# Patient Record
Sex: Female | Born: 1980 | Race: White | Hispanic: No | Marital: Married | Smoking: Current every day smoker
Health system: Southern US, Community
[De-identification: ages and names within clinical notes are randomized; demographics above are authoritative.]

## PROBLEM LIST (undated history)

## (undated) DIAGNOSIS — J45909 Unspecified asthma, uncomplicated: Secondary | ICD-10-CM

## (undated) DIAGNOSIS — Z720 Tobacco use: Secondary | ICD-10-CM

## (undated) DIAGNOSIS — Z9889 Other specified postprocedural states: Secondary | ICD-10-CM

## (undated) DIAGNOSIS — Q796 Ehlers-Danlos syndrome, unspecified: Secondary | ICD-10-CM

## (undated) DIAGNOSIS — M1990 Unspecified osteoarthritis, unspecified site: Secondary | ICD-10-CM

## (undated) DIAGNOSIS — T4145XA Adverse effect of unspecified anesthetic, initial encounter: Secondary | ICD-10-CM

## (undated) DIAGNOSIS — K219 Gastro-esophageal reflux disease without esophagitis: Secondary | ICD-10-CM

## (undated) DIAGNOSIS — T8859XA Other complications of anesthesia, initial encounter: Secondary | ICD-10-CM

## (undated) DIAGNOSIS — K8020 Calculus of gallbladder without cholecystitis without obstruction: Secondary | ICD-10-CM

## (undated) DIAGNOSIS — N12 Tubulo-interstitial nephritis, not specified as acute or chronic: Secondary | ICD-10-CM

## (undated) DIAGNOSIS — F419 Anxiety disorder, unspecified: Secondary | ICD-10-CM

## (undated) DIAGNOSIS — N2889 Other specified disorders of kidney and ureter: Secondary | ICD-10-CM

## (undated) DIAGNOSIS — N200 Calculus of kidney: Secondary | ICD-10-CM

## (undated) DIAGNOSIS — R102 Pelvic and perineal pain unspecified side: Secondary | ICD-10-CM

## (undated) DIAGNOSIS — Z87442 Personal history of urinary calculi: Secondary | ICD-10-CM

## (undated) DIAGNOSIS — N10 Acute tubulo-interstitial nephritis: Secondary | ICD-10-CM

## (undated) DIAGNOSIS — R112 Nausea with vomiting, unspecified: Secondary | ICD-10-CM

## (undated) HISTORY — DX: Unspecified asthma, uncomplicated: J45.909

## (undated) HISTORY — DX: Tobacco use: Z72.0

## (undated) HISTORY — PX: TONSILLECTOMY: SUR1361

## (undated) HISTORY — DX: Ehlers-Danlos syndrome, unspecified: Q79.60

## (undated) HISTORY — PX: CHOLECYSTECTOMY, LAPAROSCOPIC: SHX56

## (undated) HISTORY — DX: Pelvic and perineal pain unspecified side: R10.20

## (undated) HISTORY — PX: LITHOTRIPSY: SUR834

## (undated) HISTORY — DX: Anxiety disorder, unspecified: F41.9

## (undated) HISTORY — DX: Pelvic and perineal pain: R10.2

## (undated) HISTORY — PX: ANKLE ARTHROSCOPY: SUR85

## (undated) HISTORY — DX: Tubulo-interstitial nephritis, not specified as acute or chronic: N12

## (undated) HISTORY — DX: Other specified disorders of kidney and ureter: N28.89

---

## 2004-02-26 HISTORY — PX: ABDOMINAL HYSTERECTOMY: SHX81

## 2005-05-14 ENCOUNTER — Emergency Department: Payer: Self-pay | Admitting: Emergency Medicine

## 2005-07-28 ENCOUNTER — Emergency Department: Payer: Self-pay | Admitting: Emergency Medicine

## 2005-08-01 ENCOUNTER — Emergency Department: Payer: Self-pay | Admitting: General Practice

## 2005-08-28 ENCOUNTER — Emergency Department: Payer: Self-pay | Admitting: Emergency Medicine

## 2005-10-15 ENCOUNTER — Emergency Department: Payer: Self-pay | Admitting: Emergency Medicine

## 2005-10-23 ENCOUNTER — Emergency Department: Payer: Self-pay | Admitting: Emergency Medicine

## 2005-12-26 ENCOUNTER — Emergency Department: Payer: Self-pay | Admitting: Emergency Medicine

## 2006-03-11 ENCOUNTER — Emergency Department: Payer: Self-pay | Admitting: Internal Medicine

## 2006-06-18 ENCOUNTER — Emergency Department: Payer: Self-pay | Admitting: Internal Medicine

## 2006-08-15 ENCOUNTER — Emergency Department: Payer: Self-pay

## 2007-03-10 ENCOUNTER — Emergency Department: Payer: Self-pay | Admitting: Emergency Medicine

## 2007-10-02 ENCOUNTER — Emergency Department: Payer: Self-pay | Admitting: Emergency Medicine

## 2007-10-09 ENCOUNTER — Emergency Department: Payer: Self-pay | Admitting: Emergency Medicine

## 2007-11-09 ENCOUNTER — Emergency Department: Payer: Self-pay | Admitting: Emergency Medicine

## 2007-11-19 ENCOUNTER — Emergency Department (HOSPITAL_COMMUNITY): Admission: EM | Admit: 2007-11-19 | Discharge: 2007-11-19 | Payer: Self-pay | Admitting: Emergency Medicine

## 2008-01-12 ENCOUNTER — Emergency Department: Payer: Self-pay | Admitting: Emergency Medicine

## 2008-03-08 ENCOUNTER — Ambulatory Visit: Payer: Self-pay | Admitting: Urology

## 2008-03-14 ENCOUNTER — Ambulatory Visit: Payer: Self-pay | Admitting: Urology

## 2008-03-17 ENCOUNTER — Ambulatory Visit: Payer: Self-pay | Admitting: Urology

## 2008-05-11 ENCOUNTER — Emergency Department: Payer: Self-pay | Admitting: Emergency Medicine

## 2008-05-18 ENCOUNTER — Ambulatory Visit: Payer: Self-pay | Admitting: Internal Medicine

## 2008-07-16 ENCOUNTER — Ambulatory Visit: Payer: Self-pay | Admitting: Neurology

## 2009-06-20 ENCOUNTER — Ambulatory Visit: Payer: Self-pay | Admitting: Family Medicine

## 2009-09-06 ENCOUNTER — Emergency Department (HOSPITAL_COMMUNITY): Admission: EM | Admit: 2009-09-06 | Discharge: 2009-09-06 | Payer: Self-pay | Admitting: Emergency Medicine

## 2009-09-09 ENCOUNTER — Emergency Department (HOSPITAL_COMMUNITY): Admission: EM | Admit: 2009-09-09 | Discharge: 2009-09-09 | Payer: Self-pay | Admitting: Emergency Medicine

## 2009-09-14 ENCOUNTER — Emergency Department (HOSPITAL_COMMUNITY): Admission: EM | Admit: 2009-09-14 | Discharge: 2009-09-14 | Payer: Self-pay | Admitting: Emergency Medicine

## 2009-09-28 ENCOUNTER — Emergency Department: Payer: Self-pay | Admitting: Internal Medicine

## 2009-11-18 ENCOUNTER — Emergency Department (HOSPITAL_COMMUNITY): Admission: EM | Admit: 2009-11-18 | Discharge: 2009-11-18 | Payer: Self-pay | Admitting: Emergency Medicine

## 2009-11-24 ENCOUNTER — Emergency Department (HOSPITAL_COMMUNITY): Admission: EM | Admit: 2009-11-24 | Discharge: 2009-11-24 | Payer: Self-pay | Admitting: Emergency Medicine

## 2009-11-27 ENCOUNTER — Ambulatory Visit (HOSPITAL_COMMUNITY): Admission: RE | Admit: 2009-11-27 | Discharge: 2009-11-27 | Payer: Self-pay | Admitting: Gastroenterology

## 2009-11-28 ENCOUNTER — Emergency Department (HOSPITAL_COMMUNITY): Admission: EM | Admit: 2009-11-28 | Discharge: 2009-11-28 | Payer: Self-pay | Admitting: Emergency Medicine

## 2009-12-24 ENCOUNTER — Emergency Department (HOSPITAL_COMMUNITY): Admission: EM | Admit: 2009-12-24 | Discharge: 2009-12-25 | Payer: Self-pay | Admitting: Emergency Medicine

## 2010-01-09 ENCOUNTER — Emergency Department: Payer: Self-pay | Admitting: Emergency Medicine

## 2010-01-09 ENCOUNTER — Emergency Department (HOSPITAL_COMMUNITY): Admission: EM | Admit: 2010-01-09 | Discharge: 2010-01-09 | Payer: Self-pay | Admitting: Emergency Medicine

## 2010-01-11 ENCOUNTER — Emergency Department (HOSPITAL_COMMUNITY): Admission: EM | Admit: 2010-01-11 | Discharge: 2010-01-11 | Payer: Self-pay | Admitting: Emergency Medicine

## 2010-01-25 ENCOUNTER — Emergency Department (HOSPITAL_COMMUNITY)
Admission: EM | Admit: 2010-01-25 | Discharge: 2010-01-25 | Payer: Self-pay | Source: Home / Self Care | Admitting: Emergency Medicine

## 2010-02-08 ENCOUNTER — Emergency Department (HOSPITAL_COMMUNITY)
Admission: EM | Admit: 2010-02-08 | Discharge: 2010-02-08 | Payer: Self-pay | Source: Home / Self Care | Admitting: Emergency Medicine

## 2010-03-09 ENCOUNTER — Emergency Department (HOSPITAL_COMMUNITY)
Admission: EM | Admit: 2010-03-09 | Discharge: 2010-03-09 | Payer: Self-pay | Source: Home / Self Care | Admitting: Emergency Medicine

## 2010-05-07 LAB — BASIC METABOLIC PANEL
BUN: 13 mg/dL (ref 6–23)
CO2: 22 mEq/L (ref 19–32)
Calcium: 9.1 mg/dL (ref 8.4–10.5)
Chloride: 118 mEq/L — ABNORMAL HIGH (ref 96–112)
Creatinine, Ser: 0.74 mg/dL (ref 0.4–1.2)
GFR calc Af Amer: >60 mL/min (ref >60)
GFR calc non Af Amer: >60 mL/min (ref >60)
Glucose, Bld: 84 mg/dL (ref 70–99)
Potassium: 3.8 mEq/L (ref 3.5–5.1)
Sodium: 144 mEq/L (ref 135–145)

## 2010-05-08 LAB — POCT I-STAT, CHEM 8
BUN: 6 mg/dL (ref 6–23)
Calcium, Ion: 1.17 mmol/L (ref 1.12–1.32)
Chloride: 109 mEq/L (ref 96–112)
Creatinine, Ser: 0.7 mg/dL (ref 0.4–1.2)
Glucose, Bld: 89 mg/dL (ref 70–99)
HCT: 39 % (ref 36.0–46.0)
Hemoglobin: 13.3 g/dL (ref 12.0–15.0)
Potassium: 3.6 mEq/L (ref 3.5–5.1)
Sodium: 142 mEq/L (ref 135–145)
TCO2: 22 mmol/L (ref 0–100)

## 2010-05-08 LAB — URINALYSIS, ROUTINE W REFLEX MICROSCOPIC
Bilirubin Urine: NEGATIVE
Glucose, UA: NEGATIVE mg/dL
Hgb urine dipstick: NEGATIVE
Ketones, ur: NEGATIVE mg/dL
Nitrite: NEGATIVE
Protein, ur: NEGATIVE mg/dL
Specific Gravity, Urine: 1.005 (ref 1.005–1.030)
Urobilinogen, UA: 0.2 mg/dL (ref 0.0–1.0)
pH: 7 (ref 5.0–8.0)

## 2010-05-08 LAB — POCT PREGNANCY, URINE: Preg Test, Ur: NEGATIVE

## 2010-05-10 LAB — CBC
HCT: 38.8 % (ref 36.0–46.0)
HCT: 40.5 % (ref 36.0–46.0)
HCT: 40.9 % (ref 36.0–46.0)
Hemoglobin: 13.1 g/dL (ref 12.0–15.0)
Hemoglobin: 13.4 g/dL (ref 12.0–15.0)
Hemoglobin: 13.7 g/dL (ref 12.0–15.0)
MCH: 31.2 pg (ref 26.0–34.0)
MCH: 31.6 pg (ref 26.0–34.0)
MCH: 31.8 pg (ref 26.0–34.0)
MCHC: 33.1 g/dL (ref 30.0–36.0)
MCHC: 33.5 g/dL (ref 30.0–36.0)
MCHC: 33.8 g/dL (ref 30.0–36.0)
MCV: 93.7 fL (ref 78.0–100.0)
MCV: 94.2 fL (ref 78.0–100.0)
MCV: 94.9 fL (ref 78.0–100.0)
Platelets: 178 10*3/uL (ref 150–400)
Platelets: 185 10*3/uL (ref 150–400)
Platelets: 194 10*3/uL (ref 150–400)
RBC: 4.14 MIL/uL (ref 3.87–5.11)
RBC: 4.3 MIL/uL (ref 3.87–5.11)
RBC: 4.31 MIL/uL (ref 3.87–5.11)
RDW: 12.8 % (ref 11.5–15.5)
RDW: 12.8 % (ref 11.5–15.5)
RDW: 12.9 % (ref 11.5–15.5)
WBC: 6.1 10*3/uL (ref 4.0–10.5)
WBC: 6.5 10*3/uL (ref 4.0–10.5)
WBC: 7.1 10*3/uL (ref 4.0–10.5)

## 2010-05-10 LAB — COMPREHENSIVE METABOLIC PANEL
ALT: 13 U/L (ref 0–35)
ALT: 14 U/L (ref 0–35)
ALT: 15 U/L (ref 0–35)
AST: 17 U/L (ref 0–37)
AST: 21 U/L (ref 0–37)
AST: 24 U/L (ref 0–37)
Albumin: 3.9 g/dL (ref 3.5–5.2)
Albumin: 3.9 g/dL (ref 3.5–5.2)
Albumin: 4.1 g/dL (ref 3.5–5.2)
Alkaline Phosphatase: 55 U/L (ref 39–117)
Alkaline Phosphatase: 63 U/L (ref 39–117)
Alkaline Phosphatase: 65 U/L (ref 39–117)
BUN: 10 mg/dL (ref 6–23)
BUN: 11 mg/dL (ref 6–23)
BUN: 13 mg/dL (ref 6–23)
CO2: 20 mEq/L (ref 19–32)
CO2: 22 mEq/L (ref 19–32)
CO2: 23 mEq/L (ref 19–32)
Calcium: 8.9 mg/dL (ref 8.4–10.5)
Calcium: 9.4 mg/dL (ref 8.4–10.5)
Calcium: 9.6 mg/dL (ref 8.4–10.5)
Chloride: 113 mEq/L — ABNORMAL HIGH (ref 96–112)
Chloride: 115 mEq/L — ABNORMAL HIGH (ref 96–112)
Chloride: 116 mEq/L — ABNORMAL HIGH (ref 96–112)
Creatinine, Ser: 0.66 mg/dL (ref 0.4–1.2)
Creatinine, Ser: 0.7 mg/dL (ref 0.4–1.2)
Creatinine, Ser: 0.74 mg/dL (ref 0.4–1.2)
GFR calc Af Amer: >60 mL/min (ref >60)
GFR calc Af Amer: >60 mL/min (ref >60)
GFR calc Af Amer: >60 mL/min (ref >60)
GFR calc non Af Amer: >60 mL/min (ref >60)
GFR calc non Af Amer: >60 mL/min (ref >60)
GFR calc non Af Amer: >60 mL/min (ref >60)
Glucose, Bld: 72 mg/dL (ref 70–99)
Glucose, Bld: 75 mg/dL (ref 70–99)
Glucose, Bld: 84 mg/dL (ref 70–99)
Potassium: 3.5 mEq/L (ref 3.5–5.1)
Potassium: 3.8 mEq/L (ref 3.5–5.1)
Potassium: 3.9 mEq/L (ref 3.5–5.1)
Sodium: 139 mEq/L (ref 135–145)
Sodium: 140 mEq/L (ref 135–145)
Sodium: 142 mEq/L (ref 135–145)
Total Bilirubin: 0.2 mg/dL — ABNORMAL LOW (ref 0.3–1.2)
Total Bilirubin: 0.3 mg/dL (ref 0.3–1.2)
Total Bilirubin: 0.5 mg/dL (ref 0.3–1.2)
Total Protein: 6.9 g/dL (ref 6.0–8.3)
Total Protein: 7 g/dL (ref 6.0–8.3)
Total Protein: 7.2 g/dL (ref 6.0–8.3)

## 2010-05-10 LAB — DIFFERENTIAL
Basophils Absolute: 0.1 10*3/uL (ref 0.0–0.1)
Basophils Absolute: 0.1 10*3/uL (ref 0.0–0.1)
Basophils Absolute: 0.1 10*3/uL (ref 0.0–0.1)
Basophils Relative: 1 % (ref 0–1)
Basophils Relative: 1 % (ref 0–1)
Basophils Relative: 1 % (ref 0–1)
Eosinophils Absolute: 0.1 10*3/uL (ref 0.0–0.7)
Eosinophils Absolute: 0.1 10*3/uL (ref 0.0–0.7)
Eosinophils Absolute: 0.1 10*3/uL (ref 0.0–0.7)
Eosinophils Relative: 2 % (ref 0–5)
Eosinophils Relative: 2 % (ref 0–5)
Eosinophils Relative: 2 % (ref 0–5)
Lymphocytes Relative: 31 % (ref 12–46)
Lymphocytes Relative: 34 % (ref 12–46)
Lymphocytes Relative: 37 % (ref 12–46)
Lymphs Abs: 1.9 10*3/uL (ref 0.7–4.0)
Lymphs Abs: 2.4 10*3/uL (ref 0.7–4.0)
Lymphs Abs: 2.4 10*3/uL (ref 0.7–4.0)
Monocytes Absolute: 0.4 10*3/uL (ref 0.1–1.0)
Monocytes Absolute: 0.5 10*3/uL (ref 0.1–1.0)
Monocytes Absolute: 0.5 10*3/uL (ref 0.1–1.0)
Monocytes Relative: 6 % (ref 3–12)
Monocytes Relative: 7 % (ref 3–12)
Monocytes Relative: 7 % (ref 3–12)
Neutro Abs: 3.5 10*3/uL (ref 1.7–7.7)
Neutro Abs: 3.7 10*3/uL (ref 1.7–7.7)
Neutro Abs: 4 10*3/uL (ref 1.7–7.7)
Neutrophils Relative %: 53 % (ref 43–77)
Neutrophils Relative %: 57 % (ref 43–77)
Neutrophils Relative %: 61 % (ref 43–77)

## 2010-05-10 LAB — URINALYSIS, ROUTINE W REFLEX MICROSCOPIC
Bilirubin Urine: NEGATIVE
Bilirubin Urine: NEGATIVE
Glucose, UA: NEGATIVE mg/dL
Glucose, UA: NEGATIVE mg/dL
Hgb urine dipstick: NEGATIVE
Ketones, ur: NEGATIVE mg/dL
Ketones, ur: NEGATIVE mg/dL
Leukocytes, UA: NEGATIVE
Nitrite: NEGATIVE
Nitrite: NEGATIVE
Protein, ur: NEGATIVE mg/dL
Protein, ur: NEGATIVE mg/dL
Specific Gravity, Urine: 1.008 (ref 1.005–1.030)
Specific Gravity, Urine: 1.015 (ref 1.005–1.030)
Urobilinogen, UA: 0.2 mg/dL (ref 0.0–1.0)
Urobilinogen, UA: 1 mg/dL (ref 0.0–1.0)
pH: 7 (ref 5.0–8.0)
pH: 7.5 (ref 5.0–8.0)

## 2010-05-10 LAB — URINE MICROSCOPIC-ADD ON

## 2010-05-10 LAB — LIPASE, BLOOD
Lipase: 30 U/L (ref 11–59)
Lipase: 34 U/L (ref 11–59)

## 2010-05-10 LAB — POCT PREGNANCY, URINE: Preg Test, Ur: NEGATIVE

## 2010-06-10 ENCOUNTER — Emergency Department (HOSPITAL_COMMUNITY): Payer: BC Managed Care – HMO

## 2010-06-10 ENCOUNTER — Emergency Department (HOSPITAL_COMMUNITY)
Admission: EM | Admit: 2010-06-10 | Discharge: 2010-06-11 | Disposition: A | Discharge status: Home / Self Care | Payer: BC Managed Care – HMO | Attending: Emergency Medicine | Admitting: Emergency Medicine

## 2010-06-10 DIAGNOSIS — R110 Nausea: Secondary | ICD-10-CM | POA: Insufficient documentation

## 2010-06-10 DIAGNOSIS — J45909 Unspecified asthma, uncomplicated: Secondary | ICD-10-CM | POA: Insufficient documentation

## 2010-06-10 DIAGNOSIS — N201 Calculus of ureter: Secondary | ICD-10-CM | POA: Insufficient documentation

## 2010-06-10 DIAGNOSIS — R319 Hematuria, unspecified: Secondary | ICD-10-CM | POA: Insufficient documentation

## 2010-06-10 DIAGNOSIS — R109 Unspecified abdominal pain: Secondary | ICD-10-CM | POA: Insufficient documentation

## 2010-06-10 LAB — URINALYSIS, ROUTINE W REFLEX MICROSCOPIC
Glucose, UA: NEGATIVE mg/dL
Ketones, ur: 15 mg/dL — AB
Nitrite: NEGATIVE
Protein, ur: 100 mg/dL — AB
Specific Gravity, Urine: 1.024 (ref 1.005–1.030)
Urobilinogen, UA: 0.2 mg/dL (ref 0.0–1.0)
pH: 5 (ref 5.0–8.0)

## 2010-06-10 LAB — POCT I-STAT, CHEM 8
BUN: 11 mg/dL (ref 6–23)
Calcium, Ion: 1.26 mmol/L (ref 1.12–1.32)
Chloride: 109 mEq/L (ref 96–112)
Creatinine, Ser: 0.9 mg/dL (ref 0.4–1.2)
Glucose, Bld: 85 mg/dL (ref 70–99)
HCT: 41 % (ref 36.0–46.0)
Hemoglobin: 13.9 g/dL (ref 12.0–15.0)
Potassium: 3.5 mEq/L (ref 3.5–5.1)
Sodium: 141 mEq/L (ref 135–145)
TCO2: 22 mmol/L (ref 0–100)

## 2010-06-10 LAB — URINE MICROSCOPIC-ADD ON

## 2010-06-14 ENCOUNTER — Emergency Department (HOSPITAL_COMMUNITY)
Admission: EM | Admit: 2010-06-14 | Discharge: 2010-06-15 | Disposition: A | Discharge status: Other Acute Inpatient Hospital | Payer: BC Managed Care – HMO | Attending: Emergency Medicine | Admitting: Emergency Medicine

## 2010-06-14 DIAGNOSIS — N200 Calculus of kidney: Secondary | ICD-10-CM | POA: Insufficient documentation

## 2010-06-14 DIAGNOSIS — R110 Nausea: Secondary | ICD-10-CM | POA: Insufficient documentation

## 2010-06-14 DIAGNOSIS — Z79899 Other long term (current) drug therapy: Secondary | ICD-10-CM | POA: Insufficient documentation

## 2010-06-14 DIAGNOSIS — R109 Unspecified abdominal pain: Secondary | ICD-10-CM | POA: Insufficient documentation

## 2010-06-14 DIAGNOSIS — J45909 Unspecified asthma, uncomplicated: Secondary | ICD-10-CM | POA: Insufficient documentation

## 2010-06-14 LAB — URINALYSIS, ROUTINE W REFLEX MICROSCOPIC
Bilirubin Urine: NEGATIVE
Glucose, UA: NEGATIVE mg/dL
Ketones, ur: NEGATIVE mg/dL
Leukocytes, UA: NEGATIVE
Nitrite: NEGATIVE
Protein, ur: NEGATIVE mg/dL
Specific Gravity, Urine: 1.013 (ref 1.005–1.030)
Urobilinogen, UA: 1 mg/dL (ref 0.0–1.0)
pH: 8.5 — ABNORMAL HIGH (ref 5.0–8.0)

## 2010-06-14 LAB — POCT PREGNANCY, URINE: Preg Test, Ur: NEGATIVE

## 2010-06-14 LAB — URINE MICROSCOPIC-ADD ON

## 2010-06-15 ENCOUNTER — Inpatient Hospital Stay (HOSPITAL_COMMUNITY): Payer: BC Managed Care – HMO

## 2010-06-15 ENCOUNTER — Inpatient Hospital Stay (HOSPITAL_COMMUNITY)
Admission: AD | Admit: 2010-06-15 | Discharge: 2010-06-16 | DRG: 324 | Disposition: A | Discharge status: Home / Self Care | Payer: BC Managed Care – HMO | Source: Other Acute Inpatient Hospital | Attending: Urology | Admitting: Urology

## 2010-06-15 DIAGNOSIS — J45909 Unspecified asthma, uncomplicated: Secondary | ICD-10-CM | POA: Diagnosis present

## 2010-06-15 DIAGNOSIS — Z87442 Personal history of urinary calculi: Secondary | ICD-10-CM

## 2010-06-15 DIAGNOSIS — F411 Generalized anxiety disorder: Secondary | ICD-10-CM | POA: Diagnosis present

## 2010-06-15 DIAGNOSIS — N201 Calculus of ureter: Principal | ICD-10-CM | POA: Diagnosis present

## 2010-06-15 DIAGNOSIS — N200 Calculus of kidney: Secondary | ICD-10-CM | POA: Diagnosis present

## 2010-06-15 LAB — URINE MICROSCOPIC-ADD ON

## 2010-06-15 LAB — URINALYSIS, ROUTINE W REFLEX MICROSCOPIC
Bilirubin Urine: NEGATIVE
Glucose, UA: NEGATIVE mg/dL
Ketones, ur: NEGATIVE mg/dL
Leukocytes, UA: NEGATIVE
Nitrite: NEGATIVE
Protein, ur: NEGATIVE mg/dL
Specific Gravity, Urine: 1.006 (ref 1.005–1.030)
Urobilinogen, UA: 1 mg/dL (ref 0.0–1.0)
pH: 7 (ref 5.0–8.0)

## 2010-06-15 LAB — CBC
HCT: 37.3 % (ref 36.0–46.0)
Hemoglobin: 12.4 g/dL (ref 12.0–15.0)
MCH: 31.2 pg (ref 26.0–34.0)
MCHC: 33.2 g/dL (ref 30.0–36.0)
MCV: 93.7 fL (ref 78.0–100.0)
Platelets: 196 10*3/uL (ref 150–400)
RBC: 3.98 MIL/uL (ref 3.87–5.11)
RDW: 12.5 % (ref 11.5–15.5)
WBC: 7.7 10*3/uL (ref 4.0–10.5)

## 2010-06-15 LAB — BASIC METABOLIC PANEL
BUN: 6 mg/dL (ref 6–23)
CO2: 24 mEq/L (ref 19–32)
Calcium: 8.6 mg/dL (ref 8.4–10.5)
Chloride: 114 mEq/L — ABNORMAL HIGH (ref 96–112)
Creatinine, Ser: 0.71 mg/dL (ref 0.4–1.2)
GFR calc Af Amer: >60 mL/min (ref >60)
GFR calc non Af Amer: >60 mL/min (ref >60)
Glucose, Bld: 84 mg/dL (ref 70–99)
Potassium: 3.9 mEq/L (ref 3.5–5.1)
Sodium: 143 mEq/L (ref 135–145)

## 2010-06-15 LAB — SURGICAL PCR SCREEN
MRSA, PCR: NEGATIVE
Staphylococcus aureus: NEGATIVE

## 2010-06-21 ENCOUNTER — Emergency Department (HOSPITAL_COMMUNITY): Payer: BC Managed Care – PPO

## 2010-06-21 ENCOUNTER — Emergency Department (HOSPITAL_COMMUNITY)
Admission: EM | Admit: 2010-06-21 | Discharge: 2010-06-21 | Disposition: A | Discharge status: Home / Self Care | Payer: BC Managed Care – PPO | Attending: Emergency Medicine | Admitting: Emergency Medicine

## 2010-06-21 DIAGNOSIS — R109 Unspecified abdominal pain: Secondary | ICD-10-CM | POA: Insufficient documentation

## 2010-06-21 DIAGNOSIS — N390 Urinary tract infection, site not specified: Secondary | ICD-10-CM | POA: Insufficient documentation

## 2010-06-21 DIAGNOSIS — N200 Calculus of kidney: Secondary | ICD-10-CM | POA: Insufficient documentation

## 2010-06-21 DIAGNOSIS — Z9889 Other specified postprocedural states: Secondary | ICD-10-CM | POA: Insufficient documentation

## 2010-06-21 DIAGNOSIS — N201 Calculus of ureter: Secondary | ICD-10-CM | POA: Insufficient documentation

## 2010-06-21 LAB — URINALYSIS, ROUTINE W REFLEX MICROSCOPIC
Bilirubin Urine: NEGATIVE
Glucose, UA: NEGATIVE mg/dL
Nitrite: POSITIVE — AB
Protein, ur: 100 mg/dL — AB
Specific Gravity, Urine: 1.019 (ref 1.005–1.030)
Urobilinogen, UA: 1 mg/dL (ref 0.0–1.0)
pH: 8 (ref 5.0–8.0)

## 2010-06-21 LAB — CBC
HCT: 36.9 % (ref 36.0–46.0)
Hemoglobin: 11.9 g/dL — ABNORMAL LOW (ref 12.0–15.0)
MCH: 30.4 pg (ref 26.0–34.0)
MCHC: 32.2 g/dL (ref 30.0–36.0)
MCV: 94.4 fL (ref 78.0–100.0)
Platelets: 192 10*3/uL (ref 150–400)
RBC: 3.91 MIL/uL (ref 3.87–5.11)
RDW: 12.8 % (ref 11.5–15.5)
WBC: 8.1 10*3/uL (ref 4.0–10.5)

## 2010-06-21 LAB — DIFFERENTIAL
Basophils Absolute: 0.1 10*3/uL (ref 0.0–0.1)
Basophils Relative: 1 % (ref 0–1)
Eosinophils Absolute: 0.3 10*3/uL (ref 0.0–0.7)
Eosinophils Relative: 3 % (ref 0–5)
Lymphocytes Relative: 36 % (ref 12–46)
Lymphs Abs: 2.9 10*3/uL (ref 0.7–4.0)
Monocytes Absolute: 0.5 10*3/uL (ref 0.1–1.0)
Monocytes Relative: 6 % (ref 3–12)
Neutro Abs: 4.4 10*3/uL (ref 1.7–7.7)
Neutrophils Relative %: 54 % (ref 43–77)

## 2010-06-21 LAB — POCT I-STAT, CHEM 8
BUN: 11 mg/dL (ref 6–23)
Calcium, Ion: 1.28 mmol/L (ref 1.12–1.32)
Chloride: 110 mEq/L (ref 96–112)
Creatinine, Ser: 1 mg/dL (ref 0.4–1.2)
Glucose, Bld: 109 mg/dL — ABNORMAL HIGH (ref 70–99)
HCT: 46 % (ref 36.0–46.0)
Hemoglobin: 15.6 g/dL — ABNORMAL HIGH (ref 12.0–15.0)
Potassium: 3.8 mEq/L (ref 3.5–5.1)
Sodium: 142 mEq/L (ref 135–145)
TCO2: 21 mmol/L (ref 0–100)

## 2010-06-21 LAB — URINE MICROSCOPIC-ADD ON

## 2010-06-22 NOTE — Op Note (Signed)
  NAMEZARAYA, DELAUDER            ACCOUNT NO.:  [id number]  MEDICAL RECORD NO.:  [id number]           PATIENT TYPE:  I  LOCATION:  1538                         FACILITY:  [facility]  PHYSICIAN:  Rhiana Morash I. Patsi Sears, M.D.DATE OF BIRTH:  1980/05/06  DATE OF PROCEDURE: DATE OF DISCHARGE:  06/15/2010                              OPERATIVE REPORT   PREOPERATIVE DIAGNOSIS:  Left ureteropelvic junction stone.  POSTOPERATIVE DIAGNOSIS:  Left ureteropelvic junction stone.  OPERATIONS:  Left ureteroscopy, left double-J stent, in addition attempted right retrograde pyelogram.  PREPARATION:  After appropriate preanesthesia, the patient was brought to the operating room and placed on the operating table in the dorsal supine position where general LMA anesthesia was induced.  She was then replaced in dorsal lithotomy position where pubis was prepped with Betadine solution and draped in usual fashion.  The left arm was marked. Cystoscopy was accomplished and left retrograde pyelogram was accomplished.  We felt that a stone might be identified in the left lower ureter and, therefore, ureteroscopy was accomplished.  The entire left ureter was scoped but I could not find the stone.  Retrograde pyelogram was also performed, and I was unsure if there was a stone in the renal pelvis.  Indigo carmine was given and a double-J catheter was passed, 6-French x 24 cm.  Because I did not found stone on the left side and because the patient has a history of bilateral stones, I decided to do retrograde pyelogram on the right side.  It is noted that she felt that she passed the stone from the right side recently.  Retrograde pyelogram could not be performed on the right side because I could never find the right ureter.  The orifice was identified but I could never pass the guidewire into the left ureter.  After multiple attempts, I elected to stop.  The patient was awakened, given a B and O suppository, and  taken to the recovery room in good condition.     Jamyson Jirak I. Patsi Sears, M.D.     SIT/MEDQ  D:  06/15/2010  T:  06/15/2010  Job:  [id number]  Electronically Signed by Jethro Bolus M.D. on 06/22/2010 02:25:32 PM

## 2010-06-23 NOTE — Consult Note (Signed)
  [name], [name]            ACCOUNT NO.:  [id number]  MEDICAL RECORD NO.:  [id number]           PATIENT TYPE:  I  LOCATION:  1538                         FACILITY:  [facility]  PHYSICIAN:  Barnie Del, M.D.  DATE OF BIRTH:  01-01-1981  DATE OF CONSULTATION: DATE OF DISCHARGE:                                CONSULTATION   CHIEF COMPLAINT:  Left flank pain.  HISTORY OF PRESENT ILLNESS:  The patient is a 30 year old female who was seen in the emergency room on June 11, 2010, with right-sided abdominal pain.  A CT scan showed a 4-mm stone in the right distal ureter, a 4-mm stone in the mid pole of the left kidney, and a 4-mm stone at the left UPJ.  She was seen that day by Dr. Patsi Sears in the office and was treated with medical expulsive therapy with tamsulosin.  She passed the small right distal ureteral stone.  She returned to the emergency room last night with severe left flank pain associated with nausea and vomiting.  She was then given IV analgesics and she continued to have the same symptoms.  She was then admitted for pain control and double-J stent insertion or stone manipulation.  She has a past history of kidney stone and had lithotripsy on both sides in the past.  PAST MEDICAL HISTORY:  Positive for anxiety, asthma, kidney stone and esophageal reflux.  PAST SURGICAL HISTORY:  She had hysterectomy, tonsillectomy and ESL in the past.  SOCIAL HISTORY:  She is married, has 2 children, is a social drinker.  MEDICATIONS:  She is on Neurontin, Paxil, Topamax, phentermine, vitamin D.  ALLERGIES:  She is allergic to COMPAZINE and REGLAN.  FAMILY HISTORY:  Positive for diabetes, hypertension.  Her father has history of kidney stone and pulmonary fibrosis.  REVIEW OF SYSTEMS:  As noted in the HPI and everything else is negative.  PHYSICAL EXAMINATION:  GENERAL:  On physical examination, this is a well- developed 30 year old female who is still complaining of left  flank pain.  She is alert and oriented to time, place and person. VITAL SIGNS:  Blood pressure is 108/73, pulse 66, respirations 20, temperature 98.2. SKIN:  Warm and dry. HEENT:  Her head is normal.  She has pink conjunctivae.  Ears, nose and throat are within normal limits. NECK:  Supple.  She has no cervical adenopathy.  No thyromegaly. LUNGS:  She has no labored breathing. ABDOMEN:  Soft.  She has left CVA tenderness.  Kidneys are not palpable. Bladder is not distended.  She has no inguinal adenopathy. PELVIC/RECTAL:  Examinations are deferred.  IMPRESSION:  Left renal calculus, left ureteropelvic junction calculus, passed right ureteral stone.  PLAN:  Continue IV analgesics and insertion of double-J stent.  I will inform Dr. Patsi Sears of the patient's admission and he will follow her up.     Danae Chen, M.D.     MN/MEDQ  D:  06/15/2010  T:  06/15/2010  Job:  [id number]  Electronically Signed by Lindaann Slough M.D. on 06/23/2010 11:06:42 AM

## 2010-06-25 ENCOUNTER — Ambulatory Visit (HOSPITAL_COMMUNITY): Payer: BC Managed Care – PPO

## 2010-06-25 ENCOUNTER — Ambulatory Visit (HOSPITAL_COMMUNITY)
Admission: RE | Admit: 2010-06-25 | Discharge: 2010-06-25 | Disposition: A | Discharge status: Home / Self Care | Payer: BC Managed Care – PPO | Source: Ambulatory Visit | Attending: Urology | Admitting: Urology

## 2010-06-25 DIAGNOSIS — Z01812 Encounter for preprocedural laboratory examination: Secondary | ICD-10-CM | POA: Insufficient documentation

## 2010-06-25 DIAGNOSIS — N201 Calculus of ureter: Secondary | ICD-10-CM | POA: Insufficient documentation

## 2010-06-25 DIAGNOSIS — J45909 Unspecified asthma, uncomplicated: Secondary | ICD-10-CM | POA: Insufficient documentation

## 2010-06-25 DIAGNOSIS — N200 Calculus of kidney: Secondary | ICD-10-CM | POA: Insufficient documentation

## 2010-06-29 ENCOUNTER — Emergency Department (HOSPITAL_COMMUNITY)
Admission: EM | Admit: 2010-06-29 | Discharge: 2010-06-29 | Disposition: A | Discharge status: Home / Self Care | Payer: BC Managed Care – PPO | Attending: Emergency Medicine | Admitting: Emergency Medicine

## 2010-06-29 DIAGNOSIS — R300 Dysuria: Secondary | ICD-10-CM | POA: Insufficient documentation

## 2010-06-29 DIAGNOSIS — R112 Nausea with vomiting, unspecified: Secondary | ICD-10-CM | POA: Insufficient documentation

## 2010-06-29 DIAGNOSIS — Z87442 Personal history of urinary calculi: Secondary | ICD-10-CM | POA: Insufficient documentation

## 2010-06-29 DIAGNOSIS — R319 Hematuria, unspecified: Secondary | ICD-10-CM | POA: Insufficient documentation

## 2010-06-29 DIAGNOSIS — Z9889 Other specified postprocedural states: Secondary | ICD-10-CM | POA: Insufficient documentation

## 2010-06-29 LAB — POCT I-STAT, CHEM 8
BUN: 13 mg/dL (ref 6–23)
Calcium, Ion: 1.19 mmol/L (ref 1.12–1.32)
Chloride: 110 mEq/L (ref 96–112)
Creatinine, Ser: 0.9 mg/dL (ref 0.4–1.2)
Glucose, Bld: 84 mg/dL (ref 70–99)
HCT: 39 % (ref 36.0–46.0)
Hemoglobin: 13.3 g/dL (ref 12.0–15.0)
Potassium: 3.4 mEq/L — ABNORMAL LOW (ref 3.5–5.1)
Sodium: 143 mEq/L (ref 135–145)
TCO2: 23 mmol/L (ref 0–100)

## 2010-06-29 LAB — URINALYSIS, ROUTINE W REFLEX MICROSCOPIC
Bilirubin Urine: NEGATIVE
Glucose, UA: NEGATIVE mg/dL
Nitrite: NEGATIVE
Protein, ur: >300 mg/dL — AB
Specific Gravity, Urine: 1.018 (ref 1.005–1.030)
Urobilinogen, UA: 1 mg/dL (ref 0.0–1.0)
pH: 7.5 (ref 5.0–8.0)

## 2010-06-29 LAB — URINE MICROSCOPIC-ADD ON

## 2010-06-29 LAB — POCT PREGNANCY, URINE: Preg Test, Ur: NEGATIVE

## 2010-06-30 LAB — URINE CULTURE
Colony Count: 7000
Culture  Setup Time: 201205040901

## 2010-07-27 NOTE — Discharge Summary (Signed)
  NAMEGLORI, MACHNIK            ACCOUNT NO.:  [id number]  MEDICAL RECORD NO.:  [id number]           PATIENT TYPE:  I  LOCATION:  1538                         FACILITY:  [facility]  PHYSICIAN:  Jaylnn Ullery I. Patsi Sears, M.D.DATE OF BIRTH:  [birth year]  DATE OF ADMISSION:  06/15/2010 DATE OF DISCHARGE:  06/16/2010                              DISCHARGE SUMMARY   FINAL DIAGNOSIS:  Left ureteropelvic junction stone.  OPERATIONS:  Cystoscopy, ureteroscopy, left double-J stent, and attempted right retrograde pyelogram.  HISTORY:  Ms. Carneal is a 30 year old female, with left flank pain, nausea and vomiting of sudden onset.  She has a past history of kidney stones.  She is status post right distal ureteral stone excision.  CT now shows a left UPJ stone with mild hydronephrosis.  PAST HISTORY:  Significant for kidney stones.  FAMILY HISTORY:  Significant for diabetes, hypertension, and nephrolithiasis.  SOCIAL HISTORY:  Married, with 2 children.  ALLERGIES:  COMPAZINE, REGLAN.  MEDICATION:  Include Neurontin, Paxil, Tagamet, phentermine and vitamin D.  PHYSICAL EXAM:  Is as noted in the H and P of June 15, 2010.  On June 16, 2010, the patient underwent cystourethroscopy, left retrograde pyelogram, left double-J stent with attempt at ureteroscopy and recovery of the stone.  However, the stone was too high and was flushed in the kidney.  The patient was observed overnight, allowed to be discharged the following morning in excellent condition.     Iokepa Geffre I. Patsi Sears, M.D.     SIT/MEDQ  D:  07/10/2010  T:  07/10/2010  Job:  [id number]  Electronically Signed by Jethro Bolus M.D. on 07/27/2010 01:22:33 PM

## 2010-09-01 ENCOUNTER — Emergency Department (HOSPITAL_COMMUNITY)
Admission: EM | Admit: 2010-09-01 | Discharge: 2010-09-02 | Disposition: A | Discharge status: Home / Self Care | Payer: BC Managed Care – PPO | Attending: Emergency Medicine | Admitting: Emergency Medicine

## 2010-09-01 DIAGNOSIS — Z87442 Personal history of urinary calculi: Secondary | ICD-10-CM | POA: Insufficient documentation

## 2010-09-01 DIAGNOSIS — J45909 Unspecified asthma, uncomplicated: Secondary | ICD-10-CM | POA: Insufficient documentation

## 2010-09-01 DIAGNOSIS — G43909 Migraine, unspecified, not intractable, without status migrainosus: Secondary | ICD-10-CM | POA: Insufficient documentation

## 2010-09-01 DIAGNOSIS — Z888 Allergy status to other drugs, medicaments and biological substances status: Secondary | ICD-10-CM | POA: Insufficient documentation

## 2010-09-21 ENCOUNTER — Emergency Department (HOSPITAL_COMMUNITY)
Admission: EM | Admit: 2010-09-21 | Discharge: 2010-09-21 | Disposition: A | Discharge status: Home / Self Care | Payer: BC Managed Care – PPO | Attending: Emergency Medicine | Admitting: Emergency Medicine

## 2010-09-21 DIAGNOSIS — R110 Nausea: Secondary | ICD-10-CM | POA: Insufficient documentation

## 2010-09-21 DIAGNOSIS — Z79899 Other long term (current) drug therapy: Secondary | ICD-10-CM | POA: Insufficient documentation

## 2010-09-21 DIAGNOSIS — G43909 Migraine, unspecified, not intractable, without status migrainosus: Secondary | ICD-10-CM | POA: Insufficient documentation

## 2010-09-21 DIAGNOSIS — J45909 Unspecified asthma, uncomplicated: Secondary | ICD-10-CM | POA: Insufficient documentation

## 2010-09-21 DIAGNOSIS — H53149 Visual discomfort, unspecified: Secondary | ICD-10-CM | POA: Insufficient documentation

## 2010-11-14 ENCOUNTER — Emergency Department (HOSPITAL_COMMUNITY)
Admission: EM | Admit: 2010-11-14 | Discharge: 2010-11-14 | Disposition: A | Discharge status: Home / Self Care | Payer: BC Managed Care – PPO | Attending: Emergency Medicine | Admitting: Emergency Medicine

## 2010-11-14 DIAGNOSIS — G43909 Migraine, unspecified, not intractable, without status migrainosus: Secondary | ICD-10-CM | POA: Insufficient documentation

## 2010-11-22 ENCOUNTER — Emergency Department (HOSPITAL_COMMUNITY)
Admission: EM | Admit: 2010-11-22 | Discharge: 2010-11-22 | Disposition: A | Discharge status: Home / Self Care | Payer: BC Managed Care – PPO | Attending: Emergency Medicine | Admitting: Emergency Medicine

## 2010-11-22 DIAGNOSIS — Z79899 Other long term (current) drug therapy: Secondary | ICD-10-CM | POA: Insufficient documentation

## 2010-11-22 DIAGNOSIS — R197 Diarrhea, unspecified: Secondary | ICD-10-CM | POA: Insufficient documentation

## 2010-11-22 DIAGNOSIS — H53149 Visual discomfort, unspecified: Secondary | ICD-10-CM | POA: Insufficient documentation

## 2010-11-22 DIAGNOSIS — R51 Headache: Secondary | ICD-10-CM | POA: Insufficient documentation

## 2010-11-22 DIAGNOSIS — R112 Nausea with vomiting, unspecified: Secondary | ICD-10-CM | POA: Insufficient documentation

## 2010-11-23 ENCOUNTER — Emergency Department (HOSPITAL_COMMUNITY)
Admission: EM | Admit: 2010-11-23 | Discharge: 2010-11-23 | Disposition: A | Discharge status: Home / Self Care | Payer: BC Managed Care – PPO | Attending: Emergency Medicine | Admitting: Emergency Medicine

## 2010-11-23 DIAGNOSIS — J45909 Unspecified asthma, uncomplicated: Secondary | ICD-10-CM | POA: Insufficient documentation

## 2010-11-23 DIAGNOSIS — H53149 Visual discomfort, unspecified: Secondary | ICD-10-CM | POA: Insufficient documentation

## 2010-11-23 DIAGNOSIS — R110 Nausea: Secondary | ICD-10-CM | POA: Insufficient documentation

## 2010-11-23 DIAGNOSIS — G43909 Migraine, unspecified, not intractable, without status migrainosus: Secondary | ICD-10-CM | POA: Insufficient documentation

## 2010-11-26 LAB — URINALYSIS, ROUTINE W REFLEX MICROSCOPIC
Bilirubin Urine: NEGATIVE
Glucose, UA: NEGATIVE
Ketones, ur: 15 — AB
Nitrite: POSITIVE — AB
Protein, ur: NEGATIVE
Specific Gravity, Urine: 1.025
Urobilinogen, UA: 1
pH: 7.5

## 2010-11-26 LAB — URINE MICROSCOPIC-ADD ON

## 2010-11-26 LAB — URINE CULTURE: Colony Count: 25000

## 2011-01-19 ENCOUNTER — Encounter: Payer: Self-pay | Admitting: *Deleted

## 2011-01-19 ENCOUNTER — Emergency Department (HOSPITAL_COMMUNITY)
Admission: EM | Admit: 2011-01-19 | Discharge: 2011-01-19 | Disposition: A | Discharge status: Home / Self Care | Payer: BC Managed Care – PPO | Attending: Emergency Medicine | Admitting: Emergency Medicine

## 2011-01-19 DIAGNOSIS — R112 Nausea with vomiting, unspecified: Secondary | ICD-10-CM | POA: Insufficient documentation

## 2011-01-19 DIAGNOSIS — G43909 Migraine, unspecified, not intractable, without status migrainosus: Secondary | ICD-10-CM | POA: Insufficient documentation

## 2011-01-19 DIAGNOSIS — H53149 Visual discomfort, unspecified: Secondary | ICD-10-CM | POA: Insufficient documentation

## 2011-01-19 DIAGNOSIS — Z79899 Other long term (current) drug therapy: Secondary | ICD-10-CM | POA: Insufficient documentation

## 2011-01-19 DIAGNOSIS — H538 Other visual disturbances: Secondary | ICD-10-CM | POA: Insufficient documentation

## 2011-01-19 MED ORDER — DEXAMETHASONE SODIUM PHOSPHATE 10 MG/ML IJ SOLN
10.0000 mg | Freq: Once | INTRAMUSCULAR | Status: AC
  Administered 2011-01-19: 10 mg via INTRAVENOUS

## 2011-01-19 MED ORDER — HYDROMORPHONE HCL PF 1 MG/ML IJ SOLN
1.0000 mg | Freq: Once | INTRAMUSCULAR | Status: AC
  Administered 2011-01-19: 1 mg via INTRAVENOUS
  Filled 2011-01-19: qty 1

## 2011-01-19 MED ORDER — ONDANSETRON 4 MG PO TBDP
8.0000 mg | ORAL_TABLET | Freq: Once | ORAL | Status: AC
  Administered 2011-01-19: 8 mg via ORAL
  Filled 2011-01-19: qty 2

## 2011-01-19 MED ORDER — PROMETHAZINE HCL 25 MG/ML IJ SOLN
25.0000 mg | Freq: Once | INTRAMUSCULAR | Status: AC
  Administered 2011-01-19: 25 mg via INTRAVENOUS
  Filled 2011-01-19: qty 1

## 2011-01-19 MED ORDER — DIPHENHYDRAMINE HCL 50 MG/ML IJ SOLN
25.0000 mg | Freq: Once | INTRAMUSCULAR | Status: AC
  Administered 2011-01-19: 25 mg via INTRAVENOUS

## 2011-01-19 MED ORDER — METOCLOPRAMIDE HCL 5 MG/ML IJ SOLN: 10.0000 mg | Freq: Once | INTRAMUSCULAR | Status: DC

## 2011-01-19 MED ORDER — SODIUM CHLORIDE 0.9 % IV BOLUS (SEPSIS)
1000.0000 mL | Freq: Once | INTRAVENOUS | Status: AC
  Administered 2011-01-19: 1000 mL via INTRAVENOUS

## 2011-01-19 NOTE — ED Notes (Signed)
Pt c/o severe HA onset 21:30 on 11/23, pt sensitive to light, pt hx of migraines pt states, "This HA is not like my others. The others are gradual,  But this is sudden."

## 2011-01-19 NOTE — ED Notes (Signed)
Pt arrived to bed PTO8 from triage - states she has a terrible headache - onset at 0930 this pm.  Has history of migraines.  Was given zofran per protocol at triage.  No further vomiting at this time. Pt resting quietly on stretcher - awaitring MD

## 2011-01-19 NOTE — ED Provider Notes (Signed)
History     CSN: [id number] Arrival date & time: 01/19/2011  1:12 AM   First MD Initiated Contact with Patient 01/19/11 [phone]      Chief Complaint  Patient presents with  . Migraine    (Consider location/radiation/quality/duration/timing/severity/associated sxs/prior treatment) HPI This is a 30 year old white female with a history of migraines. She had the sudden onset of left-sided headache yesterday evening. The character of the headache, throbbing unilateral, is similar to previous migraines but the onset was faster than usual. The pain is severe and was not relieved by her home Relpax, possibly because of associated nausea and vomiting. The nausea and vomiting were severe earlier but have improved significantly after being given Zofran in triage. She is also having photophobia and some blurred vision. She denies focal numbness or weakness.  Past Medical History  Diagnosis Date  . Migraine   . Kidney stone     Past Surgical History  Procedure Date  . Abdominal hysterectomy   . Tonsillectomy   . Lithotripsy     Family History  Problem Relation Age of Onset  . Diabetes Mother   . Diabetes Father   . Heart failure Father   . Hypertension Father   . Pulmonary fibrosis Father     History  Substance Use Topics  . Smoking status: Never Smoker   . Smokeless tobacco: Not on file  . Alcohol Use: Yes     occaisional    OB History    Grav Para Term Preterm Abortions TAB SAB Ect Mult Living                  Review of Systems  All other systems reviewed and are negative.    Allergies  Compazine and Reglan  Home Medications   Current Outpatient Rx  Name Route Sig Dispense Refill  . ELETRIPTAN HYDROBROMIDE 40 MG PO TABS Oral One tablet by mouth as needed for migraine headache.  If the headache improves and then returns, dose may be repeated after 2 hours have elapsed since first dose (do not exceed 80 mg per day). may repeat in 2 hours if necessary     .  ESCITALOPRAM OXALATE 20 MG PO TABS Oral Take 20 mg by mouth daily.      Marland Kitchen GABAPENTIN 600 MG PO TABS Oral Take 600 mg by mouth 3 (three) times daily.      Marland Kitchen OMEPRAZOLE 20 MG PO CPDR Oral Take 20 mg by mouth daily.      Marland Kitchen VITAMIN D (CHOLECALCIFEROL) PO Oral Take 1 tablet by mouth daily.        BP 116/76  Pulse 101  Temp(Src) 97.8 F (36.6 C) (Oral)  Resp 18  SpO2 98%  Physical Exam General: Well-developed, well-nourished female in no acute distress; appearance consistent with age of record HENT: normocephalic, atraumatic Eyes: pupils equal round and reactive to light; extraocular muscles intact; photophobia Neck: supple Heart: regular rate and rhythm Lungs: clear to auscultation bilaterally Abdomen: soft; nondistended Extremities: No deformity; full range of motion Neurologic: Awake, alert; motor function intact in all extremities and symmetric; no facial droop Skin: Warm and dry Psychiatric: Normal mood and affect    ED Course  Procedures (including critical care time)     MDM   6:13 AM Patient feeling much better after IV fluids and IV medications. She states she is ready to go home.         Hanley Seamen, MD 01/19/11 [phone]

## 2011-01-19 NOTE — ED Notes (Signed)
C/o severe HA, different from usual migraine, also nv, was unable to keep down migraine meds, pinpoints pain to L side of head, denies other sx.

## 2011-02-04 ENCOUNTER — Encounter (HOSPITAL_COMMUNITY): Payer: Self-pay | Admitting: *Deleted

## 2011-02-04 ENCOUNTER — Emergency Department (HOSPITAL_COMMUNITY)
Admission: EM | Admit: 2011-02-04 | Discharge: 2011-02-04 | Disposition: A | Discharge status: Home / Self Care | Payer: BC Managed Care – PPO | Attending: Emergency Medicine | Admitting: Emergency Medicine

## 2011-02-04 DIAGNOSIS — G43909 Migraine, unspecified, not intractable, without status migrainosus: Secondary | ICD-10-CM | POA: Insufficient documentation

## 2011-02-04 DIAGNOSIS — H53149 Visual discomfort, unspecified: Secondary | ICD-10-CM | POA: Insufficient documentation

## 2011-02-04 DIAGNOSIS — R110 Nausea: Secondary | ICD-10-CM | POA: Insufficient documentation

## 2011-02-04 MED ORDER — DEXAMETHASONE SODIUM PHOSPHATE 10 MG/ML IJ SOLN
10.0000 mg | Freq: Once | INTRAMUSCULAR | Status: AC
  Administered 2011-02-04: 10 mg via INTRAVENOUS
  Filled 2011-02-04: qty 1

## 2011-02-04 MED ORDER — MAGNESIUM SULFATE IN D5W 10-5 MG/ML-% IV SOLN
1.0000 g | INTRAVENOUS | Status: AC
  Administered 2011-02-04: 1 g via INTRAVENOUS
  Filled 2011-02-04: qty 100

## 2011-02-04 MED ORDER — MAGNESIUM SULFATE 50 % IJ SOLN
1.0000 g | INTRAMUSCULAR | Status: DC
  Filled 2011-02-04: qty 2

## 2011-02-04 MED ORDER — KETOROLAC TROMETHAMINE 30 MG/ML IJ SOLN
30.0000 mg | Freq: Once | INTRAMUSCULAR | Status: AC
  Administered 2011-02-04: 30 mg via INTRAVENOUS
  Filled 2011-02-04: qty 1

## 2011-02-04 MED ORDER — HYDROMORPHONE HCL PF 1 MG/ML IJ SOLN
1.0000 mg | Freq: Once | INTRAMUSCULAR | Status: AC
  Administered 2011-02-04: 1 mg via INTRAVENOUS
  Filled 2011-02-04: qty 1

## 2011-02-04 MED ORDER — DIPHENHYDRAMINE HCL 50 MG/ML IJ SOLN
25.0000 mg | Freq: Once | INTRAMUSCULAR | Status: AC
  Administered 2011-02-04: 50 mg via INTRAVENOUS
  Filled 2011-02-04: qty 1

## 2011-02-04 MED ORDER — SODIUM CHLORIDE 0.9 % IV BOLUS (SEPSIS)
1000.0000 mL | Freq: Once | INTRAVENOUS | Status: AC
  Administered 2011-02-04: 1000 mL via INTRAVENOUS

## 2011-02-04 MED ORDER — PROMETHAZINE HCL 25 MG/ML IJ SOLN
25.0000 mg | INTRAMUSCULAR | Status: AC
  Administered 2011-02-04: 25 mg via INTRAVENOUS
  Filled 2011-02-04: qty 1

## 2011-02-04 MED ORDER — OXYCODONE-ACETAMINOPHEN 5-325 MG PO TABS
2.0000 | ORAL_TABLET | Freq: Once | ORAL | Status: AC
  Administered 2011-02-04: 2 via ORAL
  Filled 2011-02-04: qty 2

## 2011-02-04 NOTE — ED Notes (Signed)
Pt states she has suffered from migraines for quite some time - this episode x 4 days.

## 2011-02-04 NOTE — ED Notes (Signed)
Phenergan ordered from pharmacy - all other meds given per orders.

## 2011-02-04 NOTE — ED Notes (Signed)
Patient with history of four days of migraine headache.  Has been taking her medication without any relief

## 2011-02-04 NOTE — ED Provider Notes (Signed)
History     CSN: [id number] Arrival date & time: 02/04/2011  2:21 AM   First MD Initiated Contact with Patient 02/04/11 0249      Chief Complaint  Patient presents with  . Migraine    (Consider location/radiation/quality/duration/timing/severity/associated sxs/prior treatment) HPI Comments: Typical for prior migraines.  Gradual in onset and has no neuro sx  Patient is a 30 y.o. female presenting with migraine. The history is provided by the patient. No language interpreter was used.  Migraine This is a new problem. The current episode started more than 2 days ago (4 days ago). The problem occurs constantly. The problem has been gradually worsening. Associated symptoms include headaches. Pertinent negatives include no chest pain, no abdominal pain and no shortness of breath. Exacerbated by: light and sound. Relieved by: for a few minutes by sumatriptan. The treatment provided mild relief.    Past Medical History  Diagnosis Date  . Migraine   . Kidney stone     Past Surgical History  Procedure Date  . Abdominal hysterectomy   . Tonsillectomy   . Lithotripsy     Family History  Problem Relation Age of Onset  . Diabetes Mother   . Diabetes Father   . Heart failure Father   . Hypertension Father   . Pulmonary fibrosis Father     History  Substance Use Topics  . Smoking status: Former Games developer  . Smokeless tobacco: Not on file  . Alcohol Use: Yes     occaisional    OB History    Grav Para Term Preterm Abortions TAB SAB Ect Mult Living                  Review of Systems  Constitutional: Negative for fever, activity change, appetite change and fatigue.  HENT: Negative for congestion, sore throat, rhinorrhea, neck pain and neck stiffness.   Eyes: Positive for photophobia. Negative for pain and visual disturbance.  Respiratory: Negative for cough and shortness of breath.   Cardiovascular: Negative for chest pain and palpitations.  Gastrointestinal: Positive for  nausea. Negative for vomiting and abdominal pain.  Genitourinary: Negative for dysuria, urgency, frequency and flank pain.  Neurological: Positive for headaches. Negative for dizziness, weakness, light-headedness and numbness.  All other systems reviewed and are negative.    Allergies  Compazine and Reglan  Home Medications   Current Outpatient Rx  Name Route Sig Dispense Refill  . DIPHENHYDRAMINE HCL 25 MG PO TABS Oral Take 25 mg by mouth every 6 (six) hours as needed. For sleep     . ESCITALOPRAM OXALATE 20 MG PO TABS Oral Take 20 mg by mouth daily.      Marland Kitchen GABAPENTIN 600 MG PO TABS Oral Take 600 mg by mouth 3 (three) times daily.      Marland Kitchen OMEPRAZOLE 20 MG PO CPDR Oral Take 20 mg by mouth daily.      Marland Kitchen PROMETHAZINE HCL 25 MG PO TABS Oral Take 25 mg by mouth every 6 (six) hours as needed. For nausea     . SUMATRIPTAN SUCCINATE 6 MG/0.5ML  DEVI Subcutaneous Inject 1 Device into the skin 2 (two) times daily as needed. For migraines     . VITAMIN D (CHOLECALCIFEROL) PO Oral Take 1 tablet by mouth daily.        BP 113/83  Pulse 75  Temp(Src) 98.5 F (36.9 C) (Oral)  Resp 16  SpO2 98%  Physical Exam  Nursing note and vitals reviewed. Constitutional: She is oriented to person,  place, and time. She appears well-developed and well-nourished. No distress.  HENT:  Head: Normocephalic and atraumatic.  Mouth/Throat: Oropharynx is clear and moist.  Eyes: Conjunctivae and EOM are normal. Pupils are equal, round, and reactive to light.       Photophobia present  Neck: Normal range of motion. Neck supple.  Cardiovascular: Normal rate, regular rhythm, normal heart sounds and intact distal pulses.   Pulmonary/Chest: Effort normal and breath sounds normal. No respiratory distress.  Abdominal: Soft. Bowel sounds are normal. There is no tenderness.  Musculoskeletal: Normal range of motion. She exhibits no tenderness.  Neurological: She is alert and oriented to person, place, and time. No  cranial nerve deficit.  Skin: Skin is warm. No rash noted.    ED Course  Procedures (including critical care time)  Labs Reviewed - No data to display No results found.   1. Migraine headache    6:43 AM Patient reassessed after receiving pain medication. Headache is improved and resolved. At this time patient wishes to be discharged home. She is encouraged to use her sumatriptan    MDM  Consistent with a migraine headache. I'm not concerned about a malignant cause of headache such as subarachnoid hemorrhage or meningitis. This is consistent with her prior migraine headaches. Resolved after medications in the emergency department. Instructed to followup with her primary care physician.        Dayton Bailiff, MD 02/04/11 [phone]

## 2011-03-03 ENCOUNTER — Observation Stay (HOSPITAL_COMMUNITY)
Admission: EM | Admit: 2011-03-03 | Discharge: 2011-03-03 | Disposition: A | Discharge status: Home / Self Care | Payer: BC Managed Care – PPO | Source: Ambulatory Visit | Attending: Emergency Medicine | Admitting: Emergency Medicine

## 2011-03-03 ENCOUNTER — Encounter (HOSPITAL_COMMUNITY): Payer: Self-pay | Admitting: *Deleted

## 2011-03-03 ENCOUNTER — Emergency Department (HOSPITAL_COMMUNITY): Payer: BC Managed Care – PPO

## 2011-03-03 DIAGNOSIS — N201 Calculus of ureter: Secondary | ICD-10-CM

## 2011-03-03 DIAGNOSIS — R109 Unspecified abdominal pain: Principal | ICD-10-CM | POA: Insufficient documentation

## 2011-03-03 DIAGNOSIS — R110 Nausea: Secondary | ICD-10-CM | POA: Insufficient documentation

## 2011-03-03 LAB — URINALYSIS, ROUTINE W REFLEX MICROSCOPIC
Bilirubin Urine: NEGATIVE
Glucose, UA: NEGATIVE mg/dL
Ketones, ur: NEGATIVE mg/dL
Nitrite: NEGATIVE
Protein, ur: NEGATIVE mg/dL
Specific Gravity, Urine: 1.017 (ref 1.005–1.030)
Urobilinogen, UA: 0.2 mg/dL (ref 0.0–1.0)
pH: 7 (ref 5.0–8.0)

## 2011-03-03 LAB — POCT I-STAT, CHEM 8
BUN: 13 mg/dL (ref 6–23)
Calcium, Ion: 1.2 mmol/L (ref 1.12–1.32)
Chloride: 107 mEq/L (ref 96–112)
Creatinine, Ser: 0.7 mg/dL (ref 0.50–1.10)
Glucose, Bld: 82 mg/dL (ref 70–99)
HCT: 44 % (ref 36.0–46.0)
Hemoglobin: 15 g/dL (ref 12.0–15.0)
Potassium: 4 mEq/L (ref 3.5–5.1)
Sodium: 142 mEq/L (ref 135–145)
TCO2: 26 mmol/L (ref 0–100)

## 2011-03-03 LAB — DIFFERENTIAL
Basophils Absolute: 0.1 10*3/uL (ref 0.0–0.1)
Basophils Relative: 1 % (ref 0–1)
Eosinophils Absolute: 0.4 10*3/uL (ref 0.0–0.7)
Eosinophils Relative: 3 % (ref 0–5)
Lymphocytes Relative: 36 % (ref 12–46)
Lymphs Abs: 4.3 10*3/uL — ABNORMAL HIGH (ref 0.7–4.0)
Monocytes Absolute: 0.7 10*3/uL (ref 0.1–1.0)
Monocytes Relative: 6 % (ref 3–12)
Neutro Abs: 6.4 10*3/uL (ref 1.7–7.7)
Neutrophils Relative %: 54 % (ref 43–77)

## 2011-03-03 LAB — CBC
HCT: 43.5 % (ref 36.0–46.0)
Hemoglobin: 14.5 g/dL (ref 12.0–15.0)
MCH: 30.9 pg (ref 26.0–34.0)
MCHC: 33.3 g/dL (ref 30.0–36.0)
MCV: 92.8 fL (ref 78.0–100.0)
Platelets: 246 10*3/uL (ref 150–400)
RBC: 4.69 MIL/uL (ref 3.87–5.11)
RDW: 12.8 % (ref 11.5–15.5)
WBC: 11.8 10*3/uL — ABNORMAL HIGH (ref 4.0–10.5)

## 2011-03-03 LAB — URINE MICROSCOPIC-ADD ON

## 2011-03-03 LAB — POCT PREGNANCY, URINE: Preg Test, Ur: NEGATIVE

## 2011-03-03 MED ORDER — OXYCODONE-ACETAMINOPHEN 5-325 MG PO TABS: 1.0000 | ORAL_TABLET | Freq: Four times a day (QID) | ORAL | Status: DC | PRN

## 2011-03-03 MED ORDER — SODIUM CHLORIDE 0.9 % IV BOLUS (SEPSIS)
500.0000 mL | Freq: Once | INTRAVENOUS | Status: AC
  Administered 2011-03-03: 500 mL via INTRAVENOUS

## 2011-03-03 MED ORDER — KETOROLAC TROMETHAMINE 30 MG/ML IJ SOLN
30.0000 mg | Freq: Once | INTRAMUSCULAR | Status: AC
  Administered 2011-03-03: 30 mg via INTRAVENOUS
  Filled 2011-03-03: qty 1

## 2011-03-03 MED ORDER — HYDROMORPHONE HCL PF 1 MG/ML IJ SOLN
1.0000 mg | Freq: Once | INTRAMUSCULAR | Status: AC
  Administered 2011-03-03: 1 mg via INTRAVENOUS
  Filled 2011-03-03: qty 1

## 2011-03-03 MED ORDER — ONDANSETRON HCL 4 MG/2ML IJ SOLN
4.0000 mg | Freq: Once | INTRAMUSCULAR | Status: AC
  Administered 2011-03-03: 4 mg via INTRAVENOUS
  Filled 2011-03-03: qty 2

## 2011-03-03 MED ORDER — HYDROMORPHONE HCL PF 1 MG/ML IJ SOLN
1.0000 mg | Freq: Once | INTRAMUSCULAR | Status: AC
  Administered 2011-03-03: 1 mg via INTRAVENOUS

## 2011-03-03 MED ORDER — ONDANSETRON HCL 4 MG PO TABS: 4.0000 mg | ORAL_TABLET | Freq: Four times a day (QID) | ORAL | Status: DC | PRN

## 2011-03-03 MED ORDER — SODIUM CHLORIDE 0.9 % IV SOLN
INTRAVENOUS | Status: DC
  Administered 2011-03-03: 03:00:00 via INTRAVENOUS

## 2011-03-03 MED ORDER — HYDROMORPHONE HCL PF 1 MG/ML IJ SOLN
1.0000 mg | INTRAMUSCULAR | Status: DC | PRN
  Administered 2011-03-03 (×2): 1 mg via INTRAVENOUS
  Filled 2011-03-03 (×3): qty 1

## 2011-03-03 NOTE — ED Notes (Signed)
Pt reports (R) sided flank pain that started tonight.  States that she had some blood in urine last time she urinated.  Hx of kdiney stones.

## 2011-03-03 NOTE — ED Notes (Signed)
Lorenz Coaster, PA into see pt.

## 2011-03-03 NOTE — ED Notes (Signed)
Presents with c/o right flank pain with sudden onset early in the evening Patient states hx of kidney stone.  Also states seeing blood in urine.

## 2011-03-03 NOTE — ED Provider Notes (Signed)
7:47 AM 31 y/o F with hx kidney stones, moved to the CDU for pain management in the setting of flank pain similar to prior kidney stone pain assoc with hematuria. A&O x 3. Lungs CTAB. Heart RRR. Abd soft/NT/ND, +BS. MAEW. Speech normal. C/o pain 7/10 at time of assessment. Breakthrough pain medication x 1 has been ordered. Will continue to monitor.   9:29 AM Pt reports her pain has moved from the right flank to the right side of her abdomen and she feels the kidney stone is moving. She feels ready for discharge home and would like to continue with PO pain medication and outpt urology f/u. Encouraged to return (preferably to [facility]) for re-evaluation if symptoms worsen or unable to keep down PO meds. Pt voices understanding of plan.  Elwyn Reach [place], [place] 03/03/11 [phone]   Pt has Flomax and 800mg  Ibuprofen already at home and does not need an Rx for these.  [address] [place], [place] 03/03/11 [phone]

## 2011-03-03 NOTE — ED Provider Notes (Signed)
History     CSN: [id number]  Arrival date & time 03/03/11  0231   First MD Initiated Contact with Patient 03/03/11 0302      Chief Complaint  Patient presents with  . Flank Pain    (Consider location/radiation/quality/duration/timing/severity/associated sxs/prior treatment) Patient is a 31 y.o. female presenting with flank pain. The history is provided by the patient.  Flank Pain This is a recurrent problem. Pertinent negatives include no chest pain, no abdominal pain, no headaches and no shortness of breath.   right flank pain began the night. Feels like previous kidney stones. Nauseous without vomiting. No fevers. No diarrhea. No cough. No headache. Chills. Pain is constant. It is crampy.  Past Medical History  Diagnosis Date  . Migraine   . Kidney stone     Past Surgical History  Procedure Date  . Abdominal hysterectomy   . Tonsillectomy   . Lithotripsy     Family History  Problem Relation Age of Onset  . Diabetes Mother   . Diabetes Father   . Heart failure Father   . Hypertension Father   . Pulmonary fibrosis Father     History  Substance Use Topics  . Smoking status: Former Games developer  . Smokeless tobacco: Not on file  . Alcohol Use: Yes     occaisional    OB History    Grav Para Term Preterm Abortions TAB SAB Ect Mult Living                  Review of Systems  Constitutional: Negative for activity change and appetite change.  HENT: Negative for neck stiffness.   Eyes: Negative for pain.  Respiratory: Negative for chest tightness and shortness of breath.   Cardiovascular: Negative for chest pain and leg swelling.  Gastrointestinal: Positive for nausea. Negative for vomiting, abdominal pain and diarrhea.  Genitourinary: Positive for flank pain.  Musculoskeletal: Negative for back pain.  Skin: Negative for rash.  Neurological: Negative for weakness, numbness and headaches.  Psychiatric/Behavioral: Negative for behavioral problems.    Allergies    Compazine and Reglan  Home Medications   Current Outpatient Rx  Name Route Sig Dispense Refill  . ELETRIPTAN HYDROBROMIDE 40 MG PO TABS Oral One tablet by mouth as needed for migraine headache.  If the headache improves and then returns, dose may be repeated after 2 hours have elapsed since first dose (do not exceed 80 mg per day). may repeat in 2 hours if necessary-for pain     . ESCITALOPRAM OXALATE 20 MG PO TABS Oral Take 20 mg by mouth daily.      Marland Kitchen GABAPENTIN 600 MG PO TABS Oral Take 600 mg by mouth 3 (three) times daily.      Marland Kitchen OMEPRAZOLE 20 MG PO CPDR Oral Take 20 mg by mouth daily.      Marland Kitchen BOTOX IJ Injection Inject 50 Units as directed every 3 (three) months.      Marland Kitchen VITAMIN D (CHOLECALCIFEROL) PO Oral Take 1 tablet by mouth daily.        BP 108/73  Pulse 56  Temp(Src) 97.1 F (36.2 C) (Oral)  Resp 18  SpO2 99%  Physical Exam  Nursing note and vitals reviewed. Constitutional: She is oriented to person, place, and time. She appears well-developed and well-nourished.  HENT:  Head: Normocephalic and atraumatic.  Eyes: EOM are normal. Pupils are equal, round, and reactive to light.  Neck: Normal range of motion. Neck supple.  Cardiovascular: Normal rate, regular rhythm and  normal heart sounds.   No murmur heard. Pulmonary/Chest: Effort normal and breath sounds normal. No respiratory distress. She has no wheezes. She has no rales.  Abdominal: Soft. Bowel sounds are normal. She exhibits no distension. There is no tenderness. There is no rebound and no guarding.  Genitourinary:       CVA tenderness on the right.  Musculoskeletal: Normal range of motion.  Neurological: She is alert and oriented to person, place, and time. No cranial nerve deficit.  Skin: Skin is warm and dry.  Psychiatric: She has a normal mood and affect. Her speech is normal.    ED Course  Procedures (including critical care time)  Labs Reviewed  URINALYSIS, ROUTINE W REFLEX MICROSCOPIC - Abnormal;  Notable for the following:    APPearance CLOUDY (*)    Hgb urine dipstick LARGE (*)    Leukocytes, UA SMALL (*)    All other components within normal limits  CBC - Abnormal; Notable for the following:    WBC 11.8 (*)    All other components within normal limits  DIFFERENTIAL - Abnormal; Notable for the following:    Lymphs Abs 4.3 (*)    All other components within normal limits  URINE MICROSCOPIC-ADD ON - Abnormal; Notable for the following:    Squamous Epithelial / LPF FEW (*)    Bacteria, UA FEW (*)    All other components within normal limits  POCT PREGNANCY, URINE  POCT I-STAT, CHEM 8  POCT PREGNANCY, URINE  I-STAT, CHEM 8   Dg Abd 1 View  03/03/2011  *RADIOLOGY REPORT*  Clinical Data: Right flank pain.  ABDOMEN - 1 VIEW  Comparison: 06/29/2010 radiograph  Findings: Nonobstructive bowel gas pattern.  Organ outlines are normal where seen.  The hemidiaphragms are excluded from the image. No acute osseous abnormality. 5 mm calcific density projecting over the right upper quadrant is nonspecific.  IMPRESSION: Nonobstructive bowel gas pattern.  5 mm calcific density projecting over the right upper quadrant is nonspecific, potentially in the right renal collecting system.  Original Report Authenticated By: Waneta Martins, M.D.     No diagnosis found.    MDM  In a patient with right flank pain. History kidney stones. Patient has had recent CAT scans that showed stones. KUB was done without clear stone although there is a possible 5 mm right renal stone. Pain is continued initiated. She was put on a protocol.       Juliet Rude. Rubin Payor, MD 03/03/11 [phone]

## 2011-03-03 NOTE — ED Notes (Signed)
Report given to j green in cdu

## 2011-03-04 ENCOUNTER — Encounter (HOSPITAL_COMMUNITY): Payer: Self-pay | Admitting: *Deleted

## 2011-03-04 ENCOUNTER — Emergency Department (HOSPITAL_COMMUNITY)
Admission: EM | Admit: 2011-03-04 | Discharge: 2011-03-05 | Disposition: A | Discharge status: Home / Self Care | Payer: BC Managed Care – PPO | Attending: Emergency Medicine | Admitting: Emergency Medicine

## 2011-03-04 ENCOUNTER — Emergency Department (HOSPITAL_COMMUNITY)
Admission: EM | Admit: 2011-03-04 | Discharge: 2011-03-04 | Discharge status: Against Medical Advice | Payer: BC Managed Care – PPO

## 2011-03-04 ENCOUNTER — Emergency Department (HOSPITAL_COMMUNITY): Payer: BC Managed Care – PPO

## 2011-03-04 DIAGNOSIS — N200 Calculus of kidney: Secondary | ICD-10-CM

## 2011-03-04 DIAGNOSIS — R6883 Chills (without fever): Secondary | ICD-10-CM | POA: Insufficient documentation

## 2011-03-04 DIAGNOSIS — R109 Unspecified abdominal pain: Secondary | ICD-10-CM

## 2011-03-04 DIAGNOSIS — R110 Nausea: Secondary | ICD-10-CM | POA: Insufficient documentation

## 2011-03-04 LAB — URINALYSIS, ROUTINE W REFLEX MICROSCOPIC
Bilirubin Urine: NEGATIVE
Glucose, UA: NEGATIVE mg/dL
Ketones, ur: NEGATIVE mg/dL
Leukocytes, UA: NEGATIVE
Nitrite: NEGATIVE
Protein, ur: NEGATIVE mg/dL
Specific Gravity, Urine: 1.005 (ref 1.005–1.030)
Urobilinogen, UA: 0.2 mg/dL (ref 0.0–1.0)
pH: 7.5 (ref 5.0–8.0)

## 2011-03-04 LAB — CBC
HCT: 38.1 % (ref 36.0–46.0)
Hemoglobin: 12.5 g/dL (ref 12.0–15.0)
MCH: 30.7 pg (ref 26.0–34.0)
MCHC: 32.8 g/dL (ref 30.0–36.0)
MCV: 93.6 fL (ref 78.0–100.0)
Platelets: 210 10*3/uL (ref 150–400)
RBC: 4.07 MIL/uL (ref 3.87–5.11)
RDW: 12.5 % (ref 11.5–15.5)
WBC: 9.5 10*3/uL (ref 4.0–10.5)

## 2011-03-04 LAB — URINE CULTURE
Colony Count: >=100000
Culture  Setup Time: 201301072300

## 2011-03-04 LAB — POCT I-STAT, CHEM 8
BUN: 11 mg/dL (ref 6–23)
Calcium, Ion: 1.2 mmol/L (ref 1.12–1.32)
Chloride: 106 mEq/L (ref 96–112)
Creatinine, Ser: 0.8 mg/dL (ref 0.50–1.10)
Glucose, Bld: 83 mg/dL (ref 70–99)
HCT: 37 % (ref 36.0–46.0)
Hemoglobin: 12.6 g/dL (ref 12.0–15.0)
Potassium: 4.1 mEq/L (ref 3.5–5.1)
Sodium: 143 mEq/L (ref 135–145)
TCO2: 27 mmol/L (ref 0–100)

## 2011-03-04 LAB — URINE MICROSCOPIC-ADD ON

## 2011-03-04 MED ORDER — HYDROMORPHONE HCL PF 1 MG/ML IJ SOLN
1.0000 mg | Freq: Once | INTRAMUSCULAR | Status: AC
  Administered 2011-03-04: 1 mg via INTRAVENOUS

## 2011-03-04 MED ORDER — KETOROLAC TROMETHAMINE 30 MG/ML IJ SOLN: 30.0000 mg | Freq: Once | INTRAMUSCULAR | Status: AC

## 2011-03-04 MED ORDER — HYDROMORPHONE HCL PF 1 MG/ML IJ SOLN
1.0000 mg | Freq: Once | INTRAMUSCULAR | Status: AC
  Administered 2011-03-04: 1 mg via INTRAVENOUS
  Filled 2011-03-04: qty 1

## 2011-03-04 MED ORDER — HYDROMORPHONE HCL PF 2 MG/ML IJ SOLN
2.0000 mg | Freq: Once | INTRAMUSCULAR | Status: AC
  Administered 2011-03-04: 2 mg via INTRAMUSCULAR
  Filled 2011-03-04: qty 1

## 2011-03-04 MED ORDER — KETOROLAC TROMETHAMINE 30 MG/ML IJ SOLN
30.0000 mg | Freq: Once | INTRAMUSCULAR | Status: AC
  Administered 2011-03-04: 30 mg via INTRAMUSCULAR
  Filled 2011-03-04: qty 1

## 2011-03-04 MED ORDER — ONDANSETRON HCL 4 MG/2ML IJ SOLN
4.0000 mg | Freq: Once | INTRAMUSCULAR | Status: AC
  Administered 2011-03-04: 4 mg via INTRAVENOUS
  Filled 2011-03-04: qty 2

## 2011-03-04 NOTE — ED Provider Notes (Signed)
History     CSN: [id number]  Arrival date & time 03/04/11  2009   First MD Initiated Contact with Patient 03/04/11 2129      Chief Complaint  Patient presents with  . Flank Pain    (Consider location/radiation/quality/duration/timing/severity/associated sxs/prior treatment) Patient is a 31 y.o. female presenting with flank pain. The history is provided by the patient.  Flank Pain This is a recurrent problem. The current episode started in the past 7 days. The problem occurs constantly. The problem has been gradually worsening. Associated symptoms include chills and nausea. Pertinent negatives include no abdominal pain, chest pain, fever, headaches, rash or vomiting. The symptoms are aggravated by nothing. She has tried oral narcotics for the symptoms. The treatment provided mild relief.  Right flank pain.Pt seen in ED yesterday morning for same complaint. tx at that time with IV pain medication. She was d/c'd home when she felt ready to continue outpt treatment. Returns to the ED with pain 10/10 after percocet use.  Past Medical History  Diagnosis Date  . Migraine   . Kidney stone     Past Surgical History  Procedure Date  . Abdominal hysterectomy   . Tonsillectomy   . Lithotripsy     Family History  Problem Relation Age of Onset  . Diabetes Mother   . Diabetes Father   . Heart failure Father   . Hypertension Father   . Pulmonary fibrosis Father     History  Substance Use Topics  . Smoking status: Former Games developer  . Smokeless tobacco: Not on file  . Alcohol Use: Yes     occaisional     Review of Systems  Constitutional: Positive for chills. Negative for fever.  HENT: Negative.   Eyes: Negative for pain.  Respiratory: Negative.   Cardiovascular: Negative for chest pain and palpitations.  Gastrointestinal: Positive for nausea. Negative for vomiting and abdominal pain.  Genitourinary: Positive for flank pain.  Musculoskeletal: Negative.   Skin: Negative for rash.   Neurological: Negative for headaches.    Allergies  Compazine and Reglan  Home Medications   Current Outpatient Rx  Name Route Sig Dispense Refill  . ELETRIPTAN HYDROBROMIDE 40 MG PO TABS Oral One tablet by mouth as needed for migraine headache.  If the headache improves and then returns, dose may be repeated after 2 hours have elapsed since first dose (do not exceed 80 mg per day). may repeat in 2 hours if necessary-for pain     . ESCITALOPRAM OXALATE 20 MG PO TABS Oral Take 20 mg by mouth daily.      Marland Kitchen GABAPENTIN 600 MG PO TABS Oral Take 600 mg by mouth 3 (three) times daily.      Marland Kitchen OMEPRAZOLE 20 MG PO CPDR Oral Take 20 mg by mouth daily.      Marland Kitchen BOTOX IJ Injection Inject 50 Units as directed every 3 (three) months.      . OXYCODONE-ACETAMINOPHEN 5-325 MG PO TABS Oral Take 1-2 tablets by mouth every 6 (six) hours as needed for pain. 20 tablet 0  . PROMETHAZINE HCL 25 MG PO TABS Oral Take 25 mg by mouth as needed. For nausea and vomiting     . VITAMIN D (CHOLECALCIFEROL) PO Oral Take 1 tablet by mouth daily.        BP 128/88  Pulse 95  Temp(Src) 98.3 F (36.8 C) (Oral)  Resp 22  SpO2 98%  Physical Exam  Nursing note and vitals reviewed. Constitutional: She is oriented to person,  place, and time. She appears well-developed and well-nourished.       uncomfortable appearing  HENT:  Head: Normocephalic and atraumatic.  Right Ear: External ear normal.  Left Ear: External ear normal.  Eyes: Pupils are equal, round, and reactive to light.  Neck: Normal range of motion. Neck supple.  Cardiovascular: Normal rate, regular rhythm and normal heart sounds.   Pulmonary/Chest: Effort normal and breath sounds normal. No respiratory distress. She has no wheezes.  Abdominal: Soft. Bowel sounds are normal. She exhibits no distension. There is no tenderness. There is no rebound and no guarding.  Genitourinary:       Mild right CVAT  Musculoskeletal: She exhibits no edema and no tenderness.    Neurological: She is alert and oriented to person, place, and time. No cranial nerve deficit.  Skin: Skin is warm and dry.  Psychiatric: Her behavior is normal.    ED Course  Procedures (including critical care time)  Labs Reviewed  URINALYSIS, ROUTINE W REFLEX MICROSCOPIC - Abnormal; Notable for the following:    Hgb urine dipstick LARGE (*)    All other components within normal limits  CBC  POCT I-STAT, CHEM 8  URINE MICROSCOPIC-ADD ON  I-STAT, CHEM 8  URINE CULTURE   Ct Abdomen Pelvis Wo Contrast  03/04/2011  *RADIOLOGY REPORT*  Clinical Data: Stones.  Flank pain.  Right-sided lower abdominal pain.  Nausea and vomiting.  CT ABDOMEN AND PELVIS WITHOUT CONTRAST  Technique:  Multidetector CT imaging of the abdomen and pelvis was performed following the standard protocol without intravenous contrast.  Comparison: 06/10/2010.  Findings: The lung bases appear normal.  Noncontrast appearance of the liver and spleen normal aside from a tiny old granulomatous calcification in the liver.  Nonobstructing 4 mm left interpolar renal collecting system calculus.  The left ureter appears normal. Right ureter is also normal.  No right renal calculi. Gallbladder partially contracted.  No gallbladder inflammatory changes.  Normal appendix in the right lower quadrant.  Prominent stool burden is present.  Noncontrast appearance of the pancreas, stomach and duodenum is within normal limits.  Adrenal glands also appear normal.  There are no small bowel inflammatory changes.  No free fluid in the anatomic pelvis.  Urinary bladder appears normal.  Colon is within normal limits.  Hysterectomy.  IMPRESSION:  1.  No acute abnormality. 2.  4 mm nonobstructing left interpolar renal collecting system calculus.  No ureteral calculi. 3.  Hysterectomy.  Original Report Authenticated By: Andreas Newport, M.D.   Dg Abd 1 View  03/03/2011  *RADIOLOGY REPORT*  Clinical Data: Right flank pain.  ABDOMEN - 1 VIEW  Comparison:  06/29/2010 radiograph  Findings: Nonobstructive bowel gas pattern.  Organ outlines are normal where seen.  The hemidiaphragms are excluded from the image. No acute osseous abnormality. 5 mm calcific density projecting over the right upper quadrant is nonspecific.  IMPRESSION: Nonobstructive bowel gas pattern.  5 mm calcific density projecting over the right upper quadrant is nonspecific, potentially in the right renal collecting system.  Original Report Authenticated By: Waneta Martins, M.D.     Dx 1: right flank pain Dx 2: Left kidney stone   MDM  Hx kidney stone with likely stone on abd xray yesterday. Hx lithotripsy and stenting for prior stones. Pain uncontrolled with percocet at home. Urology paged.    10:44 PM Dr Margarita Grizzle requests a CT abd/pelvis without contrast to confirm stone prior to accepting pt.   12:07 AM Ct scan reviewed. Confirmed kidney stone on  contralateral side to pain. Discussed with Dr Margarita Grizzle. Pt will be discharged home to f/u with PCP and/or urology as needed.  [address] [place], [place] 03/05/11 [phone]

## 2011-03-04 NOTE — ED Notes (Signed)
Urine sample provide for lab if needed.

## 2011-03-04 NOTE — ED Notes (Signed)
Pt states wait is too long and will go to [facility]

## 2011-03-06 DIAGNOSIS — R1011 Right upper quadrant pain: Secondary | ICD-10-CM | POA: Insufficient documentation

## 2011-03-06 DIAGNOSIS — Z79899 Other long term (current) drug therapy: Secondary | ICD-10-CM | POA: Insufficient documentation

## 2011-03-06 DIAGNOSIS — R112 Nausea with vomiting, unspecified: Secondary | ICD-10-CM | POA: Insufficient documentation

## 2011-03-06 DIAGNOSIS — K8020 Calculus of gallbladder without cholecystitis without obstruction: Secondary | ICD-10-CM | POA: Insufficient documentation

## 2011-03-06 NOTE — ED Provider Notes (Signed)
Medical screening examination/treatment/procedure(s) were conducted as a shared visit with non-physician practitioner(s) and myself.  I personally evaluated the patient during the encounter  Pt c/o right flank pain. ?kidney stones. abd soft nt. Left sided renal stone on ct, no right sided hydronephrosis or stone.   Suzi Roots, MD 03/06/11 [phone]

## 2011-03-07 ENCOUNTER — Encounter (HOSPITAL_COMMUNITY): Payer: Self-pay | Admitting: Emergency Medicine

## 2011-03-07 ENCOUNTER — Emergency Department (HOSPITAL_COMMUNITY)
Admission: EM | Admit: 2011-03-07 | Discharge: 2011-03-07 | Disposition: A | Discharge status: Home / Self Care | Payer: BC Managed Care – PPO | Attending: Emergency Medicine | Admitting: Emergency Medicine

## 2011-03-07 ENCOUNTER — Emergency Department (HOSPITAL_COMMUNITY): Payer: BC Managed Care – PPO

## 2011-03-07 DIAGNOSIS — K8050 Calculus of bile duct without cholangitis or cholecystitis without obstruction: Secondary | ICD-10-CM

## 2011-03-07 HISTORY — DX: Calculus of kidney: N20.0

## 2011-03-07 LAB — POCT I-STAT, CHEM 8
BUN: 13 mg/dL (ref 6–23)
Calcium, Ion: 1.19 mmol/L (ref 1.12–1.32)
Chloride: 105 mEq/L (ref 96–112)
Creatinine, Ser: 0.8 mg/dL (ref 0.50–1.10)
Glucose, Bld: 85 mg/dL (ref 70–99)
HCT: 40 % (ref 36.0–46.0)
Hemoglobin: 13.6 g/dL (ref 12.0–15.0)
Potassium: 3.9 mEq/L (ref 3.5–5.1)
Sodium: 142 mEq/L (ref 135–145)
TCO2: 30 mmol/L (ref 0–100)

## 2011-03-07 LAB — URINALYSIS, ROUTINE W REFLEX MICROSCOPIC
Bilirubin Urine: NEGATIVE
Glucose, UA: NEGATIVE mg/dL
Ketones, ur: 15 mg/dL — AB
Leukocytes, UA: NEGATIVE
Nitrite: NEGATIVE
Protein, ur: NEGATIVE mg/dL
Specific Gravity, Urine: 1.025 (ref 1.005–1.030)
Urobilinogen, UA: 1 mg/dL (ref 0.0–1.0)
pH: 7 (ref 5.0–8.0)

## 2011-03-07 LAB — HEPATIC FUNCTION PANEL
ALT: 13 U/L (ref 0–35)
AST: 16 U/L (ref 0–37)
Albumin: 3.8 g/dL (ref 3.5–5.2)
Alkaline Phosphatase: 75 U/L (ref 39–117)
Bilirubin, Direct: <0.1 mg/dL (ref 0.0–0.3)
Total Bilirubin: 0.2 mg/dL — ABNORMAL LOW (ref 0.3–1.2)
Total Protein: 6.9 g/dL (ref 6.0–8.3)

## 2011-03-07 LAB — LIPASE, BLOOD: Lipase: 33 U/L (ref 11–59)

## 2011-03-07 LAB — URINE MICROSCOPIC-ADD ON

## 2011-03-07 MED ORDER — HYDROMORPHONE HCL PF 1 MG/ML IJ SOLN
1.0000 mg | INTRAMUSCULAR | Status: DC | PRN
  Administered 2011-03-07: 1 mg via INTRAVENOUS
  Filled 2011-03-07 (×2): qty 1

## 2011-03-07 MED ORDER — HYDROMORPHONE HCL PF 1 MG/ML IJ SOLN
1.0000 mg | Freq: Once | INTRAMUSCULAR | Status: AC
  Administered 2011-03-07: 1 mg via INTRAVENOUS

## 2011-03-07 MED ORDER — SODIUM CHLORIDE 0.9 % IV SOLN
INTRAVENOUS | Status: DC
  Administered 2011-03-07: 05:00:00 via INTRAVENOUS

## 2011-03-07 MED ORDER — ONDANSETRON HCL 4 MG/2ML IJ SOLN
4.0000 mg | Freq: Once | INTRAMUSCULAR | Status: AC
  Administered 2011-03-07: 4 mg via INTRAVENOUS
  Filled 2011-03-07: qty 2

## 2011-03-07 MED ORDER — OXYCODONE-ACETAMINOPHEN 5-325 MG PO TABS: 1.0000 | ORAL_TABLET | ORAL | Status: DC | PRN

## 2011-03-07 NOTE — ED Provider Notes (Signed)
Medical screening examination/treatment/procedure(s) were performed by non-physician practitioner and as supervising physician I was immediately available for consultation/collaboration.  Sami Roes R. Damire Remedios, MD 03/07/11 0654 

## 2011-03-07 NOTE — ED Notes (Signed)
Pt was recently seen in ED for same abd pain. Pt was noted to have a L sided kidney stone. Pt went to PCP and was told it could be her gallbladder. Pt went out for dinner which after started to have R sided abd pain. Pt has nausea and vomiting. Denies any diarrhea. Pt denies any trouble with urination. Denies any vaginal discharge or concerns. Pt pain is in upper abd and back.

## 2011-03-07 NOTE — ED Notes (Signed)
Patient transported to Ultrasound 

## 2011-03-07 NOTE — ED Notes (Signed)
PT ambulated with a steady gait; VSS; no signs of distress;A&Ox3; respirations even and unlabored; skin warm and dry; husband to drive her home.

## 2011-03-07 NOTE — ED Notes (Signed)
PT reports constant nausea and dry heaves; RUQ pain and feels like a "belt around upper abdomen" pain in upper back on right side as well. Pain in shoulder blade area.

## 2011-03-07 NOTE — ED Notes (Signed)
PT resting comfortably; given blanket and call bell.

## 2011-03-07 NOTE — ED Provider Notes (Signed)
History     CSN: [id number]  Arrival date & time 03/06/11  2358   First MD Initiated Contact with Patient 03/07/11 0435      Chief Complaint  Patient presents with  . Abdominal Pain    (Consider location/radiation/quality/duration/timing/severity/associated sxs/prior treatment) HPI This is a 31 year old white female with a three-day history of episodic right upper quadrant abdominal pain. She's been seen twice in the ED and by her primary care physician. In the ED it was thought she had a kidney stone as there was microscopic hematuria. A CT scan showed a nonobstructive left kidney stone. Despite treatment with Percocet and Phenergan she returns with a severe episode of right upper quadrant pain that began after eating yesterday evening. It has been accompanied by nausea and vomiting but no diarrhea. In retrospect she believes some of her previous pain episodes have been postprandial. She denies fever or chills. She denies urinary symptoms. She denies vaginal discharge or bleeding. She states the pain radiates around both to the left and to the right.  Past Medical History  Diagnosis Date  . Migraine   . Kidney stone   . Kidney stones     Past Surgical History  Procedure Date  . Abdominal hysterectomy   . Tonsillectomy   . Lithotripsy     Family History  Problem Relation Age of Onset  . Diabetes Mother   . Diabetes Father   . Heart failure Father   . Hypertension Father   . Pulmonary fibrosis Father     History  Substance Use Topics  . Smoking status: Former Games developer  . Smokeless tobacco: Not on file  . Alcohol Use: Yes     occaisional    OB History    Grav Para Term Preterm Abortions TAB SAB Ect Mult Living                  Review of Systems  All other systems reviewed and are negative.    Allergies  Compazine and Reglan  Home Medications   Current Outpatient Rx  Name Route Sig Dispense Refill  . ELETRIPTAN HYDROBROMIDE 40 MG PO TABS Oral One tablet by  mouth as needed for migraine headache.  If the headache improves and then returns, dose may be repeated after 2 hours have elapsed since first dose (do not exceed 80 mg per day). may repeat in 2 hours if necessary-for pain     . ESCITALOPRAM OXALATE 20 MG PO TABS Oral Take 20 mg by mouth daily.      Marland Kitchen GABAPENTIN 600 MG PO TABS Oral Take 600 mg by mouth 3 (three) times daily.      Marland Kitchen OMEPRAZOLE 20 MG PO CPDR Oral Take 20 mg by mouth daily.      . OXYCODONE-ACETAMINOPHEN 5-325 MG PO TABS Oral Take 1-2 tablets by mouth every 6 (six) hours as needed for pain. 20 tablet 0  . PROMETHAZINE HCL 25 MG PO TABS Oral Take 25 mg by mouth as needed. For nausea and vomiting     . VITAMIN D (CHOLECALCIFEROL) PO Oral Take 1 tablet by mouth daily.      Marland Kitchen BOTOX IJ Injection Inject 50 Units as directed every 3 (three) months.        BP 113/78  Pulse 71  Temp(Src) 98.1 F (36.7 C) (Oral)  Resp 18  SpO2 100%  Physical Exam General: Well-developed, well-nourished female in no acute distress; appearance consistent with age of record; uncomfortable per HENT: normocephalic, atraumatic Eyes:  pupils equal round and reactive to light; extraocular muscles intact Neck: supple Heart: regular rate and rhythm Lungs: clear to auscultation bilaterally Abdomen: soft; moderate to severe right upper quadrant tenderness; positive Murphy sign; bowel sounds present Extremities: No deformity; full range of motion Neurologic: Awake, alert and oriented; motor function intact in all extremities and symmetric; no facial droop Skin: Warm and dry Psychiatric: Mildly agitated due to pain    ED Course  Procedures (including critical care time)     MDM   Nursing notes and vitals signs, including pulse oximetry, reviewed.  Summary of this visit's results, reviewed by myself:  Labs:  Results for orders placed during the hospital encounter of 03/07/11  URINALYSIS, ROUTINE W REFLEX MICROSCOPIC      Component Value Range    Color, Urine YELLOW  YELLOW    APPearance CLEAR  CLEAR    Specific Gravity, Urine 1.025  1.005 - 1.030    pH 7.0  5.0 - 8.0    Glucose, UA NEGATIVE  NEGATIVE (mg/dL)   Hgb urine dipstick LARGE (*) NEGATIVE    Bilirubin Urine NEGATIVE  NEGATIVE    Ketones, ur 15 (*) NEGATIVE (mg/dL)   Protein, ur NEGATIVE  NEGATIVE (mg/dL)   Urobilinogen, UA 1.0  0.0 - 1.0 (mg/dL)   Nitrite NEGATIVE  NEGATIVE    Leukocytes, UA NEGATIVE  NEGATIVE   URINE MICROSCOPIC-ADD ON      Component Value Range   RBC / HPF 11-20  <3 (RBC/hpf)   Urine-Other MUCOUS PRESENT    HEPATIC FUNCTION PANEL      Component Value Range   Total Protein 6.9  6.0 - 8.3 (g/dL)   Albumin 3.8  3.5 - 5.2 (g/dL)   AST 16  0 - 37 (U/L)   ALT 13  0 - 35 (U/L)   Alkaline Phosphatase 75  39 - 117 (U/L)   Total Bilirubin 0.2 (*) 0.3 - 1.2 (mg/dL)   Bilirubin, Direct <4.0  0.0 - 0.3 (mg/dL)   Indirect Bilirubin NOT CALCULATED  0.3 - 0.9 (mg/dL)  LIPASE, BLOOD      Component Value Range   Lipase 33  11 - 59 (U/L)  POCT I-STAT, CHEM 8      Component Value Range   Sodium 142  135 - 145 (mEq/L)   Potassium 3.9  3.5 - 5.1 (mEq/L)   Chloride 105  96 - 112 (mEq/L)   BUN 13  6 - 23 (mg/dL)   Creatinine, Ser 9.81  0.50 - 1.10 (mg/dL)   Glucose, Bld 85  70 - 99 (mg/dL)   Calcium, Ion 1.91  4.78 - 1.32 (mmol/L)   TCO2 30  0 - 100 (mmol/L)   Hemoglobin 13.6  12.0 - 15.0 (g/dL)   HCT 29.5  62.1 - 30.8 (%)    Imaging Studies: US Abdomen Complete  03/07/2011  *RADIOLOGY REPORT*  Clinical Data:  Right upper quadrant pain.  Cholecystitis.  COMPLETE ABDOMINAL ULTRASOUND  Comparison:  CT 03/04/2011.  Findings:  Gallbladder:  Biliary sludge is present.  Phrygian cap configuration of the gallbladder.  No sonographic Murphy's sign. Gallbladder wall measures 2 mm, normal.  No pericholecystic fluid.  Common bile duct:  5 mm, normal for age.  Liver:  No focal lesion identified.  Within normal limits in parenchymal echogenicity.  IVC:  Appears normal.   Pancreas:  No focal abnormality seen.  Spleen:  78 mm with normal echotexture.  Right Kidney:  11 cm. Normal echotexture.  Normal central sinus echo  complex.  No calculi or hydronephrosis.  Left Kidney:  11.4 cm.  4 mm renal calculus in the interpolar collecting system.  No hydronephrosis.  Abdominal aorta:  No aneurysm identified.  IMPRESSION:  1.  Biliary sludge with a phrygian cap configuration of the gallbladder.  No findings of acute cholecystitis. 2.  4 mm left nonobstructing renal interpolar calculus.  Original Report Authenticated By: Andreas Newport, M.D.   6:56 AM Advise patient of findings. Symptoms suspicious for biliary colic. Lack of frank gallstones suggest a dysfunctional gallbladder. Patient will followup with her primary care physician and discuss the possibility of a HIDA scan with him.        Hanley Seamen, MD 03/07/11 [phone]

## 2011-03-08 ENCOUNTER — Emergency Department (HOSPITAL_COMMUNITY)
Admission: EM | Admit: 2011-03-08 | Discharge: 2011-03-09 | Disposition: A | Discharge status: Home / Self Care | Payer: BC Managed Care – PPO | Attending: Emergency Medicine | Admitting: Emergency Medicine

## 2011-03-08 ENCOUNTER — Encounter (HOSPITAL_COMMUNITY): Payer: Self-pay | Admitting: *Deleted

## 2011-03-08 DIAGNOSIS — R1011 Right upper quadrant pain: Secondary | ICD-10-CM | POA: Insufficient documentation

## 2011-03-08 DIAGNOSIS — R141 Gas pain: Secondary | ICD-10-CM | POA: Insufficient documentation

## 2011-03-08 DIAGNOSIS — K8020 Calculus of gallbladder without cholecystitis without obstruction: Secondary | ICD-10-CM | POA: Insufficient documentation

## 2011-03-08 DIAGNOSIS — R112 Nausea with vomiting, unspecified: Secondary | ICD-10-CM | POA: Insufficient documentation

## 2011-03-08 DIAGNOSIS — R142 Eructation: Secondary | ICD-10-CM | POA: Insufficient documentation

## 2011-03-08 DIAGNOSIS — K8050 Calculus of bile duct without cholangitis or cholecystitis without obstruction: Secondary | ICD-10-CM

## 2011-03-08 HISTORY — DX: Calculus of gallbladder without cholecystitis without obstruction: K80.20

## 2011-03-08 LAB — COMPREHENSIVE METABOLIC PANEL
ALT: 12 U/L (ref 0–35)
AST: 15 U/L (ref 0–37)
Albumin: 3.8 g/dL (ref 3.5–5.2)
Alkaline Phosphatase: 76 U/L (ref 39–117)
BUN: 10 mg/dL (ref 6–23)
CO2: 26 mEq/L (ref 19–32)
Calcium: 10 mg/dL (ref 8.4–10.5)
Chloride: 101 mEq/L (ref 96–112)
Creatinine, Ser: 0.6 mg/dL (ref 0.50–1.10)
GFR calc Af Amer: >90 mL/min (ref >90)
GFR calc non Af Amer: >90 mL/min (ref >90)
Glucose, Bld: 80 mg/dL (ref 70–99)
Potassium: 3.7 mEq/L (ref 3.5–5.1)
Sodium: 137 mEq/L (ref 135–145)
Total Bilirubin: 0.2 mg/dL — ABNORMAL LOW (ref 0.3–1.2)
Total Protein: 7 g/dL (ref 6.0–8.3)

## 2011-03-08 LAB — CBC
HCT: 39.3 % (ref 36.0–46.0)
Hemoglobin: 13.3 g/dL (ref 12.0–15.0)
MCH: 31.1 pg (ref 26.0–34.0)
MCHC: 33.8 g/dL (ref 30.0–36.0)
MCV: 91.8 fL (ref 78.0–100.0)
Platelets: 227 10*3/uL (ref 150–400)
RBC: 4.28 MIL/uL (ref 3.87–5.11)
RDW: 12.7 % (ref 11.5–15.5)
WBC: 12.5 10*3/uL — ABNORMAL HIGH (ref 4.0–10.5)

## 2011-03-08 LAB — DIFFERENTIAL
Basophils Absolute: 0.1 10*3/uL (ref 0.0–0.1)
Basophils Relative: 1 % (ref 0–1)
Eosinophils Absolute: 0.2 10*3/uL (ref 0.0–0.7)
Eosinophils Relative: 2 % (ref 0–5)
Lymphocytes Relative: 29 % (ref 12–46)
Lymphs Abs: 3.6 10*3/uL (ref 0.7–4.0)
Monocytes Absolute: 0.7 10*3/uL (ref 0.1–1.0)
Monocytes Relative: 5 % (ref 3–12)
Neutro Abs: 7.9 10*3/uL — ABNORMAL HIGH (ref 1.7–7.7)
Neutrophils Relative %: 64 % (ref 43–77)

## 2011-03-08 LAB — LIPASE, BLOOD: Lipase: 39 U/L (ref 11–59)

## 2011-03-08 MED ORDER — HYDROMORPHONE HCL PF 1 MG/ML IJ SOLN
1.0000 mg | Freq: Once | INTRAMUSCULAR | Status: AC
  Administered 2011-03-08: 1 mg via INTRAVENOUS
  Filled 2011-03-08: qty 1

## 2011-03-08 MED ORDER — ONDANSETRON HCL 4 MG/2ML IJ SOLN
4.0000 mg | Freq: Once | INTRAMUSCULAR | Status: AC
  Administered 2011-03-08: 4 mg via INTRAVENOUS
  Filled 2011-03-08: qty 2

## 2011-03-08 NOTE — ED Notes (Signed)
The pt has  Gallstones and she has been here 4 times this week for the same.  Today her pain med is not helping and she had a low grade temp earlier.  Nausea.  lmp hysterectomy

## 2011-03-08 NOTE — ED Notes (Signed)
MD at bedside. 

## 2011-03-08 NOTE — ED Provider Notes (Signed)
History     CSN: [id number]  Arrival date & time 03/08/11  7829   First MD Initiated Contact with Patient 03/08/11 2009      Chief Complaint  Patient presents with  . Abdominal Pain    (Consider location/radiation/quality/duration/timing/severity/associated sxs/prior treatment) HPI Comments: Patient reports recurrent RUQ abdominal pain.  Patient has been to the ED 4 times this week with this pain.  States the pain began 5 days ago.  She has RUQ pain with nausea that occurs 45 minutes after eating. Has also had increased belching and abdominal bloating.  This morning had temperature of 100.0 and vomited once.  States the pain is worse after meals but also worse at night.  Was found to have gallbladder sludge on US performed in ED 1/10, was given outpatient f/u with [facility].  Pt called and scheduled an appointment but is not able to be seen until later this month.  Pt is taking percocet at home after the pain begins but it is not helping. Has had loose stools. Denies urinary symptoms, abnormal vaginal d/c or bleeding.  Pt is s/p partial hysterectomy.    Patient is a 31 y.o. female presenting with abdominal pain. The history is provided by the patient and medical records.  Abdominal Pain The primary symptoms of the illness include abdominal pain, nausea and vomiting. The primary symptoms of the illness do not include dysuria, vaginal discharge or vaginal bleeding.  Symptoms associated with the illness do not include frequency.    Past Medical History  Diagnosis Date  . Migraine   . Kidney stone   . Kidney stones   . Gallstones     Past Surgical History  Procedure Date  . Abdominal hysterectomy   . Tonsillectomy   . Lithotripsy     Family History  Problem Relation Age of Onset  . Diabetes Mother   . Diabetes Father   . Heart failure Father   . Hypertension Father   . Pulmonary fibrosis Father     History  Substance Use Topics  . Smoking status: Former Games developer  . Smokeless  tobacco: Not on file  . Alcohol Use: Yes     occaisional    OB History    Grav Para Term Preterm Abortions TAB SAB Ect Mult Living                  Review of Systems  Gastrointestinal: Positive for nausea, vomiting and abdominal pain.  Genitourinary: Negative for dysuria, frequency, vaginal bleeding, vaginal discharge and vaginal pain.  All other systems reviewed and are negative.    Allergies  Compazine and Reglan  Home Medications   Current Outpatient Rx  Name Route Sig Dispense Refill  . ELETRIPTAN HYDROBROMIDE 40 MG PO TABS Oral One tablet by mouth as needed for migraine headache.  If the headache improves and then returns, dose may be repeated after 2 hours have elapsed since first dose (do not exceed 80 mg per day). may repeat in 2 hours if necessary-for pain    . ESCITALOPRAM OXALATE 20 MG PO TABS Oral Take 20 mg by mouth daily.      Marland Kitchen GABAPENTIN 600 MG PO TABS Oral Take 600 mg by mouth 3 (three) times daily.      Marland Kitchen OMEPRAZOLE 20 MG PO CPDR Oral Take 20 mg by mouth daily.      Marland Kitchen BOTOX IJ Injection Inject 50 Units as directed every 3 (three) months.      . OXYCODONE-ACETAMINOPHEN 5-325 MG  PO TABS Oral Take 1-2 tablets by mouth every 4 (four) hours as needed. For pain    . PROMETHAZINE HCL 25 MG PO TABS Oral Take 25 mg by mouth as needed. For nausea and vomiting     . VITAMIN D (CHOLECALCIFEROL) PO Oral Take 1 tablet by mouth daily.        BP 118/85  Pulse 86  Temp(Src) 97.9 F (36.6 C) (Oral)  Resp 18  SpO2 100%  Physical Exam  Nursing note and vitals reviewed. Constitutional: She is oriented to person, place, and time. She appears well-developed and well-nourished. No distress.  HENT:  Head: Normocephalic and atraumatic.  Neck: Neck supple.  Cardiovascular: Normal rate, regular rhythm and normal heart sounds.   Pulmonary/Chest: Breath sounds normal. No respiratory distress. She has no wheezes. She has no rales. She exhibits no tenderness.  Abdominal: Soft.  Bowel sounds are normal. She exhibits no distension and no mass. There is tenderness in the right upper quadrant. There is no rebound, no guarding and no CVA tenderness.  Musculoskeletal: Normal range of motion.  Neurological: She is alert and oriented to person, place, and time.  Skin: She is not diaphoretic.    ED Course  Procedures (including critical care time)  Labs Reviewed  CBC - Abnormal; Notable for the following:    WBC 12.5 (*)    All other components within normal limits  DIFFERENTIAL - Abnormal; Notable for the following:    Neutro Abs 7.9 (*)    All other components within normal limits  COMPREHENSIVE METABOLIC PANEL - Abnormal; Notable for the following:    Total Bilirubin 0.2 (*)    All other components within normal limits  LIPASE, BLOOD   LFTs normal.   US Abdomen Complete  03/07/2011  *RADIOLOGY REPORT*  Clinical Data:  Right upper quadrant pain.  Cholecystitis.  COMPLETE ABDOMINAL ULTRASOUND  Comparison:  CT 03/04/2011.  Findings:  Gallbladder:  Biliary sludge is present.  Phrygian cap configuration of the gallbladder.  No sonographic Murphy's sign. Gallbladder wall measures 2 mm, normal.  No pericholecystic fluid.  Common bile duct:  5 mm, normal for age.  Liver:  No focal lesion identified.  Within normal limits in parenchymal echogenicity.  IVC:  Appears normal.  Pancreas:  No focal abnormality seen.  Spleen:  78 mm with normal echotexture.  Right Kidney:  11 cm. Normal echotexture.  Normal central sinus echo complex.  No calculi or hydronephrosis.  Left Kidney:  11.4 cm.  4 mm renal calculus in the interpolar collecting system.  No hydronephrosis.  Abdominal aorta:  No aneurysm identified.  IMPRESSION:  1.  Biliary sludge with a phrygian cap configuration of the gallbladder.  No findings of acute cholecystitis. 2.  4 mm left nonobstructing renal interpolar calculus.  Original Report Authenticated By: Andreas Newport, M.D.    10:03 PM I spoke with Dr Janee Morn through  [place], [place] nurse.  Dr Janee Morn suggests that we try to control patient's pain in ED and d/c home if possible.  States she can be seen in the office on Monday.  If we are unable to control her pain, we should call them back.     12:29 AM Patient feeling much better after given 2mg  dilaudid at one time.  Discussed the way she is taking the medications, which is to wait until she cannot tolerate the pain anymore and then tries to take pain medication.    1. Biliary colic       MDM  Patient with multiple ER visits this week to investigate RUQ pain found on last visit to have biliary sludge.  This is thought to be the cause of her pain.  Pt now with worsening symptoms, but controlled in ED.  Discussed patient's presentation, lab, and ultrasound results with Dr Janee Morn through OR nurse Efraim Kaufmann who recommended pain control in ED and f/u in office on Monday.  Patient liked that plan and preferred this rather than admission.  Patient's pain was improved in ED and pain control at home was discussed with new plan to take medications prior to meal instead of waiting for pain to reach its height before starting treatment.  Pt d/c home with pain and nausea medication, f/u with [facility] in 2 days.          Dillard Cannon [place], [place] 03/09/11 [phone]

## 2011-03-09 MED ORDER — DIPHENHYDRAMINE HCL 50 MG/ML IJ SOLN
25.0000 mg | Freq: Once | INTRAMUSCULAR | Status: AC
  Administered 2011-03-09: 25 mg via INTRAVENOUS
  Filled 2011-03-09: qty 1

## 2011-03-09 MED ORDER — PERCOCET 5-325 MG PO TABS: 1.0000 | ORAL_TABLET | ORAL | Status: DC | PRN

## 2011-03-09 NOTE — ED Provider Notes (Signed)
Medical screening examination/treatment/procedure(s) were performed by non-physician practitioner and as supervising physician I was immediately available for consultation/collaboration.  Flint Melter, MD 03/09/11 1030

## 2011-03-10 ENCOUNTER — Inpatient Hospital Stay (HOSPITAL_COMMUNITY)
Admission: EM | Admit: 2011-03-10 | Discharge: 2011-03-12 | DRG: 494 | Disposition: A | Discharge status: Home / Self Care | Payer: BC Managed Care – PPO | Source: Ambulatory Visit | Attending: General Surgery | Admitting: General Surgery

## 2011-03-10 ENCOUNTER — Encounter (HOSPITAL_COMMUNITY): Payer: Self-pay | Admitting: Emergency Medicine

## 2011-03-10 DIAGNOSIS — K811 Chronic cholecystitis: Secondary | ICD-10-CM

## 2011-03-10 DIAGNOSIS — K8010 Calculus of gallbladder with chronic cholecystitis without obstruction: Principal | ICD-10-CM | POA: Diagnosis present

## 2011-03-10 DIAGNOSIS — K8050 Calculus of bile duct without cholangitis or cholecystitis without obstruction: Secondary | ICD-10-CM

## 2011-03-10 LAB — DIFFERENTIAL
Basophils Absolute: 0.1 10*3/uL (ref 0.0–0.1)
Basophils Relative: 1 % (ref 0–1)
Eosinophils Absolute: 0.1 10*3/uL (ref 0.0–0.7)
Eosinophils Relative: 1 % (ref 0–5)
Lymphocytes Relative: 27 % (ref 12–46)
Lymphs Abs: 2.7 10*3/uL (ref 0.7–4.0)
Monocytes Absolute: 0.6 10*3/uL (ref 0.1–1.0)
Monocytes Relative: 6 % (ref 3–12)
Neutro Abs: 6.3 10*3/uL (ref 1.7–7.7)
Neutrophils Relative %: 65 % (ref 43–77)

## 2011-03-10 LAB — COMPREHENSIVE METABOLIC PANEL
ALT: 13 U/L (ref 0–35)
AST: 18 U/L (ref 0–37)
Albumin: 3.9 g/dL (ref 3.5–5.2)
Alkaline Phosphatase: 80 U/L (ref 39–117)
BUN: 9 mg/dL (ref 6–23)
CO2: 25 mEq/L (ref 19–32)
Calcium: 10 mg/dL (ref 8.4–10.5)
Chloride: 104 mEq/L (ref 96–112)
Creatinine, Ser: 0.64 mg/dL (ref 0.50–1.10)
GFR calc Af Amer: >90 mL/min (ref >90)
GFR calc non Af Amer: >90 mL/min (ref >90)
Glucose, Bld: 84 mg/dL (ref 70–99)
Potassium: 4 mEq/L (ref 3.5–5.1)
Sodium: 139 mEq/L (ref 135–145)
Total Bilirubin: 0.3 mg/dL (ref 0.3–1.2)
Total Protein: 7.3 g/dL (ref 6.0–8.3)

## 2011-03-10 LAB — CBC
HCT: 42.2 % (ref 36.0–46.0)
Hemoglobin: 14.2 g/dL (ref 12.0–15.0)
MCH: 30.9 pg (ref 26.0–34.0)
MCHC: 33.6 g/dL (ref 30.0–36.0)
MCV: 91.9 fL (ref 78.0–100.0)
Platelets: 219 10*3/uL (ref 150–400)
RBC: 4.59 MIL/uL (ref 3.87–5.11)
RDW: 12.7 % (ref 11.5–15.5)
WBC: 9.7 10*3/uL (ref 4.0–10.5)

## 2011-03-10 LAB — LIPASE, BLOOD: Lipase: 28 U/L (ref 11–59)

## 2011-03-10 MED ORDER — CIPROFLOXACIN IN D5W 400 MG/200ML IV SOLN
400.0000 mg | INTRAVENOUS | Status: AC
  Administered 2011-03-10: 400 mg via INTRAVENOUS
  Filled 2011-03-10: qty 200

## 2011-03-10 MED ORDER — ONDANSETRON HCL 4 MG/2ML IJ SOLN
INTRAMUSCULAR | Status: AC
  Administered 2011-03-10: 4 mg via INTRAVENOUS
  Filled 2011-03-10: qty 2

## 2011-03-10 MED ORDER — SODIUM CHLORIDE 0.9 % IV BOLUS (SEPSIS)
1000.0000 mL | Freq: Once | INTRAVENOUS | Status: AC
  Administered 2011-03-10: 1000 mL via INTRAVENOUS

## 2011-03-10 MED ORDER — HYDROMORPHONE HCL PF 2 MG/ML IJ SOLN
2.0000 mg | Freq: Once | INTRAMUSCULAR | Status: AC
  Administered 2011-03-10: 2 mg via INTRAVENOUS
  Filled 2011-03-10: qty 1

## 2011-03-10 MED ORDER — PROMETHAZINE HCL 25 MG/ML IJ SOLN
25.0000 mg | Freq: Four times a day (QID) | INTRAMUSCULAR | Status: DC | PRN
  Administered 2011-03-10: 25 mg via INTRAVENOUS
  Filled 2011-03-10: qty 1

## 2011-03-10 MED ORDER — ONDANSETRON HCL 4 MG/2ML IJ SOLN
4.0000 mg | Freq: Once | INTRAMUSCULAR | Status: AC
  Administered 2011-03-10: 4 mg via INTRAVENOUS
  Filled 2011-03-10: qty 2

## 2011-03-10 MED ORDER — HYDROMORPHONE HCL PF 1 MG/ML IJ SOLN
1.0000 mg | Freq: Once | INTRAMUSCULAR | Status: AC
  Administered 2011-03-10: 1 mg via INTRAVENOUS
  Filled 2011-03-10: qty 1

## 2011-03-10 MED ORDER — SODIUM CHLORIDE 0.9 % IV SOLN
INTRAVENOUS | Status: DC
Start: 2011-03-10 — End: 2011-03-11
  Administered 2011-03-10 (×2): via INTRAVENOUS

## 2011-03-10 MED ORDER — ONDANSETRON HCL 4 MG/2ML IJ SOLN
4.0000 mg | Freq: Once | INTRAMUSCULAR | Status: AC
  Administered 2011-03-10: 4 mg via INTRAVENOUS

## 2011-03-10 MED ORDER — HYDROMORPHONE HCL PF 1 MG/ML IJ SOLN
1.0000 mg | INTRAMUSCULAR | Status: DC | PRN
  Administered 2011-03-10 – 2011-03-11 (×3): 1 mg via INTRAVENOUS
  Filled 2011-03-10 (×3): qty 1

## 2011-03-10 NOTE — ED Notes (Signed)
General Surgeon now at bedside.

## 2011-03-10 NOTE — ED Provider Notes (Signed)
History     CSN: [id number]  Arrival date & time 03/10/11  1550   First MD Initiated Contact with Patient 03/10/11 1618      Chief Complaint  Patient presents with  . Abdominal Pain    (Consider location/radiation/quality/duration/timing/severity/associated sxs/prior treatment) HPI.... epigastric and right upper quadrant pain over a couple hours. 5th ED visit this week for "gallbladder" problems.  Review of CT scan and ultrasound shows gallbladder sludge. Also complains of nausea and vomiting. Food makes pain worse. No radiation of pain. Pain is severe  Past Medical History  Diagnosis Date  . Migraine   . Kidney stone   . Kidney stones   . Gallstones     Past Surgical History  Procedure Date  . Abdominal hysterectomy   . Tonsillectomy   . Lithotripsy     Family History  Problem Relation Age of Onset  . Diabetes Mother   . Diabetes Father   . Heart failure Father   . Hypertension Father   . Pulmonary fibrosis Father     History  Substance Use Topics  . Smoking status: Former Games developer  . Smokeless tobacco: Not on file  . Alcohol Use: Yes     occaisional    OB History    Grav Para Term Preterm Abortions TAB SAB Ect Mult Living                  Review of Systems  All other systems reviewed and are negative.    Allergies  Compazine and Reglan  Home Medications   Current Outpatient Rx  Name Route Sig Dispense Refill  . ELETRIPTAN HYDROBROMIDE 40 MG PO TABS Oral One tablet by mouth as needed for migraine headache.  If the headache improves and then returns, dose may be repeated after 2 hours have elapsed since first dose (do not exceed 80 mg per day). may repeat in 2 hours if necessary-for pain    . ESCITALOPRAM OXALATE 20 MG PO TABS Oral Take 20 mg by mouth daily.      Marland Kitchen GABAPENTIN 600 MG PO TABS Oral Take 600 mg by mouth 3 (three) times daily.      Marland Kitchen OMEPRAZOLE 20 MG PO CPDR Oral Take 20 mg by mouth daily.      Marland Kitchen BOTOX IJ Injection Inject 50 Units as  directed every 3 (three) months.      . OXYCODONE-ACETAMINOPHEN 5-325 MG PO TABS Oral Take 1-2 tablets by mouth every 4 (four) hours as needed. For pain    . PROMETHAZINE HCL 25 MG PO TABS Oral Take 25 mg by mouth as needed. For nausea and vomiting     . VITAMIN D (CHOLECALCIFEROL) PO Oral Take 1 tablet by mouth daily.        BP 122/76  Pulse 95  Temp(Src) 100.4 F (38 C) (Oral)  Resp 20  SpO2 99%  Physical Exam  Nursing note and vitals reviewed. Constitutional: She is oriented to person, place, and time. She appears well-developed and well-nourished.  HENT:  Head: Normocephalic and atraumatic.  Eyes: Conjunctivae and EOM are normal. Pupils are equal, round, and reactive to light.  Neck: Normal range of motion. Neck supple.  Cardiovascular: Normal rate and regular rhythm.   Pulmonary/Chest: Effort normal and breath sounds normal.  Abdominal: Soft. Bowel sounds are normal.       Slight epigastric tenderness  Musculoskeletal: Normal range of motion.  Neurological: She is alert and oriented to person, place, and time.  Skin: Skin is  warm and dry.  Psychiatric: She has a normal mood and affect.    ED Course  Procedures (including critical care time) Results for orders placed during the hospital encounter of 03/10/11  CBC      Component Value Range   WBC 9.7  4.0 - 10.5 (K/uL)   RBC 4.59  3.87 - 5.11 (MIL/uL)   Hemoglobin 14.2  12.0 - 15.0 (g/dL)   HCT 30.8  65.7 - 84.6 (%)   MCV 91.9  78.0 - 100.0 (fL)   MCH 30.9  26.0 - 34.0 (pg)   MCHC 33.6  30.0 - 36.0 (g/dL)   RDW 96.2  95.2 - 84.1 (%)   Platelets 219  150 - 400 (K/uL)  DIFFERENTIAL      Component Value Range   Neutrophils Relative 65  43 - 77 (%)   Neutro Abs 6.3  1.7 - 7.7 (K/uL)   Lymphocytes Relative 27  12 - 46 (%)   Lymphs Abs 2.7  0.7 - 4.0 (K/uL)   Monocytes Relative 6  3 - 12 (%)   Monocytes Absolute 0.6  0.1 - 1.0 (K/uL)   Eosinophils Relative 1  0 - 5 (%)   Eosinophils Absolute 0.1  0.0 - 0.7 (K/uL)    Basophils Relative 1  0 - 1 (%)   Basophils Absolute 0.1  0.0 - 0.1 (K/uL)  COMPREHENSIVE METABOLIC PANEL      Component Value Range   Sodium 139  135 - 145 (mEq/L)   Potassium 4.0  3.5 - 5.1 (mEq/L)   Chloride 104  96 - 112 (mEq/L)   CO2 25  19 - 32 (mEq/L)   Glucose, Bld 84  70 - 99 (mg/dL)   BUN 9  6 - 23 (mg/dL)   Creatinine, Ser 3.24  0.50 - 1.10 (mg/dL)   Calcium 40.1  8.4 - 10.5 (mg/dL)   Total Protein 7.3  6.0 - 8.3 (g/dL)   Albumin 3.9  3.5 - 5.2 (g/dL)   AST 18  0 - 37 (U/L)   ALT 13  0 - 35 (U/L)   Alkaline Phosphatase 80  39 - 117 (U/L)   Total Bilirubin 0.3  0.3 - 1.2 (mg/dL)   GFR calc non Af Amer >90  >90 (mL/min)   GFR calc Af Amer >90  >90 (mL/min)  LIPASE, BLOOD      Component Value Range   Lipase 28  11 - 59 (U/L)    Labs Reviewed  CBC  DIFFERENTIAL  COMPREHENSIVE METABOLIC PANEL  LIPASE, BLOOD   No results found.   No diagnosis found.    MDM   Moves CDU for pain management. Discussed with Trixie Dredge PA.  If we cannot get pain under control, we'll consult general surgery       Donnetta Hutching, MD 03/10/11 [phone]

## 2011-03-10 NOTE — ED Provider Notes (Signed)
8:31 PM Patient is in CDU holding for pain relief and admission to hospital for biliary colic.  Patient has been seen in the ED 5 times in 8 days for the same symptoms.  Three days ago, I took care of this patient and contacted Dr Janee Morn, on call general surgeon, who recommended pain control and follow up on Monday in the office.  Patient's pain was controlled in ED and d/c home.  Pt has since been using percocet and phenergan without relief, continues to have have pain, N/V, and fever.  LFTs continue to be normal.  WBC count, previously elevated at 12.5, is now 9.7.  I have paged general surgery to discuss admission for this patient.   8:39 PM I spoke with Dr Janee Morn who will see and admit the patient.   Rise Patience, [place] 03/10/11 2222

## 2011-03-10 NOTE — ED Notes (Addendum)
C/o RUQ pain x 2 hours.  Pt states this is her 5th visit to ED this week for "gallbladder attack."  Scheduled to see surgeon tomorrow. Pt also reports nausea, vomiting, and diarrhea.  States she took Phenergan 2 hours ago that helped with nausea "a little."

## 2011-03-10 NOTE — H&P (Signed)
[name] is an 31 y.o. female.   Chief Complaint: Right upper quadrant abdominal pain HPI: Patient has a history of infrequent episodes of right upper quadrant abdominal pain. This goes back about 2 years. Back then she had a hiatus scan which she reports was normal. Approximately 9 days ago she developed an acute attack of right upper quadrant pain. She was seen in the ED and diagnosed with gallbladder sludge. She was sent home and referred to our office. Since that time she's had multiple attacks and has been back to the ER several times. Last time was 48 hours ago when she was sent home and referred for office. She returns tonight due to another attack of pain. Labs are unremarkable but the patient is unable to tolerate her symptoms at home.  Past Medical History  Diagnosis Date  . Migraine   . Kidney stone   . Kidney stones   . Gallstones     Past Surgical History  Procedure Date  . Abdominal hysterectomy   . Tonsillectomy   . Lithotripsy     Family History  Problem Relation Age of Onset  . Diabetes Mother   . Diabetes Father   . Heart failure Father   . Hypertension Father   . Pulmonary fibrosis Father    Social History:  reports that she has quit smoking. She does not have any smokeless tobacco history on file. She reports that she drinks alcohol. She reports that she does not use illicit drugs.  Allergies:  Allergies  Allergen Reactions  . Compazine Nausea And Vomiting  . Reglan Nausea And Vomiting    Medications Prior to Admission  Medication Dose Route Frequency Provider Last Rate Last Dose  . 0.9 %  sodium chloride infusion   Intravenous Continuous Donnetta Hutching, MD 125 mL/hr at 03/10/11 1938    . diphenhydrAMINE (BENADRYL) injection 25 mg  25 mg Intravenous Once Rise Patience, PA   25 mg at 03/09/11 0045  . HYDROmorphone (DILAUDID) injection 1 mg  1 mg Intravenous Once Donnetta Hutching, MD   1 mg at 03/10/11 1811  . HYDROmorphone (DILAUDID) injection 1 mg  1 mg  Intravenous Once Donnetta Hutching, MD   1 mg at 03/10/11 1929  . HYDROmorphone (DILAUDID) injection 2 mg  2 mg Intravenous Once Donnetta Hutching, MD   2 mg at 03/10/11 1638  . HYDROmorphone (DILAUDID) injection 2 mg  2 mg Intravenous Once Rise Patience, PA   2 mg at 03/10/11 2039  . ondansetron (ZOFRAN) injection 4 mg  4 mg Intravenous Once Donnetta Hutching, MD   4 mg at 03/10/11 1638  . ondansetron (ZOFRAN) injection 4 mg  4 mg Intravenous Once Donnetta Hutching, MD   4 mg at 03/10/11 1658  . ondansetron (ZOFRAN) injection 4 mg  4 mg Intravenous Once Donnetta Hutching, MD   4 mg at 03/10/11 1809  . promethazine (PHENERGAN) injection 25 mg  25 mg Intravenous Q6H PRN Rise Patience, PA   25 mg at 03/10/11 2040  . sodium chloride 0.9 % bolus 1,000 mL  1,000 mL Intravenous Once Donnetta Hutching, MD   1,000 mL at 03/10/11 1638  . sodium chloride 0.9 % bolus 1,000 mL  1,000 mL Intravenous Once Rise Patience, PA   1,000 mL at 03/10/11 2046   Medications Prior to Admission  Medication Sig Dispense Refill  . eletriptan (RELPAX) 40 MG tablet One tablet by mouth as needed for migraine headache.  If the headache improves and  then returns, dose may be repeated after 2 hours have elapsed since first dose (do not exceed 80 mg per day). may repeat in 2 hours if necessary-for pain      . escitalopram (LEXAPRO) 20 MG tablet Take 20 mg by mouth daily.        Marland Kitchen gabapentin (NEURONTIN) 600 MG tablet Take 600 mg by mouth 3 (three) times daily.        Marland Kitchen omeprazole (PRILOSEC) 20 MG capsule Take 20 mg by mouth daily.        . OnabotulinumtoxinA (BOTOX IJ) Inject 50 Units as directed every 3 (three) months.        Marland Kitchen oxyCODONE-acetaminophen (PERCOCET) 5-325 MG per tablet Take 1-2 tablets by mouth every 4 (four) hours as needed. For pain      . promethazine (PHENERGAN) 25 MG tablet Take 25 mg by mouth as needed. For nausea and vomiting       . VITAMIN D, CHOLECALCIFEROL, PO Take 1 tablet by mouth daily.          Results for orders placed during the hospital  encounter of 03/10/11 (from the past 48 hour(s))  CBC     Status: Normal   Collection Time   03/10/11  4:42 PM      Component Value Range Comment   WBC 9.7  4.0 - 10.5 (K/uL)    RBC 4.59  3.87 - 5.11 (MIL/uL)    Hemoglobin 14.2  12.0 - 15.0 (g/dL)    HCT 16.1  09.6 - 04.5 (%)    MCV 91.9  78.0 - 100.0 (fL)    MCH 30.9  26.0 - 34.0 (pg)    MCHC 33.6  30.0 - 36.0 (g/dL)    RDW 40.9  81.1 - 91.4 (%)    Platelets 219  150 - 400 (K/uL)   DIFFERENTIAL     Status: Normal   Collection Time   03/10/11  4:42 PM      Component Value Range Comment   Neutrophils Relative 65  43 - 77 (%)    Neutro Abs 6.3  1.7 - 7.7 (K/uL)    Lymphocytes Relative 27  12 - 46 (%)    Lymphs Abs 2.7  0.7 - 4.0 (K/uL)    Monocytes Relative 6  3 - 12 (%)    Monocytes Absolute 0.6  0.1 - 1.0 (K/uL)    Eosinophils Relative 1  0 - 5 (%)    Eosinophils Absolute 0.1  0.0 - 0.7 (K/uL)    Basophils Relative 1  0 - 1 (%)    Basophils Absolute 0.1  0.0 - 0.1 (K/uL)   COMPREHENSIVE METABOLIC PANEL     Status: Normal   Collection Time   03/10/11  4:42 PM      Component Value Range Comment   Sodium 139  135 - 145 (mEq/L)    Potassium 4.0  3.5 - 5.1 (mEq/L)    Chloride 104  96 - 112 (mEq/L)    CO2 25  19 - 32 (mEq/L)    Glucose, Bld 84  70 - 99 (mg/dL)    BUN 9  6 - 23 (mg/dL)    Creatinine, Ser 7.82  0.50 - 1.10 (mg/dL)    Calcium 95.6  8.4 - 10.5 (mg/dL)    Total Protein 7.3  6.0 - 8.3 (g/dL)    Albumin 3.9  3.5 - 5.2 (g/dL)    AST 18  0 - 37 (U/L)    ALT 13  0 -  35 (U/L)    Alkaline Phosphatase 80  39 - 117 (U/L)    Total Bilirubin 0.3  0.3 - 1.2 (mg/dL)    GFR calc non Af Amer >90  >90 (mL/min)    GFR calc Af Amer >90  >90 (mL/min)   LIPASE, BLOOD     Status: Normal   Collection Time   03/10/11  4:42 PM      Component Value Range Comment   Lipase 28  11 - 59 (U/L)    No results found.  Review of Systems  Constitutional: Negative.   HENT: Negative.   Respiratory: Negative.   Cardiovascular: Negative.     Gastrointestinal: Positive for nausea, vomiting and abdominal pain. Negative for heartburn, diarrhea, constipation and blood in stool.  Genitourinary:       History of multiple kidney stones  Musculoskeletal: Negative.   Skin: Negative.   Neurological: Negative.   Endo/Heme/Allergies: Negative.     Blood pressure 109/67, pulse 76, temperature 100.4 F (38 C), temperature source Oral, resp. rate 18, SpO2 97.00%. Physical Exam  Constitutional: She is oriented to person, place, and time. She appears well-developed and well-nourished. No distress.  HENT:  Head: Normocephalic and atraumatic.  Eyes: Conjunctivae and EOM are normal. Pupils are equal, round, and reactive to light.  Neck: Normal range of motion. Neck supple.  Cardiovascular: Normal rate, regular rhythm, normal heart sounds and intact distal pulses.   Respiratory: Effort normal and breath sounds normal. No respiratory distress. She has no wheezes. She has no rales.  GI: Soft. She exhibits no distension. There is tenderness. There is no rebound and no guarding.       Tenderness in right upper quadrant  Musculoskeletal: Normal range of motion.  Neurological: She is alert and oriented to person, place, and time.     Assessment/Plan Biliary colic with escalating symptoms. We'll plan to admit to the hospital, make n.p.o., place on antibiotics, and plan cholecystectomy this admission. Will likely be over the next 24-48 hours. Procedure of laparoscopic cholecystectomy was discussed in detail the patient and her husband. Questions were answered. She is agreeable.  Annalicia Renfrew E 03/10/2011, 9:39 PM

## 2011-03-11 ENCOUNTER — Encounter (HOSPITAL_COMMUNITY): Admission: EM | Disposition: A | Discharge status: Home / Self Care | Payer: Self-pay | Source: Ambulatory Visit

## 2011-03-11 ENCOUNTER — Inpatient Hospital Stay (HOSPITAL_COMMUNITY): Payer: BC Managed Care – PPO | Admitting: Certified Registered"

## 2011-03-11 ENCOUNTER — Other Ambulatory Visit (INDEPENDENT_AMBULATORY_CARE_PROVIDER_SITE_OTHER): Payer: Self-pay | Admitting: General Surgery

## 2011-03-11 ENCOUNTER — Encounter (HOSPITAL_COMMUNITY): Payer: Self-pay | Admitting: Certified Registered"

## 2011-03-11 ENCOUNTER — Inpatient Hospital Stay (HOSPITAL_COMMUNITY): Payer: BC Managed Care – PPO

## 2011-03-11 ENCOUNTER — Encounter (HOSPITAL_COMMUNITY): Payer: Self-pay | Admitting: *Deleted

## 2011-03-11 HISTORY — PX: CHOLECYSTECTOMY: SHX55

## 2011-03-11 LAB — COMPREHENSIVE METABOLIC PANEL
ALT: 12 U/L (ref 0–35)
AST: 16 U/L (ref 0–37)
Albumin: 3.3 g/dL — ABNORMAL LOW (ref 3.5–5.2)
Alkaline Phosphatase: 70 U/L (ref 39–117)
BUN: 6 mg/dL (ref 6–23)
CO2: 26 mEq/L (ref 19–32)
Calcium: 9.1 mg/dL (ref 8.4–10.5)
Chloride: 104 mEq/L (ref 96–112)
Creatinine, Ser: 0.69 mg/dL (ref 0.50–1.10)
GFR calc Af Amer: >90 mL/min (ref >90)
GFR calc non Af Amer: >90 mL/min (ref >90)
Glucose, Bld: 79 mg/dL (ref 70–99)
Potassium: 3.6 mEq/L (ref 3.5–5.1)
Sodium: 139 mEq/L (ref 135–145)
Total Bilirubin: 0.4 mg/dL (ref 0.3–1.2)
Total Protein: 6.2 g/dL (ref 6.0–8.3)

## 2011-03-11 LAB — CBC
HCT: 38.8 % (ref 36.0–46.0)
Hemoglobin: 12.5 g/dL (ref 12.0–15.0)
MCH: 30.3 pg (ref 26.0–34.0)
MCHC: 32.2 g/dL (ref 30.0–36.0)
MCV: 93.9 fL (ref 78.0–100.0)
Platelets: 210 10*3/uL (ref 150–400)
RBC: 4.13 MIL/uL (ref 3.87–5.11)
RDW: 12.5 % (ref 11.5–15.5)
WBC: 8.8 10*3/uL (ref 4.0–10.5)

## 2011-03-11 LAB — SURGICAL PCR SCREEN
MRSA, PCR: NEGATIVE
Staphylococcus aureus: NEGATIVE

## 2011-03-11 SURGERY — LAPAROSCOPIC CHOLECYSTECTOMY WITH INTRAOPERATIVE CHOLANGIOGRAM
Anesthesia: General | Site: Abdomen | Wound class: Clean Contaminated

## 2011-03-11 MED ORDER — MEPERIDINE HCL 25 MG/ML IJ SOLN: 6.2500 mg | INTRAMUSCULAR | Status: DC | PRN

## 2011-03-11 MED ORDER — NEOSTIGMINE METHYLSULFATE 1 MG/ML IJ SOLN
INTRAMUSCULAR | Status: DC | PRN
  Administered 2011-03-11: 4 mg via INTRAVENOUS

## 2011-03-11 MED ORDER — CIPROFLOXACIN IN D5W 400 MG/200ML IV SOLN
400.0000 mg | Freq: Two times a day (BID) | INTRAVENOUS | Status: DC
  Administered 2011-03-11: 400 mg via INTRAVENOUS
  Filled 2011-03-11 (×2): qty 200

## 2011-03-11 MED ORDER — LACTATED RINGERS IV SOLN
INTRAVENOUS | Status: DC | PRN
  Administered 2011-03-11 (×2): via INTRAVENOUS

## 2011-03-11 MED ORDER — ONDANSETRON HCL 4 MG/2ML IJ SOLN
INTRAMUSCULAR | Status: DC | PRN
  Administered 2011-03-11: 4 mg via INTRAVENOUS

## 2011-03-11 MED ORDER — ROCURONIUM BROMIDE 100 MG/10ML IV SOLN
INTRAVENOUS | Status: DC | PRN
  Administered 2011-03-11: 50 mg via INTRAVENOUS

## 2011-03-11 MED ORDER — PROPOFOL 10 MG/ML IV EMUL
INTRAVENOUS | Status: DC | PRN
  Administered 2011-03-11: 180 mg via INTRAVENOUS

## 2011-03-11 MED ORDER — GABAPENTIN 600 MG PO TABS
600.0000 mg | ORAL_TABLET | Freq: Three times a day (TID) | ORAL | Status: DC
  Administered 2011-03-11 (×2): 600 mg via ORAL
  Filled 2011-03-11 (×6): qty 1

## 2011-03-11 MED ORDER — DIPHENHYDRAMINE HCL 50 MG/ML IJ SOLN
25.0000 mg | Freq: Four times a day (QID) | INTRAMUSCULAR | Status: DC | PRN
  Administered 2011-03-11 (×2): 25 mg via INTRAVENOUS
  Filled 2011-03-11: qty 1

## 2011-03-11 MED ORDER — ENOXAPARIN SODIUM 40 MG/0.4ML ~~LOC~~ SOLN
40.0000 mg | SUBCUTANEOUS | Status: DC
  Administered 2011-03-12: 40 mg via SUBCUTANEOUS
  Filled 2011-03-11 (×2): qty 0.4

## 2011-03-11 MED ORDER — HYDROMORPHONE HCL PF 1 MG/ML IJ SOLN
1.0000 mg | INTRAMUSCULAR | Status: DC | PRN
  Administered 2011-03-11 – 2011-03-12 (×4): 1 mg via INTRAVENOUS
  Filled 2011-03-11 (×5): qty 1

## 2011-03-11 MED ORDER — 0.9 % SODIUM CHLORIDE (POUR BTL) OPTIME
TOPICAL | Status: DC | PRN
  Administered 2011-03-11: 1000 mL

## 2011-03-11 MED ORDER — ONDANSETRON HCL 4 MG/2ML IJ SOLN: 4.0000 mg | Freq: Once | INTRAMUSCULAR | Status: DC | PRN

## 2011-03-11 MED ORDER — ELETRIPTAN HYDROBROMIDE 40 MG PO TABS
40.0000 mg | ORAL_TABLET | Freq: Two times a day (BID) | ORAL | Status: DC | PRN
  Filled 2011-03-11: qty 1

## 2011-03-11 MED ORDER — FENTANYL CITRATE 0.05 MG/ML IJ SOLN
INTRAMUSCULAR | Status: DC | PRN
  Administered 2011-03-11: 50 ug via INTRAVENOUS
  Administered 2011-03-11 (×2): 100 ug via INTRAVENOUS

## 2011-03-11 MED ORDER — LACTATED RINGERS IV SOLN
INTRAVENOUS | Status: DC
  Administered 2011-03-11: 12:00:00 via INTRAVENOUS

## 2011-03-11 MED ORDER — DROPERIDOL 2.5 MG/ML IJ SOLN
INTRAMUSCULAR | Status: DC | PRN
  Administered 2011-03-11: 0.625 mg via INTRAVENOUS

## 2011-03-11 MED ORDER — OXYCODONE-ACETAMINOPHEN 5-325 MG PO TABS
1.0000 | ORAL_TABLET | ORAL | Status: DC | PRN
  Administered 2011-03-11 – 2011-03-12 (×3): 2 via ORAL
  Filled 2011-03-11 (×3): qty 2

## 2011-03-11 MED ORDER — IOHEXOL 300 MG/ML  SOLN
INTRAMUSCULAR | Status: DC | PRN
  Administered 2011-03-11: 20 mL

## 2011-03-11 MED ORDER — PANTOPRAZOLE SODIUM 40 MG IV SOLR
40.0000 mg | Freq: Every day | INTRAVENOUS | Status: DC
  Administered 2011-03-11: 40 mg via INTRAVENOUS
  Filled 2011-03-11 (×2): qty 40

## 2011-03-11 MED ORDER — KCL IN DEXTROSE-NACL 20-5-0.45 MEQ/L-%-% IV SOLN
INTRAVENOUS | Status: DC
  Administered 2011-03-11: 01:00:00 via INTRAVENOUS
  Administered 2011-03-11 (×2): 1000 mL via INTRAVENOUS
  Administered 2011-03-12: 01:00:00 via INTRAVENOUS
  Filled 2011-03-11 (×6): qty 1000

## 2011-03-11 MED ORDER — MIDAZOLAM HCL 5 MG/5ML IJ SOLN
INTRAMUSCULAR | Status: DC | PRN
  Administered 2011-03-11: 2 mg via INTRAVENOUS

## 2011-03-11 MED ORDER — PROMETHAZINE HCL 25 MG/ML IJ SOLN
12.5000 mg | Freq: Four times a day (QID) | INTRAMUSCULAR | Status: DC | PRN
  Administered 2011-03-11 – 2011-03-12 (×2): 12.5 mg via INTRAVENOUS
  Filled 2011-03-11 (×3): qty 1

## 2011-03-11 MED ORDER — DEXAMETHASONE SODIUM PHOSPHATE 4 MG/ML IJ SOLN
INTRAMUSCULAR | Status: DC | PRN
  Administered 2011-03-11: 8 mg via INTRAVENOUS

## 2011-03-11 MED ORDER — HYDROMORPHONE HCL PF 1 MG/ML IJ SOLN
0.2500 mg | INTRAMUSCULAR | Status: DC | PRN
  Administered 2011-03-11 (×2): 0.25 mg via INTRAVENOUS
  Administered 2011-03-11: 0.5 mg via INTRAVENOUS

## 2011-03-11 MED ORDER — DIPHENHYDRAMINE HCL 50 MG/ML IJ SOLN
INTRAMUSCULAR | Status: AC
  Filled 2011-03-11: qty 1

## 2011-03-11 MED ORDER — ESCITALOPRAM OXALATE 20 MG PO TABS
20.0000 mg | ORAL_TABLET | Freq: Every day | ORAL | Status: DC
  Administered 2011-03-11: 20 mg via ORAL
  Filled 2011-03-11 (×2): qty 1

## 2011-03-11 MED ORDER — GLYCOPYRROLATE 0.2 MG/ML IJ SOLN
INTRAMUSCULAR | Status: DC | PRN
  Administered 2011-03-11: .4 mg via INTRAVENOUS

## 2011-03-11 MED ORDER — BUPIVACAINE-EPINEPHRINE 0.25% -1:200000 IJ SOLN
INTRAMUSCULAR | Status: DC | PRN
  Administered 2011-03-11: 26 mL

## 2011-03-11 SURGICAL SUPPLY — 46 items
APPLIER CLIP 5 13 M/L LIGAMAX5 (MISCELLANEOUS) ×2
BANDAGE ADHESIVE 1X3 (GAUZE/BANDAGES/DRESSINGS) ×6 IMPLANT
BENZOIN TINCTURE PRP APPL 2/3 (GAUZE/BANDAGES/DRESSINGS) ×2 IMPLANT
BLADE SURG ROTATE 9660 (MISCELLANEOUS) IMPLANT
CANISTER SUCTION 2500CC (MISCELLANEOUS) ×2 IMPLANT
CHLORAPREP W/TINT 26ML (MISCELLANEOUS) ×2 IMPLANT
CLIP APPLIE 5 13 M/L LIGAMAX5 (MISCELLANEOUS) ×1 IMPLANT
CLOTH BEACON ORANGE TIMEOUT ST (SAFETY) ×2 IMPLANT
COVER MAYO STAND STRL (DRAPES) ×2 IMPLANT
COVER SURGICAL LIGHT HANDLE (MISCELLANEOUS) ×2 IMPLANT
DECANTER SPIKE VIAL GLASS SM (MISCELLANEOUS) ×2 IMPLANT
DERMABOND ADHESIVE PROPEN (GAUZE/BANDAGES/DRESSINGS) ×1
DERMABOND ADVANCED .7 DNX6 (GAUZE/BANDAGES/DRESSINGS) ×1 IMPLANT
DRAPE C-ARM 42X72 X-RAY (DRAPES) ×2 IMPLANT
DRAPE UTILITY 15X26 W/TAPE STR (DRAPE) ×4 IMPLANT
DRSG TEGADERM 4X4.75 (GAUZE/BANDAGES/DRESSINGS) ×2 IMPLANT
ELECT REM PT RETURN 9FT ADLT (ELECTROSURGICAL) ×2
ELECTRODE REM PT RTRN 9FT ADLT (ELECTROSURGICAL) ×1 IMPLANT
GAUZE SPONGE 2X2 8PLY STRL LF (GAUZE/BANDAGES/DRESSINGS) ×1 IMPLANT
GLOVE BIO SURGEON STRL SZ7 (GLOVE) ×2 IMPLANT
GLOVE BIO SURGEON STRL SZ7.5 (GLOVE) ×6 IMPLANT
GLOVE BIOGEL PI IND STRL 8 (GLOVE) ×1 IMPLANT
GLOVE BIOGEL PI INDICATOR 8 (GLOVE) ×1
GLOVE ECLIPSE 6.5 STRL STRAW (GLOVE) ×2 IMPLANT
GLOVE ECLIPSE 7.5 STRL STRAW (GLOVE) ×2 IMPLANT
GOWN STRL NON-REIN LRG LVL3 (GOWN DISPOSABLE) ×6 IMPLANT
GOWN STRL REIN XL XLG (GOWN DISPOSABLE) ×2 IMPLANT
KIT BASIN OR (CUSTOM PROCEDURE TRAY) ×2 IMPLANT
KIT ROOM TURNOVER OR (KITS) ×2 IMPLANT
NS IRRIG 1000ML POUR BTL (IV SOLUTION) ×2 IMPLANT
PAD ARMBOARD 7.5X6 YLW CONV (MISCELLANEOUS) ×4 IMPLANT
POUCH SPECIMEN RETRIEVAL 10MM (ENDOMECHANICALS) ×2 IMPLANT
SCISSORS LAP 5X35 DISP (ENDOMECHANICALS) IMPLANT
SET CHOLANGIOGRAPH 5 50 .035 (SET/KITS/TRAYS/PACK) ×2 IMPLANT
SET IRRIG TUBING LAPAROSCOPIC (IRRIGATION / IRRIGATOR) ×2 IMPLANT
SLEEVE ENDOPATH XCEL 5M (ENDOMECHANICALS) ×4 IMPLANT
SPECIMEN JAR SMALL (MISCELLANEOUS) ×2 IMPLANT
SPONGE GAUZE 2X2 STER 10/PKG (GAUZE/BANDAGES/DRESSINGS) ×1
SUT MNCRL AB 4-0 PS2 18 (SUTURE) ×2 IMPLANT
SUT VICRYL 0 UR6 27IN ABS (SUTURE) ×2 IMPLANT
TOWEL OR 17X24 6PK STRL BLUE (TOWEL DISPOSABLE) ×2 IMPLANT
TOWEL OR 17X26 10 PK STRL BLUE (TOWEL DISPOSABLE) ×2 IMPLANT
TRAY LAPAROSCOPIC (CUSTOM PROCEDURE TRAY) ×2 IMPLANT
TROCAR XCEL BLUNT TIP 100MML (ENDOMECHANICALS) ×2 IMPLANT
TROCAR XCEL NON-BLD 5MMX100MML (ENDOMECHANICALS) ×2 IMPLANT
WATER STERILE IRR 1000ML POUR (IV SOLUTION) IMPLANT

## 2011-03-11 NOTE — Preoperative (Signed)
Beta Blockers   Reason not to administer Beta Blockers:Not Applicable 

## 2011-03-11 NOTE — ED Provider Notes (Signed)
Medical screening examination/treatment/procedure(s) were conducted as a shared visit with non-physician practitioner(s) and myself.  I personally evaluated the patient during the encounter.   Seen by me. Multiple ED visits for biliary colic. Admit  Donnetta Hutching, MD 03/11/11 [phone]

## 2011-03-11 NOTE — Anesthesia Preprocedure Evaluation (Addendum)
Anesthesia Evaluation  Patient identified by MRN, date of birth, ID band Patient awake    Reviewed: Allergy & Precautions, H&P , NPO status , Patient's Chart, lab work & pertinent test results, reviewed documented beta blocker date and time   Airway Mallampati: I TM Distance: >3 FB Neck ROM: full    Dental   Pulmonary          Cardiovascular     Neuro/Psych    GI/Hepatic   Endo/Other    Renal/GU      Musculoskeletal   Abdominal   Peds  Hematology   Anesthesia Other Findings   Reproductive/Obstetrics                          Anesthesia Physical Anesthesia Plan  ASA: II  Anesthesia Plan: General ETT   Post-op Pain Management:    Induction:   Airway Management Planned:   Additional Equipment:   Intra-op Plan:   Post-operative Plan:   Informed Consent: I have reviewed the patients History and Physical, chart, labs and discussed the procedure including the risks, benefits and alternatives for the proposed anesthesia with the patient or authorized representative who has indicated his/her understanding and acceptance.     Plan Discussed with: CRNA and Surgeon  Anesthesia Plan Comments:         Anesthesia Quick Evaluation

## 2011-03-11 NOTE — Op Note (Signed)
Laparoscopic Cholecystectomy with IOC Procedure Note  Indications: This patient presents with symptomatic gallbladder disease and will undergo laparoscopic cholecystectomy.  Pre-operative Diagnosis: Biliary Colic; right upper quadrant pain  Post-operative Diagnosis: Same  Surgeon: Atilano Ina   Assistants: Will Marlyne Beards, PA-C  Anesthesia: General endotracheal anesthesia  ASA Class: 2  Procedure Details  The patient was seen again in the Holding Room. The risks, benefits, complications, treatment options, and expected outcomes were discussed with the patient. The possibilities of reaction to medication, pulmonary aspiration, perforation of viscus, bleeding, recurrent infection, finding a normal gallbladder, the need for additional procedures, failure to diagnose a condition, the possible need to convert to an open procedure, and creating a complication requiring transfusion or operation were discussed with the patient. The likelihood of improving the patient's symptoms with return to their baseline status is good.  The patient and/or family concurred with the proposed plan, giving informed consent. The site of surgery properly noted. The patient was taken to Operating Room, identified as [name] and the procedure verified as Laparoscopic Cholecystectomy with Intraoperative Cholangiogram. A Time Out was held and the above information confirmed.  Prior to the induction of general anesthesia, antibiotic prophylaxis was administered. General endotracheal anesthesia was then administered and tolerated well. After the induction, the abdomen was prepped with Chloraprep and draped in the sterile fashion. The patient was positioned in the supine position.  Local anesthetic agent was injected into the skin near the umbilicus and an incision made. We dissected down to the abdominal fascia with blunt dissection.  The fascia was incised vertically and we entered the peritoneal cavity bluntly.  A  pursestring suture of 0-Vicryl was placed around the fascial opening.  The Hasson cannula was inserted and secured with the stay suture.  Pneumoperitoneum was then created with CO2 and tolerated well without any adverse changes in the patient's vital signs. An 5-mm port was placed in the subxiphoid position.  Two 5-mm ports were placed in the right upper quadrant. All skin incisions were infiltrated with a local anesthetic agent before making the incision and placing the trocars.   We positioned the patient in reverse Trendelenburg, tilted slightly to the patient's left.  The gallbladder was identified, the fundus grasped and retracted cephalad. Adhesions were lysed bluntly and with the electrocautery where indicated, taking care not to injure any adjacent organs or viscus. The infundibulum was grasped and retracted laterally, exposing the peritoneum overlying the triangle of Calot. This was then divided and exposed in a blunt fashion. A critical view of the cystic duct and cystic artery was obtained.  The cystic duct was clearly identified and bluntly dissected circumferentially. The cystic duct was ligated with a clip distally.   An incision was made in the cystic duct and the [facility] cholangiogram catheter introduced. The catheter was secured using a clip. A cholangiogram was then obtained which showed good visualization of the distal and proximal biliary tree with no sign of filling defects or obstruction.  Contrast flowed easily into the duodenum. The catheter was then removed.   The cystic duct was then ligated with clips and divided. The cystic artery was identified, dissected free, ligated with clips and divided as well.   The gallbladder was dissected from the liver bed in retrograde fashion with the electrocautery. The gallbladder was removed and placed in an Endocatch sac. The liver bed was irrigated and inspected. Hemostasis was achieved with the electrocautery. Copious irrigation was utilized and was  repeatedly aspirated until clear.  The gallbladder  and Endocatch sac were then removed through the umbilical port site.  The pursestring suture was used to close the umbilical fascia.    We again inspected the right upper quadrant for hemostasis.  Pneumoperitoneum was released as we removed the trocars.  4-0 Monocryl was used to close the skin.   Benzoin, steri-strips, and clean dressings were applied. The patient was then extubated and brought to the recovery room in stable condition. Instrument, sponge, and needle counts were correct at closure and at the conclusion of the case.   Findings: A few omental adhesions. Normal appearing Gallbladder.   Estimated Blood Loss: Minimal         Drains: none         Specimens: Gallbladder           Complications: None; patient tolerated the procedure well.         Disposition: PACU - hemodynamically stable.         Condition: stable  [name]. Sophia Campanile, MD, FACS General, Bariatric, & Minimally Invasive Surgery [facility] Surgery, [place]

## 2011-03-11 NOTE — Transfer of Care (Signed)
Immediate Anesthesia Transfer of Care Note  Patient: [name]  Procedure(s) Performed:  LAPAROSCOPIC CHOLECYSTECTOMY WITH INTRAOPERATIVE CHOLANGIOGRAM  Patient Location: PACU  Anesthesia Type: General  Level of Consciousness: awake, alert , oriented and patient cooperative  Airway & Oxygen Therapy: Patient Spontanous Breathing and Patient connected to nasal cannula oxygen  Post-op Assessment: Report given to PACU RN, Post -op Vital signs reviewed and stable and Patient moving all extremities  Post vital signs: Reviewed and stable Filed Vitals:   03/11/11 1000  BP: 88/55  Pulse: 65  Temp: 36.8 C  Resp: 16    Complications: No apparent anesthesia complications

## 2011-03-11 NOTE — Plan of Care (Signed)
Problem: Diagnosis - Type of Surgery Goal: General Surgical Patient Education (See Patient Education module for education specifics) Outcome: Completed/Met Date Met:  03/11/11 Pre-op teaching

## 2011-03-11 NOTE — Anesthesia Postprocedure Evaluation (Signed)
  Anesthesia Post-op Note  Patient: [name]  Procedure(s) Performed:  LAPAROSCOPIC CHOLECYSTECTOMY WITH INTRAOPERATIVE CHOLANGIOGRAM  Patient Location: PACU  Anesthesia Type: General  Level of Consciousness: awake  Airway and Oxygen Therapy: Patient connected to nasal cannula oxygen  Post-op Pain: none  Post-op Assessment: Post-op Vital signs reviewed  Post-op Vital Signs: stable  Complications: No apparent anesthesia complications

## 2011-03-11 NOTE — Progress Notes (Signed)
  Subjective: Still c/o some pain on rt side radiating to back/shoulder. Some nausea. But overall pain is better.   Objective: Vital signs in last 24 hours: Temp:  [98.3 F (36.8 C)-100.4 F (38 C)] 98.3 F (36.8 C) (01/14 0605) Pulse Rate:  [60-95] 61  (01/14 0605) Resp:  [15-20] 18  (01/14 0605) BP: (96-122)/(58-82) 96/58 mmHg (01/14 0605) SpO2:  [97 %-99 %] 97 % (01/14 0605) Weight:  [210 lb (95.255 kg)] 210 lb (95.255 kg) (01/14 0034) Last BM Date: 03/10/11  Intake/Output from previous day: 01/13 0701 - 01/14 0700 In: 678 [I.V.:678] Out: 250 [Urine:250] Intake/Output this shift: Total I/O In: -  Out: 1000 [Urine:1000]  Alert, nad, reading a book cta Reg Obese, soft, mild RUQ ttp. No RT. No guarding. +bs. Well healed trocar incision No jaundice/scleral icterus  Lab Results:   [facility] 03/11/11 0638 03/10/11 1642  WBC 8.8 9.7  HGB 12.5 14.2  HCT 38.8 42.2  PLT 210 219   BMET  Basename 03/11/11 0638 03/10/11 1642  NA 139 139  K 3.6 4.0  CL 104 104  CO2 26 25  GLUCOSE 79 84  BUN 6 9  CREATININE 0.69 0.64  CALCIUM 9.1 10.0   PT/INR No results found for this basename: LABPROT:2,INR:2 in the last 72 hours ABG No results found for this basename: PHART:2,PCO2:2,PO2:2,HCO3:2 in the last 72 hours  Studies/Results: No results found.  Anti-infectives: Anti-infectives     Start     Dose/Rate Route Frequency Ordered Stop   03/11/11 1000   ciprofloxacin (CIPRO) IVPB 400 mg        400 mg 200 mL/hr over 60 Minutes Intravenous Every 12 hours 03/11/11 0029     03/10/11 2215   ciprofloxacin (CIPRO) IVPB 400 mg        400 mg 200 mL/hr over 60 Minutes Intravenous NOW 03/10/11 2211 03/10/11 2320          Assessment/Plan: RUQ pain likely biliary colic. I believe her symptoms are most consistent with gallbladder disease; however, we did discuss about a 5-10% chance of no improvement of her symptoms with cholecystectomy  We discussed gallbladder disease. The  patient was given Agricultural engineer. We discussed non-operative and operative management.   I discussed laparoscopic cholecystectomy with IOC in detail.   We discussed the risks and benefits of a laparoscopic cholecystectomy including, but not limited to bleeding, infection, injury to surrounding structures such as the intestine or liver, bile leak, retained gallstones, need to convert to an open procedure, prolonged diarrhea, blood clots such as  DVT, common bile duct injury, anesthesia risks, and possible need for additional procedures.  We discussed the typical post-operative recovery course. I explained that the likelihood of improvement of their symptoms is good.  The pt has elected to proceed with surgery.  Mary Sella. Andrey Campanile, MD, FACS General, Bariatric, & Minimally Invasive Surgery [facility], [place]    LOS: 1 day    Atilano Ina 03/11/2011

## 2011-03-12 ENCOUNTER — Encounter (HOSPITAL_COMMUNITY): Payer: Self-pay | Admitting: General Surgery

## 2011-03-12 MED ORDER — OXYCODONE-ACETAMINOPHEN 5-325 MG PO TABS: 1.0000 | ORAL_TABLET | ORAL | Status: AC | PRN

## 2011-03-12 NOTE — Progress Notes (Signed)
BP 102/58  Pulse 69  Temp(Src) 97.9 F (36.6 C) (Oral)  Resp 20  Ht 5\' 7"  (1.702 m)  Wt 95.255 kg (210 lb)  BMI 32.89 kg/m2  SpO2 96% Pt ok, no c/o, tol diet, walking, voiding, pain control good Ready for DC, see Summary.

## 2011-03-12 NOTE — Discharge Summary (Signed)
Physician Discharge Summary  Patient ID: [name] MRN: [id number] DOB/AGE: 33-Dec-1982 33 y.o.  Admit date: 03/10/2011 Discharge date: 03/12/2011  Admission Diagnoses: Cholecystitis Discharge Diagnoses:  Active Problems: Cholecystitis  Procedure(s): LAPAROSCOPIC CHOLECYSTECTOMY WITH INTRAOPERATIVE CHOLANGIOGRAM  Discharged Condition: good  Hospital Course:  HPI: Patient has a history of infrequent episodes of right upper quadrant abdominal pain. This goes back about 2 years. Back then she had a hiatus scan which she reports was normal. Approximately 9 days ago she developed an acute attack of right upper quadrant pain. She was seen in the ED and diagnosed with gallbladder sludge. She was sent home and referred to our office. Since that time she's had multiple attacks and has been back to the ER several times. Last time was 48 hours ago when she was sent home and referred for office. She returns tonight due to another attack of pain. Labs are unremarkable but the patient is unable to tolerate her symptoms at home. She was admitted by Dr. Janee Morn on 03/10/11 for the above. She was taken to the OR on 1/14 and underwent lap chole. No intra-op or post-op issues were encountered. She is stable for discharge.  Consults: none   Discharge Exam: Blood pressure 102/58, pulse 69, temperature 97.9 F (36.6 C), temperature source Oral, resp. rate 20, height 5\' 7"  (1.702 m), weight 95.255 kg (210 lb), SpO2 96.00%. Lungs: CTA without w/r/r Heart: Regular Abdomen: soft, ND, appropriately tender   Incisions all c/d/i without erythema or hematoma. Ext: No edema or tenderness   Disposition: Home or Self Care  Discharge Orders    Future Appointments: Provider: Department: Dept Phone: Center:   03/21/2011 10:20 AM Wilmon Arms. Tsuei, MD [facility] [phone] None     Future Orders Please Complete By Expires   Diet - low sodium heart healthy      Increase activity slowly      May walk up  steps      May shower / Bathe      No dressing needed      Call MD for:  redness, tenderness, or signs of infection (pain, swelling, redness, odor or green/yellow discharge around incision site)      Call MD for:  severe uncontrolled pain      Call MD for:  persistant nausea and vomiting      Call MD for:  temperature >100.4        Discharge Medication List as of 03/12/2011  9:01 AM    CONTINUE these medications which have CHANGED   Details  oxyCODONE-acetaminophen (PERCOCET) 5-325 MG per tablet Take 1-2 tablets by mouth every 4 (four) hours as needed. For pain, Starting 03/12/2011, Until Fri 03/22/11, Print      CONTINUE these medications which have NOT CHANGED   Details  eletriptan (RELPAX) 40 MG tablet One tablet by mouth as needed for migraine headache.  If the headache improves and then returns, dose may be repeated after 2 hours have elapsed since first dose (do not exceed 80 mg per day). may repeat in 2 hours if necessary-for pain, Until Discontinu ed, Historical Med    escitalopram (LEXAPRO) 20 MG tablet Take 20 mg by mouth daily.  , Until Discontinued, Historical Med    gabapentin (NEURONTIN) 600 MG tablet Take 600 mg by mouth 3 (three) times daily.  , Until Discontinued, Historical Med    omeprazole (PRILOSEC) 20 MG capsule Take 20 mg by mouth daily.  , Until Discontinued, Historical Med    OnabotulinumtoxinA (BOTOX  IJ) Inject 50 Units as directed every 3 (three) months. , Until Discontinued, Historical Med    promethazine (PHENERGAN) 25 MG tablet Take 25 mg by mouth as needed. For nausea and vomiting , Until Discontinued, Historical Med    VITAMIN D, CHOLECALCIFEROL, PO Take 1 tablet by mouth daily. , Until Discontinued, Historical Med       Follow-up Information    Follow up with [facility] on 03/26/2011. (DOW clinic 1:40 pm)    Contact information:   [facility] [address] [place] [address] [phone]           Signed: Marianna Fuss 03/12/2011, 9:40 AM

## 2011-03-14 NOTE — Progress Notes (Signed)
Sophia Dehart M. Massie Mees, MD, FACS General, Bariatric, & Minimally Invasive Surgery [facility] [place]  

## 2011-03-14 NOTE — Discharge Summary (Signed)
Yalda Herd M. Kellye Mizner, MD, FACS General, Bariatric, & Minimally Invasive Surgery [facility] [place]  

## 2011-03-21 ENCOUNTER — Ambulatory Visit (INDEPENDENT_AMBULATORY_CARE_PROVIDER_SITE_OTHER): Payer: BC Managed Care – PPO | Admitting: Surgery

## 2011-03-22 ENCOUNTER — Emergency Department (HOSPITAL_COMMUNITY)
Admission: EM | Admit: 2011-03-22 | Discharge: 2011-03-22 | Disposition: A | Discharge status: Home / Self Care | Payer: BC Managed Care – PPO | Attending: Emergency Medicine | Admitting: Emergency Medicine

## 2011-03-22 ENCOUNTER — Encounter (HOSPITAL_COMMUNITY): Payer: Self-pay | Admitting: Emergency Medicine

## 2011-03-22 DIAGNOSIS — S61209A Unspecified open wound of unspecified finger without damage to nail, initial encounter: Secondary | ICD-10-CM | POA: Insufficient documentation

## 2011-03-22 DIAGNOSIS — M79609 Pain in unspecified limb: Secondary | ICD-10-CM | POA: Insufficient documentation

## 2011-03-22 DIAGNOSIS — IMO0001 Reserved for inherently not codable concepts without codable children: Secondary | ICD-10-CM | POA: Insufficient documentation

## 2011-03-22 DIAGNOSIS — S61259A Open bite of unspecified finger without damage to nail, initial encounter: Secondary | ICD-10-CM

## 2011-03-22 MED ORDER — TETANUS-DIPHTH-ACELL PERTUSSIS 5-2.5-18.5 LF-MCG/0.5 IM SUSP
0.5000 mL | Freq: Once | INTRAMUSCULAR | Status: AC
  Administered 2011-03-22: 0.5 mL via INTRAMUSCULAR
  Filled 2011-03-22: qty 0.5

## 2011-03-22 MED ORDER — CEPHALEXIN 500 MG PO CAPS: 500.0000 mg | ORAL_CAPSULE | Freq: Four times a day (QID) | ORAL | Status: DC

## 2011-03-22 NOTE — ED Provider Notes (Signed)
History     CSN: [id number]  Arrival date & time 03/22/11  0139   First MD Initiated Contact with Patient 03/22/11 0151      Chief Complaint  Patient presents with  . Animal Bite     HPI  Hx provided by pt.  Pt is a 31 yo Female who presents with complaints of rabbit bite to rt little finger around midnight today.  Pt reports recently getting rabbit from animal shelter.  Pt states it is unknown if rabbit ever had any immunizations.  She did sign a paper stating that she excepts animal without having any medical tx from the shelter.  She states that the rabbit was kicking at its cage and making noise as if it was distressed.  She went to pick the rabbit up when she was bitten.  Pt complains of small bite to rt little finger with some bleeding.  She denies numbness or weakness in finger. Patient reports having some pain in the finger is worse with movements. Patient denies any other aggravating or alleviating factors. Patient does not recall last tetanus shot.    Past Medical History  Diagnosis Date  . Migraine   . Kidney stone   . Kidney stones   . Gallstones   . Kidney stone     Past Surgical History  Procedure Date  . Abdominal hysterectomy   . Tonsillectomy   . Lithotripsy   . Cholecystectomy 03/11/2011    Procedure: LAPAROSCOPIC CHOLECYSTECTOMY WITH INTRAOPERATIVE CHOLANGIOGRAM;  Surgeon: Atilano Ina, MD;  Location: [facility] OR;  Service: General;  Laterality: N/A;    Family History  Problem Relation Age of Onset  . Diabetes Mother   . Diabetes Father   . Heart failure Father   . Hypertension Father   . Pulmonary fibrosis Father     History  Substance Use Topics  . Smoking status: Former Smoker -- 0.5 packs/day    Types: Cigarettes  . Smokeless tobacco: Not on file  . Alcohol Use: Yes     occaisional    OB History    Grav Para Term Preterm Abortions TAB SAB Ect Mult Living                  Review of Systems  All other systems reviewed and are  negative.    Allergies  Compazine and Reglan  Home Medications   Current Outpatient Rx  Name Route Sig Dispense Refill  . ELETRIPTAN HYDROBROMIDE 40 MG PO TABS Oral One tablet by mouth as needed for migraine headache.  If the headache improves and then returns, dose may be repeated after 2 hours have elapsed since first dose (do not exceed 80 mg per day). may repeat in 2 hours if necessary-for pain    . ESCITALOPRAM OXALATE 20 MG PO TABS Oral Take 20 mg by mouth daily.      Marland Kitchen GABAPENTIN 600 MG PO TABS Oral Take 600 mg by mouth 3 (three) times daily.      Marland Kitchen OMEPRAZOLE 20 MG PO CPDR Oral Take 20 mg by mouth daily.      Marland Kitchen VITAMIN D (CHOLECALCIFEROL) PO Oral Take 1 tablet by mouth daily.     Marland Kitchen BOTOX IJ Injection Inject 50 Units as directed every 3 (three) months. For migraines    . OXYCODONE-ACETAMINOPHEN 5-325 MG PO TABS Oral Take 1-2 tablets by mouth every 4 (four) hours as needed. For pain 30 tablet 0  . PROMETHAZINE HCL 25 MG PO TABS Oral Take  25 mg by mouth as needed. For nausea and vomiting       BP 125/67  Pulse 97  Temp(Src) 98.3 F (36.8 C) (Oral)  Resp 20  SpO2 95%  Physical Exam  Nursing note and vitals reviewed. Constitutional: She is oriented to person, place, and time. She appears well-developed and well-nourished. No distress.  HENT:  Head: Normocephalic.  Cardiovascular: Normal rate and regular rhythm.   Pulmonary/Chest: Effort normal and breath sounds normal.  Musculoskeletal:       Small puncture wound to dorsal rt 5th finger over DIP joint. No active bleeding. Full range of motion. Normal distal sensations and cap refill.  Neurological: She is alert and oriented to person, place, and time.  Skin: Skin is warm and dry.  Psychiatric: She has a normal mood and affect. Her behavior is normal.    ED Course  Procedures    1. Animal bite of finger       MDM  2:00AM Pt seen and evaluated. Pt in no acute distress.       Angus Seller, [place] 03/22/11  [phone]  Medical screening examination/treatment/procedure(s) were performed by non-physician practitioner and as supervising physician I was immediately available for consultation/collaboration.  Patient is able to keep her pet for observation as indicated pending animals records. Animal control documentation initiated. Holding rabies vaccinations at this time. Keflex provided.    Sunnie Nielsen, MD 03/22/11 [phone]

## 2011-03-22 NOTE — ED Notes (Signed)
PT. REPORTS HER PET RABBIT BIT HER AT RIGHT 5TH FINGER THIS EVENING , PRESENTS WITH PUNCTURE WOUND -BLEEDING CONTROLLED.

## 2011-03-22 NOTE — ED Notes (Signed)
Patient bit by her own rabbit, bleeding controlled.  Bite mark at top knuckle of right pinky.

## 2011-03-24 ENCOUNTER — Emergency Department (HOSPITAL_COMMUNITY): Payer: BC Managed Care – PPO

## 2011-03-24 ENCOUNTER — Other Ambulatory Visit: Payer: Self-pay

## 2011-03-24 ENCOUNTER — Emergency Department (HOSPITAL_COMMUNITY)
Admission: EM | Admit: 2011-03-24 | Discharge: 2011-03-24 | Disposition: A | Discharge status: Home / Self Care | Payer: BC Managed Care – PPO | Attending: Emergency Medicine | Admitting: Emergency Medicine

## 2011-03-24 ENCOUNTER — Encounter (HOSPITAL_COMMUNITY): Payer: Self-pay | Admitting: *Deleted

## 2011-03-24 DIAGNOSIS — R0781 Pleurodynia: Secondary | ICD-10-CM

## 2011-03-24 DIAGNOSIS — R071 Chest pain on breathing: Secondary | ICD-10-CM | POA: Insufficient documentation

## 2011-03-24 DIAGNOSIS — R0602 Shortness of breath: Secondary | ICD-10-CM | POA: Insufficient documentation

## 2011-03-24 DIAGNOSIS — Z79899 Other long term (current) drug therapy: Secondary | ICD-10-CM | POA: Insufficient documentation

## 2011-03-24 LAB — CBC
HCT: 39.8 % (ref 36.0–46.0)
Hemoglobin: 13.6 g/dL (ref 12.0–15.0)
MCH: 31.4 pg (ref 26.0–34.0)
MCHC: 34.2 g/dL (ref 30.0–36.0)
MCV: 91.9 fL (ref 78.0–100.0)
Platelets: 230 10*3/uL (ref 150–400)
RBC: 4.33 MIL/uL (ref 3.87–5.11)
RDW: 12.9 % (ref 11.5–15.5)
WBC: 11.1 10*3/uL — ABNORMAL HIGH (ref 4.0–10.5)

## 2011-03-24 LAB — COMPREHENSIVE METABOLIC PANEL
ALT: 62 U/L — ABNORMAL HIGH (ref 0–35)
AST: 30 U/L (ref 0–37)
Albumin: 3.6 g/dL (ref 3.5–5.2)
Alkaline Phosphatase: 80 U/L (ref 39–117)
BUN: 10 mg/dL (ref 6–23)
CO2: 24 mEq/L (ref 19–32)
Calcium: 9.6 mg/dL (ref 8.4–10.5)
Chloride: 105 mEq/L (ref 96–112)
Creatinine, Ser: 0.54 mg/dL (ref 0.50–1.10)
GFR calc Af Amer: >90 mL/min (ref >90)
GFR calc non Af Amer: >90 mL/min (ref >90)
Glucose, Bld: 78 mg/dL (ref 70–99)
Potassium: 4.4 mEq/L (ref 3.5–5.1)
Sodium: 139 mEq/L (ref 135–145)
Total Bilirubin: 0.2 mg/dL — ABNORMAL LOW (ref 0.3–1.2)
Total Protein: 7.1 g/dL (ref 6.0–8.3)

## 2011-03-24 LAB — DIFFERENTIAL
Basophils Absolute: 0.1 10*3/uL (ref 0.0–0.1)
Basophils Relative: 1 % (ref 0–1)
Eosinophils Absolute: 0.6 10*3/uL (ref 0.0–0.7)
Eosinophils Relative: 5 % (ref 0–5)
Lymphocytes Relative: 35 % (ref 12–46)
Lymphs Abs: 3.9 10*3/uL (ref 0.7–4.0)
Monocytes Absolute: 0.7 10*3/uL (ref 0.1–1.0)
Monocytes Relative: 7 % (ref 3–12)
Neutro Abs: 5.8 10*3/uL (ref 1.7–7.7)
Neutrophils Relative %: 52 % (ref 43–77)

## 2011-03-24 LAB — LIPASE, BLOOD: Lipase: 31 U/L (ref 11–59)

## 2011-03-24 MED ORDER — NAPROXEN 500 MG PO TABS: 500.0000 mg | ORAL_TABLET | Freq: Two times a day (BID) | ORAL | Status: DC

## 2011-03-24 MED ORDER — OXYCODONE-ACETAMINOPHEN 5-325 MG PO TABS: 1.0000 | ORAL_TABLET | Freq: Four times a day (QID) | ORAL | Status: AC | PRN

## 2011-03-24 MED ORDER — IOHEXOL 350 MG/ML SOLN
100.0000 mL | Freq: Once | INTRAVENOUS | Status: AC | PRN
  Administered 2011-03-24: 100 mL via INTRAVENOUS

## 2011-03-24 MED ORDER — IOHEXOL 300 MG/ML  SOLN: 100.0000 mL | Freq: Once | INTRAMUSCULAR | Status: DC | PRN

## 2011-03-24 MED ORDER — KETOROLAC TROMETHAMINE 30 MG/ML IJ SOLN
30.0000 mg | Freq: Once | INTRAMUSCULAR | Status: AC
  Administered 2011-03-24: 30 mg via INTRAVENOUS
  Filled 2011-03-24: qty 1

## 2011-03-24 MED ORDER — SODIUM CHLORIDE 0.9 % IV BOLUS (SEPSIS)
1000.0000 mL | Freq: Once | INTRAVENOUS | Status: AC
  Administered 2011-03-24: 1000 mL via INTRAVENOUS

## 2011-03-24 MED ORDER — HYDROMORPHONE HCL PF 1 MG/ML IJ SOLN
1.0000 mg | Freq: Once | INTRAMUSCULAR | Status: AC
  Administered 2011-03-24: 1 mg via INTRAVENOUS
  Filled 2011-03-24: qty 1

## 2011-03-24 NOTE — ED Notes (Signed)
C/o rt sided cp radiating to back with assoc sob. Pt had recent gallbladder removal. States pain began around noon; pt consulted surgeon and was told to wait for an hour to see if pain subsided and if pain continues pt was to present to ED.

## 2011-03-24 NOTE — ED Provider Notes (Signed)
History     CSN: [id number]  Arrival date & time 03/24/11  1700   First MD Initiated Contact with Patient 03/24/11 1745      Chief Complaint  Patient presents with  . Chest Pain    rt sided  . Shortness of Breath    (Consider location/radiation/quality/duration/timing/severity/associated sxs/prior treatment) HPI Comments: Patient had a cholecystectomy performed on 03/11/11. Developed right-sided chest pain with associated shortness of breath. Spoke with the surgeon who instructed her to seek evaluation  Patient is a 31 y.o. female presenting with chest pain. The history is provided by the patient. No language interpreter was used.  Chest Pain The chest pain began 3 - 5 hours ago. Chest pain occurs constantly. The chest pain is worsening. The pain is associated with breathing. The severity of the pain is severe. The quality of the pain is described as sharp. The pain does not radiate. Chest pain is worsened by deep breathing. Primary symptoms include shortness of breath. Pertinent negatives for primary symptoms include no fever, no fatigue, no cough, no palpitations, no abdominal pain, no nausea, no vomiting and no dizziness.  Pertinent negatives for associated symptoms include no numbness and no weakness.     Past Medical History  Diagnosis Date  . Migraine   . Kidney stone   . Kidney stones   . Gallstones   . Kidney stone     Past Surgical History  Procedure Date  . Abdominal hysterectomy   . Tonsillectomy   . Lithotripsy   . Cholecystectomy 03/11/2011    Procedure: LAPAROSCOPIC CHOLECYSTECTOMY WITH INTRAOPERATIVE CHOLANGIOGRAM;  Surgeon: Atilano Ina, MD;  Location: [facility] OR;  Service: General;  Laterality: N/A;  . Cholecystectomy, laparoscopic     Family History  Problem Relation Age of Onset  . Diabetes Mother   . Diabetes Father   . Heart failure Father   . Hypertension Father   . Pulmonary fibrosis Father     History  Substance Use Topics  . Smoking status:  Former Smoker -- 0.5 packs/day    Types: Cigarettes  . Smokeless tobacco: Not on file  . Alcohol Use: Yes     occaisional    OB History    Grav Para Term Preterm Abortions TAB SAB Ect Mult Living                  Review of Systems  Constitutional: Negative for fever, activity change, appetite change and fatigue.  HENT: Negative for congestion, sore throat, rhinorrhea, neck pain and neck stiffness.   Respiratory: Positive for shortness of breath. Negative for cough.   Cardiovascular: Positive for chest pain. Negative for palpitations.  Gastrointestinal: Negative for nausea, vomiting and abdominal pain.  Genitourinary: Negative for dysuria, urgency, frequency and flank pain.  Neurological: Negative for dizziness, weakness, light-headedness, numbness and headaches.  All other systems reviewed and are negative.    Allergies  Compazine and Reglan  Home Medications   Current Outpatient Rx  Name Route Sig Dispense Refill  . CEPHALEXIN 500 MG PO CAPS Oral Take 500 mg by mouth 4 (four) times daily.    Marland Kitchen ELETRIPTAN HYDROBROMIDE 40 MG PO TABS Oral One tablet by mouth as needed for migraine headache.  If the headache improves and then returns, dose may be repeated after 2 hours have elapsed since first dose (do not exceed 80 mg per day). may repeat in 2 hours if necessary-for pain    . ESCITALOPRAM OXALATE 20 MG PO TABS Oral Take 20 mg  by mouth daily.      Marland Kitchen GABAPENTIN 600 MG PO TABS Oral Take 600 mg by mouth 3 (three) times daily.      Marland Kitchen OMEPRAZOLE 20 MG PO CPDR Oral Take 20 mg by mouth daily.      Marland Kitchen PROMETHAZINE HCL 25 MG PO TABS Oral Take 25 mg by mouth as needed. For nausea and vomiting     . VITAMIN D (CHOLECALCIFEROL) PO Oral Take 1 tablet by mouth daily.     Marland Kitchen NAPROXEN 500 MG PO TABS Oral Take 1 tablet (500 mg total) by mouth 2 (two) times daily. 30 tablet 0  . BOTOX IJ Injection Inject 50 Units as directed every 3 (three) months. For migraines    . OXYCODONE-ACETAMINOPHEN 5-325  MG PO TABS Oral Take 1-2 tablets by mouth every 6 (six) hours as needed for pain. 15 tablet 0    BP 106/69  Pulse 72  Temp(Src) 97.8 F (36.6 C) (Oral)  Resp 14  SpO2 100%  Physical Exam  Nursing note and vitals reviewed. Constitutional: She is oriented to person, place, and time. She appears well-developed and well-nourished. No distress.  HENT:  Head: Normocephalic and atraumatic.  Mouth/Throat: Oropharynx is clear and moist.  Eyes: Conjunctivae and EOM are normal. Pupils are equal, round, and reactive to light.  Neck: Normal range of motion. Neck supple.  Cardiovascular: Normal rate, regular rhythm, normal heart sounds and intact distal pulses.  Exam reveals no gallop and no friction rub.   No murmur heard. Pulmonary/Chest: Effort normal and breath sounds normal. No respiratory distress. She exhibits tenderness (right sided).  Abdominal: Soft. Bowel sounds are normal. There is no tenderness.  Musculoskeletal: Normal range of motion. She exhibits no tenderness.  Neurological: She is alert and oriented to person, place, and time. No cranial nerve deficit.  Skin: Skin is warm and dry. No rash noted.    ED Course  Procedures (including critical care time)   Date: 03/24/2011  Rate: 77  Rhythm: normal sinus rhythm  QRS Axis: normal  Intervals: normal  ST/T Wave abnormalities: normal  Conduction Disutrbances:none  Narrative Interpretation:   Old EKG Reviewed: none available  Labs Reviewed  CBC - Abnormal; Notable for the following:    WBC 11.1 (*)    All other components within normal limits  COMPREHENSIVE METABOLIC PANEL - Abnormal; Notable for the following:    ALT 62 (*)    Total Bilirubin 0.2 (*)    All other components within normal limits  DIFFERENTIAL  LIPASE, BLOOD   Ct Angio Chest W/cm &/or Wo Cm  03/24/2011  *RADIOLOGY REPORT*  Clinical Data: Right side chest pain, shortness of breath  CT ANGIOGRAPHY CHEST  Technique:  Multidetector CT imaging of the chest  using the standard protocol during bolus administration of intravenous contrast. Multiplanar reconstructed images including MIPs were obtained and reviewed to evaluate the vascular anatomy.  Contrast:  100 ml Omnipaque 350 IV.  Comparison: None  Findings: Aorta normal caliber. Gallbladder surgically absent. Visualized portions of upper abdomen otherwise unremarkable. No thoracic adenopathy. Pulmonary arteries patent. No evidence of pulmonary embolism. Lungs clear. No pleural effusion or pneumothorax. No acute osseous findings.  IMPRESSION: Normal exam.  Original Report Authenticated By: Lollie Marrow, M.D.     1. Pleuritic chest pain       MDM  Patient is moderate to high-risk for pulmonary embolus given her recent surgery. CT angioma the chest is negative for acute pulmonary embolus. Pain was controlled in the  emergency department with ketorolac and the allotted. She will be discharged home with a short prescription of Percocet and Naprosyn. Instructed to followup with her primary care physician. I have no concern about a cardiac etiology given the location and character of her pain.        Dayton Bailiff, MD 03/24/11 [phone]

## 2011-03-26 ENCOUNTER — Encounter (INDEPENDENT_AMBULATORY_CARE_PROVIDER_SITE_OTHER): Payer: BC Managed Care – PPO | Admitting: General Surgery

## 2011-04-17 ENCOUNTER — Emergency Department (HOSPITAL_COMMUNITY)
Admission: EM | Admit: 2011-04-17 | Discharge: 2011-04-18 | Disposition: A | Discharge status: Home / Self Care | Payer: BC Managed Care – PPO | Attending: Emergency Medicine | Admitting: Emergency Medicine

## 2011-04-17 ENCOUNTER — Encounter (HOSPITAL_COMMUNITY): Payer: Self-pay | Admitting: Emergency Medicine

## 2011-04-17 ENCOUNTER — Emergency Department (HOSPITAL_COMMUNITY): Payer: BC Managed Care – PPO

## 2011-04-17 DIAGNOSIS — N200 Calculus of kidney: Secondary | ICD-10-CM | POA: Insufficient documentation

## 2011-04-17 DIAGNOSIS — R209 Unspecified disturbances of skin sensation: Secondary | ICD-10-CM | POA: Insufficient documentation

## 2011-04-17 DIAGNOSIS — R231 Pallor: Secondary | ICD-10-CM | POA: Insufficient documentation

## 2011-04-17 DIAGNOSIS — R110 Nausea: Secondary | ICD-10-CM | POA: Insufficient documentation

## 2011-04-17 DIAGNOSIS — R109 Unspecified abdominal pain: Secondary | ICD-10-CM

## 2011-04-17 DIAGNOSIS — Z79899 Other long term (current) drug therapy: Secondary | ICD-10-CM | POA: Insufficient documentation

## 2011-04-17 LAB — POCT I-STAT, CHEM 8
BUN: 11 mg/dL (ref 6–23)
Calcium, Ion: 1.22 mmol/L (ref 1.12–1.32)
Chloride: 108 mEq/L (ref 96–112)
Creatinine, Ser: 0.7 mg/dL (ref 0.50–1.10)
Glucose, Bld: 81 mg/dL (ref 70–99)
HCT: 41 % (ref 36.0–46.0)
Hemoglobin: 13.9 g/dL (ref 12.0–15.0)
Potassium: 3.9 mEq/L (ref 3.5–5.1)
Sodium: 139 mEq/L (ref 135–145)
TCO2: 24 mmol/L (ref 0–100)

## 2011-04-17 LAB — CBC
HCT: 40.1 % (ref 36.0–46.0)
Hemoglobin: 13.8 g/dL (ref 12.0–15.0)
MCH: 31.4 pg (ref 26.0–34.0)
MCHC: 34.4 g/dL (ref 30.0–36.0)
MCV: 91.1 fL (ref 78.0–100.0)
Platelets: 233 10*3/uL (ref 150–400)
RBC: 4.4 MIL/uL (ref 3.87–5.11)
RDW: 13.1 % (ref 11.5–15.5)
WBC: 15 10*3/uL — ABNORMAL HIGH (ref 4.0–10.5)

## 2011-04-17 LAB — DIFFERENTIAL
Basophils Absolute: 0.1 10*3/uL (ref 0.0–0.1)
Basophils Relative: 1 % (ref 0–1)
Eosinophils Absolute: 0.3 10*3/uL (ref 0.0–0.7)
Eosinophils Relative: 2 % (ref 0–5)
Lymphocytes Relative: 26 % (ref 12–46)
Lymphs Abs: 3.9 10*3/uL (ref 0.7–4.0)
Monocytes Absolute: 1 10*3/uL (ref 0.1–1.0)
Monocytes Relative: 6 % (ref 3–12)
Neutro Abs: 9.8 10*3/uL — ABNORMAL HIGH (ref 1.7–7.7)
Neutrophils Relative %: 65 % (ref 43–77)

## 2011-04-17 LAB — URINALYSIS, ROUTINE W REFLEX MICROSCOPIC
Bilirubin Urine: NEGATIVE
Glucose, UA: NEGATIVE mg/dL
Ketones, ur: NEGATIVE mg/dL
Leukocytes, UA: NEGATIVE
Nitrite: NEGATIVE
Protein, ur: NEGATIVE mg/dL
Specific Gravity, Urine: 1.008 (ref 1.005–1.030)
Urobilinogen, UA: 0.2 mg/dL (ref 0.0–1.0)
pH: 7 (ref 5.0–8.0)

## 2011-04-17 LAB — URINE MICROSCOPIC-ADD ON

## 2011-04-17 LAB — POCT PREGNANCY, URINE: Preg Test, Ur: NEGATIVE

## 2011-04-17 MED ORDER — SODIUM CHLORIDE 0.9 % IV SOLN: Freq: Once | INTRAVENOUS | Status: DC

## 2011-04-17 MED ORDER — HYDROMORPHONE HCL PF 1 MG/ML IJ SOLN
0.5000 mg | Freq: Once | INTRAMUSCULAR | Status: AC
  Administered 2011-04-17: 0.5 mg via INTRAVENOUS
  Filled 2011-04-17: qty 1

## 2011-04-17 MED ORDER — OXYCODONE-ACETAMINOPHEN 5-325 MG PO TABS
2.0000 | ORAL_TABLET | Freq: Once | ORAL | Status: AC
  Administered 2011-04-17: 2 via ORAL
  Filled 2011-04-17: qty 2

## 2011-04-17 MED ORDER — KETOROLAC TROMETHAMINE 30 MG/ML IJ SOLN
30.0000 mg | Freq: Once | INTRAMUSCULAR | Status: AC
  Administered 2011-04-17: 30 mg via INTRAVENOUS
  Filled 2011-04-17: qty 1

## 2011-04-17 MED ORDER — ONDANSETRON HCL 4 MG/2ML IJ SOLN
4.0000 mg | Freq: Once | INTRAMUSCULAR | Status: AC
  Administered 2011-04-17: 4 mg via INTRAVENOUS
  Filled 2011-04-17: qty 2

## 2011-04-17 NOTE — ED Notes (Signed)
MD at bedside. 

## 2011-04-17 NOTE — ED Notes (Signed)
Pt here c/o left flank pain and hematuria starting today; pt sts hx of kidney stone

## 2011-04-17 NOTE — ED Notes (Signed)
Patient states that she would rather go home because we were unable to control her pain.  Patient has been up walking around the room.

## 2011-04-17 NOTE — ED Provider Notes (Signed)
History     CSN: [id number]  Arrival date & time 04/17/11  1806   First MD Initiated Contact with Patient 04/17/11 2114      Chief Complaint  Patient presents with  . Flank Pain    (Consider location/radiation/quality/duration/timing/severity/associated sxs/prior treatment) HPI Comments: Patient has a history of kidney stones has a known stone in the left kidney.  Today, he started having pain, hematuria, nausea, but no vomiting  Patient is a 31 y.o. female presenting with flank pain. The history is provided by the patient.  Flank Pain This is a recurrent problem. The current episode started today. Associated symptoms include nausea and numbness. Pertinent negatives include no fever or vomiting.    Past Medical History  Diagnosis Date  . Migraine   . Kidney stone   . Kidney stones   . Gallstones   . Kidney stone     Past Surgical History  Procedure Date  . Abdominal hysterectomy   . Tonsillectomy   . Lithotripsy   . Cholecystectomy 03/11/2011    Procedure: LAPAROSCOPIC CHOLECYSTECTOMY WITH INTRAOPERATIVE CHOLANGIOGRAM;  Surgeon: Atilano Ina, MD;  Location: [facility] OR;  Service: General;  Laterality: N/A;  . Cholecystectomy, laparoscopic     Family History  Problem Relation Age of Onset  . Diabetes Mother   . Diabetes Father   . Heart failure Father   . Hypertension Father   . Pulmonary fibrosis Father     History  Substance Use Topics  . Smoking status: Former Smoker -- 0.5 packs/day    Types: Cigarettes  . Smokeless tobacco: Not on file  . Alcohol Use: Yes     occaisional    OB History    Grav Para Term Preterm Abortions TAB SAB Ect Mult Living                  Review of Systems  Constitutional: Negative for fever.  Gastrointestinal: Positive for nausea. Negative for vomiting.  Genitourinary: Positive for hematuria and flank pain. Negative for dysuria.  Skin: Positive for pallor.  Neurological: Positive for numbness.    Allergies  Compazine and  Reglan  Home Medications   Current Outpatient Rx  Name Route Sig Dispense Refill  . ELETRIPTAN HYDROBROMIDE 40 MG PO TABS Oral One tablet by mouth as needed for migraine headache.  If the headache improves and then returns, dose may be repeated after 2 hours have elapsed since first dose (do not exceed 80 mg per day). may repeat in 2 hours if necessary-for pain    . ESCITALOPRAM OXALATE 20 MG PO TABS Oral Take 20 mg by mouth daily.      Marland Kitchen GABAPENTIN 600 MG PO TABS Oral Take 600 mg by mouth 3 (three) times daily.      Marland Kitchen OMEPRAZOLE 20 MG PO CPDR Oral Take 20 mg by mouth daily.      Marland Kitchen BOTOX IJ Injection Inject 50 Units as directed every 3 (three) months. For migraines    . PROMETHAZINE HCL 25 MG PO TABS Oral Take 25 mg by mouth as needed. For nausea and vomiting     . VITAMIN D (CHOLECALCIFEROL) PO Oral Take 1 tablet by mouth daily.       BP 106/67  Pulse 85  Temp(Src) 97.1 F (36.2 C) (Oral)  Resp 22  SpO2 96%  Physical Exam  Constitutional: She is oriented to person, place, and time. She appears well-developed and well-nourished.  HENT:  Head: Normocephalic.  Eyes: Pupils are equal, round, and  reactive to light.  Neck: Normal range of motion.  Cardiovascular: Normal rate.   Pulmonary/Chest: Effort normal.  Abdominal: She exhibits no distension. There is no tenderness.  Musculoskeletal: Normal range of motion.  Neurological: She is alert and oriented to person, place, and time.  Skin: Skin is warm and dry.  Psychiatric: She has a normal mood and affect.    ED Course  Procedures (including critical care time)  Labs Reviewed  URINALYSIS, ROUTINE W REFLEX MICROSCOPIC - Abnormal; Notable for the following:    Hgb urine dipstick LARGE (*)    All other components within normal limits  CBC - Abnormal; Notable for the following:    WBC 15.0 (*)    All other components within normal limits  DIFFERENTIAL - Abnormal; Notable for the following:    Neutro Abs 9.8 (*)    All other  components within normal limits  POCT PREGNANCY, URINE  URINE MICROSCOPIC-ADD ON  POCT I-STAT, CHEM 8   Dg Abd 1 View  04/17/2011  *RADIOLOGY REPORT*  Clinical Data: Left lower abdominal pain.  Nausea.  Hematuria. Gallbladder removal 1 month ago.  ABDOMEN - 1 VIEW  Comparison: Multiple exams, including 03/07/2011  Findings: A 3 mm calcification projects over the midpole of the left renal shadow midportion of the left renal shadow, similar in location to the prior exam.  Smaller punctate right mid kidney and left kidney lower pole calculi on the prior CT are not readily visible on today's conventional radiographs.  No definite calculus is visualized projecting over the expected course of the left ureter, although CT may more offer improve sensitivity.  Unremarkable bowel gas pattern.  IMPRESSION:  1.  3 mm left mid kidney calculus, similar in position to the prior exam.  The minuscule punctate left kidney lower pole and right mid kidney calculi from the prior CT scan are not readily visible.  Original Report Authenticated By: Dellia Cloud, M.D.     No diagnosis found.  Does affect the patient has had multiple CT scans recently.  He, does, we'll do a KUB to look for hydronephrosis and with his stamina.  His sternum is stable in the left kidney itself.  No hydronephrosis noted on the x-ray.  She does have hematuria on her urine without signs of infection.  She has had a hysterectomy so this is not vaginal bleeding.  Will refer patient back to urology for further evaluation  MDM  Kidney stone        Arman Filter, NP 04/18/11 0003

## 2011-04-18 MED ORDER — OXYCODONE-ACETAMINOPHEN 5-325 MG PO TABS: 1.0000 | ORAL_TABLET | Freq: Four times a day (QID) | ORAL | Status: AC | PRN

## 2011-04-18 NOTE — ED Provider Notes (Signed)
Medical screening examination/treatment/procedure(s) were performed by non-physician practitioner and as supervising physician I was immediately available for consultation/collaboration.  Margurette Brener L Devoiry Corriher, MD 04/18/11 0116 

## 2011-04-18 NOTE — Discharge Instructions (Signed)
As discussed.  It is important that you call Dr. Laverle Patter at [facility] urology for further evaluation

## 2011-04-21 ENCOUNTER — Emergency Department: Payer: Self-pay | Admitting: Emergency Medicine

## 2011-04-30 ENCOUNTER — Emergency Department: Payer: Self-pay | Admitting: *Deleted

## 2011-07-23 ENCOUNTER — Emergency Department: Payer: Self-pay | Admitting: Emergency Medicine

## 2011-08-01 ENCOUNTER — Ambulatory Visit: Payer: Self-pay | Admitting: Family Medicine

## 2011-08-04 ENCOUNTER — Emergency Department (HOSPITAL_COMMUNITY): Payer: BC Managed Care – PPO

## 2011-08-04 ENCOUNTER — Encounter (HOSPITAL_COMMUNITY): Payer: Self-pay | Admitting: *Deleted

## 2011-08-04 ENCOUNTER — Emergency Department (HOSPITAL_COMMUNITY)
Admission: EM | Admit: 2011-08-04 | Discharge: 2011-08-04 | Disposition: A | Discharge status: Home / Self Care | Payer: BC Managed Care – PPO | Attending: Emergency Medicine | Admitting: Emergency Medicine

## 2011-08-04 DIAGNOSIS — Z79899 Other long term (current) drug therapy: Secondary | ICD-10-CM | POA: Insufficient documentation

## 2011-08-04 DIAGNOSIS — R319 Hematuria, unspecified: Secondary | ICD-10-CM

## 2011-08-04 DIAGNOSIS — R1032 Left lower quadrant pain: Secondary | ICD-10-CM | POA: Insufficient documentation

## 2011-08-04 DIAGNOSIS — R109 Unspecified abdominal pain: Secondary | ICD-10-CM

## 2011-08-04 DIAGNOSIS — N200 Calculus of kidney: Secondary | ICD-10-CM | POA: Insufficient documentation

## 2011-08-04 LAB — URINALYSIS, ROUTINE W REFLEX MICROSCOPIC
Bilirubin Urine: NEGATIVE
Glucose, UA: NEGATIVE mg/dL
Ketones, ur: 15 mg/dL — AB
Nitrite: NEGATIVE
Protein, ur: NEGATIVE mg/dL
Specific Gravity, Urine: 1.017 (ref 1.005–1.030)
Urobilinogen, UA: 0.2 mg/dL (ref 0.0–1.0)
pH: 5.5 (ref 5.0–8.0)

## 2011-08-04 LAB — COMPREHENSIVE METABOLIC PANEL
ALT: 16 U/L (ref 0–35)
AST: 19 U/L (ref 0–37)
Albumin: 3.3 g/dL — ABNORMAL LOW (ref 3.5–5.2)
Alkaline Phosphatase: 76 U/L (ref 39–117)
BUN: 7 mg/dL (ref 6–23)
CO2: 24 mEq/L (ref 19–32)
Calcium: 8.9 mg/dL (ref 8.4–10.5)
Chloride: 106 mEq/L (ref 96–112)
Creatinine, Ser: 0.53 mg/dL (ref 0.50–1.10)
GFR calc Af Amer: >90 mL/min (ref >90)
GFR calc non Af Amer: >90 mL/min (ref >90)
Glucose, Bld: 89 mg/dL (ref 70–99)
Potassium: 3.9 mEq/L (ref 3.5–5.1)
Sodium: 139 mEq/L (ref 135–145)
Total Bilirubin: 0.2 mg/dL — ABNORMAL LOW (ref 0.3–1.2)
Total Protein: 6.3 g/dL (ref 6.0–8.3)

## 2011-08-04 LAB — DIFFERENTIAL
Basophils Absolute: 0.1 10*3/uL (ref 0.0–0.1)
Basophils Relative: 0 % (ref 0–1)
Eosinophils Absolute: 0.2 10*3/uL (ref 0.0–0.7)
Eosinophils Relative: 2 % (ref 0–5)
Lymphocytes Relative: 25 % (ref 12–46)
Lymphs Abs: 3.3 10*3/uL (ref 0.7–4.0)
Monocytes Absolute: 0.8 10*3/uL (ref 0.1–1.0)
Monocytes Relative: 6 % (ref 3–12)
Neutro Abs: 8.8 10*3/uL — ABNORMAL HIGH (ref 1.7–7.7)
Neutrophils Relative %: 67 % (ref 43–77)

## 2011-08-04 LAB — CBC
HCT: 38.4 % (ref 36.0–46.0)
Hemoglobin: 12.8 g/dL (ref 12.0–15.0)
MCH: 30.9 pg (ref 26.0–34.0)
MCHC: 33.3 g/dL (ref 30.0–36.0)
MCV: 92.8 fL (ref 78.0–100.0)
Platelets: 197 10*3/uL (ref 150–400)
RBC: 4.14 MIL/uL (ref 3.87–5.11)
RDW: 13.4 % (ref 11.5–15.5)
WBC: 13.1 10*3/uL — ABNORMAL HIGH (ref 4.0–10.5)

## 2011-08-04 LAB — URINE MICROSCOPIC-ADD ON

## 2011-08-04 LAB — POCT PREGNANCY, URINE: Preg Test, Ur: NEGATIVE

## 2011-08-04 MED ORDER — FENTANYL CITRATE 0.05 MG/ML IJ SOLN
50.0000 ug | Freq: Once | INTRAMUSCULAR | Status: AC
  Administered 2011-08-04: 50 ug via INTRAVENOUS
  Filled 2011-08-04: qty 2

## 2011-08-04 MED ORDER — HYDROMORPHONE HCL PF 1 MG/ML IJ SOLN
1.0000 mg | Freq: Once | INTRAMUSCULAR | Status: AC
  Administered 2011-08-04: 1 mg via INTRAVENOUS
  Filled 2011-08-04: qty 1

## 2011-08-04 MED ORDER — SODIUM CHLORIDE 0.9 % IV BOLUS (SEPSIS)
1000.0000 mL | Freq: Once | INTRAVENOUS | Status: AC
  Administered 2011-08-04: 1000 mL via INTRAVENOUS

## 2011-08-04 MED ORDER — KETOROLAC TROMETHAMINE 30 MG/ML IJ SOLN
30.0000 mg | Freq: Once | INTRAMUSCULAR | Status: AC
  Administered 2011-08-04: 30 mg via INTRAVENOUS
  Filled 2011-08-04: qty 1

## 2011-08-04 MED ORDER — OXYCODONE-ACETAMINOPHEN 5-325 MG PO TABS: 1.0000 | ORAL_TABLET | ORAL | Status: AC | PRN

## 2011-08-04 MED ORDER — ONDANSETRON HCL 4 MG/2ML IJ SOLN
4.0000 mg | Freq: Once | INTRAMUSCULAR | Status: AC
  Administered 2011-08-04: 4 mg via INTRAVENOUS
  Filled 2011-08-04: qty 2

## 2011-08-04 MED ORDER — IOHEXOL 300 MG/ML  SOLN
100.0000 mL | Freq: Once | INTRAMUSCULAR | Status: AC | PRN
  Administered 2011-08-04: 100 mL via INTRAVENOUS

## 2011-08-04 MED ORDER — IOHEXOL 300 MG/ML  SOLN
20.0000 mL | INTRAMUSCULAR | Status: AC
  Administered 2011-08-04: 20 mL via ORAL

## 2011-08-04 MED ORDER — SODIUM CHLORIDE 0.9 % IV SOLN: Freq: Once | INTRAVENOUS | Status: DC

## 2011-08-04 NOTE — Discharge Instructions (Signed)
UA CT scan and ultrasound both show that there is a stone in your left kidney, but it is not in a position that should be causing problems. No other stones have been identified. You may take Percocet as needed for pain. You need to followup with the urologist to evaluate why you're having such severe flank pain and why you're having blood in your urine.  Acetaminophen; Oxycodone tablets What is this medicine? ACETAMINOPHEN; OXYCODONE (a set a MEE noe fen; ox i KOE done) is a pain reliever. It is used to treat mild to moderate pain. This medicine may be used for other purposes; ask your health care provider or pharmacist if you have questions. What should I tell my health care provider before I take this medicine? They need to know if you have any of these conditions: -brain tumor -Crohn's disease, inflammatory bowel disease, or ulcerative colitis -drink more than 3 alcohol containing drinks per day -drug abuse or addiction -head injury -heart or circulation problems -kidney disease or problems going to the bathroom -liver disease -lung disease, asthma, or breathing problems -an unusual or allergic reaction to acetaminophen, oxycodone, other opioid analgesics, other medicines, foods, dyes, or preservatives -pregnant or trying to get pregnant -breast-feeding How should I use this medicine? Take this medicine by mouth with a full glass of water. Follow the directions on the prescription label. Take your medicine at regular intervals. Do not take your medicine more often than directed. Talk to your pediatrician regarding the use of this medicine in children. Special care may be needed. Patients over 23 years old may have a stronger reaction and need a smaller dose. Overdosage: If you think you have taken too much of this medicine contact a poison control center or emergency room at once. NOTE: This medicine is only for you. Do not share this medicine with others. What if I miss a dose? If you  miss a dose, take it as soon as you can. If it is almost time for your next dose, take only that dose. Do not take double or extra doses. What may interact with this medicine? -alcohol or medicines that contain alcohol -antihistamines -barbiturates like amobarbital, butalbital, butabarbital, methohexital, pentobarbital, phenobarbital, thiopental, and secobarbital -benztropine -drugs for bladder problems like solifenacin, trospium, oxybutynin, tolterodine, hyoscyamine, and methscopolamine -drugs for breathing problems like ipratropium and tiotropium -drugs for certain stomach or intestine problems like propantheline, homatropine methylbromide, glycopyrrolate, atropine, belladonna, and dicyclomine -general anesthetics like etomidate, ketamine, nitrous oxide, propofol, desflurane, enflurane, halothane, isoflurane, and sevoflurane -medicines for depression, anxiety, or psychotic disturbances -medicines for pain like codeine, morphine, pentazocine, buprenorphine, butorphanol, nalbuphine, tramadol, and propoxyphene -medicines for sleep -muscle relaxants -naltrexone -phenothiazines like perphenazine, thioridazine, chlorpromazine, mesoridazine, fluphenazine, prochlorperazine, promazine, and trifluoperazine -scopolamine -trihexyphenidyl This list may not describe all possible interactions. Give your health care provider a list of all the medicines, herbs, non-prescription drugs, or dietary supplements you use. Also tell them if you smoke, drink alcohol, or use illegal drugs. Some items may interact with your medicine. What should I watch for while using this medicine? Tell your doctor or health care professional if your pain does not go away, if it gets worse, or if you have new or a different type of pain. You may develop tolerance to the medicine. Tolerance means that you will need a higher dose of the medication for pain relief. Tolerance is normal and is expected if you take this medicine for a long  time. Do not suddenly stop taking your medicine  because you may develop a severe reaction. Your body becomes used to the medicine. This does NOT mean you are addicted. Addiction is a behavior related to getting and using a drug for a nonmedical reason. If you have pain, you have a medical reason to take pain medicine. Your doctor will tell you how much medicine to take. If your doctor wants you to stop the medicine, the dose will be slowly lowered over time to avoid any side effects. You may get drowsy or dizzy. Do not drive, use machinery, or do anything that needs mental alertness until you know how this medicine affects you. Do not stand or sit up quickly, especially if you are an older patient. This reduces the risk of dizzy or fainting spells. Alcohol may interfere with the effect of this medicine. Avoid alcoholic drinks. The medicine will cause constipation. Try to have a bowel movement at least every 2 to 3 days. If you do not have a bowel movement for 3 days, call your doctor or health care professional. Do not take Tylenol (acetaminophen) or medicines that have acetaminophen with this medicine. Too much acetaminophen can be very dangerous. Many nonprescription medicines contain acetaminophen. Always read the labels carefully to avoid taking more acetaminophen. What side effects may I notice from receiving this medicine? Side effects that you should report to your doctor or health care professional as soon as possible: -allergic reactions like skin rash, itching or hives, swelling of the face, lips, or tongue -breathing difficulties, wheezing -confusion -light headedness or fainting spells -severe stomach pain -yellowing of the skin or the whites of the eyes Side effects that usually do not require medical attention (report to your doctor or health care professional if they continue or are bothersome): -dizziness -drowsiness -nausea -vomiting This list may not describe all possible side  effects. Call your doctor for medical advice about side effects. You may report side effects to FDA at 1-800-FDA-1088. Where should I keep my medicine? Keep out of the reach of children. This medicine can be abused. Keep your medicine in a safe place to protect it from theft. Do not share this medicine with anyone. Selling or giving away this medicine is dangerous and against the law. Store at room temperature between 20 and 25 degrees C (68 and 77 degrees F). Keep container tightly closed. Protect from light. Flush any unused medicines down the toilet. Do not use the medicine after the expiration date. NOTE: This sheet is a summary. It may not cover all possible information. If you have questions about this medicine, talk to your doctor, pharmacist, or health care provider.  2012, [employer]. (01/11/2008 10:01:21 AM)

## 2011-08-04 NOTE — ED Notes (Signed)
Patient transported to Ultrasound 

## 2011-08-04 NOTE — ED Notes (Signed)
PT reports pain medicine helped briefly but now she is in pain again; MD aware.

## 2011-08-04 NOTE — ED Notes (Signed)
Patient transported to X-ray 

## 2011-08-04 NOTE — ED Notes (Signed)
MD at bedside. 

## 2011-08-04 NOTE — ED Notes (Signed)
Pt c/o LLQ abdominal pain, pt crying, holding her left side. EDP made aware.

## 2011-08-04 NOTE — ED Notes (Signed)
Pt with hx of lithotripsy and stent placement r/t kidney stones to ED c/o lower abd pain and blood in urine.  She has some "small stones or pieces" in a urine cup.  PT appears in great pain.  Took phenergan at 2 am to stave off nausea.

## 2011-08-04 NOTE — ED Notes (Signed)
Patient transported to CT 

## 2011-08-04 NOTE — ED Notes (Signed)
Pt finished contrast, CT notified

## 2011-08-04 NOTE — ED Provider Notes (Signed)
History     CSN: [id number]  Arrival date & time 08/04/11  0520   First MD Initiated Contact with Patient 08/04/11 [phone]      Chief Complaint  Patient presents with  . Nephrolithiasis    (Consider location/radiation/quality/duration/timing/severity/associated sxs/prior treatment) The history is provided by the patient.   31 year old female with a history of kidney stones had onset yesterday of intermittent left lower quadrant pain which became constant during the night. Severe and crampy. She is passed some small fragments of stone. She has a known calculus in her left kidney. She had nausea for which he took Phenergan with relief of nausea. She tried taking ibuprofen and Flomax for pain which have not been effective. Pain is severe and she rates it at 10 over 10. She denies fever, chills, sweats. She has had hematuria.  Past Medical History  Diagnosis Date  . Migraine   . Kidney stone   . Kidney stones   . Gallstones   . Kidney stone     Past Surgical History  Procedure Date  . Abdominal hysterectomy   . Tonsillectomy   . Lithotripsy   . Cholecystectomy 03/11/2011    Procedure: LAPAROSCOPIC CHOLECYSTECTOMY WITH INTRAOPERATIVE CHOLANGIOGRAM;  Surgeon: Atilano Ina, MD;  Location: [facility] OR;  Service: General;  Laterality: N/A;  . Cholecystectomy, laparoscopic     Family History  Problem Relation Age of Onset  . Diabetes Mother   . Diabetes Father   . Heart failure Father   . Hypertension Father   . Pulmonary fibrosis Father     History  Substance Use Topics  . Smoking status: Former Smoker -- 0.5 packs/day    Types: Cigarettes  . Smokeless tobacco: Not on file  . Alcohol Use: Yes     occaisional    OB History    Grav Para Term Preterm Abortions TAB SAB Ect Mult Living                  Review of Systems  All other systems reviewed and are negative.    Allergies  Compazine and Metoclopramide hcl  Home Medications   Current Outpatient Rx  Name Route Sig  Dispense Refill  . ELETRIPTAN HYDROBROMIDE 40 MG PO TABS Oral One tablet by mouth as needed for migraine headache.  If the headache improves and then returns, dose may be repeated after 2 hours have elapsed since first dose (do not exceed 80 mg per day). may repeat in 2 hours if necessary-for pain    . ESCITALOPRAM OXALATE 20 MG PO TABS Oral Take 40 mg by mouth daily.    Marland Kitchen GABAPENTIN 600 MG PO TABS Oral Take 600 mg by mouth 3 (three) times daily.      Marland Kitchen OMEPRAZOLE 20 MG PO CPDR Oral Take 20 mg by mouth daily.      Marland Kitchen PROMETHAZINE HCL 25 MG PO TABS Oral Take 25 mg by mouth as needed. For nausea and vomiting     . VITAMIN D (CHOLECALCIFEROL) PO Oral Take 1 tablet by mouth daily.       BP 126/58  Pulse 80  Temp(Src) 97.5 F (36.4 C) (Oral)  Resp 16  SpO2 100%  Physical Exam  Nursing note and vitals reviewed.  31 year old female who is resting comfortably and in no acute distress. Vital signs are normal for tachycardia with heart rate 121. Oxygen saturation is 100% which is normal. Head is normocephalic and atraumatic. PERRLA, EOMI. Neck is nontender and supple. Back is nontender  there's no CVA tenderness. Lungs are clear without rales, wheezes, or rhonchi. Heart has regular rate rhythm without murmur. Abdomen is soft, flat, with mild left lower quadrant tenderness. Is no rebound or guarding. Peristalsis is decreased. Extremities have no cyanosis or edema, full range of motion is present. Skin is warm and dry without rash. Neurologic: Mental status is normal, cranial nerves are intact, there are no motor or sensory deficits.  ED Course  Procedures (including critical care time)  Results for orders placed during the hospital encounter of 08/04/11  URINALYSIS, ROUTINE W REFLEX MICROSCOPIC      Component Value Range   Color, Urine RED (*) YELLOW    APPearance CLOUDY (*) CLEAR    Specific Gravity, Urine 1.017  1.005 - 1.030    pH 5.5  5.0 - 8.0    Glucose, UA NEGATIVE  NEGATIVE (mg/dL)   Hgb  urine dipstick LARGE (*) NEGATIVE    Bilirubin Urine NEGATIVE  NEGATIVE    Ketones, ur 15 (*) NEGATIVE (mg/dL)   Protein, ur NEGATIVE  NEGATIVE (mg/dL)   Urobilinogen, UA 0.2  0.0 - 1.0 (mg/dL)   Nitrite NEGATIVE  NEGATIVE    Leukocytes, UA SMALL (*) NEGATIVE   POCT PREGNANCY, URINE      Component Value Range   Preg Test, Ur NEGATIVE  NEGATIVE   URINE MICROSCOPIC-ADD ON      Component Value Range   Squamous Epithelial / LPF MANY (*) RARE    WBC, UA 3-6  <3 (WBC/hpf)   RBC / HPF TOO NUMEROUS TO COUNT  <3 (RBC/hpf)   Bacteria, UA FEW (*) RARE   CBC      Component Value Range   WBC 13.1 (*) 4.0 - 10.5 (K/uL)   RBC 4.14  3.87 - 5.11 (MIL/uL)   Hemoglobin 12.8  12.0 - 15.0 (g/dL)   HCT 78.2  95.6 - 21.3 (%)   MCV 92.8  78.0 - 100.0 (fL)   MCH 30.9  26.0 - 34.0 (pg)   MCHC 33.3  30.0 - 36.0 (g/dL)   RDW 08.6  57.8 - 46.9 (%)   Platelets 197  150 - 400 (K/uL)  DIFFERENTIAL      Component Value Range   Neutrophils Relative 67  43 - 77 (%)   Neutro Abs 8.8 (*) 1.7 - 7.7 (K/uL)   Lymphocytes Relative 25  12 - 46 (%)   Lymphs Abs 3.3  0.7 - 4.0 (K/uL)   Monocytes Relative 6  3 - 12 (%)   Monocytes Absolute 0.8  0.1 - 1.0 (K/uL)   Eosinophils Relative 2  0 - 5 (%)   Eosinophils Absolute 0.2  0.0 - 0.7 (K/uL)   Basophils Relative 0  0 - 1 (%)   Basophils Absolute 0.1  0.0 - 0.1 (K/uL)  COMPREHENSIVE METABOLIC PANEL      Component Value Range   Sodium 139  135 - 145 (mEq/L)   Potassium 3.9  3.5 - 5.1 (mEq/L)   Chloride 106  96 - 112 (mEq/L)   CO2 24  19 - 32 (mEq/L)   Glucose, Bld 89  70 - 99 (mg/dL)   BUN 7  6 - 23 (mg/dL)   Creatinine, Ser 6.29  0.50 - 1.10 (mg/dL)   Calcium 8.9  8.4 - 52.8 (mg/dL)   Total Protein 6.3  6.0 - 8.3 (g/dL)   Albumin 3.3 (*) 3.5 - 5.2 (g/dL)   AST 19  0 - 37 (U/L)   ALT 16  0 -  35 (U/L)   Alkaline Phosphatase 76  39 - 117 (U/L)   Total Bilirubin 0.2 (*) 0.3 - 1.2 (mg/dL)   GFR calc non Af Amer >90  >90 (mL/min)   GFR calc Af Amer >90  >90  (mL/min)   Dg Abd 1 View  08/04/2011  *RADIOLOGY REPORT*  Clinical Data: Nephrolithiasis.  ABDOMEN - 1 VIEW  Comparison: Abdominal radiograph 04/17/2011 and CT stone study 03/04/2011.  Findings: Stable 3 mm calcification projecting over the left renal shadow.  No stone is seen over the right renal shadow or the course of either ureter.  There are no pelvic calcifications. Nonobstructive bowel gas pattern.  Bones unremarkable.  IMPRESSION:  1.  3 mm left renal calculus appears stable compared to prior abdominal radiographs and abdominal CT. 2.  No evidence of ureteral or bladder stone.  Original Report Authenticated By: Britta Mccreedy, M.D.   Ct Abdomen Pelvis W Contrast  08/04/2011  *RADIOLOGY REPORT*  Clinical Data: Left abdominal pain and blood in the urine.  CT ABDOMEN AND PELVIS WITH CONTRAST  Technique:  Multidetector CT imaging of the abdomen and pelvis was performed following the standard protocol during bolus administration of intravenous contrast.  Contrast: OMNIPAQUE IOHEXOL 300 MG/ML  SOLN  Comparison: Abdominal CT 03/04/2011  Findings: Lung bases are clear.  No evidence for free air.  Stable calcification in the left hepatic lobe.  Otherwise, normal appearance of the liver, portal venous system, spleen, pancreas and adrenal glands.  A small cortical cyst in the right kidney and mild fullness of the right ureter.  No evidence for a right kidney stone or right ureter stone. There is a 7 mm stone in the mid left kidney.  Mild fullness of the left ureter but no evidence for a ureter stone.  The uterus has been removed.  Adnexa tissue is slightly prominent but probably within normal limits for age and there could be follicular cysts present.  No definite stone within the urinary bladder.  No evidence for a free fluid or lymphadenopathy.  There is a moderate amount of stool throughout the colon.  IMPRESSION: 7 mm left kidney stone.  No significant hydronephrosis.  No evidence for ureter stones.  The  adnexa tissue is prominent but no gross abnormality.  Original Report Authenticated By: Richarda Overlie, M.D.   US Renal  08/04/2011  *RADIOLOGY REPORT*  Clinical Data: Flank pain.  Probable renal stones seen on the left on today's abdominal radiograph.  Left-sided renal stones seen on CT abdomen pelvis January 2013.  RENAL/URINARY TRACT ULTRASOUND COMPLETE  Comparison:  Abdominal radiograph 08/04/2011 and CT stone study 03/04/2011.  Findings:  Right Kidney:  Right kidney is normal in size, contour, and echogenicity.  It measures 11.7 cm in length.  There is no hydronephrosis or evidence of mass.  Left Kidney:  The left kidney is normal in size, contour, and echogenicity.  An echogenic linear area measuring 6 mm maximal diameter in the lower pole results in posterior acoustic shadowing, compatible with a small stone.  There is no hydronephrosis or evidence of renal mass.  Bladder:  Appears normal for degree of distention.  IMPRESSION:  1.  Negative for hydronephrosis. 2.  Nonobstructing 6 mm left lower pole calculus.  Original Report Authenticated By: Britta Mccreedy, M.D.      1. Left flank pain   2. Hematuria       MDM  Probable left ureterolithiasis. Old records are reviewed and she had lithotripsy of a left ureteral calculus  in April of 2012 and there was a residual 4 mm calculus in the left kidney. KUB in February of this year showed the calculus was still in the same place in the left kidney. She has had several CT scans of her abdomen. Since calculus is visible on plain film, a plain x-ray will be obtained to see if it has changed in location and ultrasound will be obtained to evaluate for possible hydronephrosis.  Ultrasound showed nephrolithiasis without ureterolithiasis or hydronephrosis. She got good relief of pain with a dose of hydromorphone and but then started complaining of increased pain. She did not get any relief with ketorolac and it required a second dose of hydromorphone. Because the  stone does not seem to be in a position that should cause pain in her pain was so severe, was elected to get a CT scan with contrast to see if there is another cause for her pain and this is unremarkable except for the known kidney stone. Also, certain disposition should not normally cause hematuria. She is referred back to [facility] for further evaluation and is discharged with a prescription for Percocet for pain.       Dione Booze, MD 08/04/11 1248

## 2011-08-29 ENCOUNTER — Emergency Department: Payer: Self-pay | Admitting: *Deleted

## 2011-08-29 LAB — URINALYSIS, COMPLETE
Bilirubin,UR: NEGATIVE
Glucose,UR: NEGATIVE mg/dL (ref 0–75)
Ketone: NEGATIVE
Nitrite: NEGATIVE
Ph: 6 (ref 4.5–8.0)
Protein: 100
RBC,UR: 489 /HPF (ref 0–5)
Specific Gravity: 1.021 (ref 1.003–1.030)
Squamous Epithelial: 13
WBC UR: 2 /HPF (ref 0–5)

## 2011-08-29 LAB — BASIC METABOLIC PANEL
Anion Gap: 7 (ref 7–16)
BUN: 19 mg/dL — ABNORMAL HIGH (ref 7–18)
Calcium, Total: 8.9 mg/dL (ref 8.5–10.1)
Chloride: 105 mmol/L (ref 98–107)
Co2: 25 mmol/L (ref 21–32)
Creatinine: 0.9 mg/dL (ref 0.60–1.30)
EGFR (African American): >60
EGFR (Non-African Amer.): >60
Glucose: 91 mg/dL (ref 65–99)
Osmolality: 276 (ref 275–301)
Potassium: 3.7 mmol/L (ref 3.5–5.1)
Sodium: 137 mmol/L (ref 136–145)

## 2011-08-29 LAB — CBC WITH DIFFERENTIAL/PLATELET
Basophil #: 0.1 10*3/uL (ref 0.0–0.1)
Basophil %: 0.8 %
Eosinophil #: 0.2 10*3/uL (ref 0.0–0.7)
Eosinophil %: 1.4 %
HCT: 39.5 % (ref 35.0–47.0)
HGB: 13.2 g/dL (ref 12.0–16.0)
Lymphocyte #: 3.1 10*3/uL (ref 1.0–3.6)
Lymphocyte %: 24.7 %
MCH: 30.4 pg (ref 26.0–34.0)
MCHC: 33.4 g/dL (ref 32.0–36.0)
MCV: 91 fL (ref 80–100)
Monocyte #: 0.9 x10 3/mm (ref 0.2–0.9)
Monocyte %: 6.8 %
Neutrophil #: 8.4 10*3/uL — ABNORMAL HIGH (ref 1.4–6.5)
Neutrophil %: 66.3 %
Platelet: 240 10*3/uL (ref 150–440)
RBC: 4.34 10*6/uL (ref 3.80–5.20)
RDW: 13.2 % (ref 11.5–14.5)
WBC: 12.6 10*3/uL — ABNORMAL HIGH (ref 3.6–11.0)

## 2011-09-03 ENCOUNTER — Emergency Department: Payer: Self-pay | Admitting: Emergency Medicine

## 2011-09-03 LAB — CBC
HCT: 40.6 % (ref 35.0–47.0)
HGB: 13.7 g/dL (ref 12.0–16.0)
MCH: 31.2 pg (ref 26.0–34.0)
MCHC: 33.8 g/dL (ref 32.0–36.0)
MCV: 92 fL (ref 80–100)
Platelet: 244 10*3/uL (ref 150–440)
RBC: 4.4 10*6/uL (ref 3.80–5.20)
RDW: 13.4 % (ref 11.5–14.5)
WBC: 13.5 10*3/uL — ABNORMAL HIGH (ref 3.6–11.0)

## 2011-09-03 LAB — URINALYSIS, COMPLETE
Bilirubin,UR: NEGATIVE
Glucose,UR: NEGATIVE mg/dL (ref 0–75)
Ketone: NEGATIVE
Leukocyte Esterase: NEGATIVE
Nitrite: NEGATIVE
Ph: 7 (ref 4.5–8.0)
Protein: NEGATIVE
RBC,UR: 1593 /HPF (ref 0–5)
Specific Gravity: 1.011 (ref 1.003–1.030)
Squamous Epithelial: 3
WBC UR: 7 /HPF (ref 0–5)

## 2011-09-03 LAB — PREGNANCY, URINE: Pregnancy Test, Urine: NEGATIVE m[IU]/mL

## 2011-09-03 LAB — BASIC METABOLIC PANEL
Anion Gap: 12 (ref 7–16)
BUN: 13 mg/dL (ref 7–18)
Calcium, Total: 8.9 mg/dL (ref 8.5–10.1)
Chloride: 104 mmol/L (ref 98–107)
Co2: 24 mmol/L (ref 21–32)
Creatinine: 0.71 mg/dL (ref 0.60–1.30)
EGFR (African American): >60
EGFR (Non-African Amer.): >60
Glucose: 93 mg/dL (ref 65–99)
Osmolality: 279 (ref 275–301)
Potassium: 3.7 mmol/L (ref 3.5–5.1)
Sodium: 140 mmol/L (ref 136–145)

## 2011-09-04 LAB — URINE CULTURE

## 2011-09-27 ENCOUNTER — Other Ambulatory Visit: Payer: Self-pay | Admitting: Pediatrics

## 2011-10-08 ENCOUNTER — Emergency Department (HOSPITAL_COMMUNITY)
Admission: EM | Admit: 2011-10-08 | Discharge: 2011-10-08 | Disposition: A | Discharge status: Home / Self Care | Payer: BC Managed Care – PPO | Attending: Emergency Medicine | Admitting: Emergency Medicine

## 2011-10-08 ENCOUNTER — Encounter (HOSPITAL_COMMUNITY): Payer: Self-pay | Admitting: Adult Health

## 2011-10-08 DIAGNOSIS — Z87891 Personal history of nicotine dependence: Secondary | ICD-10-CM | POA: Insufficient documentation

## 2011-10-08 DIAGNOSIS — G43909 Migraine, unspecified, not intractable, without status migrainosus: Secondary | ICD-10-CM

## 2011-10-08 MED ORDER — SODIUM CHLORIDE 0.9 % IV BOLUS (SEPSIS)
1000.0000 mL | Freq: Once | INTRAVENOUS | Status: AC
  Administered 2011-10-08: 1000 mL via INTRAVENOUS

## 2011-10-08 MED ORDER — DEXAMETHASONE SODIUM PHOSPHATE 10 MG/ML IJ SOLN
10.0000 mg | Freq: Once | INTRAMUSCULAR | Status: DC
  Filled 2011-10-08: qty 1

## 2011-10-08 MED ORDER — KETOROLAC TROMETHAMINE 30 MG/ML IJ SOLN
30.0000 mg | Freq: Once | INTRAMUSCULAR | Status: AC
  Administered 2011-10-08: 30 mg via INTRAVENOUS
  Filled 2011-10-08 (×2): qty 1

## 2011-10-08 MED ORDER — LORAZEPAM 2 MG/ML IJ SOLN
1.0000 mg | Freq: Once | INTRAMUSCULAR | Status: AC
  Administered 2011-10-08: 1 mg via INTRAVENOUS
  Filled 2011-10-08: qty 1

## 2011-10-08 MED ORDER — DEXAMETHASONE SODIUM PHOSPHATE 10 MG/ML IJ SOLN
10.0000 mg | Freq: Once | INTRAMUSCULAR | Status: AC
  Administered 2011-10-08: 10 mg via INTRAVENOUS

## 2011-10-08 MED ORDER — DIPHENHYDRAMINE HCL 50 MG/ML IJ SOLN
25.0000 mg | Freq: Once | INTRAMUSCULAR | Status: AC
  Administered 2011-10-08: 25 mg via INTRAVENOUS
  Filled 2011-10-08: qty 1

## 2011-10-08 NOTE — ED Notes (Signed)
Reports 4 days of migraine associated with nausea and vomiting, light sensitivity and sound. Pain is located on right side. Pt is alert and oriented, answers all questions appropriately.

## 2011-10-08 NOTE — ED Provider Notes (Signed)
History     CSN: [id number]  Arrival date & time 10/08/11  0102   First MD Initiated Contact with Patient 10/08/11 0228      Chief Complaint  Patient presents with  . Migraine    (Consider location/radiation/quality/duration/timing/severity/associated sxs/prior treatment) HPI Comments: Patient with a history of chronic migraines presents emergency department with a chief complaint of headache.  Onset began approximately 4 days ago, location is right frontal region, characterized as a throbbing sensation, severity 10/10, associated symptoms including photophobia, sensitivity to sound nausea, and vomiting x1.  Treatments tried include Relpax, gabapentin, and Phenergan.  Patient states that her headaches used to be cured by trigger point injections and then later her Botox injections that she would receive every 3 months.  The injections strike that she is due to get these injections again however she's not had time.  She states presentation is very similar to her chronic migraines and denies any change in vision, ataxia, disequilibrium, weakness, facial droop, slurred speech, fever, night sweats or chills.  Patient is a 31 y.o. female presenting with migraine. The history is provided by the patient.  Migraine Associated symptoms include headaches, nausea and vomiting. Pertinent negatives include no abdominal pain, chest pain, chills, congestion, coughing, diaphoresis, fatigue, fever, myalgias, neck pain, numbness, rash or weakness.    Past Medical History  Diagnosis Date  . Migraine   . Kidney stone   . Kidney stones   . Gallstones   . Kidney stone     Past Surgical History  Procedure Date  . Abdominal hysterectomy   . Tonsillectomy   . Lithotripsy   . Cholecystectomy 03/11/2011    Procedure: LAPAROSCOPIC CHOLECYSTECTOMY WITH INTRAOPERATIVE CHOLANGIOGRAM;  Surgeon: Atilano Ina, MD;  Location: [facility] OR;  Service: General;  Laterality: N/A;  . Cholecystectomy, laparoscopic      Family History  Problem Relation Age of Onset  . Diabetes Mother   . Diabetes Father   . Heart failure Father   . Hypertension Father   . Pulmonary fibrosis Father     History  Substance Use Topics  . Smoking status: Former Smoker -- 0.5 packs/day    Types: Cigarettes  . Smokeless tobacco: Not on file  . Alcohol Use: Yes     occaisional    OB History    Grav Para Term Preterm Abortions TAB SAB Ect Mult Living                  Review of Systems  Constitutional: Positive for activity change. Negative for fever, chills, diaphoresis and fatigue.  HENT: Negative for ear pain, congestion, facial swelling, neck pain, neck stiffness, sinus pressure and tinnitus.   Eyes: Positive for photophobia. Negative for redness and visual disturbance.  Respiratory: Negative for cough, shortness of breath, wheezing and stridor.   Cardiovascular: Negative for chest pain.  Gastrointestinal: Positive for nausea and vomiting. Negative for abdominal pain.  Musculoskeletal: Negative for myalgias and gait problem.  Skin: Negative for rash.  Neurological: Positive for headaches. Negative for dizziness, syncope, speech difficulty, weakness, light-headedness and numbness.       No bowel or bladder incontinence.  Psychiatric/Behavioral: Negative for confusion.  All other systems reviewed and are negative.    Allergies  Compazine and Metoclopramide hcl  Home Medications   Current Outpatient Rx  Name Route Sig Dispense Refill  . ELETRIPTAN HYDROBROMIDE 40 MG PO TABS Oral One tablet by mouth as needed for migraine headache.  If the headache improves and then returns, dose  may be repeated after 2 hours have elapsed since first dose (do not exceed 80 mg per day). may repeat in 2 hours if necessary-for pain    . ESCITALOPRAM OXALATE 20 MG PO TABS Oral Take 40 mg by mouth daily.    Marland Kitchen GABAPENTIN 600 MG PO TABS Oral Take 600 mg by mouth at bedtime.     . TORADOL ORAL PO Oral Take 1 tablet by mouth.     . OMEPRAZOLE 20 MG PO CPDR Oral Take 20 mg by mouth daily.      Marland Kitchen PROMETHAZINE HCL 25 MG PO TABS Oral Take 25 mg by mouth as needed. For nausea and vomiting     . VITAMIN D (CHOLECALCIFEROL) PO Oral Take 1 tablet by mouth daily.       BP 109/65  Pulse 85  Temp 99 F (37.2 C) (Oral)  Resp 16  SpO2 98%  Physical Exam  Nursing note and vitals reviewed. Constitutional: She is oriented to person, place, and time. She appears well-developed and well-nourished. No distress.  HENT:  Head: Normocephalic and atraumatic.  Right Ear: External ear normal.  Left Ear: External ear normal.  Eyes: Conjunctivae and EOM are normal. Pupils are equal, round, and reactive to light. Right eye exhibits no discharge. Left eye exhibits no discharge. Right conjunctiva is not injected. Right conjunctiva has no hemorrhage. Left conjunctiva is not injected. Left conjunctiva has no hemorrhage. No scleral icterus. Right eye exhibits no nystagmus. Left eye exhibits no nystagmus.  Neck: Normal range of motion and full passive range of motion without pain. Neck supple. No spinous process tenderness present. No rigidity.  Cardiovascular: Normal rate, regular rhythm, normal heart sounds and intact distal pulses.   Pulmonary/Chest: Effort normal and breath sounds normal. No respiratory distress.  Musculoskeletal: Normal range of motion.  Neurological: She is alert and oriented to person, place, and time. She has normal strength. No cranial nerve deficit or sensory deficit. Coordination and gait normal.  Skin: Skin is warm and dry. No rash noted. She is not diaphoretic.    ED Course  Procedures (including critical care time)  Labs Reviewed - No data to display No results found.   No diagnosis found.    MDM  HA  Pt HA treated and improved while in ED.  Presentation is like pts typical HA and non concerning for [facility], ICH, Meningitis, or temporal arteritis. Pt is afebrile with no focal neuro deficits, nuchal  rigidity, or change in vision. Pt is to follow up with PCP to discuss prophylactic medication. Pt verbalizes understanding and is agreeable with plan to dc.          Jaci Carrel, [place] 10/08/11 0430

## 2011-10-08 NOTE — ED Notes (Signed)
The patient is AOx4 and comfortable with her discharge instructions. 

## 2011-10-08 NOTE — ED Provider Notes (Signed)
Medical screening examination/treatment/procedure(s) were performed by non-physician practitioner and as supervising physician I was immediately available for consultation/collaboration.   Thomson Herbers M Jakorian Marengo, MD 10/08/11 1708 

## 2011-10-09 ENCOUNTER — Emergency Department: Payer: Self-pay | Admitting: Emergency Medicine

## 2011-10-09 LAB — URINALYSIS, COMPLETE
Bilirubin,UR: NEGATIVE
Glucose,UR: NEGATIVE mg/dL (ref 0–75)
Ketone: NEGATIVE
Leukocyte Esterase: NEGATIVE
Nitrite: NEGATIVE
Ph: 5 (ref 4.5–8.0)
Protein: NEGATIVE
RBC,UR: 34 /HPF (ref 0–5)
Specific Gravity: 1.024 (ref 1.003–1.030)
Squamous Epithelial: 5
WBC UR: 3 /HPF (ref 0–5)

## 2011-10-10 LAB — COMPREHENSIVE METABOLIC PANEL
Albumin: 3.2 g/dL — ABNORMAL LOW (ref 3.4–5.0)
Alkaline Phosphatase: 75 U/L (ref 50–136)
Anion Gap: 8 (ref 7–16)
BUN: 11 mg/dL (ref 7–18)
Bilirubin,Total: 0.1 mg/dL — ABNORMAL LOW (ref 0.2–1.0)
Calcium, Total: 8.6 mg/dL (ref 8.5–10.1)
Chloride: 111 mmol/L — ABNORMAL HIGH (ref 98–107)
Co2: 25 mmol/L (ref 21–32)
Creatinine: 0.8 mg/dL (ref 0.60–1.30)
EGFR (African American): >60
EGFR (Non-African Amer.): >60
Glucose: 125 mg/dL — ABNORMAL HIGH (ref 65–99)
Osmolality: 288 (ref 275–301)
Potassium: 3.8 mmol/L (ref 3.5–5.1)
SGOT(AST): 14 U/L — ABNORMAL LOW (ref 15–37)
SGPT (ALT): 15 U/L (ref 12–78)
Sodium: 144 mmol/L (ref 136–145)
Total Protein: 6.5 g/dL (ref 6.4–8.2)

## 2011-10-10 LAB — CBC
HCT: 33.9 % — ABNORMAL LOW (ref 35.0–47.0)
HGB: 11.6 g/dL — ABNORMAL LOW (ref 12.0–16.0)
MCH: 31.5 pg (ref 26.0–34.0)
MCHC: 34.1 g/dL (ref 32.0–36.0)
MCV: 92 fL (ref 80–100)
Platelet: 194 10*3/uL (ref 150–440)
RBC: 3.67 10*6/uL — ABNORMAL LOW (ref 3.80–5.20)
RDW: 13.8 % (ref 11.5–14.5)
WBC: 14 10*3/uL — ABNORMAL HIGH (ref 3.6–11.0)

## 2011-12-06 ENCOUNTER — Encounter (HOSPITAL_COMMUNITY): Payer: Self-pay

## 2011-12-06 ENCOUNTER — Emergency Department (HOSPITAL_COMMUNITY)
Admission: EM | Admit: 2011-12-06 | Discharge: 2011-12-06 | Disposition: A | Discharge status: Home / Self Care | Payer: BC Managed Care – PPO | Attending: Emergency Medicine | Admitting: Emergency Medicine

## 2011-12-06 ENCOUNTER — Emergency Department (HOSPITAL_COMMUNITY): Payer: BC Managed Care – PPO

## 2011-12-06 DIAGNOSIS — R509 Fever, unspecified: Secondary | ICD-10-CM | POA: Insufficient documentation

## 2011-12-06 DIAGNOSIS — R109 Unspecified abdominal pain: Secondary | ICD-10-CM | POA: Insufficient documentation

## 2011-12-06 DIAGNOSIS — R110 Nausea: Secondary | ICD-10-CM | POA: Insufficient documentation

## 2011-12-06 DIAGNOSIS — N390 Urinary tract infection, site not specified: Secondary | ICD-10-CM | POA: Insufficient documentation

## 2011-12-06 DIAGNOSIS — Z79899 Other long term (current) drug therapy: Secondary | ICD-10-CM | POA: Insufficient documentation

## 2011-12-06 LAB — URINALYSIS, ROUTINE W REFLEX MICROSCOPIC
Bilirubin Urine: NEGATIVE
Glucose, UA: NEGATIVE mg/dL
Ketones, ur: NEGATIVE mg/dL
Nitrite: NEGATIVE
Protein, ur: NEGATIVE mg/dL
Specific Gravity, Urine: 1.02 (ref 1.005–1.030)
Urobilinogen, UA: 0.2 mg/dL (ref 0.0–1.0)
pH: 7.5 (ref 5.0–8.0)

## 2011-12-06 LAB — URINE MICROSCOPIC-ADD ON

## 2011-12-06 LAB — POCT PREGNANCY, URINE: Preg Test, Ur: NEGATIVE

## 2011-12-06 MED ORDER — MORPHINE SULFATE 4 MG/ML IJ SOLN
4.0000 mg | Freq: Once | INTRAMUSCULAR | Status: AC
  Administered 2011-12-06: 4 mg via INTRAVENOUS
  Filled 2011-12-06: qty 1

## 2011-12-06 MED ORDER — ONDANSETRON HCL 4 MG/2ML IJ SOLN
4.0000 mg | Freq: Once | INTRAMUSCULAR | Status: AC
  Administered 2011-12-06: 4 mg via INTRAVENOUS
  Filled 2011-12-06: qty 2

## 2011-12-06 MED ORDER — HYDROCODONE-ACETAMINOPHEN 5-500 MG PO TABS: 1.0000 | ORAL_TABLET | Freq: Four times a day (QID) | ORAL | Status: DC | PRN

## 2011-12-06 MED ORDER — CIPROFLOXACIN HCL 500 MG PO TABS: 500.0000 mg | ORAL_TABLET | Freq: Two times a day (BID) | ORAL | Status: DC

## 2011-12-06 MED ORDER — KETOROLAC TROMETHAMINE 30 MG/ML IJ SOLN
30.0000 mg | Freq: Once | INTRAMUSCULAR | Status: AC
  Administered 2011-12-06: 30 mg via INTRAVENOUS
  Filled 2011-12-06: qty 1

## 2011-12-06 NOTE — ED Notes (Signed)
Pt complaining of pain in the epigastric area and says that her left side pain has subsided.Morphine given as ordered.

## 2011-12-06 NOTE — ED Notes (Signed)
Pt for discharge.Vital signs stable and GCS 15 

## 2011-12-06 NOTE — ED Provider Notes (Signed)
History     CSN: [id number]  Arrival date & time 12/06/11  0105   First MD Initiated Contact with Patient 12/06/11 0201      Chief Complaint  Patient presents with  . Flank Pain    (Consider location/radiation/quality/duration/timing/severity/associated sxs/prior treatment) HPI Comments: Patient with history of kidney stones, uti.  Presents with left flank pain starting two weeks ago, but worsening over the past few days.  She reports fevers and nausea at home.  History of hysterectomy due to endometriosis.    Patient is a 31 y.o. female presenting with flank pain. The history is provided by the patient.  Flank Pain This is a recurrent problem. Episode onset: 2 weeks ago. The problem occurs constantly. The problem has been gradually worsening. Associated symptoms include abdominal pain. Nothing aggravates the symptoms. Nothing relieves the symptoms. She has tried nothing for the symptoms.    Past Medical History  Diagnosis Date  . Migraine   . Kidney stone   . Kidney stones   . Gallstones   . Kidney stone     Past Surgical History  Procedure Date  . Abdominal hysterectomy   . Tonsillectomy   . Lithotripsy   . Cholecystectomy 03/11/2011    Procedure: LAPAROSCOPIC CHOLECYSTECTOMY WITH INTRAOPERATIVE CHOLANGIOGRAM;  Surgeon: Atilano Ina, MD;  Location: [facility] OR;  Service: General;  Laterality: N/A;  . Cholecystectomy, laparoscopic     Family History  Problem Relation Age of Onset  . Diabetes Mother   . Diabetes Father   . Heart failure Father   . Hypertension Father   . Pulmonary fibrosis Father     History  Substance Use Topics  . Smoking status: Former Smoker -- 0.5 packs/day    Types: Cigarettes  . Smokeless tobacco: Not on file  . Alcohol Use: Yes     occaisional    OB History    Grav Para Term Preterm Abortions TAB SAB Ect Mult Living                  Review of Systems  Gastrointestinal: Positive for abdominal pain.  Genitourinary: Positive for  flank pain.  All other systems reviewed and are negative.    Allergies  Compazine and Metoclopramide hcl  Home Medications   Current Outpatient Rx  Name Route Sig Dispense Refill  . ESCITALOPRAM OXALATE 20 MG PO TABS Oral Take 40 mg by mouth daily.    Marland Kitchen NAPROXEN SODIUM 220 MG PO TABS Oral Take 220 mg by mouth 2 (two) times daily with a meal.    . OMEPRAZOLE 20 MG PO CPDR Oral Take 20 mg by mouth daily.      Marland Kitchen TAMSULOSIN HCL 0.4 MG PO CAPS Oral Take 0.4 mg by mouth daily.    Marland Kitchen VITAMIN D (CHOLECALCIFEROL) PO Oral Take 1 tablet by mouth daily.       BP 131/90  Pulse 143  Temp 99.8 F (37.7 C) (Oral)  Resp 16  SpO2 99%  Physical Exam  Nursing note and vitals reviewed. Constitutional: She is oriented to person, place, and time. She appears well-developed and well-nourished. No distress.  HENT:  Head: Normocephalic and atraumatic.  Neck: Normal range of motion. Neck supple.  Cardiovascular: Normal rate and regular rhythm.  Exam reveals no gallop and no friction rub.   No murmur heard. Pulmonary/Chest: Effort normal and breath sounds normal. No respiratory distress. She has no wheezes.  Abdominal: Soft. Bowel sounds are normal. She exhibits no distension.  There is mild left sided cva ttp.  The abdominal exam is otherwise benign.  Musculoskeletal: Normal range of motion.  Neurological: She is alert and oriented to person, place, and time.  Skin: Skin is warm and dry. She is not diaphoretic.    ED Course  Procedures (including critical care time)  Labs Reviewed  URINALYSIS, ROUTINE W REFLEX MICROSCOPIC - Abnormal; Notable for the following:    Color, Urine AMBER (*)  BIOCHEMICALS MAY BE AFFECTED BY COLOR   APPearance TURBID (*)     Hgb urine dipstick LARGE (*)     Leukocytes, UA MODERATE (*)     All other components within normal limits  URINE MICROSCOPIC-ADD ON - Abnormal; Notable for the following:    Squamous Epithelial / LPF MANY (*)     Bacteria, UA MANY (*)       All other components within normal limits  POCT PREGNANCY, URINE   No results found.   No diagnosis found.    MDM  Patient presents with pain in the left flank.  The urine shows a uti and the ct does not reveal a stone.  She was hydrated and is feeling better.  The initial tachycardia in her vitals has since resolved.  She will be discharged with antibiotics and pain meds.          Geoffery Lyons, MD 12/06/11 0330

## 2011-12-06 NOTE — ED Notes (Signed)
Pt reports intermittent  (L) flank pain x2 weeks, worsening pain tonight approx 2100, pt reports hx of kidney stones started taking her prescribed Flomax and Ibuprofen x3 days.

## 2011-12-07 ENCOUNTER — Emergency Department: Payer: Self-pay | Admitting: Internal Medicine

## 2011-12-11 DIAGNOSIS — S82852A Displaced trimalleolar fracture of left lower leg, initial encounter for closed fracture: Secondary | ICD-10-CM | POA: Insufficient documentation

## 2011-12-17 ENCOUNTER — Emergency Department (HOSPITAL_COMMUNITY)
Admission: EM | Admit: 2011-12-17 | Discharge: 2011-12-17 | Disposition: A | Discharge status: Home / Self Care | Payer: BC Managed Care – PPO | Attending: Emergency Medicine | Admitting: Emergency Medicine

## 2011-12-17 ENCOUNTER — Encounter (HOSPITAL_COMMUNITY): Payer: Self-pay | Admitting: *Deleted

## 2011-12-17 ENCOUNTER — Emergency Department (HOSPITAL_COMMUNITY): Payer: BC Managed Care – PPO

## 2011-12-17 DIAGNOSIS — N200 Calculus of kidney: Secondary | ICD-10-CM

## 2011-12-17 DIAGNOSIS — Z87442 Personal history of urinary calculi: Secondary | ICD-10-CM | POA: Insufficient documentation

## 2011-12-17 DIAGNOSIS — Z8679 Personal history of other diseases of the circulatory system: Secondary | ICD-10-CM | POA: Insufficient documentation

## 2011-12-17 DIAGNOSIS — Z8719 Personal history of other diseases of the digestive system: Secondary | ICD-10-CM | POA: Insufficient documentation

## 2011-12-17 DIAGNOSIS — R319 Hematuria, unspecified: Secondary | ICD-10-CM

## 2011-12-17 DIAGNOSIS — Z87891 Personal history of nicotine dependence: Secondary | ICD-10-CM | POA: Insufficient documentation

## 2011-12-17 LAB — CBC
HCT: 38.5 % (ref 36.0–46.0)
Hemoglobin: 13.1 g/dL (ref 12.0–15.0)
MCH: 31 pg (ref 26.0–34.0)
MCHC: 34 g/dL (ref 30.0–36.0)
MCV: 91 fL (ref 78.0–100.0)
Platelets: 220 10*3/uL (ref 150–400)
RBC: 4.23 MIL/uL (ref 3.87–5.11)
RDW: 12.9 % (ref 11.5–15.5)
WBC: 11.3 10*3/uL — ABNORMAL HIGH (ref 4.0–10.5)

## 2011-12-17 LAB — URINALYSIS, ROUTINE W REFLEX MICROSCOPIC
Bilirubin Urine: NEGATIVE
Glucose, UA: NEGATIVE mg/dL
Ketones, ur: NEGATIVE mg/dL
Nitrite: NEGATIVE
Protein, ur: NEGATIVE mg/dL
Specific Gravity, Urine: 1.015 (ref 1.005–1.030)
Urobilinogen, UA: 0.2 mg/dL (ref 0.0–1.0)
pH: 6 (ref 5.0–8.0)

## 2011-12-17 LAB — COMPREHENSIVE METABOLIC PANEL
ALT: 9 U/L (ref 0–35)
AST: 13 U/L (ref 0–37)
Albumin: 3.5 g/dL (ref 3.5–5.2)
Alkaline Phosphatase: 86 U/L (ref 39–117)
BUN: 13 mg/dL (ref 6–23)
CO2: 25 mEq/L (ref 19–32)
Calcium: 9.5 mg/dL (ref 8.4–10.5)
Chloride: 103 mEq/L (ref 96–112)
Creatinine, Ser: 0.59 mg/dL (ref 0.50–1.10)
GFR calc Af Amer: >90 mL/min (ref >90)
GFR calc non Af Amer: >90 mL/min (ref >90)
Glucose, Bld: 88 mg/dL (ref 70–99)
Potassium: 3.5 mEq/L (ref 3.5–5.1)
Sodium: 139 mEq/L (ref 135–145)
Total Bilirubin: 0.2 mg/dL — ABNORMAL LOW (ref 0.3–1.2)
Total Protein: 6.7 g/dL (ref 6.0–8.3)

## 2011-12-17 LAB — URINE MICROSCOPIC-ADD ON

## 2011-12-17 MED ORDER — HYDROMORPHONE HCL PF 1 MG/ML IJ SOLN
1.0000 mg | Freq: Once | INTRAMUSCULAR | Status: AC
  Administered 2011-12-17: 1 mg via INTRAVENOUS
  Filled 2011-12-17: qty 1

## 2011-12-17 MED ORDER — OXYCODONE-ACETAMINOPHEN 5-325 MG PO TABS: 2.0000 | ORAL_TABLET | ORAL | Status: DC | PRN

## 2011-12-17 MED ORDER — SODIUM CHLORIDE 0.9 % IV BOLUS (SEPSIS)
1000.0000 mL | Freq: Once | INTRAVENOUS | Status: AC
  Administered 2011-12-17: 1000 mL via INTRAVENOUS

## 2011-12-17 MED ORDER — ONDANSETRON HCL 4 MG/2ML IJ SOLN
4.0000 mg | Freq: Once | INTRAMUSCULAR | Status: AC
  Administered 2011-12-17: 4 mg via INTRAVENOUS
  Filled 2011-12-17: qty 2

## 2011-12-17 MED ORDER — ONDANSETRON HCL 4 MG PO TABS: 4.0000 mg | ORAL_TABLET | Freq: Four times a day (QID) | ORAL | Status: DC

## 2011-12-17 NOTE — ED Notes (Addendum)
Patient transported to CT 

## 2011-12-17 NOTE — ED Provider Notes (Addendum)
History     CSN: [id number]  Arrival date & time 12/17/11  0244   First MD Initiated Contact with Patient 12/17/11 0304      Chief Complaint  Patient presents with  . Flank Pain    (Consider location/radiation/quality/duration/timing/severity/associated sxs/prior treatment) Patient is a 31 y.o. female presenting with flank pain. The history is provided by the patient.  Flank Pain This is a new problem. The current episode started yesterday. The problem occurs constantly. The problem has been gradually worsening. Associated symptoms include abdominal pain. Pertinent negatives include no chest pain, no headaches and no shortness of breath. Associated symptoms comments: hematuria. Nothing aggravates the symptoms. Nothing relieves the symptoms.    Past Medical History  Diagnosis Date  . Migraine   . Kidney stone   . Kidney stones   . Gallstones   . Kidney stone     Past Surgical History  Procedure Date  . Abdominal hysterectomy   . Tonsillectomy   . Lithotripsy   . Cholecystectomy 03/11/2011    Procedure: LAPAROSCOPIC CHOLECYSTECTOMY WITH INTRAOPERATIVE CHOLANGIOGRAM;  Surgeon: Atilano Ina, MD;  Location: [facility] OR;  Service: General;  Laterality: N/A;  . Cholecystectomy, laparoscopic     Family History  Problem Relation Age of Onset  . Diabetes Mother   . Diabetes Father   . Heart failure Father   . Hypertension Father   . Pulmonary fibrosis Father     History  Substance Use Topics  . Smoking status: Former Smoker -- 0.5 packs/day    Types: Cigarettes  . Smokeless tobacco: Not on file  . Alcohol Use: Yes     occaisional    OB History    Grav Para Term Preterm Abortions TAB SAB Ect Mult Living                  Review of Systems  Respiratory: Negative for shortness of breath.   Cardiovascular: Negative for chest pain.  Gastrointestinal: Positive for abdominal pain.  Genitourinary: Positive for hematuria and flank pain.  Neurological: Negative for  headaches.  All other systems reviewed and are negative.    Allergies  Compazine and Metoclopramide hcl  Home Medications   Current Outpatient Rx  Name Route Sig Dispense Refill  . ESCITALOPRAM OXALATE 20 MG PO TABS Oral Take 40 mg by mouth daily.    Marland Kitchen NAPROXEN SODIUM 220 MG PO TABS Oral Take 220 mg by mouth 2 (two) times daily with a meal.    . OMEPRAZOLE 20 MG PO CPDR Oral Take 20 mg by mouth daily.      Marland Kitchen VITAMIN D (CHOLECALCIFEROL) PO Oral Take 1 tablet by mouth daily.       BP 114/77  Pulse 96  Temp 98.5 F (36.9 C) (Oral)  Resp 20  SpO2 100%  Physical Exam  Constitutional: She is oriented to person, place, and time. She appears well-developed and well-nourished.  HENT:  Head: Normocephalic and atraumatic.  Eyes: Conjunctivae normal and EOM are normal. Pupils are equal, round, and reactive to light.  Neck: Normal range of motion.  Cardiovascular: Regular rhythm and normal heart sounds.  Tachycardia present.   Pulmonary/Chest: Effort normal and breath sounds normal.  Abdominal: Soft. Bowel sounds are normal. There is tenderness. There is CVA tenderness.  Musculoskeletal: Normal range of motion.  Neurological: She is alert and oriented to person, place, and time.  Skin: Skin is warm and dry.  Psychiatric: She has a normal mood and affect. Her behavior is normal.  ED Course  Procedures (including critical care time)   Labs Reviewed  CBC  COMPREHENSIVE METABOLIC PANEL  URINALYSIS, ROUTINE W REFLEX MICROSCOPIC  URINE CULTURE   No results found.   No diagnosis found.    MDM  + flank pain, hematuria,  Recent admission with hx of ureteric stent.  Will lab, analgesia, ct,  reassess   Ct neg.  No infection.  Improved.  Will dc to fu out, ret new/worsening sxs     Myeshia Fojtik Lytle Michaels, MD 12/17/11 0330  Jaymari Cromie Lytle Michaels, MD 12/17/11 [id number]

## 2011-12-17 NOTE — ED Notes (Signed)
Pt with hx of renal calculi and uti to ED c/o R flank pain since Fri.  Pain is increasing.  Denies nausea.

## 2011-12-17 NOTE — ED Notes (Signed)
Patient transported to CT 

## 2011-12-18 LAB — URINE CULTURE
Colony Count: NO GROWTH
Culture: NO GROWTH

## 2011-12-25 ENCOUNTER — Encounter (HOSPITAL_COMMUNITY): Payer: Self-pay

## 2011-12-25 ENCOUNTER — Observation Stay (HOSPITAL_COMMUNITY): Payer: BC Managed Care – PPO

## 2011-12-25 ENCOUNTER — Observation Stay (HOSPITAL_COMMUNITY)
Admission: EM | Admit: 2011-12-25 | Discharge: 2011-12-26 | Disposition: A | Discharge status: Home / Self Care | Payer: BC Managed Care – PPO | Attending: Specialist | Admitting: Specialist

## 2011-12-25 ENCOUNTER — Encounter (HOSPITAL_COMMUNITY)
Admission: EM | Disposition: A | Discharge status: Home / Self Care | Payer: Self-pay | Source: Home / Self Care | Attending: Specialist

## 2011-12-25 ENCOUNTER — Encounter (HOSPITAL_COMMUNITY): Payer: Self-pay | Admitting: Anesthesiology

## 2011-12-25 ENCOUNTER — Emergency Department (HOSPITAL_COMMUNITY): Payer: BC Managed Care – PPO

## 2011-12-25 ENCOUNTER — Observation Stay (HOSPITAL_COMMUNITY): Payer: BC Managed Care – PPO | Admitting: Anesthesiology

## 2011-12-25 DIAGNOSIS — S82852A Displaced trimalleolar fracture of left lower leg, initial encounter for closed fracture: Secondary | ICD-10-CM

## 2011-12-25 DIAGNOSIS — X58XXXA Exposure to other specified factors, initial encounter: Secondary | ICD-10-CM | POA: Insufficient documentation

## 2011-12-25 DIAGNOSIS — S82853A Displaced trimalleolar fracture of unspecified lower leg, initial encounter for closed fracture: Principal | ICD-10-CM | POA: Insufficient documentation

## 2011-12-25 HISTORY — PX: CAST APPLICATION: SHX380

## 2011-12-25 HISTORY — PX: ORIF ANKLE FRACTURE: SHX5408

## 2011-12-25 SURGERY — OPEN REDUCTION INTERNAL FIXATION (ORIF) ANKLE FRACTURE
Anesthesia: General | Site: Ankle | Laterality: Left | Wound class: Clean

## 2011-12-25 MED ORDER — PROPOFOL 10 MG/ML IV BOLUS
INTRAVENOUS | Status: AC | PRN
  Administered 2011-12-25: 22.675 mg via INTRAVENOUS
  Administered 2011-12-25: 45.35 mg via INTRAVENOUS
  Administered 2011-12-25: 22.675 mg via INTRAVENOUS

## 2011-12-25 MED ORDER — HYDROMORPHONE HCL PF 1 MG/ML IJ SOLN: 0.5000 mg | INTRAMUSCULAR | Status: DC | PRN

## 2011-12-25 MED ORDER — MIDAZOLAM HCL 5 MG/5ML IJ SOLN
INTRAMUSCULAR | Status: DC | PRN
  Administered 2011-12-25: 2 mg via INTRAVENOUS

## 2011-12-25 MED ORDER — OXYCODONE-ACETAMINOPHEN 5-325 MG PO TABS: ORAL_TABLET | ORAL | Status: DC

## 2011-12-25 MED ORDER — FENTANYL CITRATE 0.05 MG/ML IJ SOLN: 50.0000 ug | Freq: Once | INTRAMUSCULAR | Status: DC

## 2011-12-25 MED ORDER — METHOCARBAMOL 100 MG/ML IJ SOLN
500.0000 mg | Freq: Four times a day (QID) | INTRAVENOUS | Status: DC | PRN
  Administered 2011-12-25 – 2011-12-26 (×2): 500 mg via INTRAVENOUS
  Filled 2011-12-25 (×2): qty 5

## 2011-12-25 MED ORDER — MEPERIDINE HCL 50 MG/ML IJ SOLN: 6.2500 mg | INTRAMUSCULAR | Status: DC | PRN

## 2011-12-25 MED ORDER — ACETAMINOPHEN 10 MG/ML IV SOLN
INTRAVENOUS | Status: DC | PRN
  Administered 2011-12-25: 1000 mg via INTRAVENOUS

## 2011-12-25 MED ORDER — HYDROMORPHONE HCL PF 1 MG/ML IJ SOLN
INTRAMUSCULAR | Status: AC
  Filled 2011-12-25: qty 1

## 2011-12-25 MED ORDER — NEOSTIGMINE METHYLSULFATE 1 MG/ML IJ SOLN
INTRAMUSCULAR | Status: DC | PRN
  Administered 2011-12-25: 4 mg via INTRAVENOUS

## 2011-12-25 MED ORDER — HYDROCODONE-ACETAMINOPHEN 5-325 MG PO TABS
1.0000 | ORAL_TABLET | ORAL | Status: DC | PRN
  Administered 2011-12-25 – 2011-12-26 (×3): 2 via ORAL
  Filled 2011-12-25 (×3): qty 2

## 2011-12-25 MED ORDER — LACTATED RINGERS IV SOLN
INTRAVENOUS | Status: DC | PRN
  Administered 2011-12-25: 16:00:00 via INTRAVENOUS

## 2011-12-25 MED ORDER — ROCURONIUM BROMIDE 100 MG/10ML IV SOLN
INTRAVENOUS | Status: DC | PRN
  Administered 2011-12-25: 30 mg via INTRAVENOUS

## 2011-12-25 MED ORDER — BUPIVACAINE HCL 0.5 % IJ SOLN
INTRAMUSCULAR | Status: DC | PRN
  Administered 2011-12-25: 6 mL

## 2011-12-25 MED ORDER — KETOROLAC TROMETHAMINE 15 MG/ML IJ SOLN
15.0000 mg | Freq: Four times a day (QID) | INTRAMUSCULAR | Status: DC
  Administered 2011-12-26 (×3): 15 mg via INTRAVENOUS
  Filled 2011-12-25 (×6): qty 1

## 2011-12-25 MED ORDER — CEFAZOLIN SODIUM-DEXTROSE 2-3 GM-% IV SOLR
2.0000 g | Freq: Four times a day (QID) | INTRAVENOUS | Status: AC
  Administered 2011-12-25 – 2011-12-26 (×3): 2 g via INTRAVENOUS
  Filled 2011-12-25 (×3): qty 50

## 2011-12-25 MED ORDER — CLINDAMYCIN PHOSPHATE 900 MG/50ML IV SOLN
900.0000 mg | Freq: Once | INTRAVENOUS | Status: AC
  Administered 2011-12-25: 900 mg via INTRAVENOUS

## 2011-12-25 MED ORDER — SUCCINYLCHOLINE CHLORIDE 20 MG/ML IJ SOLN
INTRAMUSCULAR | Status: DC | PRN
  Administered 2011-12-25: 100 mg via INTRAVENOUS

## 2011-12-25 MED ORDER — KETOROLAC TROMETHAMINE 30 MG/ML IJ SOLN
INTRAMUSCULAR | Status: DC | PRN
  Administered 2011-12-25: 30 mg via INTRAVENOUS

## 2011-12-25 MED ORDER — ONDANSETRON HCL 4 MG/2ML IJ SOLN
4.0000 mg | Freq: Once | INTRAMUSCULAR | Status: AC
  Administered 2011-12-25: 4 mg via INTRAVENOUS
  Filled 2011-12-25 (×2): qty 2

## 2011-12-25 MED ORDER — HYDROMORPHONE HCL PF 1 MG/ML IJ SOLN
1.0000 mg | Freq: Once | INTRAMUSCULAR | Status: AC
  Administered 2011-12-25: 1 mg via INTRAVENOUS
  Filled 2011-12-25: qty 1

## 2011-12-25 MED ORDER — METHOCARBAMOL 500 MG PO TABS
500.0000 mg | ORAL_TABLET | Freq: Four times a day (QID) | ORAL | Status: DC | PRN
  Administered 2011-12-26: 500 mg via ORAL
  Filled 2011-12-25: qty 1

## 2011-12-25 MED ORDER — ONDANSETRON HCL 4 MG PO TABS: 4.0000 mg | ORAL_TABLET | Freq: Four times a day (QID) | ORAL | Status: DC | PRN

## 2011-12-25 MED ORDER — LIDOCAINE HCL (CARDIAC) 20 MG/ML IV SOLN
INTRAVENOUS | Status: DC | PRN
  Administered 2011-12-25: 100 mg via INTRAVENOUS

## 2011-12-25 MED ORDER — MORPHINE SULFATE 4 MG/ML IJ SOLN
6.0000 mg | Freq: Once | INTRAMUSCULAR | Status: AC
  Administered 2011-12-25: 6 mg via INTRAVENOUS
  Filled 2011-12-25: qty 2

## 2011-12-25 MED ORDER — SODIUM CHLORIDE 0.9 % IV BOLUS (SEPSIS)
1000.0000 mL | Freq: Once | INTRAVENOUS | Status: AC
  Administered 2011-12-25: 1000 mL via INTRAVENOUS

## 2011-12-25 MED ORDER — DIPHENHYDRAMINE HCL 50 MG/ML IJ SOLN
25.0000 mg | Freq: Once | INTRAMUSCULAR | Status: AC
  Administered 2011-12-25: 12:00:00 via INTRAVENOUS

## 2011-12-25 MED ORDER — ACETAMINOPHEN 10 MG/ML IV SOLN: 1000.0000 mg | Freq: Once | INTRAVENOUS | Status: DC | PRN

## 2011-12-25 MED ORDER — IBUPROFEN 800 MG PO TABS: 800.0000 mg | ORAL_TABLET | Freq: Three times a day (TID) | ORAL | Status: DC

## 2011-12-25 MED ORDER — SCOPOLAMINE 1 MG/3DAYS TD PT72
MEDICATED_PATCH | TRANSDERMAL | Status: DC | PRN
  Administered 2011-12-25: 1 via TRANSDERMAL

## 2011-12-25 MED ORDER — PANTOPRAZOLE SODIUM 40 MG PO TBEC
40.0000 mg | DELAYED_RELEASE_TABLET | Freq: Every day | ORAL | Status: DC
  Administered 2011-12-25 – 2011-12-26 (×2): 40 mg via ORAL
  Filled 2011-12-25 (×2): qty 1

## 2011-12-25 MED ORDER — ESCITALOPRAM OXALATE 20 MG PO TABS
20.0000 mg | ORAL_TABLET | Freq: Every day | ORAL | Status: DC
  Administered 2011-12-25: 20 mg via ORAL
  Filled 2011-12-25 (×2): qty 1

## 2011-12-25 MED ORDER — ONDANSETRON HCL 4 MG/2ML IJ SOLN
INTRAMUSCULAR | Status: DC | PRN
  Administered 2011-12-25: 4 mg via INTRAVENOUS

## 2011-12-25 MED ORDER — ONDANSETRON HCL 4 MG/2ML IJ SOLN: 4.0000 mg | Freq: Four times a day (QID) | INTRAMUSCULAR | Status: DC | PRN

## 2011-12-25 MED ORDER — PROPOFOL 10 MG/ML IV BOLUS
0.5000 mg/kg | Freq: Once | INTRAVENOUS | Status: AC
  Administered 2011-12-25: 12:00:00 via INTRAVENOUS
  Filled 2011-12-25: qty 20

## 2011-12-25 MED ORDER — SENNA 8.6 MG PO TABS: 1.0000 | ORAL_TABLET | Freq: Every day | ORAL | Status: DC | PRN

## 2011-12-25 MED ORDER — FENTANYL CITRATE 0.05 MG/ML IJ SOLN
INTRAMUSCULAR | Status: DC | PRN
  Administered 2011-12-25: 50 ug via INTRAVENOUS
  Administered 2011-12-25: 100 ug via INTRAVENOUS
  Administered 2011-12-25 (×3): 50 ug via INTRAVENOUS

## 2011-12-25 MED ORDER — GLYCOPYRROLATE 0.2 MG/ML IJ SOLN
INTRAMUSCULAR | Status: DC | PRN
  Administered 2011-12-25: 0.6 mg via INTRAVENOUS

## 2011-12-25 MED ORDER — DIPHENHYDRAMINE HCL 50 MG/ML IJ SOLN
INTRAMUSCULAR | Status: AC
  Filled 2011-12-25: qty 1

## 2011-12-25 MED ORDER — HYDROMORPHONE HCL PF 1 MG/ML IJ SOLN
0.2500 mg | INTRAMUSCULAR | Status: DC | PRN
  Administered 2011-12-25 (×3): 0.5 mg via INTRAVENOUS

## 2011-12-25 MED ORDER — 0.9 % SODIUM CHLORIDE (POUR BTL) OPTIME: TOPICAL | Status: DC | PRN

## 2011-12-25 MED ORDER — DOCUSATE SODIUM 100 MG PO CAPS
100.0000 mg | ORAL_CAPSULE | Freq: Two times a day (BID) | ORAL | Status: DC
  Administered 2011-12-26: 100 mg via ORAL

## 2011-12-25 MED ORDER — PROPOFOL 10 MG/ML IV BOLUS
INTRAVENOUS | Status: DC | PRN
  Administered 2011-12-25: 150 mg via INTRAVENOUS

## 2011-12-25 MED ORDER — DEXAMETHASONE SODIUM PHOSPHATE 10 MG/ML IJ SOLN
INTRAMUSCULAR | Status: DC | PRN
  Administered 2011-12-25: 10 mg via INTRAVENOUS

## 2011-12-25 MED ORDER — KCL IN DEXTROSE-NACL 20-5-0.45 MEQ/L-%-% IV SOLN
INTRAVENOUS | Status: DC
  Filled 2011-12-25 (×2): qty 1000

## 2011-12-25 MED ORDER — ONDANSETRON HCL 4 MG/2ML IJ SOLN
4.0000 mg | Freq: Once | INTRAMUSCULAR | Status: AC
  Administered 2011-12-25: 4 mg via INTRAVENOUS
  Filled 2011-12-25: qty 2

## 2011-12-25 MED ORDER — SODIUM CHLORIDE 0.9 % IR SOLN
Status: DC | PRN
  Administered 2011-12-25: 17:00:00

## 2011-12-25 MED ORDER — FLEET ENEMA 7-19 GM/118ML RE ENEM: 1.0000 | ENEMA | Freq: Every day | RECTAL | Status: DC | PRN

## 2011-12-25 MED ORDER — OXYCODONE HCL 5 MG PO TABS
5.0000 mg | ORAL_TABLET | ORAL | Status: DC | PRN
  Administered 2011-12-26: 10 mg via ORAL
  Filled 2011-12-25: qty 2

## 2011-12-25 MED ORDER — PROMETHAZINE HCL 25 MG/ML IJ SOLN
6.2500 mg | INTRAMUSCULAR | Status: DC | PRN
  Administered 2011-12-25: 6.25 mg via INTRAVENOUS

## 2011-12-25 MED ORDER — ENOXAPARIN SODIUM 40 MG/0.4ML ~~LOC~~ SOLN
40.0000 mg | SUBCUTANEOUS | Status: DC
  Administered 2011-12-26: 40 mg via SUBCUTANEOUS
  Filled 2011-12-25 (×2): qty 0.4

## 2011-12-25 MED ORDER — HYDROMORPHONE HCL PF 1 MG/ML IJ SOLN
1.0000 mg | INTRAMUSCULAR | Status: DC | PRN
  Administered 2011-12-25 – 2011-12-26 (×6): 1 mg via INTRAVENOUS
  Filled 2011-12-25 (×6): qty 1

## 2011-12-25 MED ORDER — CEFAZOLIN SODIUM-DEXTROSE 2-3 GM-% IV SOLR
2.0000 g | INTRAVENOUS | Status: AC
  Administered 2011-12-25: 2 g via INTRAVENOUS

## 2011-12-25 MED ORDER — LACTATED RINGERS IV SOLN
INTRAVENOUS | Status: DC
  Administered 2011-12-25: 21:00:00 via INTRAVENOUS

## 2011-12-25 MED ORDER — HYDROMORPHONE HCL PF 1 MG/ML IJ SOLN
1.0000 mg | Freq: Once | INTRAMUSCULAR | Status: AC
  Administered 2011-12-25: 1 mg via INTRAVENOUS

## 2011-12-25 MED ORDER — HYDROMORPHONE HCL PF 2 MG/ML IJ SOLN
2.0000 mg | Freq: Once | INTRAMUSCULAR | Status: AC
  Administered 2011-12-25: 2 mg via INTRAVENOUS
  Filled 2011-12-25: qty 1

## 2011-12-25 SURGICAL SUPPLY — 59 items
BAG ZIPLOCK 12X15 (MISCELLANEOUS) IMPLANT
BANDAGE GAUZE ELAST BULKY 4 IN (GAUZE/BANDAGES/DRESSINGS) ×2 IMPLANT
BIT DRILL 2.5X2.75 QC CALB (BIT) ×4 IMPLANT
BIT DRILL 2.9 CANN QC NONSTRL (BIT) ×2 IMPLANT
BIT DRILL CALIBRATED 2.7 (BIT) ×2 IMPLANT
CHLORAPREP W/TINT 26ML (MISCELLANEOUS) IMPLANT
CLOTH BEACON ORANGE TIMEOUT ST (SAFETY) ×2 IMPLANT
CUFF TOURN SGL QUICK 34 (TOURNIQUET CUFF)
CUFF TRNQT CYL 34X4X40X1 (TOURNIQUET CUFF) IMPLANT
DRAPE C-ARM 42X72 X-RAY (DRAPES) ×2 IMPLANT
DRAPE POUCH INSTRU U-SHP 10X18 (DRAPES) ×2 IMPLANT
DRAPE U-SHAPE 47X51 STRL (DRAPES) IMPLANT
DRSG ADAPTIC 3X8 NADH LF (GAUZE/BANDAGES/DRESSINGS) ×2 IMPLANT
DRSG PAD ABDOMINAL 8X10 ST (GAUZE/BANDAGES/DRESSINGS) ×2 IMPLANT
DURAPREP 26ML APPLICATOR (WOUND CARE) ×2 IMPLANT
ELECT REM PT RETURN 9FT ADLT (ELECTROSURGICAL) ×2
ELECTRODE REM PT RTRN 9FT ADLT (ELECTROSURGICAL) ×1 IMPLANT
GLOVE BIO SURGEON STRL SZ 6.5 (GLOVE) IMPLANT
GLOVE BIOGEL PI IND STRL 8 (GLOVE) ×1 IMPLANT
GLOVE BIOGEL PI INDICATOR 8 (GLOVE) ×1
GLOVE ECLIPSE 6.5 STRL STRAW (GLOVE) ×2 IMPLANT
GLOVE INDICATOR 6.5 STRL GRN (GLOVE) ×2 IMPLANT
GLOVE SURG SS PI 8.0 STRL IVOR (GLOVE) ×4 IMPLANT
GLOVE SURG SS PI 8.5 STRL IVOR (GLOVE) ×2
GLOVE SURG SS PI 8.5 STRL STRW (GLOVE) ×2 IMPLANT
GOWN PREVENTION PLUS LG XLONG (DISPOSABLE) ×2 IMPLANT
GOWN SRG XL XLNG 56XLVL 4 (GOWN DISPOSABLE) ×1 IMPLANT
GOWN STRL NON-REIN XL XLG LVL4 (GOWN DISPOSABLE) ×1
GOWN STRL REIN XL XLG (GOWN DISPOSABLE) IMPLANT
K-WIRE ACE 1.6X6 (WIRE) ×4
KIT BASIN OR (CUSTOM PROCEDURE TRAY) ×2 IMPLANT
KWIRE ACE 1.6X6 (WIRE) ×2 IMPLANT
MANIFOLD NEPTUNE II (INSTRUMENTS) IMPLANT
NEEDLE HYPO 22GX1.5 SAFETY (NEEDLE) ×2 IMPLANT
PACK LOWER EXTREMITY WL (CUSTOM PROCEDURE TRAY) ×2 IMPLANT
PAD CAST 4YDX4 CTTN HI CHSV (CAST SUPPLIES) ×1 IMPLANT
PADDING CAST COTTON 4X4 STRL (CAST SUPPLIES) ×1
PADDING CAST SYN 6 (CAST SUPPLIES) ×1
PADDING CAST SYNTHETIC 6X4 NS (CAST SUPPLIES) ×1 IMPLANT
PLATE LOCK 8H 103 BILAT FIB (Plate) ×2 IMPLANT
POSITIONER SURGICAL ARM (MISCELLANEOUS) ×2 IMPLANT
SCOTCHCAST PLUS 4X4 WHITE (CAST SUPPLIES) ×2 IMPLANT
SCREW ACE CAN 4.0 44M (Screw) ×4 IMPLANT
SCREW LOCK 3.5X14 DIST TIB (Screw) ×2 IMPLANT
SCREW LOCK CANC STAR 4X16 (Screw) ×2 IMPLANT
SCREW NON LOCKING LP 3.5 14MM (Screw) ×2 IMPLANT
SCREW NON LOCKING LP 3.5 16MM (Screw) ×4 IMPLANT
SPONGE GAUZE 4X4 12PLY (GAUZE/BANDAGES/DRESSINGS) ×2 IMPLANT
SPONGE LAP 4X18 X RAY DECT (DISPOSABLE) ×4 IMPLANT
STAPLER VISISTAT (STAPLE) ×2 IMPLANT
SUCTION FRAZIER 12FR DISP (SUCTIONS) ×2 IMPLANT
SUT ETHILON 4 0 PS 2 18 (SUTURE) ×4 IMPLANT
SUT VIC AB 1 CT1 27 (SUTURE) ×2
SUT VIC AB 1 CT1 27XBRD ANTBC (SUTURE) ×2 IMPLANT
SUT VIC AB 2-0 CT1 27 (SUTURE) ×1
SUT VIC AB 2-0 CT1 TAPERPNT 27 (SUTURE) ×1 IMPLANT
SUT VIC AB 2-0 CT2 27 (SUTURE) ×2 IMPLANT
SYR CONTROL 10ML LL (SYRINGE) ×2 IMPLANT
TRAY FOLEY CATH 14FRSI W/METER (CATHETERS) ×2 IMPLANT

## 2011-12-25 NOTE — ED Notes (Signed)
Report received, assumed care.  

## 2011-12-25 NOTE — ED Notes (Signed)
Pt splinted by Ortho and PA in room Dr Rubin Payor at bedside.  Pt wakes to stimili, and feel pains.

## 2011-12-25 NOTE — Anesthesia Preprocedure Evaluation (Addendum)
Anesthesia Evaluation  Patient identified by MRN, date of birth, ID band Patient awake    Reviewed: Allergy & Precautions, H&P , NPO status , Patient's Chart, lab work & pertinent test results  Airway Mallampati: I TM Distance: >3 FB Neck ROM: Full    Dental  (+) Teeth Intact and Dental Advisory Given   Pulmonary neg pulmonary ROS,  breath sounds clear to auscultation  Pulmonary exam normal       Cardiovascular - CAD and - Past MI Rhythm:Regular Rate:Normal     Neuro/Psych  Headaches, negative psych ROS   GI/Hepatic negative GI ROS, Neg liver ROS,   Endo/Other  negative endocrine ROS  Renal/GU      Musculoskeletal negative musculoskeletal ROS (+)   Abdominal (+) + obese,   Peds  Hematology negative hematology ROS (+)   Anesthesia Other Findings   Reproductive/Obstetrics                         Anesthesia Physical Anesthesia Plan  ASA: II  Anesthesia Plan: General   Post-op Pain Management:    Induction: Intravenous  Airway Management Planned: Oral ETT  Additional Equipment:   Intra-op Plan:   Post-operative Plan: Extubation in OR  Informed Consent: I have reviewed the patients History and Physical, chart, labs and discussed the procedure including the risks, benefits and alternatives for the proposed anesthesia with the patient or authorized representative who has indicated his/her understanding and acceptance.   Dental advisory given  Plan Discussed with: CRNA  Anesthesia Plan Comments:         Anesthesia Quick Evaluation

## 2011-12-25 NOTE — Sedation Documentation (Signed)
Medication dose calculated and verified for: with Mathis Dad, PA

## 2011-12-25 NOTE — ED Notes (Signed)
Patient transported to X-ray 

## 2011-12-25 NOTE — ED Notes (Signed)
Pt stepped out of bed and ankle turned, positive deformity and swollen

## 2011-12-25 NOTE — Brief Op Note (Signed)
12/25/2011  6:04 PM  PATIENT:  [name]  31 y.o. female  PRE-OPERATIVE DIAGNOSIS:  fractured left ankle  POST-OPERATIVE DIAGNOSIS:  fractured left ankle  PROCEDURE:  Procedure(s) (LRB) with comments: OPEN REDUCTION INTERNAL FIXATION (ORIF) ANKLE FRACTURE (Left) CAST APPLICATION (Left)  SURGEON:  Surgeon(s) and Role:    * Javier Docker, MD - Primary  PHYSICIAN ASSISTANT:   ASSISTANTS: none   ANESTHESIA:   general  EBL:  Total I/O In: 2000 [I.V.:2000] Out: 675 [Urine:650; Blood:25]  BLOOD ADMINISTERED:none  DRAINS: none   LOCAL MEDICATIONS USED:  MARCAINE     SPECIMEN:  No Specimen  DISPOSITION OF SPECIMEN:  N/A  COUNTS:  YES  TOURNIQUET:  * No tourniquets in log *  DICTATION: .Other Dictation: Dictation Number       [id number]  PLAN OF CARE: Admit for overnight observation  PATIENT DISPOSITION:  PACU - hemodynamically stable.   Delay start of Pharmacological VTE agent (>24hrs) due to surgical blood loss or risk of bleeding: no

## 2011-12-25 NOTE — H&P (Signed)
[name] is an 31 y.o. female.   Chief Complaint: left ankle pain HPI: Larey Seat at home out of bed. Reduced ankle came to ER  Past Medical History  Diagnosis Date  . Migraine   . Kidney stone   . Kidney stones   . Gallstones   . Kidney stone     Past Surgical History  Procedure Date  . Abdominal hysterectomy   . Tonsillectomy   . Lithotripsy   . Cholecystectomy 03/11/2011    Procedure: LAPAROSCOPIC CHOLECYSTECTOMY WITH INTRAOPERATIVE CHOLANGIOGRAM;  Surgeon: Atilano Ina, MD;  Location: [facility] OR;  Service: General;  Laterality: N/A;  . Cholecystectomy, laparoscopic     Family History  Problem Relation Age of Onset  . Diabetes Mother   . Diabetes Father   . Heart failure Father   . Hypertension Father   . Pulmonary fibrosis Father    Social History:  reports that she has quit smoking. Her smoking use included Cigarettes. She smoked .5 packs per day. She does not have any smokeless tobacco history on file. She reports that she drinks alcohol. She reports that she does not use illicit drugs.  Allergies:  Allergies  Allergen Reactions  . Compazine Other (See Comments)    dystonia  . Metoclopramide Hcl Other (See Comments)    dystonia     (Not in a hospital admission)  No results found for this or any previous visit (from the past 48 hour(s)). Dg Ankle Complete Left  12/25/2011  *RADIOLOGY REPORT*  Clinical Data: Twisting injury.  LEFT ANKLE COMPLETE - 3+ VIEW  Comparison: None.  Findings: Oblique fracture of the distal left fibula with separation of fracture fragments.  This may be comminuted.  Transverse/oblique fracture of the medial malleolus.  Fracture of the posterior inferior aspect of the tibia.  Interruption of the ankle mortise with the distal tibia displaced medially with regard to the talar dome.  Avulsion fracture of the posterior inferior aspect of the cuboid.  IMPRESSION: Trimalleolar fracture with interruption of the ankle mortise.  Small avulsion fracture  of the cuboid.   Original Report Authenticated By: Fuller Canada, M.D.     Review of Systems  Musculoskeletal: Positive for joint pain.  All other systems reviewed and are negative.    Blood pressure 112/72, pulse 79, temperature 98.2 F (36.8 C), temperature source Oral, resp. rate 14, weight 90.7 kg (199 lb 15.3 oz), SpO2 98.00%. Physical Exam  Constitutional: She is oriented to person, place, and time. She appears well-developed.  HENT:  Head: Normocephalic.  Eyes: Pupils are equal, round, and reactive to light.  Neck: Normal range of motion.  Cardiovascular: Normal rate.   Respiratory: Effort normal.  GI: Soft.  Musculoskeletal:       Splint intact. Good capillary refill. Moving toes.  Neurological: She is alert and oriented to person, place, and time.  Skin: Skin is warm and dry.  Psychiatric: She has a normal mood and affect.     Assessment/Plan Left displaced Trimalleolar ankle fracture. Plan ORIF. Admit. Lives alone. Risks discussed.  Deshon Koslowski C 12/25/2011, 12:59 PM

## 2011-12-25 NOTE — ED Provider Notes (Signed)
History     CSN: [id number]  Arrival date & time 12/25/11  4098   First MD Initiated Contact with Patient 12/25/11 0848      Chief Complaint  Patient presents with  . Ankle Pain    (Consider location/radiation/quality/duration/timing/severity/associated sxs/prior treatment) HPI  [name] is a 31 y.o. female complaining of left ankle pain after dislocating it and reducing it herself this a.m. Patient has a history of nonpathologic joint hyper mobility. She stepped out of bed this a.m. and when she looked down she saw her foot laterally displaced. Patient reduced the dislocation herself. Patient denies numbness or paresthesia. Pain is 10 out of 10. No prior injuries to this ankle.  Past Medical History  Diagnosis Date  . Migraine   . Kidney stone   . Kidney stones   . Gallstones   . Kidney stone     Past Surgical History  Procedure Date  . Abdominal hysterectomy   . Tonsillectomy   . Lithotripsy   . Cholecystectomy 03/11/2011    Procedure: LAPAROSCOPIC CHOLECYSTECTOMY WITH INTRAOPERATIVE CHOLANGIOGRAM;  Surgeon: Atilano Ina, MD;  Location: [facility] OR;  Service: General;  Laterality: N/A;  . Cholecystectomy, laparoscopic     Family History  Problem Relation Age of Onset  . Diabetes Mother   . Diabetes Father   . Heart failure Father   . Hypertension Father   . Pulmonary fibrosis Father     History  Substance Use Topics  . Smoking status: Former Smoker -- 0.5 packs/day    Types: Cigarettes  . Smokeless tobacco: Not on file  . Alcohol Use: Yes     occaisional    OB History    Grav Para Term Preterm Abortions TAB SAB Ect Mult Living                  Review of Systems  Constitutional: Negative for fever.  Respiratory: Negative for shortness of breath.   Cardiovascular: Negative for chest pain.  Gastrointestinal: Negative for nausea, vomiting, abdominal pain and diarrhea.  Musculoskeletal: Positive for arthralgias.  Neurological: Negative for numbness.   All other systems reviewed and are negative.    Allergies  Compazine and Metoclopramide hcl  Home Medications   Current Outpatient Rx  Name Route Sig Dispense Refill  . VITAMIN D 1000 UNITS PO TABS Oral Take 1,000 Units by mouth daily.    Marland Kitchen ESCITALOPRAM OXALATE 20 MG PO TABS Oral Take 20 mg by mouth at bedtime.     Marland Kitchen NAPROXEN SODIUM 220 MG PO TABS Oral Take 440 mg by mouth 2 (two) times daily as needed. For pain    . OMEPRAZOLE 20 MG PO CPDR Oral Take 20 mg by mouth at bedtime.       BP 112/72  Pulse 79  Temp 98.2 F (36.8 C) (Oral)  Resp 14  Wt 199 lb 15.3 oz (90.7 kg)  SpO2 98%  Physical Exam  Nursing note and vitals reviewed. Constitutional: She is oriented to person, place, and time. She appears well-developed and well-nourished. No distress.  HENT:  Head: Normocephalic.  Eyes: Conjunctivae normal and EOM are normal.  Cardiovascular: Normal rate.   Pulmonary/Chest: Effort normal. No stridor.  Musculoskeletal: Normal range of motion.       Psoas pedis 2+ bilaterally. No deformity. Significant swelling to medial malleolus. Partial-thickness abrasion approximately 2 x 2 centimeters to medial malleolus. Reduced range of motion to all toes of the left foot. Distal sensation is grossly intact with without pallor, cap  refill is less than 2 seconds x5 digits  Neurological: She is alert and oriented to person, place, and time.  Psychiatric: She has a normal mood and affect.    ED Course  Reduction of fracture Date/Time: 12/25/2011 12:43 PM Performed by: Wynetta Emery Authorized by: Wynetta Emery Consent: Verbal consent obtained. Written consent not obtained. Risks and benefits: risks, benefits and alternatives were discussed Consent given by: patient Patient understanding: patient states understanding of the procedure being performed Patient consent: the patient's understanding of the procedure matches consent given Procedure consent: procedure consent matches  procedure scheduled Relevant documents: relevant documents present and verified Test results: test results available and properly labeled Site marked: the operative site was marked Imaging studies: imaging studies available Patient identity confirmed: arm band Time out: Immediately prior to procedure a "time out" was called to verify the correct patient, procedure, equipment, support staff and site/side marked as required. Preparation: Patient was prepped and draped in the usual sterile fashion. Local anesthesia used: no Patient sedated: yes Sedatives: propofol Analgesia: hydromorphone Vitals: Vital signs were monitored during sedation. Patient tolerance: Patient tolerated the procedure well with no immediate complications. Comments: Displaced lateral malleolar fracture reduced and splint applied   (including critical care time)  Labs Reviewed - No data to display Dg Ankle Complete Left  12/25/2011  *RADIOLOGY REPORT*  Clinical Data: Twisting injury.  LEFT ANKLE COMPLETE - 3+ VIEW  Comparison: None.  Findings: Oblique fracture of the distal left fibula with separation of fracture fragments.  This may be comminuted.  Transverse/oblique fracture of the medial malleolus.  Fracture of the posterior inferior aspect of the tibia.  Interruption of the ankle mortise with the distal tibia displaced medially with regard to the talar dome.  Avulsion fracture of the posterior inferior aspect of the cuboid.  IMPRESSION: Trimalleolar fracture with interruption of the ankle mortise.  Small avulsion fracture of the cuboid.   Original Report Authenticated By: Fuller Canada, M.D.      1. Closed trimalleolar fracture of left ankle       MDM  Trimalleolar fracture minimally displaced.  Consult from orthopedist Dr. Shelle Iron appreciated: He recommends reduction of the lateral fragment.  Conscious sedation with propofol by Dr. Rubin Payor. Reduction accomplished and splint placed. Post reduction films  pending.  Dr. Shelle Iron has decided that she is a good surgical candidate and will take her to the OR for ORIF.           Wynetta Emery, PA-C 12/25/11 1304

## 2011-12-25 NOTE — ED Notes (Signed)
MD at bedside.  Ortho at bediside

## 2011-12-25 NOTE — Progress Notes (Signed)
Orthopedic Tech Progress Note Patient Details:  [name] 03/28/1980 [id number]  Ortho Devices Type of Ortho Device: Stirrup splint;Post (short) splint Splint Material: Fiberglass Ortho Device/Splint Location: LEFT POSTERIOR WITH STIRRUP SPLINT  Ortho Device/Splint Interventions: Application   Cammer, Mickie Bail 12/25/2011, 1:24 PM

## 2011-12-25 NOTE — ED Provider Notes (Signed)
Medical screening examination/treatment/procedure(s) were conducted as a shared visit with non-physician practitioner(s) and myself.  I personally evaluated the patient during the encounter. Procedural sedation Performed by: Billee Cashing Consent: Verbal consent obtained. Risks and benefits: risks, benefits and alternatives were discussed Required items: required blood products, implants, devices, and special equipment available Patient identity confirmed: arm band and provided demographic data Time out: Immediately prior to procedure a "time out" was called to verify the correct patient, procedure, equipment, support staff and site/side marked as required.  Sedation type: moderate (conscious) sedation NPO time confirmed and considedered  Sedatives: PROPOFOL  Physician Time at Bedside: 10 minutes   Vitals: Vital signs were monitored during sedation. Cardiac Monitor, pulse oximeter Patient tolerance: Patient tolerated the procedure well with no immediate complications. Comments: Pt with uneventful recovered. Returned to pre-procedural sedation baseline    Juliet Rude. Rubin Payor, MD 12/25/11 [phone]

## 2011-12-25 NOTE — ED Notes (Signed)
Spoke to OR at [facility], instructed to have pt there by approx 1500 & that they will give pre op IV antibiotics there.

## 2011-12-25 NOTE — Preoperative (Signed)
Beta Blockers   Reason not to administer Beta Blockers:Not Applicable 

## 2011-12-25 NOTE — Transfer of Care (Signed)
Immediate Anesthesia Transfer of Care Note  Patient: [name]  Procedure(s) Performed: Procedure(s) (LRB): OPEN REDUCTION INTERNAL FIXATION (ORIF) ANKLE FRACTURE (Left) CAST APPLICATION (Left)  Patient Location: PACU  Anesthesia Type: General  Level of Consciousness: sedated, patient cooperative and responds to stimulaton  Airway & Oxygen Therapy: Patient Spontanous Breathing and Patient connected to face mask oxgen  Post-op Assessment: Report given to PACU RN and Post -op Vital signs reviewed and stable  Post vital signs: Reviewed and stable  Complications: No apparent anesthesia complications

## 2011-12-26 MED ORDER — KETOROLAC TROMETHAMINE 10 MG PO TABS: 10.0000 mg | ORAL_TABLET | Freq: Four times a day (QID) | ORAL | Status: DC | PRN

## 2011-12-26 MED ORDER — METHOCARBAMOL 500 MG PO TABS: 500.0000 mg | ORAL_TABLET | Freq: Four times a day (QID) | ORAL | Status: DC | PRN

## 2011-12-26 MED ORDER — OXYCODONE-ACETAMINOPHEN 5-325 MG PO TABS: ORAL_TABLET | ORAL | Status: DC

## 2011-12-26 NOTE — Care Management Note (Signed)
    Page 1 of 2   12/26/2011     5:19:26 PM   CARE MANAGEMENT NOTE 12/26/2011  Patient:  Cerros,Jamell   Account Number:  [id number]  Date Initiated:  12/26/2011  Documentation initiated by:  Colleen Can  Subjective/Objective Assessment:   DX FRACTURE OF ANKLE;ORIF  WILL NEED RW. NO HH SERVICES ORDERED     Action/Plan:   HOME UPON DISCHARGE   Anticipated DC Date:  12/26/2011   Anticipated DC Plan:  HOME/SELF CARE  In-house referral  NA      DC Planning Services  CM consult      PAC Choice  DURABLE MEDICAL EQUIPMENT   Choice offered to / List presented to:  NA   DME arranged  [facility] Lavone Nian      DME agency  [facility]     HH arranged  NA      HH agency  NA   Status of service:  Completed, signed off Medicare Important Message given?  NO (If response is "NO", the following Medicare IM given date fields will be blank) Date Medicare IM given:   Date Additional Medicare IM given:    Discharge Disposition:  HOME/SELF CARE  Per UR Regulation:    If discussed at Long Length of Stay Meetings, dates discussed:    Comments:

## 2011-12-26 NOTE — Progress Notes (Signed)
Utilization review completed.  

## 2011-12-26 NOTE — Consult Note (Signed)
Patient ID: [name] MRN: [id number] DOB/AGE: 33-May-1982 33 y.o.  Admit date: 12/25/2011 Discharge date: 12/26/2011  Admission Diagnoses: rotator cuff tear Active Problems:  * No active hospital problems. *    Discharge Diagnoses:  Same  Past Medical History  Diagnosis Date  . Migraine   . Kidney stone   . Kidney stones   . Gallstones   . Kidney stone     Surgeries: Procedure(s): OPEN REDUCTION INTERNAL FIXATION (ORIF) ANKLE FRACTURE CAST APPLICATION on 12/25/2011   Consultants:    Discharged Condition: Improved  Hospital Course: [name] is an 31 y.o. female who was admitted 12/25/2011 for operative treatment of<principal problem not specified>. Patient has severe unremitting pain that affects sleep, daily activities, and work/hobbies. After pre-op clearance the patient was taken to the operating room on 12/25/2011 and underwent  Procedure(s): OPEN REDUCTION INTERNAL FIXATION (ORIF) ANKLE FRACTURE CAST APPLICATION.    Patient was given perioperative antibiotics: Anti-infectives     Start     Dose/Rate Route Frequency Ordered Stop   12/25/11 2200   ceFAZolin (ANCEF) IVPB 2 g/50 mL premix        2 g 100 mL/hr over 30 Minutes Intravenous Every 6 hours 12/25/11 1950 12/26/11 1559   12/25/11 1707   polymyxin B 500,000 Units, bacitracin 50,000 Units in sodium chloride irrigation 0.9 % 500 mL irrigation  Status:  Discontinued          As needed 12/25/11 1708 12/25/11 1807   12/25/11 1530   clindamycin (CLEOCIN) IVPB 900 mg        900 mg 100 mL/hr over 30 Minutes Intravenous  Once 12/25/11 1522 12/25/11 1617   12/25/11 1522   ceFAZolin (ANCEF) IVPB 2 g/50 mL premix        2 g 100 mL/hr over 30 Minutes Intravenous 60 min pre-op 12/25/11 1522 12/25/11 1602           Patient was given sequential compression devices, early ambulation, and chemoprophylaxis to prevent DVT.  Patient benefited maximally from hospital stay and there were no complications.      Recent vital signs: Patient Vitals for the past 24 hrs:  BP Temp Temp src Pulse Resp SpO2 Height Weight  12/26/11 0530 94/55 mmHg 98.6 F (37 C) Oral 63  16  95 % - -  12/26/11 0151 103/70 mmHg 98.7 F (37.1 C) Oral 80  16  97 % - -  12/25/11 2245 106/71 mmHg 98.9 F (37.2 C) Oral 89  16  96 % - -  12/25/11 2145 98/61 mmHg 98.5 F (36.9 C) Oral 99  16  97 % - -  12/25/11 2045 116/74 mmHg 98.7 F (37.1 C) Oral 107  16  97 % - -  12/25/11 1945 109/73 mmHg 99.4 F (37.4 C) - 90  16  97 % - -  12/25/11 1915 110/60 mmHg 98.9 F (37.2 C) - 95  19  100 % - -  12/25/11 1900 110/56 mmHg - - 95  15  100 % - -  12/25/11 1845 117/59 mmHg - - 96  19  100 % - -  12/25/11 1830 116/61 mmHg - - 103  19  100 % - -  12/25/11 1813 122/67 mmHg 97.7 F (36.5 C) - 122  18  100 % - -  12/25/11 1426 - - - - - - 5\' 7"  (1.702 m) 95.255 kg (210 lb)  12/25/11 1415 98/66 mmHg 98 F (36.7 C) Oral 74  15  97 % - -  12/25/11 1300 103/63 mmHg - - 81  15  97 % - -  12/25/11 1240 112/72 mmHg - - 79  14  98 % - -  12/25/11 1237 109/83 mmHg - - 84  14  95 % - -  12/25/11 1235 - - - - - 99 % - -  12/25/11 1235 109/53 mmHg - - 88  24  95 % - -  12/25/11 1231 118/64 mmHg - - 84  16  100 % - -  12/25/11 1230 119/64 mmHg - - 80  15  100 % - -  12/25/11 1225 118/71 mmHg - - 87  14  100 % - -  12/25/11 1220 117/57 mmHg - - 85  15  100 % - -  12/25/11 1217 108/75 mmHg - - 111  16  100 % - -     Recent laboratory studies: No results found for this basename: WBC:2,HGB:2,HCT:2,PLT:2,NA:2,K:2,CL:2,CO2:2,BUN:2,CREATININE:2,GLUCOSE:2,PT:2,INR:2,CALCIUM,2: in the last 72 hours   Discharge Medications:     Medication List     As of 12/26/2011  9:35 AM    STOP taking these medications         naproxen sodium 220 MG tablet   Commonly known as: ANAPROX      TAKE these medications         cholecalciferol 1000 UNITS tablet   Commonly known as: VITAMIN D   Take 1,000 Units by mouth daily.      escitalopram 20 MG  tablet   Commonly known as: LEXAPRO   Take 20 mg by mouth at bedtime.      ibuprofen 800 MG tablet   Commonly known as: ADVIL,MOTRIN   Take 1 tablet (800 mg total) by mouth 3 (three) times daily.      ketorolac 10 MG tablet   Commonly known as: TORADOL   Take 1 tablet (10 mg total) by mouth every 6 (six) hours as needed for pain.      ketorolac 10 MG tablet   Commonly known as: TORADOL   Take 1 tablet (10 mg total) by mouth every 6 (six) hours as needed for pain.      ketorolac 10 MG tablet   Commonly known as: TORADOL   Take 1 tablet (10 mg total) by mouth every 6 (six) hours as needed for pain.      methocarbamol 500 MG tablet   Commonly known as: ROBAXIN   Take 1 tablet (500 mg total) by mouth every 6 (six) hours as needed.      omeprazole 20 MG capsule   Commonly known as: PRILOSEC   Take 20 mg by mouth at bedtime.      oxyCODONE-acetaminophen 5-325 MG per tablet   Commonly known as: PERCOCET/ROXICET   1 to 2 tabs PO q6hrs  PRN for pain        Diagnostic Studies: Ct Abdomen Pelvis Wo Contrast  12/17/2011  *RADIOLOGY REPORT*  Clinical Data: Right flank pain  CT ABDOMEN AND PELVIS WITHOUT CONTRAST  Technique:  Multidetector CT imaging of the abdomen and pelvis was performed following the standard protocol without intravenous contrast.  Comparison: 12/06/2011  Findings: Limited images through the lung bases demonstrate no significant appreciable abnormality. The heart size is within normal limits. No pleural or pericardial effusion.  Organ abnormality/lesion detection is limited in the absence of intravenous contrast. Within this limitation, unremarkable liver, spleen, pancreas, adrenal glands.  Cholecystectomy clips.  No biliary ductal dilatation. Nonobstructing bilateral renal stones.  No hydronephrosis or hydroureter.  No bowel obstruction.  Normal appendix.  No CT evidence for colitis.  No free intraperitoneal air or fluid.  No lymphadenopathy.  Normal caliber vasculature.   Decompressed bladder.  Uterus not visualized.  No adnexal mass.  No acute osseous finding.  IMPRESSION: Bilateral nonobstructing renal stones.  No hydronephrosis or ureteral calculi identified.   Original Report Authenticated By: Waneta Martins, M.D.    Ct Abdomen Pelvis Wo Contrast  12/06/2011  *RADIOLOGY REPORT*  Clinical Data: 31 year old female with left flank pain radiating to the left lower quadrant.  CT ABDOMEN AND PELVIS WITHOUT CONTRAST  Technique:  Multidetector CT imaging of the abdomen and pelvis was performed following the standard protocol without intravenous contrast.  Comparison: 08/04/2011 and earlier.  Findings: Lung bases are clear. No acute osseous abnormality identified.  Uterus surgically absent.  Adnexa stable and within normal limits. No pelvic free fluid.  Negative distal colon.  Bladder unremarkable.  Normal proximal colon and appendix.  No dilated small bowel. Stomach and duodenum within normal limits.  Gallbladder surgically absent.  Negative noncontrast liver, spleen, pancreas, and adrenal glands.  Nonobstructing left mid pole calculus measures 4 mm.  Punctate left lower pole nephrolithiasis also.  No left hydronephrosis or perinephric stranding.  No left hydroureter or periureteral stranding.  Normal course of the left ureter.  Punctate right mid pole nephrolithiasis.  Small lower pole cortical cyst again noted.  No right hydronephrosis or perinephric stranding.  No right hydroureter.  Normal course of the right ureter.  No abdominal free fluid.  IMPRESSION: 1.  No obstructive uropathy.  Bilateral nonobstructing nephrolithiasis. 2.  Normal appendix.  No acute findings identified.   Original Report Authenticated By: Harley Hallmark, M.D.    Dg Ankle Complete Left  12/25/2011  *RADIOLOGY REPORT*  Clinical Data: ORIF  LEFT ANKLE COMPLETE - 3+ VIEW  Comparison: Same day  Findings: Four C-arm images show ORIF of a trimalleolar fracture. The fibula is treated with a lateral plate  and screws.  The medial malleolus is treated with two screws.  Posterior tibial fracture does not show hardware reduction, but appears in near anatomic position.  IMPRESSION: ORIF of trimalleolar fracture as described.   Original Report Authenticated By: Thomasenia Sales, M.D.    Dg Ankle Complete Left  12/25/2011  *RADIOLOGY REPORT*  Clinical Data: Post reduction  LEFT ANKLE COMPLETE - 3+ VIEW  Comparison: Same day  Findings: Pre views through a cast show improved position and alignment in the region of the ankle with transverse fracture of the medial malleolus, oblique fracture of the distal fibula and fracture of the posterior lip of the tibia.  IMPRESSION: Improved alignment following closed reduction.  Cast in place.   Original Report Authenticated By: Thomasenia Sales, M.D.    Dg Ankle Complete Left  12/25/2011  *RADIOLOGY REPORT*  Clinical Data: Twisting injury.  LEFT ANKLE COMPLETE - 3+ VIEW  Comparison: None.  Findings: Oblique fracture of the distal left fibula with separation of fracture fragments.  This may be comminuted.  Transverse/oblique fracture of the medial malleolus.  Fracture of the posterior inferior aspect of the tibia.  Interruption of the ankle mortise with the distal tibia displaced medially with regard to the talar dome.  Avulsion fracture of the posterior inferior aspect of the cuboid.  IMPRESSION: Trimalleolar fracture with interruption of the ankle mortise.  Small avulsion fracture of the cuboid.   Original Report Authenticated By: Fuller Canada, M.D.    Dg C-arm 1-60 Min-no Report  12/25/2011  CLINICAL DATA: fractured left ankle   C-ARM 1-60 MINUTES  Fluoroscopy was utilized by the requesting physician.  No radiographic  interpretation.      Disposition: 01-Home or Self Care        Follow-up Information    Follow up with Nekhi Liwanag C, MD. In 2 weeks.   Contact information:   [facility] [address] [place]  [address] [phone]           Signed: Javier Docker 12/26/2011, 9:35 AM

## 2011-12-26 NOTE — Anesthesia Postprocedure Evaluation (Signed)
Anesthesia Post Note  Patient: [name]  Procedure(s) Performed: Procedure(s) (LRB): OPEN REDUCTION INTERNAL FIXATION (ORIF) ANKLE FRACTURE (Left) CAST APPLICATION (Left)  Anesthesia type: General  Patient location: PACU  Post pain: Pain level controlled  Post assessment: Post-op Vital signs reviewed  Last Vitals: BP 103/70  Pulse 80  Temp 37.1 C (Oral)  Resp 16  Ht 5\' 7"  (1.702 m)  Wt 210 lb (95.255 kg)  BMI 32.89 kg/m2  SpO2 97%  Post vital signs: Reviewed  Level of consciousness: sedated  Complications: No apparent anesthesia complications

## 2011-12-26 NOTE — Plan of Care (Signed)
Problem: Diagnosis - Type of Surgery Goal: General Surgical Patient Education (See Patient Education module for education specifics)  Outcome: Progressing S/p ORIF Left Trimalleolar ankle fracture  Problem: Phase I Progression Outcomes Goal: Voiding-avoid urinary catheter unless indicated Outcome: Progressing Orders to D/C foley on POD#1 after 1st PT session.

## 2011-12-26 NOTE — Op Note (Signed)
[name], [name]            ACCOUNT NO.:  [id number]  MEDICAL RECORD NO.:  [id number]  LOCATION:  1602                         FACILITY:  [facility]  PHYSICIAN:  Jene Every, M.D.    DATE OF BIRTH:  June 16, 1980  DATE OF PROCEDURE:  12/25/2011 DATE OF DISCHARGE:                              OPERATIVE REPORT   PREOPERATIVE DIAGNOSIS:  Trimalleolar left ankle fracture.  POSTOPERATIVE DIAGNOSIS:  Trimalleolar left ankle fracture.  PROCEDURES PERFORMED: 1. Open reduction and internal fixation of left trimalleolar ankle     fracture. 2. Stress radiographs under anesthesia. 3. Application of short-leg cast.  ANESTHESIA:  General.  ASSISTANT:  None.  HISTORY:  A 31 year old, fracture and dislocation of the ankle, reduced in the emergency room.  Placed in a sling, indicated for open reduction and internal fixation.  Risks and benefits were discussed including bleeding, infection, damage to neurovascular structure, DVT, PE, anesthetic complications, etc.  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  The left lower extremity was prepped and draped in usual sterile fashion.  Attention was turned towards the fibula, incision over the lateral aspect of the fibula. Subcutaneous tissue was dissected.  Electrocautery was utilized to achieve hemostasis.  Fracture site was identified, it was fairly comminuted with an anterior portion and the posterior split portion. Multiple configurations, we were able to perform a reduction and we used a Biomet Low-Profile titanium plate contoured to the distal fibula.  It required placement anteriorly for coverage for reduction.  This wondered portion posteriorly, it was left displaced.  There was no anterior or posterior interfragmentary components that were amenable to that procedure.  We reduced the fracture and again  contoured the plate, secured it proximally with three fully-threaded cortical screws and distally with two locking screws at the appropriate drilling depth gauge measurement, insertion of the appropriate length screws.  An excellent reduction was noted in the AP and lateral plane with restoration of the fibula.  We then turned our attention towards the medial malleolus.  We made a small curvilinear incision over the medial malleolus.  There was an abrasion anteriorly, no evidence of open fracture.  I carried our dissection down to the periosteum, which was displaced into the fracture site, this was irrigated.  Periosteum removed.  Fracture clamp and fracture hematoma expressed.  Placed two cannulated screws parallel after insertion of K-wires, overdrilling the proximal cortex, and insertion of partially-threaded 44-mm length screws with excellent purchase.  Reduction clamp was removed.  The stress radiographs under anesthesia revealed excellent reduction of the mortise, no widening of the mortise with eversion, supination and pronation.  There was no movement of the fibula with attempted manipulation.  Then, the posterior malleolar fracture was reduced.  Wound was copiously irrigated.  Again, we had to take care throughout the case to protect the branches of the peroneal nerve.  Following this on copious irrigation, I closed both with subcu 2-0 Vicryl simple sutures and skin was reapproximated with staples.  The periosteum was closed over the fractures as well.  I then placed her in a short-leg cast,  neutral, well-molded.  Again, in the AP and lateral plane, satisfactory placement was noted.  The patient was then extubated without difficulty and transported to the recovery room in satisfactory condition.  The patient tolerated the procedure well.  No complications.  No assistant.  Minimal blood loss.     Jene Every, M.D.     Cordelia Pen  D:  12/25/2011  T:  12/26/2011  Job:   [id number]

## 2011-12-26 NOTE — Evaluation (Signed)
Physical Therapy Evaluation Patient Details Name: [name] MRN: [id number] DOB: [birth year] Today's Date: 12/26/2011 Time: 1020-1046 PT Time Calculation (min): 26 min  PT Assessment / Plan / Recommendation Clinical Impression  31 yo female s/p ORIF L ankle. Mobilizing fairly well. Moderate pain after activity. Pt plans to d/c home today with husband's assistance. No follow-up PT needed at this time.     PT Assessment  Patent does not need any further PT services    Follow Up Recommendations  No PT follow up    Does the patient have the potential to tolerate intense rehabilitation      Barriers to Discharge        Equipment Recommendations   (Has crutches in room)    Recommendations for Other Services     Frequency      Precautions / Restrictions Precautions Precautions: Fall Restrictions Weight Bearing Restrictions: Yes LLE Weight Bearing: Non weight bearing   Pertinent Vitals/Pain 6/10 L ankle after ambulation      Mobility  Bed Mobility Bed Mobility: Supine to Sit Supine to Sit: 6: Modified independent (Device/Increase time) Transfers Transfers: Sit to Stand;Stand to Sit Sit to Stand: From bed;4: Min guard Stand to Sit: To chair/3-in-1;4: Min guard Details for Transfer Assistance: VCs safety, hand placement.  Ambulation/Gait Ambulation/Gait Assistance: 4: Min guard Ambulation Distance (Feet): 90 Feet Assistive device: Crutches Ambulation/Gait Assistance Details: VCs safety, technique, posture. Pt tends to take very large swing through. Encouraged less swing to increase control. Encouraged full upright posture instead of resting armpits on top of crutch.  Stairs: Yes Stairs Assistance: 4: Min assist Stairs Assistance Details (indicate cue type and reason): VCs safety, technique, sequence. Assist to stabilize. Pt did not feel she needed to practice a 2nd time.  Stair Management Technique: With crutches Number of Stairs: 2  (2 up , 3 down (portable  steps))    Shoulder Instructions     Exercises     PT Diagnosis:    PT Problem List:   PT Treatment Interventions:     PT Goals Acute Rehab PT Goals PT Goal Formulation: With patient Time For Goal Achievement: 01/02/12 Potential to Achieve Goals: Good Pt will go Sit to Stand: with supervision PT Goal: Sit to Stand - Progress: Goal set today Pt will go Stand to Sit: with supervision PT Goal: Stand to Sit - Progress: Goal set today Pt will Ambulate: 51 - 150 feet;with supervision;with crutches PT Goal: Ambulate - Progress: Goal set today Pt will Go Up / Down Stairs: 3-5 stairs;with supervision;with crutches PT Goal: Up/Down Stairs - Progress: Goal set today  Visit Information  Last PT Received On: 12/26/11 Assistance Needed: +1    Subjective Data  Subjective: "Ive been on crutches before, but its been a while" Patient Stated Goal: Home   Prior Functioning  Home Living Lives With: Spouse Available Help at Discharge: Family Type of Home: House Home Access: Stairs to enter Secretary/administrator of Steps: 3 Entrance Stairs-Rails: None Home Layout: One level Home Adaptive Equipment: Crutches Prior Function Level of Independence: Independent Able to Take Stairs?: Yes Driving: Yes Vocation: Full time employment Communication Communication: No difficulties    Cognition  Overall Cognitive Status: Appears within functional limits for tasks assessed/performed Arousal/Alertness: Awake/alert Orientation Level: Appears intact for tasks assessed Behavior During Session: [facility] for tasks performed    Extremity/Trunk Assessment Right Lower Extremity Assessment RLE ROM/Strength/Tone: [facility] for tasks assessed RLE Sensation: WFL - Light Touch Left Lower Extremity Assessment LLE ROM/Strength/Tone: Unable to fully  assess Trunk Assessment Trunk Assessment: Normal   Balance    End of Session PT - End of Session Equipment Utilized During Treatment: Gait belt Activity Tolerance:  Patient limited by pain Patient left: in chair;with call bell/phone within reach;with family/visitor present  GP Functional Assessment Tool Used: Clinical judgement Functional Limitation: Mobility: Walking and moving around Mobility: Walking and Moving Around Current Status ([id number]): At least 20 percent but less than 40 percent impaired, limited or restricted Mobility: Walking and Moving Around Goal Status [phone]): 0 percent impaired, limited or restricted Mobility: Walking and Moving Around Discharge Status [phone]): 0 percent impaired, limited or restricted   Rebeca Alert [facility] 12/26/2011, 10:56 AM [id number]

## 2011-12-26 NOTE — Progress Notes (Signed)
Subjective: 1 Day Post-Op Procedure(s) (LRB): OPEN REDUCTION INTERNAL FIXATION (ORIF) ANKLE FRACTURE (Left) CAST APPLICATION (Left) Patient reports pain as 4 on 0-10 scale.    Objective: Vital signs in last 24 hours: Temp:  [97.7 F (36.5 C)-99.4 F (37.4 C)] 98.6 F (37 C) (10/31 0530) Pulse Rate:  [63-122] 63  (10/31 0530) Resp:  [14-24] 16  (10/31 0530) BP: (94-122)/(53-83) 94/55 mmHg (10/31 0530) SpO2:  [95 %-100 %] 95 % (10/31 0530) Weight:  [95.255 kg (210 lb)] 95.255 kg (210 lb) (10/30 1426)  Intake/Output from previous day: 10/30 0701 - 10/31 0700 In: 3843.7 [P.O.:1140; I.V.:2703.7] Out: 2850 [Urine:2825; Blood:25] Intake/Output this shift: Total I/O In: 360 [P.O.:360] Out: -   No results found for this basename: HGB:5 in the last 72 hours No results found for this basename: WBC:2,RBC:2,HCT:2,PLT:2 in the last 72 hours No results found for this basename: NA:2,K:2,CL:2,CO2:2,BUN:2,CREATININE:2,GLUCOSE:2,CALCIUM:2 in the last 72 hours No results found for this basename: LABPT:2,INR:2 in the last 72 hours  Neurologically intact Neurovascular intact Sensation intact distally Intact pulses distally Dorsiflexion/Plantar flexion intact  Assessment/Plan: 1 Day Post-Op Procedure(s) (LRB): OPEN REDUCTION INTERNAL FIXATION (ORIF) ANKLE FRACTURE (Left) CAST APPLICATION (Left) Discharge home with home health  Charmine Bockrath C 12/26/2011, 9:25 AM

## 2011-12-27 ENCOUNTER — Encounter (HOSPITAL_COMMUNITY): Payer: Self-pay | Admitting: Specialist

## 2011-12-27 NOTE — Discharge Summary (Signed)
Patient ID: [name] MRN: [id number] DOB/AGE: 08-03-1980 31 y.o.  Admit date: 12/25/2011 Discharge date: 12/27/2011  Admission Diagnoses: Trimalleolar ankle fracturen Active Problems:  * No active hospital problems. *    Discharge Diagnoses:  Same  Past Medical History  Diagnosis Date  . Migraine   . Kidney stone   . Kidney stones   . Gallstones   . Kidney stone     Surgeries: Procedure(s): OPEN REDUCTION INTERNAL FIXATION (ORIF) ANKLE FRACTURE CAST APPLICATION on 12/25/2011   Consultants:  none  Discharged Condition: Improved  Hospital Course: [name] is an 31 y.o. female who was admitted 12/25/2011 for operative treatment of<principal problem not specified>. Patient has severe unremitting pain that affects sleep, daily activities, and work/hobbies. After pre-op clearance the patient was taken to the operating room on 12/25/2011 and underwent  Procedure(s): OPEN REDUCTION INTERNAL FIXATION (ORIF) ANKLE FRACTURE CAST APPLICATION.    Patient was given perioperative antibiotics:  Anti-infectives     Start     Dose/Rate Route Frequency Ordered Stop   12/25/11 2200   ceFAZolin (ANCEF) IVPB 2 g/50 mL premix        2 g 100 mL/hr over 30 Minutes Intravenous Every 6 hours 12/25/11 1950 12/26/11 1106   12/25/11 1707   polymyxin B 500,000 Units, bacitracin 50,000 Units in sodium chloride irrigation 0.9 % 500 mL irrigation  Status:  Discontinued          As needed 12/25/11 1708 12/25/11 1807   12/25/11 1530   clindamycin (CLEOCIN) IVPB 900 mg        900 mg 100 mL/hr over 30 Minutes Intravenous  Once 12/25/11 1522 12/25/11 1617   12/25/11 1522   ceFAZolin (ANCEF) IVPB 2 g/50 mL premix        2 g 100 mL/hr over 30 Minutes Intravenous 60 min pre-op 12/25/11 1522 12/25/11 1602           Patient was given sequential compression devices, early ambulation, and chemoprophylaxis to prevent DVT.  Patient benefited maximally from hospital stay and there were no  complications.    Recent vital signs: No data found.    Recent laboratory studies: No results found for this basename: WBC:2,HGB:2,HCT:2,PLT:2,NA:2,K:2,CL:2,CO2:2,BUN:2,CREATININE:2,GLUCOSE:2,PT:2,INR:2,CALCIUM,2: in the last 72 hours   Discharge Medications:     Medication List     As of 12/27/2011  1:26 PM    STOP taking these medications         naproxen sodium 220 MG tablet   Commonly known as: ANAPROX      TAKE these medications         cholecalciferol 1000 UNITS tablet   Commonly known as: VITAMIN D   Take 1,000 Units by mouth daily.      escitalopram 20 MG tablet   Commonly known as: LEXAPRO   Take 20 mg by mouth at bedtime.      ibuprofen 800 MG tablet   Commonly known as: ADVIL,MOTRIN   Take 1 tablet (800 mg total) by mouth 3 (three) times daily.      ketorolac 10 MG tablet   Commonly known as: TORADOL   Take 1 tablet (10 mg total) by mouth every 6 (six) hours as needed for pain.      ketorolac 10 MG tablet   Commonly known as: TORADOL   Take 1 tablet (10 mg total) by mouth every 6 (six) hours as needed for pain.      ketorolac 10 MG tablet   Commonly known as: TORADOL  Take 1 tablet (10 mg total) by mouth every 6 (six) hours as needed for pain.      methocarbamol 500 MG tablet   Commonly known as: ROBAXIN   Take 1 tablet (500 mg total) by mouth every 6 (six) hours as needed.      omeprazole 20 MG capsule   Commonly known as: PRILOSEC   Take 20 mg by mouth at bedtime.      oxyCODONE-acetaminophen 5-325 MG per tablet   Commonly known as: PERCOCET/ROXICET   1 to 2 tabs PO q6hrs  PRN for pain        Diagnostic Studies: Ct Abdomen Pelvis Wo Contrast  12/17/2011  *RADIOLOGY REPORT*  Clinical Data: Right flank pain  CT ABDOMEN AND PELVIS WITHOUT CONTRAST  Technique:  Multidetector CT imaging of the abdomen and pelvis was performed following the standard protocol without intravenous contrast.  Comparison: 12/06/2011  Findings: Limited images through  the lung bases demonstrate no significant appreciable abnormality. The heart size is within normal limits. No pleural or pericardial effusion.  Organ abnormality/lesion detection is limited in the absence of intravenous contrast. Within this limitation, unremarkable liver, spleen, pancreas, adrenal glands.  Cholecystectomy clips.  No biliary ductal dilatation. Nonobstructing bilateral renal stones.  No hydronephrosis or hydroureter.  No bowel obstruction.  Normal appendix.  No CT evidence for colitis.  No free intraperitoneal air or fluid.  No lymphadenopathy.  Normal caliber vasculature.  Decompressed bladder.  Uterus not visualized.  No adnexal mass.  No acute osseous finding.  IMPRESSION: Bilateral nonobstructing renal stones.  No hydronephrosis or ureteral calculi identified.   Original Report Authenticated By: Waneta Martins, M.D.    Ct Abdomen Pelvis Wo Contrast  12/06/2011  *RADIOLOGY REPORT*  Clinical Data: 31 year old female with left flank pain radiating to the left lower quadrant.  CT ABDOMEN AND PELVIS WITHOUT CONTRAST  Technique:  Multidetector CT imaging of the abdomen and pelvis was performed following the standard protocol without intravenous contrast.  Comparison: 08/04/2011 and earlier.  Findings: Lung bases are clear. No acute osseous abnormality identified.  Uterus surgically absent.  Adnexa stable and within normal limits. No pelvic free fluid.  Negative distal colon.  Bladder unremarkable.  Normal proximal colon and appendix.  No dilated small bowel. Stomach and duodenum within normal limits.  Gallbladder surgically absent.  Negative noncontrast liver, spleen, pancreas, and adrenal glands.  Nonobstructing left mid pole calculus measures 4 mm.  Punctate left lower pole nephrolithiasis also.  No left hydronephrosis or perinephric stranding.  No left hydroureter or periureteral stranding.  Normal course of the left ureter.  Punctate right mid pole nephrolithiasis.  Small lower pole  cortical cyst again noted.  No right hydronephrosis or perinephric stranding.  No right hydroureter.  Normal course of the right ureter.  No abdominal free fluid.  IMPRESSION: 1.  No obstructive uropathy.  Bilateral nonobstructing nephrolithiasis. 2.  Normal appendix.  No acute findings identified.   Original Report Authenticated By: Harley Hallmark, M.D.    Dg Ankle Complete Left  12/25/2011  *RADIOLOGY REPORT*  Clinical Data: ORIF  LEFT ANKLE COMPLETE - 3+ VIEW  Comparison: Same day  Findings: Four C-arm images show ORIF of a trimalleolar fracture. The fibula is treated with a lateral plate and screws.  The medial malleolus is treated with two screws.  Posterior tibial fracture does not show hardware reduction, but appears in near anatomic position.  IMPRESSION: ORIF of trimalleolar fracture as described.   Original Report Authenticated By: Janeece Riggers  [facility], M.D.    Dg Ankle Complete Left  12/25/2011  *RADIOLOGY REPORT*  Clinical Data: Post reduction  LEFT ANKLE COMPLETE - 3+ VIEW  Comparison: Same day  Findings: Pre views through a cast show improved position and alignment in the region of the ankle with transverse fracture of the medial malleolus, oblique fracture of the distal fibula and fracture of the posterior lip of the tibia.  IMPRESSION: Improved alignment following closed reduction.  Cast in place.   Original Report Authenticated By: Thomasenia Sales, M.D.    Dg Ankle Complete Left  12/25/2011  *RADIOLOGY REPORT*  Clinical Data: Twisting injury.  LEFT ANKLE COMPLETE - 3+ VIEW  Comparison: None.  Findings: Oblique fracture of the distal left fibula with separation of fracture fragments.  This may be comminuted.  Transverse/oblique fracture of the medial malleolus.  Fracture of the posterior inferior aspect of the tibia.  Interruption of the ankle mortise with the distal tibia displaced medially with regard to the talar dome.  Avulsion fracture of the posterior inferior aspect of the cuboid.   IMPRESSION: Trimalleolar fracture with interruption of the ankle mortise.  Small avulsion fracture of the cuboid.   Original Report Authenticated By: Fuller Canada, M.D.    Dg C-arm 1-60 Min-no Report  12/25/2011  CLINICAL DATA: fractured left ankle   C-ARM 1-60 MINUTES  Fluoroscopy was utilized by the requesting physician.  No radiographic  interpretation.      Disposition: 01-Home or Self Care        Follow-up Information    Follow up with Napoleon Monacelli C, MD. In 2 weeks.   Contact information:   [facility] [address], [address] [place] [address] [phone]           Signed: Javier Docker 12/27/2011, 1:26 PM

## 2012-01-12 ENCOUNTER — Emergency Department (HOSPITAL_COMMUNITY)
Admission: EM | Admit: 2012-01-12 | Discharge: 2012-01-12 | Disposition: A | Discharge status: Home / Self Care | Payer: BC Managed Care – PPO | Attending: Emergency Medicine | Admitting: Emergency Medicine

## 2012-01-12 ENCOUNTER — Encounter (HOSPITAL_COMMUNITY): Payer: Self-pay | Admitting: Emergency Medicine

## 2012-01-12 ENCOUNTER — Emergency Department (HOSPITAL_COMMUNITY): Payer: BC Managed Care – PPO

## 2012-01-12 DIAGNOSIS — Z79899 Other long term (current) drug therapy: Secondary | ICD-10-CM | POA: Insufficient documentation

## 2012-01-12 DIAGNOSIS — G43909 Migraine, unspecified, not intractable, without status migrainosus: Secondary | ICD-10-CM | POA: Insufficient documentation

## 2012-01-12 DIAGNOSIS — M25579 Pain in unspecified ankle and joints of unspecified foot: Secondary | ICD-10-CM | POA: Insufficient documentation

## 2012-01-12 DIAGNOSIS — Z9889 Other specified postprocedural states: Secondary | ICD-10-CM | POA: Insufficient documentation

## 2012-01-12 DIAGNOSIS — Z87891 Personal history of nicotine dependence: Secondary | ICD-10-CM | POA: Insufficient documentation

## 2012-01-12 DIAGNOSIS — Z87442 Personal history of urinary calculi: Secondary | ICD-10-CM | POA: Insufficient documentation

## 2012-01-12 DIAGNOSIS — M25572 Pain in left ankle and joints of left foot: Secondary | ICD-10-CM

## 2012-01-12 MED ORDER — HYDROMORPHONE HCL PF 1 MG/ML IJ SOLN
1.0000 mg | Freq: Once | INTRAMUSCULAR | Status: DC
  Filled 2012-01-12: qty 1

## 2012-01-12 MED ORDER — MORPHINE SULFATE 15 MG PO TABS: 15.0000 mg | ORAL_TABLET | ORAL | Status: DC | PRN

## 2012-01-12 MED ORDER — ONDANSETRON 8 MG PO TBDP
8.0000 mg | ORAL_TABLET | Freq: Once | ORAL | Status: AC
  Administered 2012-01-12: 8 mg via ORAL
  Filled 2012-01-12: qty 1

## 2012-01-12 MED ORDER — HYDROMORPHONE HCL PF 1 MG/ML IJ SOLN
1.0000 mg | Freq: Once | INTRAMUSCULAR | Status: AC
  Administered 2012-01-12: 1 mg via INTRAMUSCULAR

## 2012-01-12 MED ORDER — ONDANSETRON HCL 4 MG/2ML IJ SOLN
4.0000 mg | Freq: Once | INTRAMUSCULAR | Status: DC
  Filled 2012-01-12: qty 2

## 2012-01-12 NOTE — ED Notes (Signed)
Patient transported to X-ray 

## 2012-01-12 NOTE — ED Notes (Signed)
Pt reports having increased pain 9/10 left inner-ankle pain. Pt reports that her pain has been subsiding over the past few weeks, however two days ago severe and frequent pain returned. Pt had surgery in October for a trimalleolar fracture of the left ankle. Pt reports pain is "stabbing" and feels as if a pin from the surgery is causing the pain. Capillary refill less than three seconds and moderately strong pedal pulse.

## 2012-01-12 NOTE — ED Provider Notes (Signed)
[name] is a 30 y.o. female who is here for worsening pain in her left ankle and foot. She has been wearing a splint Postoperatively; after repair of ankle fracture. She has been removing the splint when she is lying down. She denies trauma since the surgery. There is no chest pain, shortness of breath, nausea, vomiting, leg, or back pain.  Examination: Healing wounds, left ankle, medial and lateral. She has pain and tenderness in the recess behind the medial malleolus. This is consistent with dislocation of the flexor tendon bundles; there is mild associated swelling, as well as pain at this site with both flexion and extension of the ankle. This evaluation is consistent with tendinitis. I doubt DVT, joint infection, or cellulitis.  Plan symptomatic care: Elevation, and heat. Followup with her orthopedist tomorrow.   Medical screening examination/treatment/procedure(s) were conducted as a shared visit with non-physician practitioner(s) and myself.  I personally evaluated the patient during the encounter  Flint Melter, MD 01/12/12 1400

## 2012-01-12 NOTE — ED Notes (Signed)
MD at bedside with [place] to locate pedal pulse

## 2012-01-12 NOTE — ED Provider Notes (Signed)
History     CSN: [id number]  Arrival date & time 01/12/12  1118   First MD Initiated Contact with Patient 01/12/12 1143      Chief Complaint  Patient presents with  . Leg Pain  . Post-op Problem    (Consider location/radiation/quality/duration/timing/severity/associated sxs/prior treatment) HPI  [name] is a 31 y.o. female complaining of approximately and severely increasing left medial malleolus pain starting 2 days ago. Patient had a trimalleolar fracture that was surgically pinned by Dr. Shelle Iron several weeks ago. The pain had been reducing until 2 days ago. She denies any trauma, numbness or paresthesia. She was taking Robaxin and 5 mg Percocet with minimal relief. Patient has not slept since 2 days ago. The patient has an appointment with Dr. Shelle Iron first thing in the morning to get tomorrow. She was advised to come to the ED for x-rays and pain control today. Patient was advised to use her cam walker however she has not done that because it is very heavy and painful. She has been using instead the posterior splint.   Past Medical History  Diagnosis Date  . Migraine   . Kidney stone   . Kidney stones   . Gallstones   . Kidney stone     Past Surgical History  Procedure Date  . Abdominal hysterectomy   . Tonsillectomy   . Lithotripsy   . Cholecystectomy 03/11/2011    Procedure: LAPAROSCOPIC CHOLECYSTECTOMY WITH INTRAOPERATIVE CHOLANGIOGRAM;  Surgeon: Atilano Ina, MD;  Location: [facility] OR;  Service: General;  Laterality: N/A;  . Cholecystectomy, laparoscopic   . Orif ankle fracture 12/25/2011    Procedure: OPEN REDUCTION INTERNAL FIXATION (ORIF) ANKLE FRACTURE;  Surgeon: Javier Docker, MD;  Location: [facility] ORS;  Service: Orthopedics;  Laterality: Left;  . Cast application 12/25/2011    Procedure: CAST APPLICATION;  Surgeon: Javier Docker, MD;  Location: [facility] ORS;  Service: Orthopedics;  Laterality: Left;    Family History  Problem Relation Age of Onset  . Diabetes  Mother   . Diabetes Father   . Heart failure Father   . Hypertension Father   . Pulmonary fibrosis Father     History  Substance Use Topics  . Smoking status: Former Smoker -- 0.5 packs/day    Types: Cigarettes  . Smokeless tobacco: Not on file  . Alcohol Use: Yes     Comment: occaisional    OB History    Grav Para Term Preterm Abortions TAB SAB Ect Mult Living                  Review of Systems  Constitutional: Negative for fever.  Respiratory: Negative for shortness of breath.   Cardiovascular: Negative for chest pain.  Gastrointestinal: Negative for nausea, vomiting, abdominal pain and diarrhea.  Musculoskeletal: Positive for arthralgias.  Neurological: Negative for numbness.  All other systems reviewed and are negative.    Allergies  Compazine and Metoclopramide hcl  Home Medications   Current Outpatient Rx  Name  Route  Sig  Dispense  Refill  . VITAMIN D 1000 UNITS PO TABS   Oral   Take 1,000 Units by mouth daily.         Marland Kitchen ESCITALOPRAM OXALATE 20 MG PO TABS   Oral   Take 20 mg by mouth at bedtime.          . IBUPROFEN 800 MG PO TABS   Oral   Take 1 tablet (800 mg total) by mouth 3 (three) times daily.  30 tablet   0   . KETOROLAC TROMETHAMINE 10 MG PO TABS   Oral   Take 1 tablet (10 mg total) by mouth every 6 (six) hours as needed for pain.   20 tablet   0   . METHOCARBAMOL 500 MG PO TABS   Oral   Take 1 tablet (500 mg total) by mouth every 6 (six) hours as needed.   40 tablet   2   . OMEPRAZOLE 20 MG PO CPDR   Oral   Take 20 mg by mouth at bedtime.          . OXYCODONE-ACETAMINOPHEN 5-325 MG PO TABS      1 to 2 tabs PO q6hrs  PRN for pain   40 tablet   0     BP 116/79  Pulse 93  Temp 98.1 F (36.7 C) (Oral)  Resp 18  Ht 5\' 7"  (1.702 m)  Wt 200 lb (90.719 kg)  BMI 31.32 kg/m2  SpO2 99%  Physical Exam  Nursing note and vitals reviewed. Constitutional: She is oriented to person, place, and time. She appears  well-developed and well-nourished. No distress.  HENT:  Head: Normocephalic.  Eyes: Conjunctivae normal and EOM are normal.  Cardiovascular: Normal rate.   Pulmonary/Chest: Effort normal. No stridor.  Musculoskeletal: Normal range of motion.       Well healing surgical incisions to the left medial and lateral malleolus with steri-strips in place. minimal swelling and erythema to the painful inferior medal malleolus. Cap refill is <2s x5 digits. Dorsalis pedis 2+ bilaterally.   Neurological: She is alert and oriented to person, place, and time.  Psychiatric: She has a normal mood and affect.    ED Course  Procedures (including critical care time)  Labs Reviewed - No data to display Dg Ankle Complete Left  01/12/2012  *RADIOLOGY REPORT*  Clinical Data: Postop trimalleolar fracture, medial ankle pain  LEFT ANKLE COMPLETE - 3+ VIEW  Comparison: Intraop radiographs dated 12/25/2011  Findings: Status post ORIF of the medial malleolus with to cannulated cancellous screws.  Lateral compression plate and screw fixation of a distal fibular fracture.  No evidence of hardware loosening or fracture.  Posterior malleolar fracture in near anatomic alignment and position.  Mild soft tissue swelling.  IMPRESSION: Status post ORIF of the medial and lateral malleoli.  No evidence of hardware complication.  Prior posterior malleolar fracture in near anatomic alignment and position.  No evidence of acute fracture or dislocation.   Original Report Authenticated By: Charline Bills, M.D.    EMERGENCY DEPARTMENT US SOFT TISSUE INTERPRETATION "Study: Limited Ultrasound of the noted body part in comments below"  INDICATIONS: Pain Multiple views of the body part are obtained with a multi-frequency linear probe  PERFORMED BY:  Myself  IMAGES ARCHIVED?: No  SIDE:Left  BODY PART:Other soft tisse (comment in note)  FINDINGS: Other Posterior tibial arterial pulsations observed  LIMITATIONS:  Soft tissue  swelling   1. Ankle pain, left       MDM  Patient with new onset of severe medial malleolus pain status post ORIF 3 weeks ago.  Shows no abnormalities, physical exam is also benign with no swelling, erythema, discoloration. Neurovascularly intact. This is a shared visit with attending Dr. Effie Shy who feels this is likely a tendinitis. Patient will follow with her orthopedist Dr. Shelle Iron first thing in the morning. I will give her pain control until she can followup with him.   Pt verbalized understanding and agrees with care plan.  Outpatient follow-up and return precautions given.    New Prescriptions   MORPHINE (MSIR) 15 MG TABLET    Take 1 tablet (15 mg total) by mouth every 4 (four) hours as needed for pain.           Wynetta Emery, PA-C 01/12/12 1407

## 2012-03-09 ENCOUNTER — Emergency Department (HOSPITAL_COMMUNITY): Payer: BC Managed Care – PPO

## 2012-03-09 ENCOUNTER — Encounter (HOSPITAL_COMMUNITY): Payer: Self-pay | Admitting: *Deleted

## 2012-03-09 ENCOUNTER — Emergency Department (HOSPITAL_COMMUNITY)
Admission: EM | Admit: 2012-03-09 | Discharge: 2012-03-09 | Disposition: A | Discharge status: Home / Self Care | Payer: BC Managed Care – PPO | Attending: Emergency Medicine | Admitting: Emergency Medicine

## 2012-03-09 DIAGNOSIS — S8990XA Unspecified injury of unspecified lower leg, initial encounter: Secondary | ICD-10-CM | POA: Insufficient documentation

## 2012-03-09 DIAGNOSIS — Z87442 Personal history of urinary calculi: Secondary | ICD-10-CM | POA: Insufficient documentation

## 2012-03-09 DIAGNOSIS — Z79899 Other long term (current) drug therapy: Secondary | ICD-10-CM | POA: Insufficient documentation

## 2012-03-09 DIAGNOSIS — S99929A Unspecified injury of unspecified foot, initial encounter: Secondary | ICD-10-CM | POA: Insufficient documentation

## 2012-03-09 DIAGNOSIS — Z87891 Personal history of nicotine dependence: Secondary | ICD-10-CM | POA: Insufficient documentation

## 2012-03-09 DIAGNOSIS — Y939 Activity, unspecified: Secondary | ICD-10-CM | POA: Insufficient documentation

## 2012-03-09 DIAGNOSIS — Z9889 Other specified postprocedural states: Secondary | ICD-10-CM | POA: Insufficient documentation

## 2012-03-09 DIAGNOSIS — Z8679 Personal history of other diseases of the circulatory system: Secondary | ICD-10-CM | POA: Insufficient documentation

## 2012-03-09 DIAGNOSIS — S99912A Unspecified injury of left ankle, initial encounter: Secondary | ICD-10-CM

## 2012-03-09 DIAGNOSIS — W010XXA Fall on same level from slipping, tripping and stumbling without subsequent striking against object, initial encounter: Secondary | ICD-10-CM | POA: Insufficient documentation

## 2012-03-09 DIAGNOSIS — Y929 Unspecified place or not applicable: Secondary | ICD-10-CM | POA: Insufficient documentation

## 2012-03-09 MED ORDER — OXYCODONE-ACETAMINOPHEN 5-325 MG PO TABS
1.0000 | ORAL_TABLET | Freq: Once | ORAL | Status: AC
  Administered 2012-03-09: 1 via ORAL
  Filled 2012-03-09: qty 1

## 2012-03-09 MED ORDER — OXYCODONE-ACETAMINOPHEN 5-325 MG PO TABS: 2.0000 | ORAL_TABLET | ORAL | Status: DC | PRN

## 2012-03-09 NOTE — ED Notes (Signed)
Pt states that she twisted her Lt ankle tonight, and heard a popping sound. Redness and swelling noted at site. Pt able to wiggle all digits, skin warm and dry, pulses present, and sensation present. Previous open reduction internal fixation on same ankle 2 months ago.

## 2012-03-09 NOTE — ED Provider Notes (Signed)
History     CSN: [id number]  Arrival date & time 03/09/12  4098   First MD Initiated Contact with Patient 03/09/12 [phone]      Chief Complaint  Patient presents with  . Ankle Pain    (Consider location/radiation/quality/duration/timing/severity/associated sxs/prior treatment) HPI History provided by patient. Left ankle pain. Had left ankle surgery about 8 weeks ago, has been healing well. Recently got out of her splint. Tonight he got up to go the bathroom and tripped injuring her left ankle. She heard a pop but wasn't sure if that was her ankle or the floor. Pain is moderate in severity. Sharp in quality and located lateral aspect of ankle and the area of her surgical scar. There is no swelling. No weakness or numbness. Patient does not notice any deformity. She is worried about hardware in her ankle. Past Medical History  Diagnosis Date  . Migraine   . Kidney stone   . Kidney stones   . Gallstones   . Kidney stone     Past Surgical History  Procedure Date  . Abdominal hysterectomy   . Tonsillectomy   . Lithotripsy   . Cholecystectomy 03/11/2011    Procedure: LAPAROSCOPIC CHOLECYSTECTOMY WITH INTRAOPERATIVE CHOLANGIOGRAM;  Surgeon: Atilano Ina, MD;  Location: [facility] OR;  Service: General;  Laterality: N/A;  . Cholecystectomy, laparoscopic   . Orif ankle fracture 12/25/2011    Procedure: OPEN REDUCTION INTERNAL FIXATION (ORIF) ANKLE FRACTURE;  Surgeon: Javier Docker, MD;  Location: [facility] ORS;  Service: Orthopedics;  Laterality: Left;  . Cast application 12/25/2011    Procedure: CAST APPLICATION;  Surgeon: Javier Docker, MD;  Location: [facility] ORS;  Service: Orthopedics;  Laterality: Left;    Family History  Problem Relation Age of Onset  . Diabetes Mother   . Diabetes Father   . Heart failure Father   . Hypertension Father   . Pulmonary fibrosis Father     History  Substance Use Topics  . Smoking status: Former Smoker -- 0.5 packs/day    Types: Cigarettes  . Smokeless  tobacco: Not on file  . Alcohol Use: Yes     Comment: occaisional    OB History    Grav Para Term Preterm Abortions TAB SAB Ect Mult Living                  Review of Systems  Constitutional: Negative for fever and chills.  HENT: Positive for neck stiffness. Negative for neck pain.   Eyes: Negative for visual disturbance.  Respiratory: Negative for shortness of breath.   Cardiovascular: Negative for leg swelling.  Gastrointestinal: Negative for abdominal pain.  Genitourinary: Negative for flank pain.  Musculoskeletal: Negative for back pain.  Skin: Negative for rash.  Neurological: Negative for weakness and numbness.  All other systems reviewed and are negative.    Allergies  Compazine and Metoclopramide hcl  Home Medications   Current Outpatient Rx  Name  Route  Sig  Dispense  Refill  . VITAMIN D 1000 UNITS PO TABS   Oral   Take 1,000 Units by mouth daily.         Marland Kitchen ESCITALOPRAM OXALATE 20 MG PO TABS   Oral   Take 20 mg by mouth at bedtime.          . IBUPROFEN 800 MG PO TABS   Oral   Take 1 tablet (800 mg total) by mouth 3 (three) times daily.   30 tablet   0   . KETOROLAC TROMETHAMINE  10 MG PO TABS   Oral   Take 1 tablet (10 mg total) by mouth every 6 (six) hours as needed for pain.   20 tablet   0   . METHOCARBAMOL 500 MG PO TABS   Oral   Take 1 tablet (500 mg total) by mouth every 6 (six) hours as needed.   40 tablet   2   . MORPHINE SULFATE 15 MG PO TABS   Oral   Take 1 tablet (15 mg total) by mouth every 4 (four) hours as needed for pain.   5 tablet   0   . OMEPRAZOLE 20 MG PO CPDR   Oral   Take 20 mg by mouth at bedtime.            BP 116/73  Pulse 92  Temp 97.8 F (36.6 C) (Oral)  Resp 18  SpO2 95%  Physical Exam  Constitutional: She is oriented to person, place, and time. She appears well-developed and well-nourished.  HENT:  Head: Normocephalic and atraumatic.  Eyes: Conjunctivae normal and EOM are normal. Pupils are  equal, round, and reactive to light.  Neck: Trachea normal. Neck supple. No thyromegaly present.  Cardiovascular: Normal rate, regular rhythm, S1 normal, S2 normal, intact distal pulses and normal pulses.     No systolic murmur is present   No diastolic murmur is present  Pulses:      Radial pulses are 2+ on the right side, and 2+ on the left side.  Pulmonary/Chest: Breath sounds normal. She exhibits no tenderness.  Abdominal: Soft. Normal appearance and bowel sounds are normal. There is no tenderness. There is no CVA tenderness and negative Murphy's sign.  Musculoskeletal:       Left lower extremity with well-healing scar over lateral aspect of ankle. Mild tenderness over her lateral malleolus without obvious formerly. There is no swelling. No ecchymosis. Distal neurovascular intact with equal dorsalis pedis pulses. Nontender over the proximal fibula and left hip  Neurological: She is alert and oriented to person, place, and time. She has normal strength. No cranial nerve deficit or sensory deficit. GCS eye subscore is 4. GCS verbal subscore is 5. GCS motor subscore is 6.  Skin: Skin is warm and dry. No rash noted. She is not diaphoretic.  Psychiatric: Her speech is normal.       Cooperative and appropriate    ED Course  Procedures (including critical care time)  Labs Reviewed - No data to display Dg Ankle Complete Left  03/09/2012  *RADIOLOGY REPORT*  Clinical Data: Twisted ankle 2 months ago.  Twisted ankle again tonight.  Lateral pain.  LEFT ANKLE COMPLETE - 3+ VIEW  Comparison: 01/12/2012  Findings: The plate screw fixation of healing fractures of the lateral malleolus and screw fixation of the medial malleolus. Fracture lines remain visible.  The surgical hardware appears intact without change in position.  Bone fragments appear unchanged in position.  No dislocation of the ankle.  No focal bone lesion or bone destruction.Os trigonum.  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  Plantar  calcaneal spur.  IMPRESSION: Trimalleolar fractures of the left ankle with hardware fixation of the medial and lateral malleoli.  No significant change in position or orientation of hardware or fractures since the previous studies.   Original Report Authenticated By: Burman Nieves, M.D.      Percocet PO. xray  MDM   Left ankle pain, tripping injury status post ankle surgery 8 weeks ago. Pain medications provided. X-ray obtained and  reviewed as above. No new deformity or complication of surgical hardware.  Plan pain medications, use crutches as needed and followup with orthopedics        Sunnie Nielsen, MD 03/09/12 [phone]

## 2012-03-09 NOTE — ED Notes (Signed)
Per triage, pt is in radiology.

## 2012-03-12 ENCOUNTER — Emergency Department (HOSPITAL_COMMUNITY): Payer: BC Managed Care – PPO

## 2012-03-12 ENCOUNTER — Emergency Department (HOSPITAL_COMMUNITY)
Admission: EM | Admit: 2012-03-12 | Discharge: 2012-03-12 | Disposition: A | Discharge status: Home / Self Care | Payer: BC Managed Care – PPO | Attending: Emergency Medicine | Admitting: Emergency Medicine

## 2012-03-12 ENCOUNTER — Encounter (HOSPITAL_COMMUNITY): Payer: Self-pay | Admitting: Family Medicine

## 2012-03-12 DIAGNOSIS — R319 Hematuria, unspecified: Secondary | ICD-10-CM | POA: Insufficient documentation

## 2012-03-12 DIAGNOSIS — R109 Unspecified abdominal pain: Secondary | ICD-10-CM | POA: Insufficient documentation

## 2012-03-12 DIAGNOSIS — Z8679 Personal history of other diseases of the circulatory system: Secondary | ICD-10-CM | POA: Insufficient documentation

## 2012-03-12 DIAGNOSIS — R110 Nausea: Secondary | ICD-10-CM | POA: Insufficient documentation

## 2012-03-12 DIAGNOSIS — Z79899 Other long term (current) drug therapy: Secondary | ICD-10-CM | POA: Insufficient documentation

## 2012-03-12 DIAGNOSIS — F17200 Nicotine dependence, unspecified, uncomplicated: Secondary | ICD-10-CM | POA: Insufficient documentation

## 2012-03-12 DIAGNOSIS — R197 Diarrhea, unspecified: Secondary | ICD-10-CM | POA: Insufficient documentation

## 2012-03-12 DIAGNOSIS — Z90710 Acquired absence of both cervix and uterus: Secondary | ICD-10-CM | POA: Insufficient documentation

## 2012-03-12 DIAGNOSIS — Z87442 Personal history of urinary calculi: Secondary | ICD-10-CM | POA: Insufficient documentation

## 2012-03-12 DIAGNOSIS — Z9049 Acquired absence of other specified parts of digestive tract: Secondary | ICD-10-CM | POA: Insufficient documentation

## 2012-03-12 LAB — URINALYSIS, ROUTINE W REFLEX MICROSCOPIC
Bilirubin Urine: NEGATIVE
Glucose, UA: NEGATIVE mg/dL
Ketones, ur: NEGATIVE mg/dL
Nitrite: NEGATIVE
Protein, ur: NEGATIVE mg/dL
Specific Gravity, Urine: 1.02 (ref 1.005–1.030)
Urobilinogen, UA: 0.2 mg/dL (ref 0.0–1.0)
pH: 7 (ref 5.0–8.0)

## 2012-03-12 LAB — URINE MICROSCOPIC-ADD ON

## 2012-03-12 LAB — POCT I-STAT, CHEM 8
BUN: 12 mg/dL (ref 6–23)
Calcium, Ion: 1.21 mmol/L (ref 1.12–1.23)
Chloride: 106 mEq/L (ref 96–112)
Creatinine, Ser: 0.7 mg/dL (ref 0.50–1.10)
Glucose, Bld: 85 mg/dL (ref 70–99)
HCT: 41 % (ref 36.0–46.0)
Hemoglobin: 13.9 g/dL (ref 12.0–15.0)
Potassium: 4 mEq/L (ref 3.5–5.1)
Sodium: 141 mEq/L (ref 135–145)
TCO2: 26 mmol/L (ref 0–100)

## 2012-03-12 MED ORDER — ONDANSETRON HCL 4 MG/2ML IJ SOLN
4.0000 mg | Freq: Once | INTRAMUSCULAR | Status: AC
  Administered 2012-03-12: 4 mg via INTRAVENOUS
  Filled 2012-03-12: qty 2

## 2012-03-12 MED ORDER — HYDROMORPHONE HCL PF 1 MG/ML IJ SOLN
1.0000 mg | Freq: Once | INTRAMUSCULAR | Status: AC
  Administered 2012-03-12: 1 mg via INTRAVENOUS
  Filled 2012-03-12: qty 1

## 2012-03-12 MED ORDER — KETOROLAC TROMETHAMINE 30 MG/ML IJ SOLN
30.0000 mg | Freq: Once | INTRAMUSCULAR | Status: AC
  Administered 2012-03-12: 30 mg via INTRAVENOUS
  Filled 2012-03-12: qty 1

## 2012-03-12 MED ORDER — SODIUM CHLORIDE 0.9 % IV BOLUS (SEPSIS)
1000.0000 mL | Freq: Once | INTRAVENOUS | Status: AC
  Administered 2012-03-12: 1000 mL via INTRAVENOUS

## 2012-03-12 NOTE — ED Notes (Addendum)
Patient states that she thinks she may have kidney stone. Has a history of kidney stones requiring a stent. States pain has persisted for a week and has gotten worse. Indicates left flank area. Took Percocet for pain and that decreased pain to an 8 but states that pain is not tolerable.

## 2012-03-12 NOTE — ED Provider Notes (Signed)
History     CSN: [id number]  Arrival date & time 03/12/12  2037   First MD Initiated Contact with Patient 03/12/12 2129      Chief Complaint  Patient presents with  . Flank Pain   HPI  History provided by the patient. Patient is a 32 year old female with history of multiple prior kidney stones who presents with complaints of worsening left flank pain typical of previous kidney stone. Patient reports first having some intermittent pains over the past week. She does report symptoms were also accompanied with some loose diarrhea type stools for the past week. Symptoms also associated with nausea. Pain is located in the left flank with some radiation to the left groin. She also reports noticing today some slight pink and blood type color to the urine. Patient's last stone was in 2012 requiring stent and lithotripsy. Patient has followed with Dr. Patsi Sears. Patient has used some Percocet pain medications from recent orthopedic procedure for her symptoms with some relief of pain. She denies any other associated symptoms. Denies any fever chills or sweats. Denies vomiting.    Past Medical History  Diagnosis Date  . Migraine   . Kidney stone   . Kidney stones   . Gallstones   . Kidney stone     Past Surgical History  Procedure Date  . Abdominal hysterectomy   . Tonsillectomy   . Lithotripsy   . Cholecystectomy 03/11/2011    Procedure: LAPAROSCOPIC CHOLECYSTECTOMY WITH INTRAOPERATIVE CHOLANGIOGRAM;  Surgeon: Atilano Ina, MD;  Location: [facility] OR;  Service: General;  Laterality: N/A;  . Cholecystectomy, laparoscopic   . Orif ankle fracture 12/25/2011    Procedure: OPEN REDUCTION INTERNAL FIXATION (ORIF) ANKLE FRACTURE;  Surgeon: Javier Docker, MD;  Location: [facility] ORS;  Service: Orthopedics;  Laterality: Left;  . Cast application 12/25/2011    Procedure: CAST APPLICATION;  Surgeon: Javier Docker, MD;  Location: [facility] ORS;  Service: Orthopedics;  Laterality: Left;    Family History    Problem Relation Age of Onset  . Diabetes Mother   . Diabetes Father   . Heart failure Father   . Hypertension Father   . Pulmonary fibrosis Father     History  Substance Use Topics  . Smoking status: Current Every Day Smoker -- 0.2 packs/day    Types: Cigarettes  . Smokeless tobacco: Not on file  . Alcohol Use: No     Comment: occaisional    OB History    Grav Para Term Preterm Abortions TAB SAB Ect Mult Living                  Review of Systems  Constitutional: Negative for fever, chills and diaphoresis.  Respiratory: Negative for shortness of breath.   Cardiovascular: Negative for chest pain.  Gastrointestinal: Positive for nausea and diarrhea. Negative for vomiting, abdominal pain and constipation.  Genitourinary: Positive for hematuria and flank pain. Negative for dysuria and frequency.  All other systems reviewed and are negative.    Allergies  Compazine and Metoclopramide hcl  Home Medications   Current Outpatient Rx  Name  Route  Sig  Dispense  Refill  . VITAMIN D 1000 UNITS PO TABS   Oral   Take 2,000 Units by mouth daily.          Marland Kitchen ESCITALOPRAM OXALATE 20 MG PO TABS   Oral   Take 20 mg by mouth at bedtime.          . OMEPRAZOLE 20 MG PO CPDR  Oral   Take 20 mg by mouth at bedtime.          . OXYCODONE-ACETAMINOPHEN 5-325 MG PO TABS   Oral   Take 2 tablets by mouth every 4 (four) hours as needed for pain.   15 tablet   0   . PROMETHAZINE HCL 25 MG PO TABS   Oral   Take 25 mg by mouth every 6 (six) hours as needed. Nausea/vomiting.           BP 126/86  Pulse 82  Temp 98.9 F (37.2 C) (Oral)  Resp 20  SpO2 100%  Physical Exam  Nursing note and vitals reviewed. Constitutional: She is oriented to person, place, and time. She appears well-developed and well-nourished. No distress.  HENT:  Head: Normocephalic.  Cardiovascular: Normal rate and regular rhythm.   No murmur heard. Pulmonary/Chest: Effort normal and breath sounds  normal. No respiratory distress. She has no wheezes. She has no rales.  Abdominal: Soft. There is no tenderness. There is no rebound and no guarding.       Left CVA tenderness  Neurological: She is alert and oriented to person, place, and time.  Skin: Skin is warm and dry.  Psychiatric: She has a normal mood and affect. Her behavior is normal.    ED Course  Procedures   Results for orders placed during the hospital encounter of 03/12/12  URINALYSIS, ROUTINE W REFLEX MICROSCOPIC      Component Value Range   Color, Urine RED (*) YELLOW   APPearance CLOUDY (*) CLEAR   Specific Gravity, Urine 1.020  1.005 - 1.030   pH 7.0  5.0 - 8.0   Glucose, UA NEGATIVE  NEGATIVE mg/dL   Hgb urine dipstick LARGE (*) NEGATIVE   Bilirubin Urine NEGATIVE  NEGATIVE   Ketones, ur NEGATIVE  NEGATIVE mg/dL   Protein, ur NEGATIVE  NEGATIVE mg/dL   Urobilinogen, UA 0.2  0.0 - 1.0 mg/dL   Nitrite NEGATIVE  NEGATIVE   Leukocytes, UA SMALL (*) NEGATIVE  URINE MICROSCOPIC-ADD ON      Component Value Range   Squamous Epithelial / LPF RARE  RARE   WBC, UA 0-2  <3 WBC/hpf   RBC / HPF 21-50  <3 RBC/hpf   Bacteria, UA RARE  RARE  POCT I-STAT, CHEM 8      Component Value Range   Sodium 141  135 - 145 mEq/L   Potassium 4.0  3.5 - 5.1 mEq/L   Chloride 106  96 - 112 mEq/L   BUN 12  6 - 23 mg/dL   Creatinine, Ser 2.95  0.50 - 1.10 mg/dL   Glucose, Bld 85  70 - 99 mg/dL   Calcium, Ion 6.21  3.08 - 1.23 mmol/L   TCO2 26  0 - 100 mmol/L   Hemoglobin 13.9  12.0 - 15.0 g/dL   HCT 65.7  84.6 - 96.2 %      Dg Abd 1 View  03/12/2012  *RADIOLOGY REPORT*  Clinical Data: Left flank pain.  History of kidney stones.  ABDOMEN - 1 VIEW  Comparison: CT abdomen 12/17/2011.  Abdominal radiographs 08/04/2011.  Findings: 3 mm calculus projected over the mid pole of the left kidney appears stable in size and position since previous study. No additional stones are demonstrated.  Scattered gas and stool in the colon.  No small or  large bowel distension.  Surgical clips in the right upper quadrant.  Visualized bones appear intact.  No significant change since previous study.  IMPRESSION: Stable appearance of 3 mm calculus projected over the left kidney. No additional stones.  Normal bowel gas pattern.   Original Report Authenticated By: Burman Nieves, M.D.      1. Flank pain   2. Hematuria       MDM  9:45 PM patient seen and evaluated. Patient rocking back and forth in the bed and appears uncomfortable. Symptoms are similar to prior multiple kidney stones. Patient reports past ohms may have caused emotionally. Will obtain KUB initially given multiple CT scans in the past.  KUB with small 3 mm stone within the left kidney. No signs of a ureteral stone. Patient feeling significantly better after medications. Patient does have bili followup with urologist later today. At this time we'll discharge home.      Phill Mutter [place], [place] 03/14/12 [phone]

## 2012-03-14 NOTE — ED Provider Notes (Signed)
Medical screening examination/treatment/procedure(s) were performed by non-physician practitioner and as supervising physician I was immediately available for consultation/collaboration.    Zykerria Tanton L Camri Molloy, MD 03/14/12 1530 

## 2012-03-20 ENCOUNTER — Encounter (HOSPITAL_COMMUNITY): Payer: Self-pay | Admitting: Emergency Medicine

## 2012-03-20 ENCOUNTER — Emergency Department (HOSPITAL_COMMUNITY)
Admission: EM | Admit: 2012-03-20 | Discharge: 2012-03-20 | Disposition: A | Discharge status: Home / Self Care | Payer: BC Managed Care – PPO | Attending: Emergency Medicine | Admitting: Emergency Medicine

## 2012-03-20 DIAGNOSIS — F17200 Nicotine dependence, unspecified, uncomplicated: Secondary | ICD-10-CM | POA: Insufficient documentation

## 2012-03-20 DIAGNOSIS — Z8719 Personal history of other diseases of the digestive system: Secondary | ICD-10-CM | POA: Insufficient documentation

## 2012-03-20 DIAGNOSIS — Z79899 Other long term (current) drug therapy: Secondary | ICD-10-CM | POA: Insufficient documentation

## 2012-03-20 DIAGNOSIS — R109 Unspecified abdominal pain: Secondary | ICD-10-CM | POA: Insufficient documentation

## 2012-03-20 DIAGNOSIS — Z87442 Personal history of urinary calculi: Secondary | ICD-10-CM | POA: Insufficient documentation

## 2012-03-20 DIAGNOSIS — Z8679 Personal history of other diseases of the circulatory system: Secondary | ICD-10-CM | POA: Insufficient documentation

## 2012-03-20 DIAGNOSIS — R10A0 Flank pain, unspecified side: Secondary | ICD-10-CM

## 2012-03-20 DIAGNOSIS — R197 Diarrhea, unspecified: Secondary | ICD-10-CM

## 2012-03-20 LAB — COMPREHENSIVE METABOLIC PANEL
ALT: 8 U/L (ref 0–35)
AST: 12 U/L (ref 0–37)
Albumin: 3.8 g/dL (ref 3.5–5.2)
Alkaline Phosphatase: 87 U/L (ref 39–117)
BUN: 11 mg/dL (ref 6–23)
CO2: 23 mEq/L (ref 19–32)
Calcium: 9.5 mg/dL (ref 8.4–10.5)
Chloride: 105 mEq/L (ref 96–112)
Creatinine, Ser: 0.65 mg/dL (ref 0.50–1.10)
GFR calc Af Amer: >90 mL/min (ref >90)
GFR calc non Af Amer: >90 mL/min (ref >90)
Glucose, Bld: 92 mg/dL (ref 70–99)
Potassium: 3.6 mEq/L (ref 3.5–5.1)
Sodium: 139 mEq/L (ref 135–145)
Total Bilirubin: 0.2 mg/dL — ABNORMAL LOW (ref 0.3–1.2)
Total Protein: 7.3 g/dL (ref 6.0–8.3)

## 2012-03-20 LAB — POCT I-STAT, CHEM 8
BUN: 10 mg/dL (ref 6–23)
Calcium, Ion: 1.22 mmol/L (ref 1.12–1.23)
Chloride: 109 mEq/L (ref 96–112)
Creatinine, Ser: 0.7 mg/dL (ref 0.50–1.10)
Glucose, Bld: 95 mg/dL (ref 70–99)
HCT: 40 % (ref 36.0–46.0)
Hemoglobin: 13.6 g/dL (ref 12.0–15.0)
Potassium: 3.6 mEq/L (ref 3.5–5.1)
Sodium: 141 mEq/L (ref 135–145)
TCO2: 23 mmol/L (ref 0–100)

## 2012-03-20 LAB — CBC
HCT: 39.4 % (ref 36.0–46.0)
Hemoglobin: 13.4 g/dL (ref 12.0–15.0)
MCH: 30.4 pg (ref 26.0–34.0)
MCHC: 34 g/dL (ref 30.0–36.0)
MCV: 89.3 fL (ref 78.0–100.0)
Platelets: 227 10*3/uL (ref 150–400)
RBC: 4.41 MIL/uL (ref 3.87–5.11)
RDW: 12.7 % (ref 11.5–15.5)
WBC: 13.4 10*3/uL — ABNORMAL HIGH (ref 4.0–10.5)

## 2012-03-20 LAB — LIPASE, BLOOD: Lipase: 31 U/L (ref 11–59)

## 2012-03-20 MED ORDER — ONDANSETRON HCL 4 MG/2ML IJ SOLN
4.0000 mg | Freq: Once | INTRAMUSCULAR | Status: AC
  Administered 2012-03-20: 4 mg via INTRAVENOUS
  Filled 2012-03-20: qty 2

## 2012-03-20 MED ORDER — SODIUM CHLORIDE 0.9 % IV BOLUS (SEPSIS)
1000.0000 mL | Freq: Once | INTRAVENOUS | Status: AC
  Administered 2012-03-20: 1000 mL via INTRAVENOUS

## 2012-03-20 MED ORDER — HYDROMORPHONE HCL PF 1 MG/ML IJ SOLN
1.0000 mg | Freq: Once | INTRAMUSCULAR | Status: AC
  Administered 2012-03-20: 1 mg via INTRAVENOUS
  Filled 2012-03-20: qty 1

## 2012-03-20 MED ORDER — ONDANSETRON HCL 4 MG PO TABS: 4.0000 mg | ORAL_TABLET | Freq: Four times a day (QID) | ORAL | Status: DC

## 2012-03-20 MED ORDER — HYDROCODONE-ACETAMINOPHEN 5-325 MG PO TABS: 2.0000 | ORAL_TABLET | ORAL | Status: DC | PRN

## 2012-03-20 NOTE — ED Notes (Signed)
Pt discharged.Vital signs stable and GCS 15 

## 2012-03-20 NOTE — ED Notes (Signed)
PT. REPORTS LEFT ABDOMINAL PAIN WITH DIARRHEA FOR 2 WEEKS AND VOMITTING THIS EVENING , DIAGNOSED WITH KIDNEY STONE AT [place] LAST WEEK.

## 2012-03-20 NOTE — ED Provider Notes (Signed)
History     CSN: [id number]  Arrival date & time 03/20/12  0407   First MD Initiated Contact with Patient 03/20/12 [phone]      Chief Complaint  Patient presents with  . Abdominal Pain    (Consider location/radiation/quality/duration/timing/severity/associated sxs/prior treatment) HPI Hx per PT. L sided upper ABD pain, radiates to back, nausea vomiting today and diarrhea for the last 2 weeks, h/o multiple kidney stones. Followed by [facility] in [place], no F/C, no etoh or h/o pancreatitis. No R sided pain, no trauma. States she always has blood in her urine 2/2 kidney stones. MOD in severity Past Medical History  Diagnosis Date  . Migraine   . Kidney stone   . Kidney stones   . Gallstones   . Kidney stone     Past Surgical History  Procedure Date  . Abdominal hysterectomy   . Tonsillectomy   . Lithotripsy   . Cholecystectomy 03/11/2011    Procedure: LAPAROSCOPIC CHOLECYSTECTOMY WITH INTRAOPERATIVE CHOLANGIOGRAM;  Surgeon: Atilano Ina, MD;  Location: [facility] OR;  Service: General;  Laterality: N/A;  . Cholecystectomy, laparoscopic   . Orif ankle fracture 12/25/2011    Procedure: OPEN REDUCTION INTERNAL FIXATION (ORIF) ANKLE FRACTURE;  Surgeon: Javier Docker, MD;  Location: [facility] ORS;  Service: Orthopedics;  Laterality: Left;  . Cast application 12/25/2011    Procedure: CAST APPLICATION;  Surgeon: Javier Docker, MD;  Location: [facility] ORS;  Service: Orthopedics;  Laterality: Left;    Family History  Problem Relation Age of Onset  . Diabetes Mother   . Diabetes Father   . Heart failure Father   . Hypertension Father   . Pulmonary fibrosis Father     History  Substance Use Topics  . Smoking status: Current Every Day Smoker -- 0.2 packs/day    Types: Cigarettes  . Smokeless tobacco: Not on file  . Alcohol Use: No     Comment: occaisional    OB History    Grav Para Term Preterm Abortions TAB SAB Ect Mult Living                  Review of Systems  Constitutional:  Negative for fever and chills.  HENT: Negative for neck pain and neck stiffness.   Eyes: Negative for pain.  Respiratory: Negative for shortness of breath.   Cardiovascular: Negative for chest pain.  Gastrointestinal: Positive for abdominal pain. Negative for blood in stool.  Genitourinary: Positive for flank pain. Negative for dysuria.  Musculoskeletal: Negative for back pain.  Skin: Negative for rash.  Neurological: Negative for headaches.  All other systems reviewed and are negative.    Allergies  Compazine and Metoclopramide hcl  Home Medications   Current Outpatient Rx  Name  Route  Sig  Dispense  Refill  . VITAMIN D 1000 UNITS PO TABS   Oral   Take 2,000 Units by mouth daily.          Marland Kitchen ESCITALOPRAM OXALATE 20 MG PO TABS   Oral   Take 20 mg by mouth at bedtime.          . OMEPRAZOLE 20 MG PO CPDR   Oral   Take 20 mg by mouth at bedtime.          Marland Kitchen PROMETHAZINE HCL 25 MG PO TABS   Oral   Take 25 mg by mouth every 6 (six) hours as needed. Nausea/vomiting.           BP 128/89  Pulse 102  Temp  98.2 F (36.8 C) (Oral)  Resp 14  SpO2 97%  Physical Exam  Constitutional: She is oriented to person, place, and time. She appears well-developed and well-nourished.  HENT:  Head: Normocephalic and atraumatic.  Eyes: EOM are normal. Pupils are equal, round, and reactive to light.  Neck: Normal range of motion. Neck supple.  Cardiovascular: Normal rate, regular rhythm and intact distal pulses.   Pulmonary/Chest: Effort normal and breath sounds normal. No respiratory distress.  Abdominal: Soft. Bowel sounds are normal. She exhibits no distension. There is no rebound and no guarding.       TTP LUQ  Musculoskeletal: Normal range of motion. She exhibits no edema.  Neurological: She is alert and oriented to person, place, and time.  Skin: Skin is warm and dry. No rash noted.    ED Course  Procedures (including critical care time)  Results for orders placed  during the hospital encounter of 03/20/12  CBC      Component Value Range   WBC 13.4 (*) 4.0 - 10.5 K/uL   RBC 4.41  3.87 - 5.11 MIL/uL   Hemoglobin 13.4  12.0 - 15.0 g/dL   HCT 16.1  09.6 - 04.5 %   MCV 89.3  78.0 - 100.0 fL   MCH 30.4  26.0 - 34.0 pg   MCHC 34.0  30.0 - 36.0 g/dL   RDW 40.9  81.1 - 91.4 %   Platelets 227  150 - 400 K/uL  COMPREHENSIVE METABOLIC PANEL      Component Value Range   Sodium 139  135 - 145 mEq/L   Potassium 3.6  3.5 - 5.1 mEq/L   Chloride 105  96 - 112 mEq/L   CO2 23  19 - 32 mEq/L   Glucose, Bld 92  70 - 99 mg/dL   BUN 11  6 - 23 mg/dL   Creatinine, Ser 7.82  0.50 - 1.10 mg/dL   Calcium 9.5  8.4 - 95.6 mg/dL   Total Protein 7.3  6.0 - 8.3 g/dL   Albumin 3.8  3.5 - 5.2 g/dL   AST 12  0 - 37 U/L   ALT 8  0 - 35 U/L   Alkaline Phosphatase 87  39 - 117 U/L   Total Bilirubin 0.2 (*) 0.3 - 1.2 mg/dL   GFR calc non Af Amer >90  >90 mL/min   GFR calc Af Amer >90  >90 mL/min  LIPASE, BLOOD      Component Value Range   Lipase 31  11 - 59 U/L  POCT I-STAT, CHEM 8      Component Value Range   Sodium 141  135 - 145 mEq/L   Potassium 3.6  3.5 - 5.1 mEq/L   Chloride 109  96 - 112 mEq/L   BUN 10  6 - 23 mg/dL   Creatinine, Ser 2.13  0.50 - 1.10 mg/dL   Glucose, Bld 95  70 - 99 mg/dL   Calcium, Ion 0.86  5.78 - 1.23 mmol/L   TCO2 23  0 - 100 mmol/L   Hemoglobin 13.6  12.0 - 15.0 g/dL   HCT 46.9  62.9 - 52.8 %    Dg Abd 1 View  03/12/2012  *RADIOLOGY REPORT*  Clinical Data: Left flank pain.  History of kidney stones.  ABDOMEN - 1 VIEW  Comparison: CT abdomen 12/17/2011.  Abdominal radiographs 08/04/2011.  Findings: 3 mm calculus projected over the mid pole of the left kidney appears stable in size and position since previous  study. No additional stones are demonstrated.  Scattered gas and stool in the colon.  No small or large bowel distension.  Surgical clips in the right upper quadrant.  Visualized bones appear intact.  No significant change since  previous study.  IMPRESSION: Stable appearance of 3 mm calculus projected over the left kidney. No additional stones.  Normal bowel gas pattern.   Original Report Authenticated By: Burman Nieves, M.D.    IVFs and IV DIlaudid   7:26 AM recheck feeling much better and wants to go home, has Urology and PCP follow up. GI referral provided. PT wants to be discharged home, no peritonitis and no indication for admit or further imaging at this time.   MDM  Flank pain and diarrhea evaluated with labs, treated IV narcotics and IV Fluids. iproved condition. VS and nursing notes reviewed   Known kidney stones      Sunnie Nielsen, MD 03/20/12 [phone]

## 2012-03-23 LAB — STOOL CULTURE

## 2012-04-11 ENCOUNTER — Other Ambulatory Visit: Payer: Self-pay

## 2012-05-18 ENCOUNTER — Encounter (HOSPITAL_COMMUNITY): Payer: Self-pay | Admitting: *Deleted

## 2012-05-18 ENCOUNTER — Emergency Department (HOSPITAL_COMMUNITY): Payer: BC Managed Care – PPO

## 2012-05-18 ENCOUNTER — Emergency Department (HOSPITAL_COMMUNITY)
Admission: EM | Admit: 2012-05-18 | Discharge: 2012-05-18 | Disposition: A | Discharge status: Home / Self Care | Payer: BC Managed Care – PPO | Attending: Emergency Medicine | Admitting: Emergency Medicine

## 2012-05-18 DIAGNOSIS — Z87442 Personal history of urinary calculi: Secondary | ICD-10-CM | POA: Insufficient documentation

## 2012-05-18 DIAGNOSIS — Z9089 Acquired absence of other organs: Secondary | ICD-10-CM | POA: Insufficient documentation

## 2012-05-18 DIAGNOSIS — F17200 Nicotine dependence, unspecified, uncomplicated: Secondary | ICD-10-CM | POA: Insufficient documentation

## 2012-05-18 DIAGNOSIS — R110 Nausea: Secondary | ICD-10-CM

## 2012-05-18 DIAGNOSIS — Z79899 Other long term (current) drug therapy: Secondary | ICD-10-CM | POA: Insufficient documentation

## 2012-05-18 DIAGNOSIS — R319 Hematuria, unspecified: Secondary | ICD-10-CM

## 2012-05-18 DIAGNOSIS — Z8679 Personal history of other diseases of the circulatory system: Secondary | ICD-10-CM | POA: Insufficient documentation

## 2012-05-18 DIAGNOSIS — Z90710 Acquired absence of both cervix and uterus: Secondary | ICD-10-CM | POA: Insufficient documentation

## 2012-05-18 DIAGNOSIS — Z8719 Personal history of other diseases of the digestive system: Secondary | ICD-10-CM | POA: Insufficient documentation

## 2012-05-18 DIAGNOSIS — N23 Unspecified renal colic: Secondary | ICD-10-CM | POA: Insufficient documentation

## 2012-05-18 LAB — COMPREHENSIVE METABOLIC PANEL
ALT: 9 U/L (ref 0–35)
AST: 14 U/L (ref 0–37)
Albumin: 4 g/dL (ref 3.5–5.2)
Alkaline Phosphatase: 92 U/L (ref 39–117)
BUN: 11 mg/dL (ref 6–23)
CO2: 22 mEq/L (ref 19–32)
Calcium: 9.4 mg/dL (ref 8.4–10.5)
Chloride: 107 mEq/L (ref 96–112)
Creatinine, Ser: 0.65 mg/dL (ref 0.50–1.10)
GFR calc Af Amer: >90 mL/min (ref >90)
GFR calc non Af Amer: >90 mL/min (ref >90)
Glucose, Bld: 95 mg/dL (ref 70–99)
Potassium: 3.7 mEq/L (ref 3.5–5.1)
Sodium: 142 mEq/L (ref 135–145)
Total Bilirubin: 0.3 mg/dL (ref 0.3–1.2)
Total Protein: 7.4 g/dL (ref 6.0–8.3)

## 2012-05-18 LAB — CBC WITH DIFFERENTIAL/PLATELET
Basophils Absolute: 0.1 10*3/uL (ref 0.0–0.1)
Basophils Relative: 1 % (ref 0–1)
Eosinophils Absolute: 0.2 10*3/uL (ref 0.0–0.7)
Eosinophils Relative: 2 % (ref 0–5)
HCT: 39.8 % (ref 36.0–46.0)
Hemoglobin: 14 g/dL (ref 12.0–15.0)
Lymphocytes Relative: 37 % (ref 12–46)
Lymphs Abs: 4.3 10*3/uL — ABNORMAL HIGH (ref 0.7–4.0)
MCH: 30.5 pg (ref 26.0–34.0)
MCHC: 35.2 g/dL (ref 30.0–36.0)
MCV: 86.7 fL (ref 78.0–100.0)
Monocytes Absolute: 0.7 10*3/uL (ref 0.1–1.0)
Monocytes Relative: 6 % (ref 3–12)
Neutro Abs: 6.6 10*3/uL (ref 1.7–7.7)
Neutrophils Relative %: 56 % (ref 43–77)
Platelets: 243 10*3/uL (ref 150–400)
RBC: 4.59 MIL/uL (ref 3.87–5.11)
RDW: 13.1 % (ref 11.5–15.5)
WBC: 11.8 10*3/uL — ABNORMAL HIGH (ref 4.0–10.5)

## 2012-05-18 LAB — URINALYSIS, MICROSCOPIC ONLY
Bilirubin Urine: NEGATIVE
Glucose, UA: NEGATIVE mg/dL
Ketones, ur: 15 mg/dL — AB
Leukocytes, UA: NEGATIVE
Nitrite: NEGATIVE
Protein, ur: NEGATIVE mg/dL
Specific Gravity, Urine: 1.022 (ref 1.005–1.030)
Urobilinogen, UA: 0.2 mg/dL (ref 0.0–1.0)
pH: 5.5 (ref 5.0–8.0)

## 2012-05-18 MED ORDER — IBUPROFEN 600 MG PO TABS: 600.0000 mg | ORAL_TABLET | Freq: Four times a day (QID) | ORAL | Status: DC | PRN

## 2012-05-18 MED ORDER — TAMSULOSIN HCL 0.4 MG PO CAPS: 0.4000 mg | ORAL_CAPSULE | Freq: Every day | ORAL | Status: DC

## 2012-05-18 MED ORDER — HYDROMORPHONE HCL PF 1 MG/ML IJ SOLN
1.0000 mg | INTRAMUSCULAR | Status: AC
  Administered 2012-05-18: 1 mg via INTRAVENOUS
  Filled 2012-05-18: qty 1

## 2012-05-18 MED ORDER — PROMETHAZINE HCL 25 MG PO TABS: 25.0000 mg | ORAL_TABLET | Freq: Four times a day (QID) | ORAL | Status: DC | PRN

## 2012-05-18 MED ORDER — TAMSULOSIN HCL 0.4 MG PO CAPS
0.4000 mg | ORAL_CAPSULE | Freq: Once | ORAL | Status: AC
  Administered 2012-05-18: 0.4 mg via ORAL
  Filled 2012-05-18: qty 1

## 2012-05-18 MED ORDER — OXYCODONE-ACETAMINOPHEN 5-325 MG PO TABS: 1.0000 | ORAL_TABLET | Freq: Four times a day (QID) | ORAL | Status: DC | PRN

## 2012-05-18 MED ORDER — ONDANSETRON HCL 4 MG/2ML IJ SOLN
INTRAMUSCULAR | Status: AC
  Administered 2012-05-18: 4 mg
  Filled 2012-05-18: qty 2

## 2012-05-18 MED ORDER — ONDANSETRON HCL 4 MG/2ML IJ SOLN
4.0000 mg | Freq: Once | INTRAMUSCULAR | Status: AC
  Administered 2012-05-18: 4 mg via INTRAVENOUS
  Filled 2012-05-18: qty 2

## 2012-05-18 MED ORDER — HYDROMORPHONE HCL PF 1 MG/ML IJ SOLN
0.5000 mg | INTRAMUSCULAR | Status: AC
  Administered 2012-05-18: 0.5 mg via INTRAVENOUS
  Filled 2012-05-18: qty 1

## 2012-05-18 MED ORDER — KETOROLAC TROMETHAMINE 30 MG/ML IJ SOLN
30.0000 mg | Freq: Once | INTRAMUSCULAR | Status: AC
  Administered 2012-05-18: 30 mg via INTRAVENOUS
  Filled 2012-05-18: qty 1

## 2012-05-18 NOTE — ED Provider Notes (Signed)
History     CSN: [id number]  Arrival date & time 05/18/12  0416   First MD Initiated Contact with Patient 05/18/12 [phone]      Chief Complaint  Patient presents with  . Abdominal Pain    (Consider location/radiation/quality/duration/timing/severity/associated sxs/prior treatment) HPI [name] is a 32 y.o. female presenting with left flank pain is recently migrated into the left lower quadrant. Patient has a history of kidney stones and says this feels like a kidney stone. Pain has been going on for a couple of days, but has been intermittent and it self resolved up until this morning. Patient's pain started in the left flank and is now mostly left lower quadrant. This left lower quadrant pain started about 2:00 in the morning, he woke her from sleep, it is difficult for her to get comfortable pacing is helped. She has not taken anything for it, patient's pain is severe, associated with nausea, comes and goes in waves but is always constant. Denies any dysuria, frequency or urgency. No vaginal discharge, no fevers, chills, no abdominal pain, no diarrhea, no chest pain or shortness of breath.   Past Medical History  Diagnosis Date  . Migraine   . Kidney stone   . Kidney stones   . Gallstones   . Kidney stone     Past Surgical History  Procedure Laterality Date  . Abdominal hysterectomy    . Tonsillectomy    . Lithotripsy    . Cholecystectomy  03/11/2011    Procedure: LAPAROSCOPIC CHOLECYSTECTOMY WITH INTRAOPERATIVE CHOLANGIOGRAM;  Surgeon: Atilano Ina, MD;  Location: [facility] OR;  Service: General;  Laterality: N/A;  . Cholecystectomy, laparoscopic    . Orif ankle fracture  12/25/2011    Procedure: OPEN REDUCTION INTERNAL FIXATION (ORIF) ANKLE FRACTURE;  Surgeon: Javier Docker, MD;  Location: [facility];  Service: Orthopedics;  Laterality: Left;  . Cast application  12/25/2011    Procedure: CAST APPLICATION;  Surgeon: Javier Docker, MD;  Location: [facility] OR[facility];  Service: Orthopedics;   Laterality: Left;    Family History  Problem Relation Age of Onset  . Diabetes Mother   . Diabetes Father   . Heart failure Father   . Hypertension Father   . Pulmonary fibrosis Father     History  Substance Use Topics  . Smoking status: Current Every Day Smoker -- 0.25 packs/day    Types: Cigarettes  . Smokeless tobacco: Not on file  . Alcohol Use: No     Comment: occaisional    OB History   Grav Para Term Preterm Abortions TAB SAB Ect Mult Living                  Review of Systems At least 10pt or greater review of systems completed and are negative except where specified in the HPI.  Allergies  Compazine and Metoclopramide hcl  Home Medications   Current Outpatient Rx  Name  Route  Sig  Dispense  Refill  . cholecalciferol (VITAMIN D) 1000 UNITS tablet   Oral   Take 2,000 Units by mouth daily.          Marland Kitchen escitalopram (LEXAPRO) 20 MG tablet   Oral   Take 20 mg by mouth at bedtime.          Marland Kitchen omeprazole (PRILOSEC) 20 MG capsule   Oral   Take 20 mg by mouth at bedtime.            BP 116/65  Pulse 100  Temp(Src)  97.7 F (36.5 C) (Oral)  Resp 20  SpO2 97%  Physical Exam  Nursing notes reviewed.  Electronic medical record reviewed. VITAL SIGNS:   Filed Vitals:   05/18/12 0421 05/18/12 0424  BP: 116/65 116/65  Pulse: 79 100  Temp: 97.7 F (36.5 C) 97.7 F (36.5 C)  TempSrc: Oral Oral  Resp: 16 20  SpO2: 98% 97%   CONSTITUTIONAL: Awake, oriented, appears non-toxic HENT: Atraumatic, normocephalic, oral mucosa pink and moist, airway patent. Nares patent without drainage. External ears normal. EYES: Conjunctiva clear, EOMI, PERRLA NECK: Trachea midline, non-tender, supple CARDIOVASCULAR: Normal heart rate, Normal rhythm, No murmurs, rubs, gallops PULMONARY/CHEST: Clear to auscultation, no rhonchi, wheezes, or rales. Symmetrical breath sounds. Non-tender. ABDOMINAL: Non-distended, soft, non-tender - no rebound or guarding.  BS  normal. NEUROLOGIC: Non-focal, moving all four extremities, no gross sensory or motor deficits. EXTREMITIES: No clubbing, cyanosis, or edema SKIN: Warm, Dry, No erythema, No rash   ED Course  [place] bedside Date/Time: 05/18/2012 5:44 AM Performed by: Jones Skene Authorized by: Jones Skene Comments: Left bedside kidney ultrasound appears normal   (including critical care time)  Labs Reviewed  CBC WITH DIFFERENTIAL - Abnormal; Notable for the following:    WBC 11.8 (*)    Lymphs Abs 4.3 (*)    All other components within normal limits  URINALYSIS, MICROSCOPIC ONLY - Abnormal; Notable for the following:    Color, Urine AMBER (*)    APPearance CLOUDY (*)    Hgb urine dipstick LARGE (*)    Ketones, ur 15 (*)    Squamous Epithelial / LPF FEW (*)    All other components within normal limits  COMPREHENSIVE METABOLIC PANEL   No results found.   No diagnosis found.    MDM  [name] is a 32 y.o. female with a history of nephrolithiasis presents with left flank now left lower quadrant pain she says this is reminiscent of prior stones.  Left kidney appears normal on bedside ultrasound perhaps mild hydronephrosis or hydroureter.  Reviewing prior records, she had a CT done in October of last year showing bilateral nonobstructing renal stones, on my measurement of the stones the largest appeared to be about 4 mm. We'll obtain a KUB looking for kidney stones hoping to hold off on a CT to reduce or radiation exposure. We'll treat her pain aggressively. Patient's labs consistent with left-sided ureteral colic/nephrolithiasis with blood in the urine, no apparent urinary tract infection, LFTs appear normal. I do not suspect any biliary abnormalities, do not suspect pancreatitis, perforated viscus, peptic ulcer disease, appendicitis. Patient is status post hysterectomy.  Urinalysis with microscopic hematuria, labs are unremarkable. No definitive stone seen on KUB. Patient is feeling much  better after medication. Bedside ultrasound reveals no hydronephrosis.  Discharge the patient home stable and in good condition with urology followup-she is followed by a [facility] and has seen in the past.  I explained the diagnosis and have given explicit precautions to return to the ER including any other new or worsening symptoms including fever, intractable vomiting. The patient understands and accepts the medical plan as it's been dictated and I have answered their questions. Discharge instructions concerning home care and prescriptions have been given.  The patient is STABLE and is discharged to home in good condition.       Jones Skene, MD 05/18/12 [phone]

## 2012-05-18 NOTE — ED Notes (Signed)
Pt c/o LLQ pain since yesterday morning, pain has been intermittent and woke her up from sleep at 2 am this morning.  C/o nausea, no vomiting or diarrhea.

## 2012-05-29 ENCOUNTER — Encounter (HOSPITAL_COMMUNITY): Payer: Self-pay | Admitting: *Deleted

## 2012-05-29 ENCOUNTER — Emergency Department (HOSPITAL_COMMUNITY)
Admission: EM | Admit: 2012-05-29 | Discharge: 2012-05-29 | Disposition: A | Discharge status: Home / Self Care | Payer: BC Managed Care – PPO | Attending: Emergency Medicine | Admitting: Emergency Medicine

## 2012-05-29 ENCOUNTER — Emergency Department (HOSPITAL_COMMUNITY): Payer: BC Managed Care – PPO

## 2012-05-29 DIAGNOSIS — F17200 Nicotine dependence, unspecified, uncomplicated: Secondary | ICD-10-CM | POA: Insufficient documentation

## 2012-05-29 DIAGNOSIS — Z8679 Personal history of other diseases of the circulatory system: Secondary | ICD-10-CM | POA: Insufficient documentation

## 2012-05-29 DIAGNOSIS — Z79899 Other long term (current) drug therapy: Secondary | ICD-10-CM | POA: Insufficient documentation

## 2012-05-29 DIAGNOSIS — Z87442 Personal history of urinary calculi: Secondary | ICD-10-CM

## 2012-05-29 DIAGNOSIS — R109 Unspecified abdominal pain: Secondary | ICD-10-CM

## 2012-05-29 DIAGNOSIS — R110 Nausea: Secondary | ICD-10-CM | POA: Insufficient documentation

## 2012-05-29 DIAGNOSIS — R319 Hematuria, unspecified: Secondary | ICD-10-CM | POA: Insufficient documentation

## 2012-05-29 DIAGNOSIS — N23 Unspecified renal colic: Secondary | ICD-10-CM

## 2012-05-29 LAB — CBC WITH DIFFERENTIAL/PLATELET
Basophils Absolute: 0.1 10*3/uL (ref 0.0–0.1)
Basophils Relative: 1 % (ref 0–1)
Eosinophils Absolute: 0.2 10*3/uL (ref 0.0–0.7)
Eosinophils Relative: 2 % (ref 0–5)
HCT: 39.7 % (ref 36.0–46.0)
Hemoglobin: 13.5 g/dL (ref 12.0–15.0)
Lymphocytes Relative: 36 % (ref 12–46)
Lymphs Abs: 3.4 10*3/uL (ref 0.7–4.0)
MCH: 30.3 pg (ref 26.0–34.0)
MCHC: 34 g/dL (ref 30.0–36.0)
MCV: 89 fL (ref 78.0–100.0)
Monocytes Absolute: 0.6 10*3/uL (ref 0.1–1.0)
Monocytes Relative: 6 % (ref 3–12)
Neutro Abs: 5.3 10*3/uL (ref 1.7–7.7)
Neutrophils Relative %: 56 % (ref 43–77)
Platelets: 243 10*3/uL (ref 150–400)
RBC: 4.46 MIL/uL (ref 3.87–5.11)
RDW: 13.3 % (ref 11.5–15.5)
WBC: 9.6 10*3/uL (ref 4.0–10.5)

## 2012-05-29 LAB — COMPREHENSIVE METABOLIC PANEL
ALT: 11 U/L (ref 0–35)
AST: 17 U/L (ref 0–37)
Albumin: 4.2 g/dL (ref 3.5–5.2)
Alkaline Phosphatase: 96 U/L (ref 39–117)
BUN: 10 mg/dL (ref 6–23)
CO2: 25 mEq/L (ref 19–32)
Calcium: 10.1 mg/dL (ref 8.4–10.5)
Chloride: 102 mEq/L (ref 96–112)
Creatinine, Ser: 0.69 mg/dL (ref 0.50–1.10)
GFR calc Af Amer: >90 mL/min (ref >90)
GFR calc non Af Amer: >90 mL/min (ref >90)
Glucose, Bld: 90 mg/dL (ref 70–99)
Potassium: 3.9 mEq/L (ref 3.5–5.1)
Sodium: 138 mEq/L (ref 135–145)
Total Bilirubin: 0.5 mg/dL (ref 0.3–1.2)
Total Protein: 7.9 g/dL (ref 6.0–8.3)

## 2012-05-29 LAB — URINALYSIS, MICROSCOPIC ONLY
Glucose, UA: NEGATIVE mg/dL
Ketones, ur: 15 mg/dL — AB
Nitrite: NEGATIVE
Protein, ur: 30 mg/dL — AB
Specific Gravity, Urine: 1.031 — ABNORMAL HIGH (ref 1.005–1.030)
Urobilinogen, UA: 0.2 mg/dL (ref 0.0–1.0)
pH: 6 (ref 5.0–8.0)

## 2012-05-29 LAB — LIPASE, BLOOD: Lipase: 24 U/L (ref 11–59)

## 2012-05-29 MED ORDER — OXYCODONE-ACETAMINOPHEN 5-325 MG PO TABS
2.0000 | ORAL_TABLET | Freq: Once | ORAL | Status: AC
  Administered 2012-05-29: 2 via ORAL
  Filled 2012-05-29: qty 2

## 2012-05-29 MED ORDER — HYDROMORPHONE HCL PF 1 MG/ML IJ SOLN
1.0000 mg | Freq: Once | INTRAMUSCULAR | Status: AC
  Administered 2012-05-29: 1 mg via INTRAVENOUS
  Filled 2012-05-29: qty 1

## 2012-05-29 MED ORDER — OXYCODONE-ACETAMINOPHEN 5-325 MG PO TABS: 1.0000 | ORAL_TABLET | Freq: Four times a day (QID) | ORAL | Status: DC | PRN

## 2012-05-29 MED ORDER — CIPROFLOXACIN HCL 250 MG PO TABS: 250.0000 mg | ORAL_TABLET | Freq: Two times a day (BID) | ORAL | Status: DC

## 2012-05-29 NOTE — ED Provider Notes (Signed)
History     CSN: [id number]  Arrival date & time 05/29/12  1537   First MD Initiated Contact with Patient 05/29/12 1817      Chief Complaint  Patient presents with  . Abdominal Pain    (Consider location/radiation/quality/duration/timing/severity/associated sxs/prior treatment) HPI Comments: Patient is a 32 y/o F with PMHx of kidney stones, gallstones, migraines presenting to the ED with left sided abdominal pain x 10 days. Patient stated that she was recently in the ED on 05/18/2012 for kidney stones - she was discharged and given Percocets for the pain and Promethazine for the nausea that she has been using. Patient stated that she was fine until Wednesday when the left sided abdominal pain increased. Patient reported that it is a severe, sharp, constant pain with radiation to the left flank - denied radiation to the legs, pelvic region, groin. Patient reported that she feels better when pacing back and forth. Associated symptoms are nausea, hematuria, and decreased appetite. Denied fever, chills, shortness of breathe, chest pain, difficulty breathing, vomiting, diarrhea, neck pain, dysphagia, dysuria.    Patient was unable to schedule an appointment with urologist that is why she came to the ED. Reviewed patient's chart - has a history of nephrolithiasis - was recently seen on 05/18/2012 for renal colic.  Patient reported having lithotripsy and left ureter stent placed in 2012. Patient s/p hysterectomy in 2006.   The history is provided by the patient. No language interpreter was used.    Past Medical History  Diagnosis Date  . Migraine   . Kidney stone   . Kidney stones   . Gallstones   . Kidney stone     Past Surgical History  Procedure Laterality Date  . Abdominal hysterectomy    . Tonsillectomy    . Lithotripsy    . Cholecystectomy  03/11/2011    Procedure: LAPAROSCOPIC CHOLECYSTECTOMY WITH INTRAOPERATIVE CHOLANGIOGRAM;  Surgeon: Atilano Ina, MD;  Location: [facility] OR;   Service: General;  Laterality: N/A;  . Cholecystectomy, laparoscopic    . Orif ankle fracture  12/25/2011    Procedure: OPEN REDUCTION INTERNAL FIXATION (ORIF) ANKLE FRACTURE;  Surgeon: Javier Docker, MD;  Location: [facility] ORS;  Service: Orthopedics;  Laterality: Left;  . Cast application  12/25/2011    Procedure: CAST APPLICATION;  Surgeon: Javier Docker, MD;  Location: [facility] ORS;  Service: Orthopedics;  Laterality: Left;    Family History  Problem Relation Age of Onset  . Diabetes Mother   . Diabetes Father   . Heart failure Father   . Hypertension Father   . Pulmonary fibrosis Father     History  Substance Use Topics  . Smoking status: Current Every Day Smoker -- 0.25 packs/day    Types: Cigarettes  . Smokeless tobacco: Not on file  . Alcohol Use: No     Comment: occaisional    OB History   Grav Para Term Preterm Abortions TAB SAB Ect Mult Living                  Review of Systems  Constitutional: Negative for fever and chills.  HENT: Negative for sore throat, trouble swallowing and neck pain.   Eyes: Negative for visual disturbance.  Respiratory: Negative for chest tightness and shortness of breath.   Cardiovascular: Negative for chest pain and leg swelling.  Gastrointestinal: Positive for nausea and abdominal pain. Negative for vomiting and diarrhea.       Lower left abdominal region  Genitourinary: Positive for hematuria and  flank pain. Negative for vaginal bleeding, vaginal discharge and vaginal pain.       Left sided flank pain  Musculoskeletal: Negative for back pain.  Skin: Negative for color change.  Neurological: Negative for dizziness, light-headedness, numbness and headaches.  All other systems reviewed and are negative.    Allergies  Compazine and Metoclopramide hcl  Home Medications   Current Outpatient Rx  Name  Route  Sig  Dispense  Refill  . cholecalciferol (VITAMIN D) 1000 UNITS tablet   Oral   Take 2,000 Units by mouth daily.          Marland Kitchen  escitalopram (LEXAPRO) 20 MG tablet   Oral   Take 20 mg by mouth at bedtime.          Marland Kitchen ibuprofen (ADVIL,MOTRIN) 600 MG tablet   Oral   Take 1 tablet (600 mg total) by mouth every 6 (six) hours as needed for pain.   30 tablet   0   . omeprazole (PRILOSEC) 20 MG capsule   Oral   Take 20 mg by mouth at bedtime.          Marland Kitchen oxyCODONE-acetaminophen (PERCOCET/ROXICET) 5-325 MG per tablet   Oral   Take 1-2 tablets by mouth every 6 (six) hours as needed for pain.   23 tablet   0   . promethazine (PHENERGAN) 25 MG tablet   Oral   Take 1 tablet (25 mg total) by mouth every 6 (six) hours as needed for nausea.   30 tablet   0   . ciprofloxacin (CIPRO) 250 MG tablet   Oral   Take 1 tablet (250 mg total) by mouth 2 (two) times daily.   10 tablet   0   . oxyCODONE-acetaminophen (PERCOCET) 5-325 MG per tablet   Oral   Take 1 tablet by mouth every 6 (six) hours as needed for pain.   10 tablet   0     BP 108/63  Pulse 55  Temp(Src) 98.2 F (36.8 C) (Oral)  Resp 16  SpO2 98%  Physical Exam  Nursing note and vitals reviewed. Constitutional: She is oriented to person, place, and time. She appears well-developed and well-nourished. No distress.  HENT:  Head: Normocephalic and atraumatic.  Eyes: Conjunctivae and EOM are normal. Pupils are equal, round, and reactive to light. Right eye exhibits no discharge. Left eye exhibits no discharge.  Neck: Normal range of motion. Neck supple. No tracheal deviation present.  Cardiovascular: Normal rate, regular rhythm, normal heart sounds and intact distal pulses.  Exam reveals no friction rub.   No murmur heard. Pulmonary/Chest: Effort normal and breath sounds normal. No respiratory distress. She has no wheezes. She has no rales. She exhibits no tenderness.  Abdominal: Soft. Bowel sounds are normal. She exhibits no distension. There is tenderness. There is no rebound.  Tenderness upon palpation to the LLQ and left pelvic region.     Genitourinary:  Positive for CVA tenderness to the left flank.   Lymphadenopathy:    She has no cervical adenopathy.  Neurological: She is alert and oriented to person, place, and time. No cranial nerve deficit. She exhibits normal muscle tone. Coordination normal.  Skin: Skin is warm and dry. No rash noted. She is not diaphoretic. No erythema.  Psychiatric: She has a normal mood and affect. Her behavior is normal. Thought content normal.    ED Course  Procedures (including critical care time)  Labs Reviewed  URINALYSIS, MICROSCOPIC ONLY - Abnormal; Notable for the following:  APPearance CLOUDY (*)    Specific Gravity, Urine 1.031 (*)    Hgb urine dipstick LARGE (*)    Bilirubin Urine SMALL (*)    Ketones, ur 15 (*)    Protein, ur 30 (*)    Leukocytes, UA TRACE (*)    Bacteria, UA MANY (*)    Squamous Epithelial / LPF FEW (*)    All other components within normal limits  URINE CULTURE  COMPREHENSIVE METABOLIC PANEL  LIPASE, BLOOD  CBC WITH DIFFERENTIAL   US Renal  05/29/2012  *RADIOLOGY REPORT*  Clinical Data: Left lower quadrant abdominal pain radiating into left back.  History of renal calculi.  Urinary tract infection.  RENAL/URINARY TRACT ULTRASOUND COMPLETE  Comparison:  Several abdominal CT studies from 2013 as well as a prior renal ultrasound on 08/04/2011.  Findings:  Right Kidney:  11 cm in length.  No evidence of obstruction, focal lesion or shadowing calculus.  Left Kidney:  11.7 cm in length.  Suggestion of a small nonobstructing calculus in the lower pole measuring roughly 4 mm. No hydronephrosis or focal lesion.  Bladder:  Bladder is nondistended.  IMPRESSION: No evidence of hydronephrosis.  Probable 4 mm nonobstructing calculus identified in the lower left kidney.   Original Report Authenticated By: Irish Lack, M.D.    Results for orders placed during the hospital encounter of 05/29/12  COMPREHENSIVE METABOLIC PANEL      Result Value Range   Sodium 138  135 -  145 mEq/L   Potassium 3.9  3.5 - 5.1 mEq/L   Chloride 102  96 - 112 mEq/L   CO2 25  19 - 32 mEq/L   Glucose, Bld 90  70 - 99 mg/dL   BUN 10  6 - 23 mg/dL   Creatinine, Ser 1.61  0.50 - 1.10 mg/dL   Calcium 09.6  8.4 - 04.5 mg/dL   Total Protein 7.9  6.0 - 8.3 g/dL   Albumin 4.2  3.5 - 5.2 g/dL   AST 17  0 - 37 U/L   ALT 11  0 - 35 U/L   Alkaline Phosphatase 96  39 - 117 U/L   Total Bilirubin 0.5  0.3 - 1.2 mg/dL   GFR calc non Af Amer >90  >90 mL/min   GFR calc Af Amer >90  >90 mL/min  LIPASE, BLOOD      Result Value Range   Lipase 24  11 - 59 U/L  URINALYSIS, MICROSCOPIC ONLY      Result Value Range   Color, Urine YELLOW  YELLOW   APPearance CLOUDY (*) CLEAR   Specific Gravity, Urine 1.031 (*) 1.005 - 1.030   pH 6.0  5.0 - 8.0   Glucose, UA NEGATIVE  NEGATIVE mg/dL   Hgb urine dipstick LARGE (*) NEGATIVE   Bilirubin Urine SMALL (*) NEGATIVE   Ketones, ur 15 (*) NEGATIVE mg/dL   Protein, ur 30 (*) NEGATIVE mg/dL   Urobilinogen, UA 0.2  0.0 - 1.0 mg/dL   Nitrite NEGATIVE  NEGATIVE   Leukocytes, UA TRACE (*) NEGATIVE   WBC, UA 0-2  <3 WBC/hpf   RBC / HPF 3-6  <3 RBC/hpf   Bacteria, UA MANY (*) RARE   Squamous Epithelial / LPF FEW (*) RARE   Urine-Other LESS THAN 10 mL OF URINE SUBMITTED    CBC WITH DIFFERENTIAL      Result Value Range   WBC 9.6  4.0 - 10.5 K/uL   RBC 4.46  3.87 - 5.11 MIL/uL  Hemoglobin 13.5  12.0 - 15.0 g/dL   HCT 16.1  09.6 - 04.5 %   MCV 89.0  78.0 - 100.0 fL   MCH 30.3  26.0 - 34.0 pg   MCHC 34.0  30.0 - 36.0 g/dL   RDW 40.9  81.1 - 91.4 %   Platelets 243  150 - 400 K/uL   Neutrophils Relative 56  43 - 77 %   Neutro Abs 5.3  1.7 - 7.7 K/uL   Lymphocytes Relative 36  12 - 46 %   Lymphs Abs 3.4  0.7 - 4.0 K/uL   Monocytes Relative 6  3 - 12 %   Monocytes Absolute 0.6  0.1 - 1.0 K/uL   Eosinophils Relative 2  0 - 5 %   Eosinophils Absolute 0.2  0.0 - 0.7 K/uL   Basophils Relative 1  0 - 1 %   Basophils Absolute 0.1  0.0 - 0.1 K/uL    US  Renal  05/29/2012  *RADIOLOGY REPORT*  Clinical Data: Left lower quadrant abdominal pain radiating into left back.  History of renal calculi.  Urinary tract infection.  RENAL/URINARY TRACT ULTRASOUND COMPLETE  Comparison:  Several abdominal CT studies from 2013 as well as a prior renal ultrasound on 08/04/2011.  Findings:  Right Kidney:  11 cm in length.  No evidence of obstruction, focal lesion or shadowing calculus.  Left Kidney:  11.7 cm in length.  Suggestion of a small nonobstructing calculus in the lower pole measuring roughly 4 mm. No hydronephrosis or focal lesion.  Bladder:  Bladder is nondistended.  IMPRESSION: No evidence of hydronephrosis.  Probable 4 mm nonobstructing calculus identified in the lower left kidney.   Original Report Authenticated By: Irish Lack, M.D.        1. Renal colic   2. Abdominal pain   3. History of kidney stones       MDM  Patient is a 32 y/o F with PMHx for kidney stones, gallstones, migraines presenting to the ED with left lower abdominal pain radiating to the left flank- associated symptoms of hematuria, decreased appetite, nausea. Denied vomiting, diarrhea, fever, chills. I personally evaluated and examined the patient.  CBC negative CMP negative findings Lipase within normal limits (24) UA large amount of Hgb, high specific gravity (1.031), proteins positive (30), ketones positive (15), leukocytes trace, many bacteria found.  Urine culture pending Renal ultrasound: no evidence of hydronephrosis - probably 4 mm non-obstructing calculus identified in the lower left kidney.  IV Dilaudid given for pain - mild relief Percocets PO given for pain  Discussed case with Dr. Effie Shy, recommended patient to be discharged with pain medication and to follow-up with Urology on Monday.   Patient is afebrile, normotensive, non-tachycardic, alert and oriented. Patient aseptic, non-toxic appearing. Patient has no evidence of hydronephrosis - 4 mm non-obstructing  calculus noted in the lower left kidney. Chart reviewed - 11/2011 CT Abdomen and Pelvis performed where bilateral non-obstructing kidney stones found - chronic kidney stones. Managed patient's pain in ED. No episodes of vomiting while in ED. Urine negative for nitrites, positive for bacteria - discharged patient with antibiotics and pain medication. Stressed to patient to continue staying hydrated and to follow-up with [facility] on Monday regarding reoccurring condition and recent visit to the ED. Instructed patient on how to take medications - discussed that while taking pain medications to not drive, drink or operate heavy machinery. Discussed with patient that if symptoms worsen to please report back to the ED. Patient  agreed to plan of care, understood, all questions answered.            Raymon Mutton, PA-C 05/29/12 2302

## 2012-05-29 NOTE — ED Notes (Signed)
Pt c/o left lower quadrant pain described as cramping in nature that radiates to left side of back x 2 days. Pt has hx of kidney stones and was seen a week and a half ago for same issue. Pt states she has been unable to eat and sleep and the pain medication she has been prescribed only gives her 1-2hrs of relief. Pt up pacing the room, states walking helps the pain more bearable.

## 2012-05-29 NOTE — ED Notes (Signed)
Discharge and follow up instructions reviewed. Pt verbalized understanding.  

## 2012-05-29 NOTE — ED Notes (Signed)
Pt was seen here 1.5 wks prior and tx for kidney stones.  MD chose to do KUB over CT d/t pt's recent CT's.  Pt unable to get in to see urologist until next week, but pain is becoming unbearable.  LLQ pain, nausea.  No diarrhea, vag d/c or urinary s/s.  Last BM yesterday.

## 2012-05-30 LAB — URINE CULTURE
Colony Count: 45000
Special Requests: NORMAL

## 2012-05-30 NOTE — ED Provider Notes (Signed)
Medical screening examination/treatment/procedure(s) were performed by non-physician practitioner and as supervising physician I was immediately available for consultation/collaboration.  Flint Melter, MD 05/30/12 1901

## 2012-06-01 ENCOUNTER — Emergency Department (HOSPITAL_COMMUNITY)
Admission: EM | Admit: 2012-06-01 | Discharge: 2012-06-01 | Disposition: A | Discharge status: Home / Self Care | Payer: BC Managed Care – PPO | Attending: Emergency Medicine | Admitting: Emergency Medicine

## 2012-06-01 ENCOUNTER — Encounter (HOSPITAL_COMMUNITY): Payer: Self-pay | Admitting: Emergency Medicine

## 2012-06-01 DIAGNOSIS — Z9089 Acquired absence of other organs: Secondary | ICD-10-CM | POA: Insufficient documentation

## 2012-06-01 DIAGNOSIS — Z79899 Other long term (current) drug therapy: Secondary | ICD-10-CM | POA: Insufficient documentation

## 2012-06-01 DIAGNOSIS — F17200 Nicotine dependence, unspecified, uncomplicated: Secondary | ICD-10-CM | POA: Insufficient documentation

## 2012-06-01 DIAGNOSIS — R112 Nausea with vomiting, unspecified: Secondary | ICD-10-CM | POA: Insufficient documentation

## 2012-06-01 DIAGNOSIS — Z8719 Personal history of other diseases of the digestive system: Secondary | ICD-10-CM | POA: Insufficient documentation

## 2012-06-01 DIAGNOSIS — Z8679 Personal history of other diseases of the circulatory system: Secondary | ICD-10-CM | POA: Insufficient documentation

## 2012-06-01 DIAGNOSIS — Z9889 Other specified postprocedural states: Secondary | ICD-10-CM | POA: Insufficient documentation

## 2012-06-01 DIAGNOSIS — Z90710 Acquired absence of both cervix and uterus: Secondary | ICD-10-CM | POA: Insufficient documentation

## 2012-06-01 DIAGNOSIS — N23 Unspecified renal colic: Secondary | ICD-10-CM | POA: Insufficient documentation

## 2012-06-01 DIAGNOSIS — R109 Unspecified abdominal pain: Secondary | ICD-10-CM | POA: Insufficient documentation

## 2012-06-01 LAB — URINE MICROSCOPIC-ADD ON

## 2012-06-01 LAB — URINALYSIS, ROUTINE W REFLEX MICROSCOPIC
Bilirubin Urine: NEGATIVE
Glucose, UA: NEGATIVE mg/dL
Ketones, ur: NEGATIVE mg/dL
Nitrite: NEGATIVE
Protein, ur: NEGATIVE mg/dL
Specific Gravity, Urine: 1.015 (ref 1.005–1.030)
Urobilinogen, UA: 0.2 mg/dL (ref 0.0–1.0)
pH: 5.5 (ref 5.0–8.0)

## 2012-06-01 MED ORDER — HYDROMORPHONE HCL PF 1 MG/ML IJ SOLN
1.0000 mg | Freq: Once | INTRAMUSCULAR | Status: AC
  Administered 2012-06-01: 1 mg via INTRAMUSCULAR
  Filled 2012-06-01: qty 1

## 2012-06-01 MED ORDER — ONDANSETRON 4 MG PO TBDP
8.0000 mg | ORAL_TABLET | Freq: Once | ORAL | Status: AC
Start: 2012-06-01 — End: 2012-06-01
  Administered 2012-06-01: 8 mg via ORAL
  Filled 2012-06-01: qty 2

## 2012-06-01 MED ORDER — HYDROMORPHONE HCL PF 1 MG/ML IJ SOLN
1.0000 mg | Freq: Once | INTRAMUSCULAR | Status: AC
  Administered 2012-06-01: 1 mg via INTRAVENOUS
  Filled 2012-06-01: qty 1

## 2012-06-01 MED ORDER — KETOROLAC TROMETHAMINE 30 MG/ML IJ SOLN
30.0000 mg | Freq: Once | INTRAMUSCULAR | Status: AC
  Administered 2012-06-01: 30 mg via INTRAMUSCULAR
  Filled 2012-06-01: qty 1

## 2012-06-01 MED ORDER — OXYCODONE-ACETAMINOPHEN 5-325 MG PO TABS: 1.0000 | ORAL_TABLET | Freq: Four times a day (QID) | ORAL | Status: DC | PRN

## 2012-06-01 MED ORDER — ONDANSETRON HCL 4 MG/2ML IJ SOLN
4.0000 mg | Freq: Once | INTRAMUSCULAR | Status: AC
  Administered 2012-06-01: 4 mg via INTRAVENOUS
  Filled 2012-06-01: qty 2

## 2012-06-01 NOTE — ED Notes (Signed)
PT. REPORTS PERSISTENT LLQ PAIN AND HEMATURIA DIAGNOSED WITH KIDNEY STONE LAST Thursday PRESCRIBED WITH PERCOCET WITH NO RELIEF . OCCASIONAL NAUSEA/VOMITTING .

## 2012-06-01 NOTE — ED Provider Notes (Signed)
History     CSN: [id number]  Arrival date & time 06/01/12  0009   First MD Initiated Contact with Patient 06/01/12 0021      Chief Complaint  Patient presents with  . Nephrolithiasis    (Consider location/radiation/quality/duration/timing/severity/associated sxs/prior treatment) HPI Comments: Patient with long standing Hx of kidney stone some of which she has passed, has had lithotripsy, basket retrieval and stints now with new 4 mm l renal stone has been taking Percocet and phenergan with relative relief for 2 days but day has been vomiting and having increase pain because she has been unable to tolerate her medications   She was finally able  to keep a phenergan down several hours ago but the pain is still persistent.   Will call urology in the morning   The history is provided by the patient.    Past Medical History  Diagnosis Date  . Migraine   . Kidney stone   . Kidney stones   . Gallstones   . Kidney stone     Past Surgical History  Procedure Laterality Date  . Abdominal hysterectomy    . Tonsillectomy    . Lithotripsy    . Cholecystectomy  03/11/2011    Procedure: LAPAROSCOPIC CHOLECYSTECTOMY WITH INTRAOPERATIVE CHOLANGIOGRAM;  Surgeon: Atilano Ina, MD;  Location: [facility] OR;  Service: General;  Laterality: N/A;  . Cholecystectomy, laparoscopic    . Orif ankle fracture  12/25/2011    Procedure: OPEN REDUCTION INTERNAL FIXATION (ORIF) ANKLE FRACTURE;  Surgeon: Javier Docker, MD;  Location: [facility] ORS;  Service: Orthopedics;  Laterality: Left;  . Cast application  12/25/2011    Procedure: CAST APPLICATION;  Surgeon: Javier Docker, MD;  Location: [facility] ORS;  Service: Orthopedics;  Laterality: Left;    Family History  Problem Relation Age of Onset  . Diabetes Mother   . Diabetes Father   . Heart failure Father   . Hypertension Father   . Pulmonary fibrosis Father     History  Substance Use Topics  . Smoking status: Current Every Day Smoker -- 0.25 packs/day    Types:  Cigarettes  . Smokeless tobacco: Not on file  . Alcohol Use: No     Comment: occaisional    OB History   Grav Para Term Preterm Abortions TAB SAB Ect Mult Living                  Review of Systems  Constitutional: Negative for fever and chills.  Gastrointestinal: Positive for nausea and vomiting. Negative for abdominal pain.  Genitourinary: Positive for flank pain. Negative for dysuria and frequency.  Skin: Negative for pallor.  All other systems reviewed and are negative.    Allergies  Compazine and Metoclopramide hcl  Home Medications   Current Outpatient Rx  Name  Route  Sig  Dispense  Refill  . cholecalciferol (VITAMIN D) 1000 UNITS tablet   Oral   Take 2,000 Units by mouth daily.          . ciprofloxacin (CIPRO) 250 MG tablet   Oral   Take 250 mg by mouth 2 (two) times daily.         Marland Kitchen escitalopram (LEXAPRO) 20 MG tablet   Oral   Take 20 mg by mouth at bedtime.          Marland Kitchen ibuprofen (ADVIL,MOTRIN) 600 MG tablet   Oral   Take 1 tablet (600 mg total) by mouth every 6 (six) hours as needed for pain.  30 tablet   0   . omeprazole (PRILOSEC) 20 MG capsule   Oral   Take 20 mg by mouth at bedtime.          . promethazine (PHENERGAN) 25 MG tablet   Oral   Take 1 tablet (25 mg total) by mouth every 6 (six) hours as needed for nausea.   30 tablet   0   . oxyCODONE-acetaminophen (PERCOCET/ROXICET) 5-325 MG per tablet   Oral   Take 1-2 tablets by mouth every 6 (six) hours as needed for pain.   17 tablet   0     BP 105/53  Pulse 63  Temp(Src) 98.8 F (37.1 C) (Oral)  Resp 20  SpO2 94%  Physical Exam  Nursing note and vitals reviewed. Constitutional: She appears well-developed and well-nourished.  HENT:  Head: Normocephalic.  Eyes: Pupils are equal, round, and reactive to light.  Neck: Normal range of motion.  Cardiovascular: Normal rate.   Pulmonary/Chest: Effort normal.  Abdominal: Soft. Bowel sounds are normal. She exhibits no  distension. There is no tenderness.  Musculoskeletal: Normal range of motion.  Neurological: She is alert.  Skin: Skin is warm and dry.    ED Course  Procedures (including critical care time)  Labs Reviewed  URINALYSIS, ROUTINE W REFLEX MICROSCOPIC - Abnormal; Notable for the following:    Color, Urine RED (*)    APPearance CLOUDY (*)    Hgb urine dipstick LARGE (*)    Leukocytes, UA TRACE (*)    All other components within normal limits  URINE MICROSCOPIC-ADD ON   No results found.   1. Renal colic on left side       MDM  Better after several doses of Dilaudid and Zofran        Arman Filter, NP 06/01/12 [phone]

## 2012-06-01 NOTE — ED Provider Notes (Signed)
Medical screening examination/treatment/procedure(s) were performed by non-physician practitioner and as supervising physician I was immediately available for consultation/collaboration.  Flint Melter, MD 06/01/12 [phone]

## 2012-06-28 ENCOUNTER — Encounter (HOSPITAL_COMMUNITY): Payer: Self-pay | Admitting: *Deleted

## 2012-06-28 ENCOUNTER — Emergency Department (HOSPITAL_COMMUNITY): Payer: BC Managed Care – PPO

## 2012-06-28 ENCOUNTER — Observation Stay (HOSPITAL_COMMUNITY)
Admission: EM | Admit: 2012-06-28 | Discharge: 2012-06-30 | Disposition: A | Discharge status: Home / Self Care | Payer: BC Managed Care – PPO | Attending: Internal Medicine | Admitting: Internal Medicine

## 2012-06-28 DIAGNOSIS — N12 Tubulo-interstitial nephritis, not specified as acute or chronic: Secondary | ICD-10-CM

## 2012-06-28 DIAGNOSIS — A498 Other bacterial infections of unspecified site: Secondary | ICD-10-CM | POA: Insufficient documentation

## 2012-06-28 DIAGNOSIS — N390 Urinary tract infection, site not specified: Secondary | ICD-10-CM | POA: Diagnosis present

## 2012-06-28 DIAGNOSIS — E861 Hypovolemia: Secondary | ICD-10-CM | POA: Insufficient documentation

## 2012-06-28 DIAGNOSIS — K5909 Other constipation: Secondary | ICD-10-CM | POA: Diagnosis present

## 2012-06-28 DIAGNOSIS — I959 Hypotension, unspecified: Secondary | ICD-10-CM

## 2012-06-28 DIAGNOSIS — R310 Gross hematuria: Secondary | ICD-10-CM

## 2012-06-28 DIAGNOSIS — I9589 Other hypotension: Secondary | ICD-10-CM | POA: Insufficient documentation

## 2012-06-28 DIAGNOSIS — N200 Calculus of kidney: Secondary | ICD-10-CM

## 2012-06-28 DIAGNOSIS — Z87448 Personal history of other diseases of urinary system: Secondary | ICD-10-CM | POA: Diagnosis present

## 2012-06-28 DIAGNOSIS — K5900 Constipation, unspecified: Secondary | ICD-10-CM

## 2012-06-28 DIAGNOSIS — R1110 Vomiting, unspecified: Secondary | ICD-10-CM

## 2012-06-28 DIAGNOSIS — Z87442 Personal history of urinary calculi: Secondary | ICD-10-CM | POA: Insufficient documentation

## 2012-06-28 DIAGNOSIS — N10 Acute tubulo-interstitial nephritis: Principal | ICD-10-CM

## 2012-06-28 DIAGNOSIS — E860 Dehydration: Secondary | ICD-10-CM

## 2012-06-28 HISTORY — DX: Acute pyelonephritis: N10

## 2012-06-28 LAB — HEPATIC FUNCTION PANEL
ALT: 8 U/L (ref 0–35)
AST: 13 U/L (ref 0–37)
Albumin: 3.4 g/dL — ABNORMAL LOW (ref 3.5–5.2)
Alkaline Phosphatase: 79 U/L (ref 39–117)
Bilirubin, Direct: <0.1 mg/dL (ref 0.0–0.3)
Total Bilirubin: 0.2 mg/dL — ABNORMAL LOW (ref 0.3–1.2)
Total Protein: 6.6 g/dL (ref 6.0–8.3)

## 2012-06-28 LAB — URINALYSIS, ROUTINE W REFLEX MICROSCOPIC
Bilirubin Urine: NEGATIVE
Glucose, UA: NEGATIVE mg/dL
Ketones, ur: NEGATIVE mg/dL
Nitrite: NEGATIVE
Protein, ur: NEGATIVE mg/dL
Specific Gravity, Urine: 1.014 (ref 1.005–1.030)
Urobilinogen, UA: 0.2 mg/dL (ref 0.0–1.0)
pH: 6.5 (ref 5.0–8.0)

## 2012-06-28 LAB — CREATININE, SERUM
Creatinine, Ser: 0.63 mg/dL (ref 0.50–1.10)
GFR calc Af Amer: >90 mL/min (ref >90)
GFR calc non Af Amer: >90 mL/min (ref >90)

## 2012-06-28 LAB — CBC
HCT: 36.1 % (ref 36.0–46.0)
Hemoglobin: 12.3 g/dL (ref 12.0–15.0)
MCH: 30.4 pg (ref 26.0–34.0)
MCHC: 34.1 g/dL (ref 30.0–36.0)
MCV: 89.1 fL (ref 78.0–100.0)
Platelets: 194 10*3/uL (ref 150–400)
RBC: 4.05 MIL/uL (ref 3.87–5.11)
RDW: 13.3 % (ref 11.5–15.5)
WBC: 9.9 10*3/uL (ref 4.0–10.5)

## 2012-06-28 LAB — POCT I-STAT, CHEM 8
BUN: 11 mg/dL (ref 6–23)
Calcium, Ion: 1.21 mmol/L (ref 1.12–1.23)
Chloride: 106 mEq/L (ref 96–112)
Creatinine, Ser: 0.7 mg/dL (ref 0.50–1.10)
Glucose, Bld: 89 mg/dL (ref 70–99)
HCT: 40 % (ref 36.0–46.0)
Hemoglobin: 13.6 g/dL (ref 12.0–15.0)
Potassium: 3.7 mEq/L (ref 3.5–5.1)
Sodium: 141 mEq/L (ref 135–145)
TCO2: 26 mmol/L (ref 0–100)

## 2012-06-28 LAB — CBC WITH DIFFERENTIAL/PLATELET
Basophils Absolute: 0.1 10*3/uL (ref 0.0–0.1)
Basophils Relative: 1 % (ref 0–1)
Eosinophils Absolute: 0.2 10*3/uL (ref 0.0–0.7)
Eosinophils Relative: 2 % (ref 0–5)
HCT: 38.3 % (ref 36.0–46.0)
Hemoglobin: 13.4 g/dL (ref 12.0–15.0)
Lymphocytes Relative: 37 % (ref 12–46)
Lymphs Abs: 4.5 10*3/uL — ABNORMAL HIGH (ref 0.7–4.0)
MCH: 31.1 pg (ref 26.0–34.0)
MCHC: 35 g/dL (ref 30.0–36.0)
MCV: 88.9 fL (ref 78.0–100.0)
Monocytes Absolute: 0.9 10*3/uL (ref 0.1–1.0)
Monocytes Relative: 7 % (ref 3–12)
Neutro Abs: 6.6 10*3/uL (ref 1.7–7.7)
Neutrophils Relative %: 54 % (ref 43–77)
Platelets: 243 10*3/uL (ref 150–400)
RBC: 4.31 MIL/uL (ref 3.87–5.11)
RDW: 13.3 % (ref 11.5–15.5)
WBC: 12.3 10*3/uL — ABNORMAL HIGH (ref 4.0–10.5)

## 2012-06-28 LAB — URINE MICROSCOPIC-ADD ON

## 2012-06-28 LAB — CG4 I-STAT (LACTIC ACID): Lactic Acid, Venous: 1.84 mmol/L (ref 0.5–2.2)

## 2012-06-28 LAB — LIPASE, BLOOD: Lipase: 24 U/L (ref 11–59)

## 2012-06-28 MED ORDER — HYDROMORPHONE HCL PF 1 MG/ML IJ SOLN
1.0000 mg | INTRAMUSCULAR | Status: DC | PRN
  Administered 2012-06-28 – 2012-06-30 (×7): 1 mg via INTRAVENOUS
  Filled 2012-06-28 (×7): qty 1

## 2012-06-28 MED ORDER — ONDANSETRON HCL 4 MG/2ML IJ SOLN
4.0000 mg | Freq: Four times a day (QID) | INTRAMUSCULAR | Status: DC | PRN
  Administered 2012-06-28 – 2012-06-29 (×4): 4 mg via INTRAVENOUS
  Filled 2012-06-28 (×4): qty 2

## 2012-06-28 MED ORDER — KETOROLAC TROMETHAMINE 30 MG/ML IJ SOLN
30.0000 mg | Freq: Four times a day (QID) | INTRAMUSCULAR | Status: AC
  Administered 2012-06-28 – 2012-06-29 (×4): 30 mg via INTRAVENOUS
  Filled 2012-06-28 (×4): qty 1

## 2012-06-28 MED ORDER — DEXTROSE 5 % IV SOLN
1.0000 g | INTRAVENOUS | Status: DC
  Administered 2012-06-28 – 2012-06-30 (×3): 1 g via INTRAVENOUS
  Filled 2012-06-28 (×3): qty 10

## 2012-06-28 MED ORDER — ACETAMINOPHEN 325 MG PO TABS
650.0000 mg | ORAL_TABLET | Freq: Four times a day (QID) | ORAL | Status: DC | PRN
  Administered 2012-06-30: 650 mg via ORAL
  Filled 2012-06-28: qty 2

## 2012-06-28 MED ORDER — HYDROMORPHONE HCL PF 1 MG/ML IJ SOLN
1.0000 mg | Freq: Once | INTRAMUSCULAR | Status: AC
  Administered 2012-06-28: 1 mg via INTRAVENOUS
  Filled 2012-06-28: qty 1

## 2012-06-28 MED ORDER — DIPHENHYDRAMINE HCL 25 MG PO CAPS
25.0000 mg | ORAL_CAPSULE | Freq: Four times a day (QID) | ORAL | Status: DC | PRN
  Administered 2012-06-28: 25 mg via ORAL
  Filled 2012-06-28: qty 1

## 2012-06-28 MED ORDER — SODIUM CHLORIDE 0.9 % IV BOLUS (SEPSIS)
1000.0000 mL | Freq: Once | INTRAVENOUS | Status: AC
  Administered 2012-06-28: 1000 mL via INTRAVENOUS

## 2012-06-28 MED ORDER — PANTOPRAZOLE SODIUM 40 MG PO TBEC
40.0000 mg | DELAYED_RELEASE_TABLET | Freq: Every day | ORAL | Status: DC
  Administered 2012-06-28 – 2012-06-29 (×2): 40 mg via ORAL
  Filled 2012-06-28 (×2): qty 1

## 2012-06-28 MED ORDER — ESCITALOPRAM OXALATE 20 MG PO TABS
20.0000 mg | ORAL_TABLET | Freq: Every day | ORAL | Status: DC
  Administered 2012-06-28 – 2012-06-29 (×2): 20 mg via ORAL
  Filled 2012-06-28 (×3): qty 1

## 2012-06-28 MED ORDER — DIPHENHYDRAMINE HCL 50 MG/ML IJ SOLN
25.0000 mg | Freq: Once | INTRAMUSCULAR | Status: AC
  Administered 2012-06-28: 25 mg via INTRAVENOUS
  Filled 2012-06-28: qty 1

## 2012-06-28 MED ORDER — DEXTROSE 5 % IV SOLN
1.0000 g | Freq: Once | INTRAVENOUS | Status: AC
  Administered 2012-06-28: 1 g via INTRAVENOUS
  Filled 2012-06-28: qty 10

## 2012-06-28 MED ORDER — SODIUM CHLORIDE 0.9 % IV SOLN: INTRAVENOUS | Status: AC

## 2012-06-28 MED ORDER — HYDROMORPHONE HCL PF 2 MG/ML IJ SOLN
2.0000 mg | Freq: Once | INTRAMUSCULAR | Status: AC
  Administered 2012-06-28: 2 mg via INTRAVENOUS
  Filled 2012-06-28: qty 1

## 2012-06-28 MED ORDER — ONDANSETRON HCL 4 MG PO TABS: 4.0000 mg | ORAL_TABLET | Freq: Four times a day (QID) | ORAL | Status: DC | PRN

## 2012-06-28 MED ORDER — SODIUM CHLORIDE 0.9 % IV BOLUS (SEPSIS)
2000.0000 mL | Freq: Once | INTRAVENOUS | Status: AC
  Administered 2012-06-28: 500 mL via INTRAVENOUS
  Administered 2012-06-28: 2000 mL via INTRAVENOUS

## 2012-06-28 MED ORDER — ENOXAPARIN SODIUM 40 MG/0.4ML ~~LOC~~ SOLN
40.0000 mg | SUBCUTANEOUS | Status: DC
  Administered 2012-06-28: 40 mg via SUBCUTANEOUS
  Filled 2012-06-28 (×3): qty 0.4

## 2012-06-28 MED ORDER — FENTANYL CITRATE 0.05 MG/ML IJ SOLN: 50.0000 ug | Freq: Once | INTRAMUSCULAR | Status: DC

## 2012-06-28 MED ORDER — ONDANSETRON HCL 4 MG/2ML IJ SOLN
4.0000 mg | Freq: Once | INTRAMUSCULAR | Status: AC
  Administered 2012-06-28: 4 mg via INTRAVENOUS
  Filled 2012-06-28: qty 2

## 2012-06-28 MED ORDER — ACETAMINOPHEN 650 MG RE SUPP: 650.0000 mg | Freq: Four times a day (QID) | RECTAL | Status: DC | PRN

## 2012-06-28 MED ORDER — POTASSIUM CHLORIDE IN NACL 20-0.9 MEQ/L-% IV SOLN
INTRAVENOUS | Status: DC
  Administered 2012-06-28: 20:00:00 via INTRAVENOUS
  Administered 2012-06-28: 125 mL/h via INTRAVENOUS
  Administered 2012-06-29 – 2012-06-30 (×4): via INTRAVENOUS
  Filled 2012-06-28 (×8): qty 1000

## 2012-06-28 NOTE — ED Notes (Signed)
Pt states she administered ibuprofen 400 mg prior to arrival

## 2012-06-28 NOTE — H&P (Signed)
[facility] History and Physical  [name] ZOX:[id number] DOB: Nov 17, 1980 DOA: 06/28/2012  Referring physician: Dr. Ethelda Chick PCP: Tommy Rainwater, MD at [facility] in [facility] Specialists:   Chief Complaint: Flank pain  HPI: [name] is a 32 y.o. female with history of kidney stones in the past who presents to the emergency room with flank pain. Patient reports that she has had kidney stones in the past and has had lithotripsy done 2 years ago. Over the past month, she's had frequent trips to the emergency room for flank pain radiating to her groin. She usually receives pain and nausea medications in the emergency room, improves and returns home. She has not followed up with her urologist as of yet. She reports that yesterday she had acute onset of left-sided flank pain, radiating into her groin and into her upper leg. This was associated with fevers, dysuria, vomiting. She was unable to keep anything down. She was seen in the emergency department and this concerns her underlying urinary tract infection. Since she is unable to keep down by mouth antibiotics, she's been referred for admission.  Review of Systems: Pertinent positives as per history of present illness, otherwise negative  Past Medical History  Diagnosis Date  . Migraine   . Kidney stone   . Kidney stones   . Gallstones   . Kidney stone    Past Surgical History  Procedure Laterality Date  . Abdominal hysterectomy    . Tonsillectomy    . Lithotripsy    . Cholecystectomy  03/11/2011    Procedure: LAPAROSCOPIC CHOLECYSTECTOMY WITH INTRAOPERATIVE CHOLANGIOGRAM;  Surgeon: Atilano Ina, MD;  Location: [facility] OR;  Service: General;  Laterality: N/A;  . Cholecystectomy, laparoscopic    . Orif ankle fracture  12/25/2011    Procedure: OPEN REDUCTION INTERNAL FIXATION (ORIF) ANKLE FRACTURE;  Surgeon: Javier Docker, MD;  Location: [facility] ORS;  Service: Orthopedics;  Laterality: Left;  . Cast application   12/25/2011    Procedure: CAST APPLICATION;  Surgeon: Javier Docker, MD;  Location: [facility] ORS;  Service: Orthopedics;  Laterality: Left;   Social History:  reports that she has been smoking Cigarettes.  She has been smoking about 0.25 packs per day. She does not have any smokeless tobacco history on file. She reports that she does not drink alcohol or use illicit drugs.   Allergies  Allergen Reactions  . Compazine Other (See Comments)    dystonia  . Metoclopramide Hcl Other (See Comments)    dystonia    Family History  Problem Relation Age of Onset  . Diabetes Mother   . Diabetes Father   . Heart failure Father   . Hypertension Father   . Pulmonary fibrosis Father      Prior to Admission medications   Medication Sig Start Date End Date Taking? Authorizing Provider  cholecalciferol (VITAMIN D) 1000 UNITS tablet Take 2,000 Units by mouth daily.    Yes Historical Provider, MD  escitalopram (LEXAPRO) 20 MG tablet Take 20 mg by mouth at bedtime.    Yes Historical Provider, MD  omeprazole (PRILOSEC) 20 MG capsule Take 20 mg by mouth at bedtime.    Yes Historical Provider, MD   Physical Exam: Filed Vitals:   06/28/12 0224 06/28/12 0310  BP: 128/115 114/72  Pulse: 104   Temp: 101 F (38.3 C) 98.1 F (36.7 C)  TempSrc: Oral   Resp: 20 22  SpO2: 96% 99%     General:  No acute distress  Eyes: Pupils are  equal round react to light  ENT: Mucous membranes are dry  Neck: Supple  Cardiovascular: S1, S2, regular rhythm  Respiratory: Clear to auscultation bilaterally  Abdomen: Soft, nontender, nondistended, bowel sounds are active, positive left CVA tenderness  Skin: No rashes  Musculoskeletal: No pedal edema, cyanosis bilaterally  Psychiatric: Normal affect, cooperative with exam  Neurologic: Grossly intact, nonfocal  Labs on Admission:  Basic Metabolic Panel:  Recent Labs Lab 06/28/12 0449  NA 141  K 3.7  CL 106  GLUCOSE 89  BUN 11  CREATININE 0.70   Liver  Function Tests: No results found for this basename: AST, ALT, ALKPHOS, BILITOT, PROT, ALBUMIN,  in the last 168 hours No results found for this basename: LIPASE, AMYLASE,  in the last 168 hours No results found for this basename: AMMONIA,  in the last 168 hours CBC:  Recent Labs Lab 06/28/12 0255 06/28/12 0449  WBC 12.3*  --   NEUTROABS 6.6  --   HGB 13.4 13.6  HCT 38.3 40.0  MCV 88.9  --   PLT 243  --    Cardiac Enzymes: No results found for this basename: CKTOTAL, CKMB, CKMBINDEX, TROPONINI,  in the last 168 hours  BNP (last 3 results) No results found for this basename: PROBNP,  in the last 8760 hours CBG: No results found for this basename: GLUCAP,  in the last 168 hours  Radiological Exams on Admission: Ct Abdomen Pelvis Wo Contrast  06/28/2012  *RADIOLOGY REPORT*  Clinical Data: Left flank pain  CT ABDOMEN AND PELVIS WITHOUT CONTRAST  Technique:  Multidetector CT imaging of the abdomen and pelvis was performed following the standard protocol without intravenous contrast.  Comparison: 12/17/2011  Findings: Limited images through the lung bases demonstrate no significant appreciable abnormality. The heart size is within normal limits. No pleural or pericardial effusion.  Organ abnormality/lesion detection is limited in the absence of intravenous contrast. Within this limitation,  hepatic granuloma. Unremarkable spleen.  Absent gallbladder.  No biliary ductal dilatation.  Unremarkable pancreas and adrenal glands.  Multiple nonobstructing left renal stones.  Tiny right renal calculus identified on coronal.  No hydronephrosis or hydroureter. No ureteral calculi.  Decompressed colon without overt evidence for colitis.  Normal appendix.  Small bowel loops are normal course and caliber.  No free intraperitoneal air or fluid.  No lymphadenopathy.  Normal caliber aorta and branch vessels.  Partially decompressed bladder.  Absent uterus.  No adnexal mass.  No acute osseous finding.  IMPRESSION:  Nonobstructing renal stones.  No ureteral calculi or hydronephrosis.   Original Report Authenticated By: Jearld Lesch, M.D.     Assessment/Plan Principal Problem:   UTI (urinary tract infection) Active Problems:   Vomiting   Nephrolithiasis   Dehydration   1. Urinary tract infection. Start empirically on Rocephin. Followup urine culture. Watch for recurrent fevers. 2. Nephrolithiasis. CT did not indicate any obstructing stones. She does not have any stones in her ureters and no evidence of hydronephrosis. She is advised to followup with [facility] as an outpatient. She has seen Dr. Patsi Sears in the past. 3. Vomiting. Likely related to #1. To treat symptomatically. Check liver function tests lipase. 4. Dehydration. We'll aggressively hydrate with IV fluids.  5. Disposition. She'll be admitted for 23 hour observation. Can likely discharge home tomorrow with outpatient urology followup.   Code Status: full code Family Communication: discussed with patient Disposition Plan: probable discharge home tomorrow.  Time spent:  [facility] Pager [phone]  If 7PM-7AM, please  contact night-coverage [email or web address] Password [email or web address] 06/28/2012, 8:45 AM

## 2012-06-28 NOTE — ED Notes (Signed)
Pt states she has left side kidney stone which she has not been able to follow up with urologist, running low grade fever, and increased pain.

## 2012-06-28 NOTE — ED Provider Notes (Signed)
History     CSN: [id number]  Arrival date & time 06/28/12  0217   First MD Initiated Contact with Patient 06/28/12 0424      Chief Complaint  Patient presents with  . Nephrolithiasis    (Consider location/radiation/quality/duration/timing/severity/associated sxs/prior treatment) HPI [place] of left flank pain radiating to left groin onset 9 PM tonight typical of kidney stone pain she's had in the past. Symptoms accompanied by fever 101.5. , and2 episodes of vomiting. She treated self with ibuprofen prostate 2 hours prior to coming here with minimal relief. Nauseous it has improved since treatment with Zofran in the emergency department. Pain is severe and sharp. Nothing makes symptoms better or worse. No other associated symptoms. Past Medical History  Diagnosis Date  . Migraine   . Kidney stone   . Kidney stones   . Gallstones   . Kidney stone     Past Surgical History  Procedure Laterality Date  . Abdominal hysterectomy    . Tonsillectomy    . Lithotripsy    . Cholecystectomy  03/11/2011    Procedure: LAPAROSCOPIC CHOLECYSTECTOMY WITH INTRAOPERATIVE CHOLANGIOGRAM;  Surgeon: Atilano Ina, MD;  Location: [facility] OR;  Service: General;  Laterality: N/A;  . Cholecystectomy, laparoscopic    . Orif ankle fracture  12/25/2011    Procedure: OPEN REDUCTION INTERNAL FIXATION (ORIF) ANKLE FRACTURE;  Surgeon: Javier Docker, MD;  Location: [facility] ORS;  Service: Orthopedics;  Laterality: Left;  . Cast application  12/25/2011    Procedure: CAST APPLICATION;  Surgeon: Javier Docker, MD;  Location: [facility] ORS;  Service: Orthopedics;  Laterality: Left;    Family History  Problem Relation Age of Onset  . Diabetes Mother   . Diabetes Father   . Heart failure Father   . Hypertension Father   . Pulmonary fibrosis Father     History  Substance Use Topics  . Smoking status: Current Every Day Smoker -- 0.25 packs/day    Types: Cigarettes  . Smokeless tobacco: Not on file  . Alcohol Use: No   Comment: occaisional    OB History   Grav Para Term Preterm Abortions TAB SAB Ect Mult Living                  Review of Systems  Constitutional: Positive for fever.  Gastrointestinal: Positive for nausea and vomiting.  Genitourinary: Positive for flank pain.    Allergies  Compazine and Metoclopramide hcl  Home Medications   Current Outpatient Rx  Name  Route  Sig  Dispense  Refill  . cholecalciferol (VITAMIN D) 1000 UNITS tablet   Oral   Take 2,000 Units by mouth daily.          Marland Kitchen escitalopram (LEXAPRO) 20 MG tablet   Oral   Take 20 mg by mouth at bedtime.          Marland Kitchen omeprazole (PRILOSEC) 20 MG capsule   Oral   Take 20 mg by mouth at bedtime.            BP 114/72  Pulse 104  Temp(Src) 98.1 F (36.7 C) (Oral)  Resp 22  SpO2 99%  Physical Exam  Nursing note and vitals reviewed. Constitutional: She appears well-developed and well-nourished.  Appears uncomfortable  HENT:  Head: Normocephalic and atraumatic.  Eyes: Conjunctivae are normal. Pupils are equal, round, and reactive to light.  Neck: Neck supple. No tracheal deviation present. No thyromegaly present.  Cardiovascular: Normal rate and regular rhythm.   No murmur heard. Pulmonary/Chest: Effort  normal and breath sounds normal.  Abdominal: Soft. Bowel sounds are normal. She exhibits no distension. There is no tenderness.  Obese   Genitourinary:  Minimal left flank tenderness  Musculoskeletal: Normal range of motion. She exhibits no edema and no tenderness.  Neurological: She is alert. Coordination normal.  Skin: Skin is warm and dry. No rash noted.  Psychiatric: She has a normal mood and affect.    ED Course  Procedures (including critical care time)  Labs Reviewed  URINE CULTURE  URINALYSIS, ROUTINE W REFLEX MICROSCOPIC  CBC WITH DIFFERENTIAL   No results found.   No diagnosis found.  7:15 AM patient feels improved after treatment with intravenous opioids, antiemetics, intravenous  fluids, antibiotic and Benadryl she appears more comfortable sitting up in bed, alert Results for orders placed during the hospital encounter of 06/28/12  URINALYSIS, ROUTINE W REFLEX MICROSCOPIC      Result Value Range   Color, Urine YELLOW  YELLOW   APPearance CLOUDY (*) CLEAR   Specific Gravity, Urine 1.014  1.005 - 1.030   pH 6.5  5.0 - 8.0   Glucose, UA NEGATIVE  NEGATIVE mg/dL   Hgb urine dipstick LARGE (*) NEGATIVE   Bilirubin Urine NEGATIVE  NEGATIVE   Ketones, ur NEGATIVE  NEGATIVE mg/dL   Protein, ur NEGATIVE  NEGATIVE mg/dL   Urobilinogen, UA 0.2  0.0 - 1.0 mg/dL   Nitrite NEGATIVE  NEGATIVE   Leukocytes, UA TRACE (*) NEGATIVE  CBC WITH DIFFERENTIAL      Result Value Range   WBC 12.3 (*) 4.0 - 10.5 K/uL   RBC 4.31  3.87 - 5.11 MIL/uL   Hemoglobin 13.4  12.0 - 15.0 g/dL   HCT 91.4  78.2 - 95.6 %   MCV 88.9  78.0 - 100.0 fL   MCH 31.1  26.0 - 34.0 pg   MCHC 35.0  30.0 - 36.0 g/dL   RDW 21.3  08.6 - 57.8 %   Platelets 243  150 - 400 K/uL   Neutrophils Relative 54  43 - 77 %   Neutro Abs 6.6  1.7 - 7.7 K/uL   Lymphocytes Relative 37  12 - 46 %   Lymphs Abs 4.5 (*) 0.7 - 4.0 K/uL   Monocytes Relative 7  3 - 12 %   Monocytes Absolute 0.9  0.1 - 1.0 K/uL   Eosinophils Relative 2  0 - 5 %   Eosinophils Absolute 0.2  0.0 - 0.7 K/uL   Basophils Relative 1  0 - 1 %   Basophils Absolute 0.1  0.0 - 0.1 K/uL  URINE MICROSCOPIC-ADD ON      Result Value Range   Squamous Epithelial / LPF RARE  RARE   WBC, UA 0-2  <3 WBC/hpf   RBC / HPF TOO NUMEROUS TO COUNT  <3 RBC/hpf   Bacteria, UA MANY (*) RARE  POCT I-STAT, CHEM 8      Result Value Range   Sodium 141  135 - 145 mEq/L   Potassium 3.7  3.5 - 5.1 mEq/L   Chloride 106  96 - 112 mEq/L   BUN 11  6 - 23 mg/dL   Creatinine, Ser 4.69  0.50 - 1.10 mg/dL   Glucose, Bld 89  70 - 99 mg/dL   Calcium, Ion 6.29  5.28 - 1.23 mmol/L   TCO2 26  0 - 100 mmol/L   Hemoglobin 13.6  12.0 - 15.0 g/dL   HCT 41.3  24.4 - 01.0 %  CG4 I-STAT  (  LACTIC ACID)      Result Value Range   Lactic Acid, Venous 1.84  0.5 - 2.2 mmol/L   Ct Abdomen Pelvis Wo Contrast  06/28/2012  *RADIOLOGY REPORT*  Clinical Data: Left flank pain  CT ABDOMEN AND PELVIS WITHOUT CONTRAST  Technique:  Multidetector CT imaging of the abdomen and pelvis was performed following the standard protocol without intravenous contrast.  Comparison: 12/17/2011  Findings: Limited images through the lung bases demonstrate no significant appreciable abnormality. The heart size is within normal limits. No pleural or pericardial effusion.  Organ abnormality/lesion detection is limited in the absence of intravenous contrast. Within this limitation,  hepatic granuloma. Unremarkable spleen.  Absent gallbladder.  No biliary ductal dilatation.  Unremarkable pancreas and adrenal glands.  Multiple nonobstructing left renal stones.  Tiny right renal calculus identified on coronal.  No hydronephrosis or hydroureter. No ureteral calculi.  Decompressed colon without overt evidence for colitis.  Normal appendix.  Small bowel loops are normal course and caliber.  No free intraperitoneal air or fluid.  No lymphadenopathy.  Normal caliber aorta and branch vessels.  Partially decompressed bladder.  Absent uterus.  No adnexal mass.  No acute osseous finding.  IMPRESSION: Nonobstructing renal stones.  No ureteral calculi or hydronephrosis.   Original Report Authenticated By: Jearld Lesch, M.D.    US Renal  05/29/2012  *RADIOLOGY REPORT*  Clinical Data: Left lower quadrant abdominal pain radiating into left back.  History of renal calculi.  Urinary tract infection.  RENAL/URINARY TRACT ULTRASOUND COMPLETE  Comparison:  Several abdominal CT studies from 2013 as well as a prior renal ultrasound on 08/04/2011.  Findings:  Right Kidney:  11 cm in length.  No evidence of obstruction, focal lesion or shadowing calculus.  Left Kidney:  11.7 cm in length.  Suggestion of a small nonobstructing calculus in the lower  pole measuring roughly 4 mm. No hydronephrosis or focal lesion.  Bladder:  Bladder is nondistended.  IMPRESSION: No evidence of hydronephrosis.  Probable 4 mm nonobstructing calculus identified in the lower left kidney.   Original Report Authenticated By: Irish Lack, M.D.     MDM  Spoke with Dr. Kerry Hough plan 23 hour observation Intravenous antibiotics, fluids, symptomatic control Diagnosis pyelonephritis        Doug Sou, MD 06/28/12 [phone]

## 2012-06-28 NOTE — Progress Notes (Signed)
Sent a text in Amion to Dr Kerry Hough that patient's BP continues to run 80s systolic. Patient asymptomatic.

## 2012-06-28 NOTE — Progress Notes (Signed)
Text sent to Dr Dallas Schimke on Amion re: patient's Bp 84 systolic - asymtomatic- returned call. No new orders - continue to monitor patient BP.

## 2012-06-28 NOTE — Progress Notes (Signed)
Utilization Review Completed.   Shayle Donahoo, RN, BSN Nurse Case Manager  [phone]  

## 2012-06-29 ENCOUNTER — Observation Stay (HOSPITAL_COMMUNITY): Payer: BC Managed Care – PPO

## 2012-06-29 DIAGNOSIS — Z87448 Personal history of other diseases of urinary system: Secondary | ICD-10-CM | POA: Diagnosis present

## 2012-06-29 DIAGNOSIS — R310 Gross hematuria: Secondary | ICD-10-CM

## 2012-06-29 DIAGNOSIS — N10 Acute tubulo-interstitial nephritis: Secondary | ICD-10-CM

## 2012-06-29 DIAGNOSIS — N200 Calculus of kidney: Secondary | ICD-10-CM

## 2012-06-29 DIAGNOSIS — E860 Dehydration: Secondary | ICD-10-CM

## 2012-06-29 DIAGNOSIS — I959 Hypotension, unspecified: Secondary | ICD-10-CM | POA: Diagnosis present

## 2012-06-29 HISTORY — DX: Acute pyelonephritis: N10

## 2012-06-29 LAB — BASIC METABOLIC PANEL
BUN: 5 mg/dL — ABNORMAL LOW (ref 6–23)
CO2: 24 mEq/L (ref 19–32)
Calcium: 8.7 mg/dL (ref 8.4–10.5)
Chloride: 106 mEq/L (ref 96–112)
Creatinine, Ser: 0.66 mg/dL (ref 0.50–1.10)
GFR calc Af Amer: >90 mL/min (ref >90)
GFR calc non Af Amer: >90 mL/min (ref >90)
Glucose, Bld: 89 mg/dL (ref 70–99)
Potassium: 4.1 mEq/L (ref 3.5–5.1)
Sodium: 138 mEq/L (ref 135–145)

## 2012-06-29 LAB — CBC
HCT: 35.5 % — ABNORMAL LOW (ref 36.0–46.0)
Hemoglobin: 11.9 g/dL — ABNORMAL LOW (ref 12.0–15.0)
MCH: 30.4 pg (ref 26.0–34.0)
MCHC: 33.5 g/dL (ref 30.0–36.0)
MCV: 90.6 fL (ref 78.0–100.0)
Platelets: 195 10*3/uL (ref 150–400)
RBC: 3.92 MIL/uL (ref 3.87–5.11)
RDW: 13.5 % (ref 11.5–15.5)
WBC: 7.6 10*3/uL (ref 4.0–10.5)

## 2012-06-29 LAB — TSH: TSH: 1.619 u[IU]/mL (ref 0.350–4.500)

## 2012-06-29 MED ORDER — PANTOPRAZOLE SODIUM 40 MG IV SOLR
40.0000 mg | Freq: Two times a day (BID) | INTRAVENOUS | Status: DC
  Administered 2012-06-29 – 2012-06-30 (×2): 40 mg via INTRAVENOUS
  Filled 2012-06-29 (×3): qty 40

## 2012-06-29 MED ORDER — PROMETHAZINE HCL 25 MG/ML IJ SOLN
12.5000 mg | Freq: Four times a day (QID) | INTRAMUSCULAR | Status: DC | PRN
  Administered 2012-06-29 (×2): 12.5 mg via INTRAVENOUS
  Filled 2012-06-29 (×2): qty 1

## 2012-06-29 MED ORDER — ELETRIPTAN HYDROBROMIDE 40 MG PO TABS
40.0000 mg | ORAL_TABLET | ORAL | Status: DC | PRN
  Administered 2012-06-29: 40 mg via ORAL
  Filled 2012-06-29: qty 1

## 2012-06-29 MED ORDER — OXYCODONE HCL 5 MG PO TABS
5.0000 mg | ORAL_TABLET | ORAL | Status: DC | PRN
  Administered 2012-06-29 – 2012-06-30 (×4): 5 mg via ORAL
  Filled 2012-06-29 (×5): qty 1

## 2012-06-29 NOTE — Progress Notes (Signed)
Rn called to patient's room because she was nauseated and vomiting. Patient vomited small amount of yellow and clear emesis. Patient c/o severe pain. Pt told Rn to look at urine in hat. Pt urine was blood tinged. Rn notified md. New orders written. VSS. Will continue to monitor.

## 2012-06-29 NOTE — Progress Notes (Signed)
Subjective: The patient continues to have mild left greater than right flank pain, but analgesics are helping. No active nausea vomiting. Her appetite is fair, but improving. She has no dizziness when she stands up and walks in the room.  Objective: Vital signs in last 24 hours: Filed Vitals:   06/29/12 0547 06/29/12 0630 06/29/12 0838 06/29/12 0951  BP: 87/51 90/58 106/58 105/62  Pulse: 55 60 69 54  Temp: 98.1 F (36.7 C)   97.9 F (36.6 C)  TempSrc: Oral   Oral  Resp: 18   15  Height:      Weight:      SpO2: 100%   96%    Intake/Output Summary (Last 24 hours) at 06/29/12 1204 Last data filed at 06/29/12 0547  Gross per 24 hour  Intake   1450 ml  Output   3325 ml  Net  -1875 ml    Weight change:   Physical exam: Lungs: Clear to auscultation bilaterally. Heart: S1, S2, with borderline bradycardia.. Abdomen: Positive bowel sounds, soft, nontender, nondistended. No appreciable flank tenderness bilaterally. Extremities: No pedal edema. Next line neurologic: She is alert and oriented x3. Cranial nerves II through XII are intact.   Lab Results: Basic Metabolic Panel:  Recent Labs  16/10/96 0449 06/28/12 1101 06/29/12 0416  NA 141  --  138  K 3.7  --  4.1  CL 106  --  106  CO2  --   --  24  GLUCOSE 89  --  89  BUN 11  --  5*  CREATININE 0.70 0.63 0.66  CALCIUM  --   --  8.7   Liver Function Tests:  Recent Labs  06/28/12 1101  AST 13  ALT 8  ALKPHOS 79  BILITOT 0.2*  PROT 6.6  ALBUMIN 3.4*    Recent Labs  06/28/12 1101  LIPASE 24   No results found for this basename: AMMONIA,  in the last 72 hours CBC:  Recent Labs  06/28/12 0255  06/28/12 1101 06/29/12 0416  WBC 12.3*  --  9.9 7.6  NEUTROABS 6.6  --   --   --   HGB 13.4  < > 12.3 11.9*  HCT 38.3  < > 36.1 35.5*  MCV 88.9  --  89.1 90.6  PLT 243  --  194 195  < > = values in this interval not displayed. Cardiac Enzymes: No results found for this basename: CKTOTAL, CKMB, CKMBINDEX,  TROPONINI,  in the last 72 hours BNP: No results found for this basename: PROBNP,  in the last 72 hours D-Dimer: No results found for this basename: DDIMER,  in the last 72 hours CBG: No results found for this basename: GLUCAP,  in the last 72 hours Hemoglobin A1C: No results found for this basename: HGBA1C,  in the last 72 hours Fasting Lipid Panel: No results found for this basename: CHOL, HDL, LDLCALC, TRIG, CHOLHDL, LDLDIRECT,  in the last 72 hours Thyroid Function Tests: No results found for this basename: TSH, T4TOTAL, FREET4, T3FREE, THYROIDAB,  in the last 72 hours Anemia Panel: No results found for this basename: VITAMINB12, FOLATE, FERRITIN, TIBC, IRON, RETICCTPCT,  in the last 72 hours Coagulation: No results found for this basename: LABPROT, INR,  in the last 72 hours Urine Drug Screen: Drugs of Abuse  No results found for this basename: labopia,  cocainscrnur,  labbenz,  amphetmu,  thcu,  labbarb    Alcohol Level: No results found for this basename: ETH,  in the  last 72 hours Urinalysis:  Recent Labs  06/28/12 0430  COLORURINE YELLOW  LABSPEC 1.014  PHURINE 6.5  GLUCOSEU NEGATIVE  HGBUR LARGE*  BILIRUBINUR NEGATIVE  KETONESUR NEGATIVE  PROTEINUR NEGATIVE  UROBILINOGEN 0.2  NITRITE NEGATIVE  LEUKOCYTESUR TRACE*   Misc. Labs:   Micro: Recent Results (from the past 240 hour(s))  URINE CULTURE     Status: None   Collection Time    06/28/12  4:30 AM      Result Value Range Status   Specimen Description URINE, CLEAN CATCH   Final   Special Requests NONE   Final   Culture  Setup Time 06/28/2012 12:50   Final   Colony Count 60,000 COLONIES/ML   Final   Culture ESCHERICHIA COLI   Final   Report Status PENDING   Incomplete    Studies/Results: Ct Abdomen Pelvis Wo Contrast  06/28/2012  *RADIOLOGY REPORT*  Clinical Data: Left flank pain  CT ABDOMEN AND PELVIS WITHOUT CONTRAST  Technique:  Multidetector CT imaging of the abdomen and pelvis was performed  following the standard protocol without intravenous contrast.  Comparison: 12/17/2011  Findings: Limited images through the lung bases demonstrate no significant appreciable abnormality. The heart size is within normal limits. No pleural or pericardial effusion.  Organ abnormality/lesion detection is limited in the absence of intravenous contrast. Within this limitation,  hepatic granuloma. Unremarkable spleen.  Absent gallbladder.  No biliary ductal dilatation.  Unremarkable pancreas and adrenal glands.  Multiple nonobstructing left renal stones.  Tiny right renal calculus identified on coronal.  No hydronephrosis or hydroureter. No ureteral calculi.  Decompressed colon without overt evidence for colitis.  Normal appendix.  Small bowel loops are normal course and caliber.  No free intraperitoneal air or fluid.  No lymphadenopathy.  Normal caliber aorta and branch vessels.  Partially decompressed bladder.  Absent uterus.  No adnexal mass.  No acute osseous finding.  IMPRESSION: Nonobstructing renal stones.  No ureteral calculi or hydronephrosis.   Original Report Authenticated By: Jearld Lesch, M.D.     Medications:  Scheduled: . cefTRIAXone (ROCEPHIN)  IV  1 g Intravenous Q24H  . escitalopram  20 mg Oral QHS  . pantoprazole  40 mg Oral Daily   Continuous: . 0.9 % NaCl with KCl 20 mEq / L 75 mL/hr at 06/29/12 0827   ZOX:[id number], acetaminophen, diphenhydrAMINE, HYDROmorphone (DILAUDID) injection, ondansetron (ZOFRAN) IV, ondansetron, oxyCODONE  Assessment: Principal Problem:   Acute pyelonephritis Active Problems:   UTI (urinary tract infection)   Vomiting   Nephrolithiasis   Dehydration   1. Urinary tract infection, likely acute pyelonephritis given flank pain. Continue Rocephin. Urine culture is pending. 2. Nausea and vomiting, secondary to #1. Resolved. 3. No nephrolithiasis. No evidence of obstruction on the ultrasound. 4. Hypovolemic hypotension/dehydration. She does not  appear to be septic. Of note, she has a history of chronic hypotension which she said was worked up in the past and they found "nothing". Her blood pressure is better following IV fluid volume repletion, but is likely chronically low.  Plan:  1. Continue supportive treatment and IV fluids. We'll decrease the rate of IV fluids. 2. We'll check a TSH. 3. Consider discharge tomorrow if clinically improving.    LOS: 1 day   [name] 06/29/2012, 12:04 PM

## 2012-06-30 DIAGNOSIS — R310 Gross hematuria: Secondary | ICD-10-CM | POA: Diagnosis present

## 2012-06-30 DIAGNOSIS — K5909 Other constipation: Secondary | ICD-10-CM | POA: Diagnosis present

## 2012-06-30 LAB — URINE CULTURE: Colony Count: 60000

## 2012-06-30 MED ORDER — POLYETHYLENE GLYCOL 3350 17 G PO PACK
17.0000 g | PACK | Freq: Two times a day (BID) | ORAL | Status: DC
  Administered 2012-06-30: 17 g via ORAL
  Filled 2012-06-30: qty 1

## 2012-06-30 MED ORDER — PROMETHAZINE HCL 12.5 MG PO TABS: 12.5000 mg | ORAL_TABLET | Freq: Four times a day (QID) | ORAL | Status: DC | PRN

## 2012-06-30 MED ORDER — POLYETHYLENE GLYCOL 3350 17 G PO PACK: 17.0000 g | PACK | Freq: Two times a day (BID) | ORAL | Status: DC

## 2012-06-30 MED ORDER — SODIUM CHLORIDE 0.9 % IV BOLUS (SEPSIS)
500.0000 mL | Freq: Once | INTRAVENOUS | Status: AC
  Administered 2012-06-30: 500 mL via INTRAVENOUS

## 2012-06-30 MED ORDER — BISACODYL 10 MG RE SUPP
10.0000 mg | Freq: Once | RECTAL | Status: AC
  Administered 2012-06-30: 10 mg via RECTAL
  Filled 2012-06-30: qty 1

## 2012-06-30 MED ORDER — CEFUROXIME AXETIL 500 MG PO TABS: 500.0000 mg | ORAL_TABLET | Freq: Two times a day (BID) | ORAL | Status: DC

## 2012-06-30 MED ORDER — MAGNESIUM HYDROXIDE 400 MG/5ML PO SUSP
15.0000 mL | Freq: Every day | ORAL | Status: DC
  Administered 2012-06-30: 15 mL via ORAL
  Filled 2012-06-30: qty 30

## 2012-06-30 MED ORDER — OXYCODONE HCL 5 MG PO TABS: 5.0000 mg | ORAL_TABLET | ORAL | Status: DC | PRN

## 2012-06-30 NOTE — Discharge Summary (Signed)
Physician Discharge Summary  [name] ZOX:[id number] DOB: Apr 16, 1980 DOA: 06/28/2012  PCP: Tommy Rainwater, MD  Admit date: 06/28/2012 Discharge date: 06/30/2012  Time spent: Greater than 30 minutes  Recommendations for Outpatient Follow-up:  1. The patient was instructed to followup with her urologist at [facility].  Discharge Diagnoses:   1. Acute pyelonephritis, secondary to Escherichia coli. 2. Known history of nephrolithiasis. 3. Dehydration. 4. Hypotension secondary to hypovolemia and not sepsis. The patient has chronic low-normal blood pressures. 5. Nausea vomiting, secondary to #1, from constipation, and from pain. Resolved. 6. Intermittent gross hematuria, likely secondary to kidney stones. 7. History of migraine headaches. 8. Chronic constipation.  Discharge Condition: Improved.  Diet recommendation: Regular as tolerated.  Filed Weights   06/28/12 1500  Weight: 100.699 kg (222 lb)    History of present illness:   [name] is a 32 y.o. female with history of kidney stones in the past who presented to the emergency room with flank pain. Patient reported that she has had kidney stones in the past and has had lithotripsy done 2 years ago. Over the past month, she's had frequent trips to the emergency room for flank pain radiating to her groin. She usually receives pain and nausea medications in the emergency room, improves and returns home. She has not followed up with her urologist as of yet. She reported that  she had acute onset of left-sided flank pain, radiating into her groin and into her upper leg. This was associated with fevers, dysuria, vomiting. She was unable to keep anything down. She was seen in the emergency department and this concerns her underlying urinary tract infection. Since she was unable to keep down by mouth antibiotics, she was referred for admission.   Hospital Course:  She was started on IV Rocephin. Urine culture was ordered in the  emergency department. A CT scan was ordered in the emergency department. It revealed kidney stones, but no evidence of stones in her ureters and no evidence of hydronephrosis. Additional studies revealed normal lipase and normal liver transaminases. Also, her lactic acid level was within normal limits. Her nausea and vomiting was treated with as needed Zofran as needed Phenergan. IV fluid hydration was started. Her pain was treated with as needed IV analgesics. PPI therapy was continued. When her nausea and vomiting subsided, her diet was advanced.   Over the course of the hospitalization, she vacillated between improvement and becoming symptomatic. She had intermittent gross hematuria. It was believed that she was trying to pass a kidney stone. A followup KUB was ordered. It revealed no signs of obstruction, but it did reveal significant constipation. She reported a history of chronic constipation. Following the results of the KUB, she was given a suppository and several laxatives. This regimen proved to be successful.  She has a known history of chronic hypotension. She stated that she was evaluated by an echocardiogram in several other tests in the past, but no significant abnormalities were found. Her blood pressure generally runs in the low 90s at baseline. She was hydrated appropriately. Her blood pressure did improve with hydration owing to the fact that she was likely dehydrated from the acute pyelonephritis. Her TSH was within normal limits at 1.6.  Her urine culture grew out 60,000 colonies of Escherichia coli, sensitive to ceftriaxone. She received 3 days of IV antibiotics and was discharged on 7 more days of Ceftin twice a day. She was instructed to followup with her urologist for further evaluation and management.  Procedures:  None  Consultations:  None  Discharge Exam: Filed Vitals:   06/30/12 0504 06/30/12 0511 06/30/12 0622 06/30/12 1352  BP: 86/58 84/54 89/53  122/39  Pulse: 64    68  Temp: 98.6 F (37 C)   96.8 F (36 C)  TempSrc: Oral   Oral  Resp: 18   15  Height:      Weight:      SpO2: 95%   97%    General: Pleasant 32 year old Caucasian woman sitting up in bed, in no acute distress. Cardiovascular: S1, S2, with no murmurs rubs gallops. Respiratory: Clear to auscultation bilaterally.  Discharge Instructions  Discharge Orders   Future Orders Complete By Expires     Diet general  As directed     Discharge instructions  As directed     Comments:      Call for a follow up evaluation with your urologist.    Increase activity slowly  As directed         Medication List    TAKE these medications       cefUROXime 500 MG tablet  Commonly known as:  CEFTIN  Take 1 tablet (500 mg total) by mouth 2 (two) times daily. Antibiotic to be taken for 7 more days.     cholecalciferol 1000 UNITS tablet  Commonly known as:  VITAMIN D  Take 2,000 Units by mouth daily.     escitalopram 20 MG tablet  Commonly known as:  LEXAPRO  Take 20 mg by mouth at bedtime.     omeprazole 20 MG capsule  Commonly known as:  PRILOSEC  Take 20 mg by mouth at bedtime.     oxyCODONE 5 MG immediate release tablet  Commonly known as:  Oxy IR/ROXICODONE  Take 1-2 tablets (5-10 mg total) by mouth every 4 (four) hours as needed.     polyethylene glycol packet  Commonly known as:  MIRALAX / GLYCOLAX  Take 17 g by mouth 2 (two) times daily.     promethazine 12.5 MG tablet  Commonly known as:  PHENERGAN  Take 1 tablet (12.5 mg total) by mouth every 6 (six) hours as needed for nausea.       Allergies  Allergen Reactions  . Compazine Other (See Comments)    dystonia  . Metoclopramide Hcl Other (See Comments)    dystonia      The results of significant diagnostics from this hospitalization (including imaging, microbiology, ancillary and laboratory) are listed below for reference.    Significant Diagnostic Studies: Ct Abdomen Pelvis Wo Contrast  06/28/2012  *RADIOLOGY  REPORT*  Clinical Data: Left flank pain  CT ABDOMEN AND PELVIS WITHOUT CONTRAST  Technique:  Multidetector CT imaging of the abdomen and pelvis was performed following the standard protocol without intravenous contrast.  Comparison: 12/17/2011  Findings: Limited images through the lung bases demonstrate no significant appreciable abnormality. The heart size is within normal limits. No pleural or pericardial effusion.  Organ abnormality/lesion detection is limited in the absence of intravenous contrast. Within this limitation,  hepatic granuloma. Unremarkable spleen.  Absent gallbladder.  No biliary ductal dilatation.  Unremarkable pancreas and adrenal glands.  Multiple nonobstructing left renal stones.  Tiny right renal calculus identified on coronal.  No hydronephrosis or hydroureter. No ureteral calculi.  Decompressed colon without overt evidence for colitis.  Normal appendix.  Small bowel loops are normal course and caliber.  No free intraperitoneal air or fluid.  No lymphadenopathy.  Normal caliber aorta and branch vessels.  Partially decompressed bladder.  Absent uterus.  No adnexal mass.  No acute osseous finding.  IMPRESSION: Nonobstructing renal stones.  No ureteral calculi or hydronephrosis.   Original Report Authenticated By: Jearld Lesch, M.D.    Dg Abd 1 View  06/29/2012  *RADIOLOGY REPORT*  Clinical Data: Hematuria.  Kidney stents.  ABDOMEN - 1 VIEW  Comparison: CT abdomen pelvis 06/28/2012.  Findings: The patient has a very large volume stool throughout the colon which could obscure small stones.  A 0.4 cm calcification projecting over the mid pole of the left kidney is compatible with the nonobstructing stone seen on the prior CT scan.  Smaller nonobstructing stone in the lower pole of the left kidney is not visible on this examination.  No evidence of ureteral stone is identified.  Cholecystectomy clips are noted.  IMPRESSION:  1.  0.4 cm nonobstructing stone left kidney. 2.  Very large volume  of stool throughout the colon consistent with constipation.   Original Report Authenticated By: Holley Dexter, M.D.     Microbiology: Recent Results (from the past 240 hour(s))  URINE CULTURE     Status: None   Collection Time    06/28/12  4:30 AM      Result Value Range Status   Specimen Description URINE, CLEAN CATCH   Final   Special Requests NONE   Final   Culture  Setup Time 06/28/2012 12:50   Final   Colony Count 60,000 COLONIES/ML   Final   Culture ESCHERICHIA COLI   Final   Report Status 06/30/2012 FINAL   Final   Organism ID, Bacteria ESCHERICHIA COLI   Final     Labs: Basic Metabolic Panel:  Recent Labs Lab 06/28/12 0449 06/28/12 1101 06/29/12 0416  NA 141  --  138  K 3.7  --  4.1  CL 106  --  106  CO2  --   --  24  GLUCOSE 89  --  89  BUN 11  --  5*  CREATININE 0.70 0.63 0.66  CALCIUM  --   --  8.7   Liver Function Tests:  Recent Labs Lab 06/28/12 1101  AST 13  ALT 8  ALKPHOS 79  BILITOT 0.2*  PROT 6.6  ALBUMIN 3.4*    Recent Labs Lab 06/28/12 1101  LIPASE 24   No results found for this basename: AMMONIA,  in the last 168 hours CBC:  Recent Labs Lab 06/28/12 0255 06/28/12 0449 06/28/12 1101 06/29/12 0416  WBC 12.3*  --  9.9 7.6  NEUTROABS 6.6  --   --   --   HGB 13.4 13.6 12.3 11.9*  HCT 38.3 40.0 36.1 35.5*  MCV 88.9  --  89.1 90.6  PLT 243  --  194 195   Cardiac Enzymes: No results found for this basename: CKTOTAL, CKMB, CKMBINDEX, TROPONINI,  in the last 168 hours BNP: BNP (last 3 results) No results found for this basename: PROBNP,  in the last 8760 hours CBG: No results found for this basename: GLUCAP,  in the last 168 hours     Signed:  Pyper Olexa  [facility] 06/30/2012, 8:33 PM

## 2012-06-30 NOTE — Progress Notes (Signed)
Patient's BP 84/54 manually this morning. She is not symptomatic and has had low blood pressures since admission. Physician on call made aware and orders given for a 500cc NS bolus. Patient's current BP is 89/53, so it is trending upward with still 120cc left in her bolus. Will continue to monitor.

## 2012-07-01 ENCOUNTER — Encounter (HOSPITAL_COMMUNITY): Payer: Self-pay | Admitting: *Deleted

## 2012-07-01 ENCOUNTER — Encounter (HOSPITAL_COMMUNITY): Payer: Self-pay | Admitting: Internal Medicine

## 2012-07-01 ENCOUNTER — Other Ambulatory Visit: Payer: Self-pay

## 2012-07-01 ENCOUNTER — Emergency Department (HOSPITAL_COMMUNITY)
Admission: EM | Admit: 2012-07-01 | Discharge: 2012-07-01 | Disposition: A | Discharge status: Home / Self Care | Payer: BC Managed Care – PPO | Attending: Emergency Medicine | Admitting: Emergency Medicine

## 2012-07-01 DIAGNOSIS — R61 Generalized hyperhidrosis: Secondary | ICD-10-CM | POA: Insufficient documentation

## 2012-07-01 DIAGNOSIS — E669 Obesity, unspecified: Secondary | ICD-10-CM | POA: Insufficient documentation

## 2012-07-01 DIAGNOSIS — R109 Unspecified abdominal pain: Secondary | ICD-10-CM | POA: Insufficient documentation

## 2012-07-01 DIAGNOSIS — Z9089 Acquired absence of other organs: Secondary | ICD-10-CM | POA: Insufficient documentation

## 2012-07-01 DIAGNOSIS — R55 Syncope and collapse: Secondary | ICD-10-CM

## 2012-07-01 DIAGNOSIS — Z8719 Personal history of other diseases of the digestive system: Secondary | ICD-10-CM | POA: Insufficient documentation

## 2012-07-01 DIAGNOSIS — F17200 Nicotine dependence, unspecified, uncomplicated: Secondary | ICD-10-CM | POA: Insufficient documentation

## 2012-07-01 DIAGNOSIS — R110 Nausea: Secondary | ICD-10-CM | POA: Insufficient documentation

## 2012-07-01 DIAGNOSIS — Z90710 Acquired absence of both cervix and uterus: Secondary | ICD-10-CM | POA: Insufficient documentation

## 2012-07-01 DIAGNOSIS — Z8679 Personal history of other diseases of the circulatory system: Secondary | ICD-10-CM | POA: Insufficient documentation

## 2012-07-01 DIAGNOSIS — R319 Hematuria, unspecified: Secondary | ICD-10-CM | POA: Insufficient documentation

## 2012-07-01 DIAGNOSIS — Z87442 Personal history of urinary calculi: Secondary | ICD-10-CM | POA: Insufficient documentation

## 2012-07-01 DIAGNOSIS — N12 Tubulo-interstitial nephritis, not specified as acute or chronic: Secondary | ICD-10-CM

## 2012-07-01 DIAGNOSIS — R42 Dizziness and giddiness: Secondary | ICD-10-CM | POA: Insufficient documentation

## 2012-07-01 DIAGNOSIS — Z79899 Other long term (current) drug therapy: Secondary | ICD-10-CM | POA: Insufficient documentation

## 2012-07-01 DIAGNOSIS — R6883 Chills (without fever): Secondary | ICD-10-CM | POA: Insufficient documentation

## 2012-07-01 LAB — URINALYSIS, ROUTINE W REFLEX MICROSCOPIC
Glucose, UA: NEGATIVE mg/dL
Ketones, ur: 15 mg/dL — AB
Nitrite: NEGATIVE
Protein, ur: 100 mg/dL — AB
Specific Gravity, Urine: 1.022 (ref 1.005–1.030)
Urobilinogen, UA: 0.2 mg/dL (ref 0.0–1.0)
pH: 8.5 — ABNORMAL HIGH (ref 5.0–8.0)

## 2012-07-01 LAB — CBC WITH DIFFERENTIAL/PLATELET
Basophils Absolute: 0.1 10*3/uL (ref 0.0–0.1)
Basophils Relative: 1 % (ref 0–1)
Eosinophils Absolute: 0.2 10*3/uL (ref 0.0–0.7)
Eosinophils Relative: 2 % (ref 0–5)
HCT: 41.7 % (ref 36.0–46.0)
Hemoglobin: 15 g/dL (ref 12.0–15.0)
Lymphocytes Relative: 30 % (ref 12–46)
Lymphs Abs: 3.2 10*3/uL (ref 0.7–4.0)
MCH: 31.2 pg (ref 26.0–34.0)
MCHC: 36 g/dL (ref 30.0–36.0)
MCV: 86.7 fL (ref 78.0–100.0)
Monocytes Absolute: 0.6 10*3/uL (ref 0.1–1.0)
Monocytes Relative: 6 % (ref 3–12)
Neutro Abs: 6.8 10*3/uL (ref 1.7–7.7)
Neutrophils Relative %: 63 % (ref 43–77)
Platelets: 234 10*3/uL (ref 150–400)
RBC: 4.81 MIL/uL (ref 3.87–5.11)
RDW: 13.1 % (ref 11.5–15.5)
WBC: 10.9 10*3/uL — ABNORMAL HIGH (ref 4.0–10.5)

## 2012-07-01 LAB — BASIC METABOLIC PANEL
BUN: 8 mg/dL (ref 6–23)
CO2: 26 mEq/L (ref 19–32)
Calcium: 10.5 mg/dL (ref 8.4–10.5)
Chloride: 101 mEq/L (ref 96–112)
Creatinine, Ser: 0.72 mg/dL (ref 0.50–1.10)
GFR calc Af Amer: >90 mL/min (ref >90)
GFR calc non Af Amer: >90 mL/min (ref >90)
Glucose, Bld: 92 mg/dL (ref 70–99)
Potassium: 4.7 mEq/L (ref 3.5–5.1)
Sodium: 135 mEq/L (ref 135–145)

## 2012-07-01 LAB — URINE MICROSCOPIC-ADD ON

## 2012-07-01 MED ORDER — HYDROMORPHONE HCL PF 1 MG/ML IJ SOLN
1.0000 mg | Freq: Once | INTRAMUSCULAR | Status: AC
  Administered 2012-07-01: 1 mg via INTRAVENOUS
  Filled 2012-07-01: qty 1

## 2012-07-01 MED ORDER — MORPHINE SULFATE 4 MG/ML IJ SOLN
4.0000 mg | Freq: Once | INTRAMUSCULAR | Status: AC
  Administered 2012-07-01: 4 mg via INTRAVENOUS
  Filled 2012-07-01: qty 1

## 2012-07-01 MED ORDER — ONDANSETRON HCL 4 MG/2ML IJ SOLN
4.0000 mg | Freq: Once | INTRAMUSCULAR | Status: AC
  Administered 2012-07-01: 4 mg via INTRAVENOUS
  Filled 2012-07-01: qty 2

## 2012-07-01 MED ORDER — SODIUM CHLORIDE 0.9 % IV BOLUS (SEPSIS)
1000.0000 mL | Freq: Once | INTRAVENOUS | Status: AC
  Administered 2012-07-01: 1000 mL via INTRAVENOUS

## 2012-07-01 NOTE — ED Provider Notes (Signed)
History     CSN: [id number]  Arrival date & time 07/01/12  1637   First MD Initiated Contact with Patient 07/01/12 1656      Chief Complaint  Patient presents with  . Loss of Consciousness    (Consider location/radiation/quality/duration/timing/severity/associated sxs/prior treatment) HPI Comments: 32 y/o female with a PMHx of kidney stones and pyelonephritis presents to the ED after a near-syncopal episode earlier today while taking a shower. Patient was discharged from the hospital yesterday after spending almost 3 days for acute pyelonephritis secondary to kidney stones. States her left flank pain had been slightly improved, however today began to worsen causing her to feel nauseated. Had severe pain when she got in the shower, felt lightheaded, vision went dark for about 5 seconds, grabbed onto the shower curtain and sat down. No actual LOC, she did not fall and hit her head. Pain has been coming and going in waves rated 10/10. Oxycodone given for pain is only providing mild relief. She is on ceftin for her pyelonephritis. States she is still passing blood in her urine which she was told would happen. Admits to cold sweats. States when she was in the hospital her blood pressure was low in the 80's/50's and needed many fluids.  Patient is a 32 y.o. female presenting with syncope. The history is provided by the patient.  Loss of Consciousness  Associated symptoms include diaphoresis, dizziness, light-headedness and nausea. Pertinent negatives include chest pain, fever and vomiting.    Past Medical History  Diagnosis Date  . Migraine   . Kidney stones   . Gallstones   . Acute pyelonephritis 06/29/2012    Past Surgical History  Procedure Laterality Date  . Abdominal hysterectomy    . Tonsillectomy    . Lithotripsy    . Cholecystectomy  03/11/2011    Procedure: LAPAROSCOPIC CHOLECYSTECTOMY WITH INTRAOPERATIVE CHOLANGIOGRAM;  Surgeon: Atilano Ina, MD;  Location: [facility] OR;  Service:  General;  Laterality: N/A;  . Cholecystectomy, laparoscopic    . Orif ankle fracture  12/25/2011    Procedure: OPEN REDUCTION INTERNAL FIXATION (ORIF) ANKLE FRACTURE;  Surgeon: Javier Docker, MD;  Location: [facility] ORS;  Service: Orthopedics;  Laterality: Left;  . Cast application  12/25/2011    Procedure: CAST APPLICATION;  Surgeon: Javier Docker, MD;  Location: [facility] ORS;  Service: Orthopedics;  Laterality: Left;    Family History  Problem Relation Age of Onset  . Diabetes Mother   . Diabetes Father   . Heart failure Father   . Hypertension Father   . Pulmonary fibrosis Father     History  Substance Use Topics  . Smoking status: Current Every Day Smoker -- 0.50 packs/day    Types: Cigarettes  . Smokeless tobacco: Not on file  . Alcohol Use: No     Comment: occaisional    OB History   Grav Para Term Preterm Abortions TAB SAB Ect Mult Living                  Review of Systems  Constitutional: Positive for chills and diaphoresis. Negative for fever.  Respiratory: Negative for shortness of breath.   Cardiovascular: Positive for syncope. Negative for chest pain.  Gastrointestinal: Positive for nausea. Negative for vomiting.  Genitourinary: Positive for hematuria and flank pain.  Neurological: Positive for dizziness and light-headedness. Negative for syncope.  All other systems reviewed and are negative.    Allergies  Compazine and Metoclopramide hcl  Home Medications   Current Outpatient Rx  Name  Route  Sig  Dispense  Refill  . cefUROXime (CEFTIN) 500 MG tablet   Oral   Take 1 tablet (500 mg total) by mouth 2 (two) times daily. Antibiotic to be taken for 7 more days.   14 tablet   0   . cholecalciferol (VITAMIN D) 1000 UNITS tablet   Oral   Take 2,000 Units by mouth daily.          Marland Kitchen escitalopram (LEXAPRO) 20 MG tablet   Oral   Take 20 mg by mouth at bedtime.          Marland Kitchen omeprazole (PRILOSEC) 20 MG capsule   Oral   Take 20 mg by mouth at bedtime.           Marland Kitchen oxyCODONE (OXY IR/ROXICODONE) 5 MG immediate release tablet   Oral   Take 1-2 tablets (5-10 mg total) by mouth every 4 (four) hours as needed.   30 tablet   0   . polyethylene glycol (MIRALAX / GLYCOLAX) packet   Oral   Take 17 g by mouth 2 (two) times daily.         . promethazine (PHENERGAN) 12.5 MG tablet   Oral   Take 1 tablet (12.5 mg total) by mouth every 6 (six) hours as needed for nausea.   30 tablet   0     BP 116/75  Pulse 106  Temp(Src) 98.4 F (36.9 C) (Oral)  Resp 18  SpO2 96%  Physical Exam  Nursing note and vitals reviewed. Constitutional: She is oriented to person, place, and time. She appears well-developed. No distress.  Obese  HENT:  Head: Normocephalic and atraumatic.  Mouth/Throat: Oropharynx is clear and moist.  Eyes: Conjunctivae and EOM are normal. Pupils are equal, round, and reactive to light. No scleral icterus.  Neck: Normal range of motion. Neck supple.  Cardiovascular: Normal rate, regular rhythm, normal heart sounds and intact distal pulses.   Pulmonary/Chest: Effort normal and breath sounds normal. No respiratory distress. She has no decreased breath sounds.  Abdominal: Soft. Normal appearance and bowel sounds are normal. She exhibits no distension and no mass. There is tenderness. There is no rigidity, no rebound, no guarding and no CVA tenderness.    Musculoskeletal: Normal range of motion. She exhibits no edema.  Neurological: She is alert and oriented to person, place, and time. She has normal strength. No cranial nerve deficit or sensory deficit. She displays a negative Romberg sign. Coordination and gait normal.  Skin: Skin is warm.  Clammy  Psychiatric: She has a normal mood and affect. Her behavior is normal.    ED Course  Procedures (including critical care time)  Labs Reviewed  URINALYSIS, ROUTINE W REFLEX MICROSCOPIC - Abnormal; Notable for the following:    Color, Urine RED (*)    APPearance CLOUDY (*)    pH 8.5  (*)    Hgb urine dipstick LARGE (*)    Bilirubin Urine SMALL (*)    Ketones, ur 15 (*)    Protein, ur 100 (*)    Leukocytes, UA SMALL (*)    All other components within normal limits  CBC WITH DIFFERENTIAL - Abnormal; Notable for the following:    WBC 10.9 (*)    All other components within normal limits  URINE MICROSCOPIC-ADD ON - Abnormal; Notable for the following:    Squamous Epithelial / LPF FEW (*)    Bacteria, UA MANY (*)    All other components within normal limits  BASIC  METABOLIC PANEL   No results found.   No diagnosis found.    MDM  32 y/o female discharged from hospital yesterday for pyelonephritis presenting with near-syncopal episode and continuing pain. BP 115/67. Vitals stable. No tachycardia or tachypnea. Afebrile. She is on ceftin for her pyelonephritis. Will give fluids, pain control with morphine, zofran for nausea. Obtaining labs- cbc, bmp, urine. Will check orthostatic vital signs. 6:23 PM Labs unremarkable. U/A with hematuria which has been present. She is not orthostatic. Nausea subsided with zofran, no longer lightheaded with fluids. Pain decreased to 6/10, but states she is still uncomfortable. Will given 1 mg dilaudid and re-assess, patient will most likely be discharged home. Vitals stable.  6:51 PM Pain greatly improved after receiving dilaudid. Skin is no longer clammy. She feels as if she can go home- has oxycodone and phenergan at home. Advised her to increase fluid intake. She will f/u with Dr. Patsi Sears as advised after she was discharged from the hospital. Return precautions discussed. Patient states understanding of plan and is agreeable.         Trevor Mace, PA-C 07/01/12 [phone]

## 2012-07-01 NOTE — ED Notes (Addendum)
Pt reports recently being admitting to hospital for kidney stones and pyelonephrotis, was dc home yesterday and had syncopal episode today while taking a shower. Having nausea and still has left flank pain. ekg done at triage, no acute distress noted.

## 2012-07-07 NOTE — ED Provider Notes (Signed)
Medical screening examination/treatment/procedure(s) were performed by non-physician practitioner and as supervising physician I was immediately available for consultation/collaboration.   Suzi Roots, MD 07/07/12 1056

## 2012-07-15 ENCOUNTER — Emergency Department (HOSPITAL_COMMUNITY)
Admission: EM | Admit: 2012-07-15 | Discharge: 2012-07-15 | Disposition: A | Discharge status: Home / Self Care | Payer: BC Managed Care – PPO | Attending: Emergency Medicine | Admitting: Emergency Medicine

## 2012-07-15 ENCOUNTER — Encounter (HOSPITAL_COMMUNITY): Payer: Self-pay | Admitting: Emergency Medicine

## 2012-07-15 DIAGNOSIS — Z90710 Acquired absence of both cervix and uterus: Secondary | ICD-10-CM | POA: Insufficient documentation

## 2012-07-15 DIAGNOSIS — Z8719 Personal history of other diseases of the digestive system: Secondary | ICD-10-CM | POA: Insufficient documentation

## 2012-07-15 DIAGNOSIS — N200 Calculus of kidney: Secondary | ICD-10-CM

## 2012-07-15 DIAGNOSIS — Z9089 Acquired absence of other organs: Secondary | ICD-10-CM | POA: Insufficient documentation

## 2012-07-15 DIAGNOSIS — Z9889 Other specified postprocedural states: Secondary | ICD-10-CM | POA: Insufficient documentation

## 2012-07-15 DIAGNOSIS — R112 Nausea with vomiting, unspecified: Secondary | ICD-10-CM | POA: Insufficient documentation

## 2012-07-15 DIAGNOSIS — Z79899 Other long term (current) drug therapy: Secondary | ICD-10-CM | POA: Insufficient documentation

## 2012-07-15 DIAGNOSIS — R319 Hematuria, unspecified: Secondary | ICD-10-CM | POA: Insufficient documentation

## 2012-07-15 DIAGNOSIS — Z8679 Personal history of other diseases of the circulatory system: Secondary | ICD-10-CM | POA: Insufficient documentation

## 2012-07-15 DIAGNOSIS — F17200 Nicotine dependence, unspecified, uncomplicated: Secondary | ICD-10-CM | POA: Insufficient documentation

## 2012-07-15 LAB — POCT I-STAT, CHEM 8
BUN: 10 mg/dL (ref 6–23)
Calcium, Ion: 1.23 mmol/L (ref 1.12–1.23)
Chloride: 106 mEq/L (ref 96–112)
Creatinine, Ser: 0.8 mg/dL (ref 0.50–1.10)
Glucose, Bld: 92 mg/dL (ref 70–99)
HCT: 43 % (ref 36.0–46.0)
Hemoglobin: 14.6 g/dL (ref 12.0–15.0)
Potassium: 3.9 mEq/L (ref 3.5–5.1)
Sodium: 141 mEq/L (ref 135–145)
TCO2: 27 mmol/L (ref 0–100)

## 2012-07-15 LAB — URINALYSIS, ROUTINE W REFLEX MICROSCOPIC
Glucose, UA: NEGATIVE mg/dL
Ketones, ur: NEGATIVE mg/dL
Nitrite: NEGATIVE
Protein, ur: 30 mg/dL — AB
Specific Gravity, Urine: 1.016 (ref 1.005–1.030)
Urobilinogen, UA: 1 mg/dL (ref 0.0–1.0)
pH: 6 (ref 5.0–8.0)

## 2012-07-15 LAB — URINE MICROSCOPIC-ADD ON

## 2012-07-15 MED ORDER — KETOROLAC TROMETHAMINE 30 MG/ML IJ SOLN
30.0000 mg | Freq: Once | INTRAMUSCULAR | Status: AC
  Administered 2012-07-15: 30 mg via INTRAVENOUS
  Filled 2012-07-15: qty 1

## 2012-07-15 MED ORDER — HYDROMORPHONE HCL PF 1 MG/ML IJ SOLN
1.0000 mg | Freq: Once | INTRAMUSCULAR | Status: AC
  Administered 2012-07-15: 1 mg via INTRAVENOUS
  Filled 2012-07-15: qty 1

## 2012-07-15 MED ORDER — ONDANSETRON HCL 4 MG/2ML IJ SOLN
4.0000 mg | INTRAMUSCULAR | Status: AC
  Administered 2012-07-15: 4 mg via INTRAVENOUS
  Filled 2012-07-15: qty 2

## 2012-07-15 MED ORDER — SODIUM CHLORIDE 0.9 % IV BOLUS (SEPSIS)
1000.0000 mL | Freq: Once | INTRAVENOUS | Status: AC
  Administered 2012-07-15: 1000 mL via INTRAVENOUS

## 2012-07-15 MED ORDER — DIPHENHYDRAMINE HCL 50 MG/ML IJ SOLN
25.0000 mg | Freq: Once | INTRAMUSCULAR | Status: AC
  Administered 2012-07-15: 25 mg via INTRAVENOUS
  Filled 2012-07-15: qty 1

## 2012-07-15 MED ORDER — OXYCODONE HCL 5 MG PO TABS: 5.0000 mg | ORAL_TABLET | Freq: Four times a day (QID) | ORAL | Status: DC | PRN

## 2012-07-15 NOTE — ED Provider Notes (Signed)
History     CSN: [id number]  Arrival date & time 07/15/12  0113   None     Chief Complaint  Patient presents with  . Flank Pain    (Consider location/radiation/quality/duration/timing/severity/associated sxs/prior treatment) HPI Comments: Patient is a 32 year old female with a history of kidney stones and pyelonephritis who presents for left flank pain with onset 6 hours ago. Patient states the pain is sharp, constant, radiating to her left inguinal region, and waxing and waning in severity. Patient admits to associated nausea and nonbloody, nonbilious emesis. Patient denies fever, chest pain, shortness of breath, abdominal pain, diarrhea, melena or hematochezia, vaginal discharge, and numbness or tingling in her extremities. Patient endorses urology followup tomorrow.  Patient has a history of ureteral stent placement, 2 lithotripsy procedures, and 1 basket extraction all for L sided nephrolithiasis.  The history is provided by the patient. No language interpreter was used.    Past Medical History  Diagnosis Date  . Migraine   . Kidney stones   . Gallstones   . Acute pyelonephritis 06/29/2012    Past Surgical History  Procedure Laterality Date  . Abdominal hysterectomy    . Tonsillectomy    . Lithotripsy    . Cholecystectomy  03/11/2011    Procedure: LAPAROSCOPIC CHOLECYSTECTOMY WITH INTRAOPERATIVE CHOLANGIOGRAM;  Surgeon: Atilano Ina, MD;  Location: [facility] OR;  Service: General;  Laterality: N/A;  . Cholecystectomy, laparoscopic    . Orif ankle fracture  12/25/2011    Procedure: OPEN REDUCTION INTERNAL FIXATION (ORIF) ANKLE FRACTURE;  Surgeon: Javier Docker, MD;  Location: [facility] ORS;  Service: Orthopedics;  Laterality: Left;  . Cast application  12/25/2011    Procedure: CAST APPLICATION;  Surgeon: Javier Docker, MD;  Location: [facility] ORS;  Service: Orthopedics;  Laterality: Left;    Family History  Problem Relation Age of Onset  . Diabetes Mother   . Diabetes Father   .  Heart failure Father   . Hypertension Father   . Pulmonary fibrosis Father     History  Substance Use Topics  . Smoking status: Current Every Day Smoker -- 0.50 packs/day    Types: Cigarettes  . Smokeless tobacco: Not on file  . Alcohol Use: No     Comment: occaisional    OB History   Grav Para Term Preterm Abortions TAB SAB Ect Mult Living                  Review of Systems  Constitutional: Negative for fever.  Gastrointestinal: Positive for nausea and vomiting. Negative for abdominal pain.  Genitourinary: Positive for hematuria and flank pain.  Neurological: Negative for numbness.  All other systems reviewed and are negative.    Allergies  Compazine and Metoclopramide hcl  Home Medications   Current Outpatient Rx  Name  Route  Sig  Dispense  Refill  . cholecalciferol (VITAMIN D) 1000 UNITS tablet   Oral   Take 2,000 Units by mouth daily.          Marland Kitchen escitalopram (LEXAPRO) 20 MG tablet   Oral   Take 20 mg by mouth at bedtime.          Marland Kitchen omeprazole (PRILOSEC) 20 MG capsule   Oral   Take 20 mg by mouth at bedtime.          . promethazine (PHENERGAN) 12.5 MG tablet   Oral   Take 1 tablet (12.5 mg total) by mouth every 6 (six) hours as needed for nausea.   30  tablet   0   . oxyCODONE (OXY IR/ROXICODONE) 5 MG immediate release tablet   Oral   Take 1-2 tablets (5-10 mg total) by mouth every 6 (six) hours as needed.   10 tablet   0     BP 113/76  Pulse 65  Temp(Src) 97.7 F (36.5 C) (Oral)  Resp 20  Ht 5\' 7"  (1.702 m)  Wt 220 lb (99.791 kg)  BMI 34.45 kg/m2  SpO2 97%  Physical Exam  Nursing note and vitals reviewed. Constitutional: She is oriented to person, place, and time. She appears well-developed and well-nourished. No distress.  HENT:  Head: Normocephalic and atraumatic.  Mouth/Throat: Oropharynx is clear and moist. No oropharyngeal exudate.  Eyes: Conjunctivae and EOM are normal. Pupils are equal, round, and reactive to light. No  scleral icterus.  Neck: Normal range of motion. Neck supple.  Cardiovascular: Normal rate, regular rhythm, normal heart sounds and intact distal pulses.   Pulmonary/Chest: Effort normal and breath sounds normal. No respiratory distress. She has no wheezes. She has no rales.  Abdominal: Soft. She exhibits no distension. There is no tenderness. There is no rebound and no guarding.  Positive left CVA tenderness. No peritoneal signs.  Musculoskeletal: Normal range of motion. She exhibits no edema.  Neurological: She is alert and oriented to person, place, and time.  Skin: Skin is warm and dry. No rash noted. She is not diaphoretic. No erythema. No pallor.  Psychiatric: She has a normal mood and affect. Her behavior is normal.    ED Course  Procedures (including critical care time)  Labs Reviewed  URINALYSIS, ROUTINE W REFLEX MICROSCOPIC - Abnormal; Notable for the following:    Color, Urine RED (*)    APPearance CLOUDY (*)    Hgb urine dipstick LARGE (*)    Bilirubin Urine SMALL (*)    Protein, ur 30 (*)    Leukocytes, UA MODERATE (*)    All other components within normal limits  URINE MICROSCOPIC-ADD ON  POCT I-STAT, CHEM 8   No results found.   1. Nephrolithiasis     MDM  Patient presents for left flank pain with onset 6 hours ago with nausea and NB/NB emesis. Patient with history of nephrolithiasis requiring stenting, basket extraction, and lithotripsy. Patient endorses followup with urology tomorrow. Left-sided CVA tenderness appreciated on physical exam without abdominal tenderness or peritoneal signs. Patient is afebrile and hemodynamically stable. Urinalysis with TMTC red blood cells consistent with nephrolithiasis. Kidney function preserved. Symptoms being managed with IV fluids, IV Zofran, and Dilaudid.  Patient states that symptoms have improved with fluids and pain medicine; down to 7/10 from 10/10 pain. Have ordered Toradol and second dose of Dilaudid for continued pain  management. Will attempt to get pain well under control prior to discharge. Patient with no complaints at this time.  Patient states that pain has decreased to a 2/10. She is remained hemodynamically stable and is appropriate for discharge with urology followup this morning. Patient given Roxicodone for pain control; endorses phenergan and Flomax at home. Indications for ED return discussed. Patient states comfort and understanding with this discharge plan with no unaddressed concerns.   Filed Vitals:   07/15/12 0121 07/15/12 0327  BP: 117/66 113/76  Pulse: 82 65  Temp: 98 F (36.7 C) 97.7 F (36.5 C)  TempSrc: Oral Oral  Resp: 20   Height: 5\' 7"  (1.702 m)   Weight: 220 lb (99.791 kg)   SpO2: 97% 97%       Tresa Endo  Jobe Gibbon, [place] 07/15/12 0448

## 2012-07-15 NOTE — ED Provider Notes (Signed)
Medical screening examination/treatment/procedure(s) were performed by non-physician practitioner and as supervising physician I was immediately available for consultation/collaboration.  Olivia Mackie, MD 07/15/12 [phone]

## 2012-07-15 NOTE — ED Notes (Signed)
Pt presents with c/o left flank pain  Pt states she has known kidney stones  Pt states tonight she has been vomiting for the past 2 hrs  Pt states she was a pt in [place] 2 weeks ago for pyelonephritis  Pt states she an appt with the urologist as a walk in tomorrow  Pt states she is out of pain medication

## 2012-07-20 ENCOUNTER — Encounter (HOSPITAL_COMMUNITY): Payer: Self-pay | Admitting: Emergency Medicine

## 2012-07-20 ENCOUNTER — Emergency Department (HOSPITAL_COMMUNITY)
Admission: EM | Admit: 2012-07-20 | Discharge: 2012-07-20 | Disposition: A | Discharge status: Home / Self Care | Payer: BC Managed Care – PPO | Attending: Emergency Medicine | Admitting: Emergency Medicine

## 2012-07-20 DIAGNOSIS — Z8679 Personal history of other diseases of the circulatory system: Secondary | ICD-10-CM | POA: Insufficient documentation

## 2012-07-20 DIAGNOSIS — F17200 Nicotine dependence, unspecified, uncomplicated: Secondary | ICD-10-CM | POA: Insufficient documentation

## 2012-07-20 DIAGNOSIS — N200 Calculus of kidney: Secondary | ICD-10-CM

## 2012-07-20 DIAGNOSIS — Z90710 Acquired absence of both cervix and uterus: Secondary | ICD-10-CM | POA: Insufficient documentation

## 2012-07-20 DIAGNOSIS — Z8719 Personal history of other diseases of the digestive system: Secondary | ICD-10-CM | POA: Insufficient documentation

## 2012-07-20 DIAGNOSIS — R319 Hematuria, unspecified: Secondary | ICD-10-CM | POA: Insufficient documentation

## 2012-07-20 DIAGNOSIS — Z87448 Personal history of other diseases of urinary system: Secondary | ICD-10-CM | POA: Insufficient documentation

## 2012-07-20 DIAGNOSIS — Z3202 Encounter for pregnancy test, result negative: Secondary | ICD-10-CM | POA: Insufficient documentation

## 2012-07-20 DIAGNOSIS — Z8781 Personal history of (healed) traumatic fracture: Secondary | ICD-10-CM | POA: Insufficient documentation

## 2012-07-20 DIAGNOSIS — Z79899 Other long term (current) drug therapy: Secondary | ICD-10-CM | POA: Insufficient documentation

## 2012-07-20 DIAGNOSIS — Z9089 Acquired absence of other organs: Secondary | ICD-10-CM | POA: Insufficient documentation

## 2012-07-20 LAB — URINALYSIS, ROUTINE W REFLEX MICROSCOPIC
Bilirubin Urine: NEGATIVE
Glucose, UA: NEGATIVE mg/dL
Ketones, ur: NEGATIVE mg/dL
Nitrite: NEGATIVE
Protein, ur: NEGATIVE mg/dL
Specific Gravity, Urine: 1.017 (ref 1.005–1.030)
Urobilinogen, UA: 0.2 mg/dL (ref 0.0–1.0)
pH: 7.5 (ref 5.0–8.0)

## 2012-07-20 LAB — CBC WITH DIFFERENTIAL/PLATELET
Basophils Absolute: 0.1 10*3/uL (ref 0.0–0.1)
Basophils Relative: 1 % (ref 0–1)
Eosinophils Absolute: 0.2 10*3/uL (ref 0.0–0.7)
Eosinophils Relative: 2 % (ref 0–5)
HCT: 38.9 % (ref 36.0–46.0)
Hemoglobin: 13.3 g/dL (ref 12.0–15.0)
Lymphocytes Relative: 34 % (ref 12–46)
Lymphs Abs: 3.8 10*3/uL (ref 0.7–4.0)
MCH: 31.1 pg (ref 26.0–34.0)
MCHC: 34.2 g/dL (ref 30.0–36.0)
MCV: 90.9 fL (ref 78.0–100.0)
Monocytes Absolute: 0.6 10*3/uL (ref 0.1–1.0)
Monocytes Relative: 6 % (ref 3–12)
Neutro Abs: 6.4 10*3/uL (ref 1.7–7.7)
Neutrophils Relative %: 57 % (ref 43–77)
Platelets: 245 10*3/uL (ref 150–400)
RBC: 4.28 MIL/uL (ref 3.87–5.11)
RDW: 13.4 % (ref 11.5–15.5)
WBC: 11.2 10*3/uL — ABNORMAL HIGH (ref 4.0–10.5)

## 2012-07-20 LAB — COMPREHENSIVE METABOLIC PANEL
ALT: 12 U/L (ref 0–35)
AST: 17 U/L (ref 0–37)
Albumin: 3.8 g/dL (ref 3.5–5.2)
Alkaline Phosphatase: 88 U/L (ref 39–117)
BUN: 17 mg/dL (ref 6–23)
CO2: 26 mEq/L (ref 19–32)
Calcium: 9.4 mg/dL (ref 8.4–10.5)
Chloride: 100 mEq/L (ref 96–112)
Creatinine, Ser: 0.73 mg/dL (ref 0.50–1.10)
GFR calc Af Amer: >90 mL/min (ref >90)
GFR calc non Af Amer: >90 mL/min (ref >90)
Glucose, Bld: 93 mg/dL (ref 70–99)
Potassium: 3.6 mEq/L (ref 3.5–5.1)
Sodium: 135 mEq/L (ref 135–145)
Total Bilirubin: 0.3 mg/dL (ref 0.3–1.2)
Total Protein: 7.2 g/dL (ref 6.0–8.3)

## 2012-07-20 LAB — URINE MICROSCOPIC-ADD ON

## 2012-07-20 LAB — PREGNANCY, URINE: Preg Test, Ur: NEGATIVE

## 2012-07-20 MED ORDER — ONDANSETRON 4 MG PO TBDP
ORAL_TABLET | ORAL | Status: AC
  Administered 2012-07-20: 8 mg via ORAL
  Filled 2012-07-20: qty 2

## 2012-07-20 MED ORDER — HYDROMORPHONE HCL PF 1 MG/ML IJ SOLN
1.0000 mg | Freq: Once | INTRAMUSCULAR | Status: AC
  Administered 2012-07-20: 1 mg via INTRAVENOUS
  Filled 2012-07-20: qty 1

## 2012-07-20 MED ORDER — OXYCODONE HCL 5 MG PO TABS: 5.0000 mg | ORAL_TABLET | Freq: Four times a day (QID) | ORAL | Status: DC | PRN

## 2012-07-20 MED ORDER — OXYCODONE-ACETAMINOPHEN 5-325 MG PO TABS
2.0000 | ORAL_TABLET | Freq: Once | ORAL | Status: AC
  Administered 2012-07-20: 2 via ORAL
  Filled 2012-07-20: qty 2

## 2012-07-20 MED ORDER — KETOROLAC TROMETHAMINE 30 MG/ML IJ SOLN
30.0000 mg | Freq: Once | INTRAMUSCULAR | Status: AC
  Administered 2012-07-20: 30 mg via INTRAVENOUS
  Filled 2012-07-20: qty 1

## 2012-07-20 MED ORDER — ONDANSETRON 4 MG PO TBDP: 8.0000 mg | ORAL_TABLET | Freq: Once | ORAL | Status: AC

## 2012-07-20 NOTE — ED Notes (Signed)
Last BM 07/19/2012.

## 2012-07-20 NOTE — ED Provider Notes (Signed)
Received pt at sign out from Dr. Annamaria Helling. Patient presents for left flank pain with history of nephrolithiasis, recent CT scan, and being followed by Dr. Janee Morn (urology).  Patient is afebrile and hemodynamically stable at this time. Pt states pain has been well-managed and is ready to go home. Pt will be following up with Dr. Janee Morn. Patient given Roxicodone for pain control at home. Indications for ED return discussed. Patient states comfort and understanding with this discharge plan with no unaddressed concerns.  Glade Nurse, PA-C 07/20/12 [phone]

## 2012-07-20 NOTE — ED Provider Notes (Signed)
Medical screening examination/treatment/procedure(s) were conducted as a shared visit with non-physician practitioner(s) and myself.  I personally evaluated the patient during the encounter  Sunnie Nielsen, MD 07/20/12 2306

## 2012-07-20 NOTE — ED Provider Notes (Signed)
History     CSN: [id number]  Arrival date & time 07/20/12  0234   First MD Initiated Contact with Patient 07/20/12 [phone]      Chief Complaint  Patient presents with  . Abdominal Pain    (Consider location/radiation/quality/duration/timing/severity/associated sxs/prior treatment) HPI Hx per PT - L flank pain sharp sudden onset radiates LLQ h/o kidney stones and PT believes she has another, she has had multiple recent CT scans and declines another one today, she is followed by [facility] DR Patsi Sears, no F/C, no weakness/ dizzy, no vomiting, some nausea. Hematuria today. Onset today.   Past Medical History  Diagnosis Date  . Migraine   . Kidney stones   . Gallstones   . Acute pyelonephritis 06/29/2012    Past Surgical History  Procedure Laterality Date  . Abdominal hysterectomy    . Tonsillectomy    . Lithotripsy    . Cholecystectomy  03/11/2011    Procedure: LAPAROSCOPIC CHOLECYSTECTOMY WITH INTRAOPERATIVE CHOLANGIOGRAM;  Surgeon: Atilano Ina, MD;  Location: [facility] OR;  Service: General;  Laterality: N/A;  . Cholecystectomy, laparoscopic    . Orif ankle fracture  12/25/2011    Procedure: OPEN REDUCTION INTERNAL FIXATION (ORIF) ANKLE FRACTURE;  Surgeon: Javier Docker, MD;  Location: [facility] ORS;  Service: Orthopedics;  Laterality: Left;  . Cast application  12/25/2011    Procedure: CAST APPLICATION;  Surgeon: Javier Docker, MD;  Location: [facility] OR[facility];  Service: Orthopedics;  Laterality: Left;    Family History  Problem Relation Age of Onset  . Diabetes Mother   . Diabetes Father   . Heart failure Father   . Hypertension Father   . Pulmonary fibrosis Father     History  Substance Use Topics  . Smoking status: Current Every Day Smoker -- 0.50 packs/day    Types: Cigarettes  . Smokeless tobacco: Not on file  . Alcohol Use: No     Comment: occaisional    OB History   Grav Para Term Preterm Abortions TAB SAB Ect Mult Living                  Review of Systems  Constitutional:  Negative for fever and chills.  HENT: Negative for neck pain and neck stiffness.   Eyes: Negative for pain.  Respiratory: Negative for shortness of breath.   Cardiovascular: Negative for chest pain.  Gastrointestinal: Negative for abdominal pain.  Genitourinary: Positive for hematuria and flank pain. Negative for dysuria.  Musculoskeletal: Negative for back pain.  Skin: Negative for rash.  Neurological: Negative for headaches.  All other systems reviewed and are negative.    Allergies  Compazine and Metoclopramide hcl  Home Medications   Current Outpatient Rx  Name  Route  Sig  Dispense  Refill  . cholecalciferol (VITAMIN D) 1000 UNITS tablet   Oral   Take 2,000 Units by mouth daily.          Marland Kitchen escitalopram (LEXAPRO) 20 MG tablet   Oral   Take 20 mg by mouth at bedtime.          Marland Kitchen ibuprofen (ADVIL,MOTRIN) 200 MG tablet   Oral   Take 800 mg by mouth every 6 (six) hours as needed for pain.         Marland Kitchen omeprazole (PRILOSEC) 20 MG capsule   Oral   Take 20 mg by mouth at bedtime.          . promethazine (PHENERGAN) 12.5 MG tablet   Oral   Take 1  tablet (12.5 mg total) by mouth every 6 (six) hours as needed for nausea.   30 tablet   0   . oxyCODONE (ROXICODONE) 5 MG immediate release tablet   Oral   Take 1 tablet (5 mg total) by mouth every 6 (six) hours as needed for pain.   15 tablet   0     BP 113/79  Pulse 76  Temp(Src) 97.9 F (36.6 C) (Oral)  Resp 20  SpO2 95%  Physical Exam  Constitutional: She is oriented to person, place, and time. She appears well-developed and well-nourished.  HENT:  Head: Normocephalic and atraumatic.  Eyes: EOM are normal. Pupils are equal, round, and reactive to light.  Neck: Neck supple.  Cardiovascular: Normal rate, regular rhythm and intact distal pulses.   Pulmonary/Chest: Effort normal and breath sounds normal. No respiratory distress.  Abdominal: Soft. Bowel sounds are normal. She exhibits no distension. There is  no tenderness. There is no rebound and no guarding.  Localizes pain L flank, no CVAT, no point tenderness  Musculoskeletal: Normal range of motion. She exhibits no edema.  Neurological: She is alert and oriented to person, place, and time.  Skin: Skin is warm and dry.    ED Course  Procedures (including critical care time)  Labs Reviewed  CBC WITH DIFFERENTIAL - Abnormal; Notable for the following:    WBC 11.2 (*)    All other components within normal limits  URINALYSIS, ROUTINE W REFLEX MICROSCOPIC - Abnormal; Notable for the following:    APPearance CLOUDY (*)    Hgb urine dipstick LARGE (*)    Leukocytes, UA SMALL (*)    All other components within normal limits  URINE MICROSCOPIC-ADD ON - Abnormal; Notable for the following:    Squamous Epithelial / LPF FEW (*)    Bacteria, UA FEW (*)    All other components within normal limits  COMPREHENSIVE METABOLIC PANEL  PREGNANCY, URINE   IV Dilaudid, zofran, toradol, UA and labs  1. Kidney stones       MDM  L flank pain clinical kidney stone h/o same  Labs, UA no UTI  Improved IV narcotics, plan close urology follow up with strict return precautions verbalized as understood.         Sunnie Nielsen, MD 07/20/12 [phone]

## 2012-07-20 NOTE — ED Notes (Signed)
Patient ambulating to restroom at this time,

## 2012-07-20 NOTE — ED Notes (Signed)
C/o intermittent Left lower quadrant abdominal pain described as cramping. HX of left side kidney stones that she is seeing a urologist for.

## 2012-07-23 ENCOUNTER — Emergency Department: Payer: Self-pay | Admitting: Emergency Medicine

## 2012-07-23 LAB — COMPREHENSIVE METABOLIC PANEL
Albumin: 3.8 g/dL (ref 3.4–5.0)
Alkaline Phosphatase: 99 U/L (ref 50–136)
Anion Gap: 5 — ABNORMAL LOW (ref 7–16)
BUN: 8 mg/dL (ref 7–18)
Bilirubin,Total: 0.3 mg/dL (ref 0.2–1.0)
Calcium, Total: 9.4 mg/dL (ref 8.5–10.1)
Chloride: 106 mmol/L (ref 98–107)
Co2: 27 mmol/L (ref 21–32)
Creatinine: 0.82 mg/dL (ref 0.60–1.30)
EGFR (African American): >60
EGFR (Non-African Amer.): >60
Glucose: 93 mg/dL (ref 65–99)
Osmolality: 274 (ref 275–301)
Potassium: 3.7 mmol/L (ref 3.5–5.1)
SGOT(AST): 20 U/L (ref 15–37)
SGPT (ALT): 20 U/L (ref 12–78)
Sodium: 138 mmol/L (ref 136–145)
Total Protein: 7.5 g/dL (ref 6.4–8.2)

## 2012-07-23 LAB — URINALYSIS, COMPLETE
Bilirubin,UR: NEGATIVE
Glucose,UR: NEGATIVE mg/dL (ref 0–75)
Ketone: NEGATIVE
Nitrite: NEGATIVE
Ph: 5 (ref 4.5–8.0)
Protein: NEGATIVE
RBC,UR: 159 /HPF (ref 0–5)
Specific Gravity: 1.012 (ref 1.003–1.030)
Squamous Epithelial: 2
WBC UR: 2 /HPF (ref 0–5)

## 2012-07-23 LAB — CBC
HCT: 40.1 % (ref 35.0–47.0)
HGB: 13.7 g/dL (ref 12.0–16.0)
MCH: 30.8 pg (ref 26.0–34.0)
MCHC: 34.1 g/dL (ref 32.0–36.0)
MCV: 90 fL (ref 80–100)
Platelet: 269 10*3/uL (ref 150–440)
RBC: 4.44 10*6/uL (ref 3.80–5.20)
RDW: 13.5 % (ref 11.5–14.5)
WBC: 12.8 10*3/uL — ABNORMAL HIGH (ref 3.6–11.0)

## 2012-07-27 ENCOUNTER — Emergency Department: Payer: Self-pay | Admitting: Emergency Medicine

## 2012-07-27 LAB — URINALYSIS, COMPLETE
Bilirubin,UR: NEGATIVE
Glucose,UR: NEGATIVE mg/dL (ref 0–75)
Ketone: NEGATIVE
Nitrite: NEGATIVE
Ph: 7 (ref 4.5–8.0)
Protein: NEGATIVE
RBC,UR: 2623 /HPF (ref 0–5)
Specific Gravity: 1.015 (ref 1.003–1.030)
Squamous Epithelial: NONE SEEN
WBC UR: 7 /HPF (ref 0–5)

## 2012-07-27 LAB — COMPREHENSIVE METABOLIC PANEL
Albumin: 3.7 g/dL (ref 3.4–5.0)
Alkaline Phosphatase: 95 U/L (ref 50–136)
Anion Gap: 5 — ABNORMAL LOW (ref 7–16)
BUN: 14 mg/dL (ref 7–18)
Bilirubin,Total: 0.2 mg/dL (ref 0.2–1.0)
Calcium, Total: 8.9 mg/dL (ref 8.5–10.1)
Chloride: 108 mmol/L — ABNORMAL HIGH (ref 98–107)
Co2: 27 mmol/L (ref 21–32)
Creatinine: 0.84 mg/dL (ref 0.60–1.30)
EGFR (African American): >60
EGFR (Non-African Amer.): >60
Glucose: 88 mg/dL (ref 65–99)
Osmolality: 279 (ref 275–301)
Potassium: 3.7 mmol/L (ref 3.5–5.1)
SGOT(AST): 21 U/L (ref 15–37)
SGPT (ALT): 20 U/L (ref 12–78)
Sodium: 140 mmol/L (ref 136–145)
Total Protein: 7.4 g/dL (ref 6.4–8.2)

## 2012-07-27 LAB — CBC
HCT: 39.3 % (ref 35.0–47.0)
HGB: 13.4 g/dL (ref 12.0–16.0)
MCH: 30.8 pg (ref 26.0–34.0)
MCHC: 34.2 g/dL (ref 32.0–36.0)
MCV: 90 fL (ref 80–100)
Platelet: 256 10*3/uL (ref 150–440)
RBC: 4.36 10*6/uL (ref 3.80–5.20)
RDW: 13.4 % (ref 11.5–14.5)
WBC: 11.3 10*3/uL — ABNORMAL HIGH (ref 3.6–11.0)

## 2012-07-27 LAB — LIPASE, BLOOD: Lipase: 129 U/L (ref 73–393)

## 2012-07-29 ENCOUNTER — Emergency Department: Payer: Self-pay | Admitting: Emergency Medicine

## 2012-07-29 LAB — COMPREHENSIVE METABOLIC PANEL
Albumin: 3.8 g/dL (ref 3.4–5.0)
Alkaline Phosphatase: 91 U/L (ref 50–136)
Anion Gap: 5 — ABNORMAL LOW (ref 7–16)
BUN: 8 mg/dL (ref 7–18)
Bilirubin,Total: 0.2 mg/dL (ref 0.2–1.0)
Calcium, Total: 9.7 mg/dL (ref 8.5–10.1)
Chloride: 109 mmol/L — ABNORMAL HIGH (ref 98–107)
Co2: 25 mmol/L (ref 21–32)
Creatinine: 0.77 mg/dL (ref 0.60–1.30)
EGFR (African American): >60
EGFR (Non-African Amer.): >60
Glucose: 95 mg/dL (ref 65–99)
Osmolality: 276 (ref 275–301)
Potassium: 3.7 mmol/L (ref 3.5–5.1)
SGOT(AST): 20 U/L (ref 15–37)
SGPT (ALT): 19 U/L (ref 12–78)
Sodium: 139 mmol/L (ref 136–145)
Total Protein: 7.7 g/dL (ref 6.4–8.2)

## 2012-07-29 LAB — URINALYSIS, COMPLETE
Bacteria: NONE SEEN
Bilirubin,UR: NEGATIVE
Blood: NEGATIVE
Glucose,UR: NEGATIVE mg/dL (ref 0–75)
Ketone: NEGATIVE
Nitrite: NEGATIVE
Ph: 8 (ref 4.5–8.0)
Protein: NEGATIVE
RBC,UR: 5 /HPF (ref 0–5)
Specific Gravity: 1.017 (ref 1.003–1.030)
Squamous Epithelial: 3
WBC UR: 2 /HPF (ref 0–5)

## 2012-07-29 LAB — LIPASE, BLOOD: Lipase: 124 U/L (ref 73–393)

## 2012-07-29 LAB — CBC
HCT: 40.3 % (ref 35.0–47.0)
HGB: 13.6 g/dL (ref 12.0–16.0)
MCH: 30.4 pg (ref 26.0–34.0)
MCHC: 33.7 g/dL (ref 32.0–36.0)
MCV: 90 fL (ref 80–100)
Platelet: 258 10*3/uL (ref 150–440)
RBC: 4.46 10*6/uL (ref 3.80–5.20)
RDW: 13.8 % (ref 11.5–14.5)
WBC: 11.2 10*3/uL — ABNORMAL HIGH (ref 3.6–11.0)

## 2012-08-10 ENCOUNTER — Emergency Department: Payer: Self-pay | Admitting: Emergency Medicine

## 2012-08-10 LAB — COMPREHENSIVE METABOLIC PANEL
Albumin: 3.9 g/dL (ref 3.4–5.0)
Alkaline Phosphatase: 96 U/L (ref 50–136)
Anion Gap: 7 (ref 7–16)
BUN: 10 mg/dL (ref 7–18)
Bilirubin,Total: 0.2 mg/dL (ref 0.2–1.0)
Calcium, Total: 8.9 mg/dL (ref 8.5–10.1)
Chloride: 109 mmol/L — ABNORMAL HIGH (ref 98–107)
Co2: 25 mmol/L (ref 21–32)
Creatinine: 0.71 mg/dL (ref 0.60–1.30)
EGFR (African American): >60
EGFR (Non-African Amer.): >60
Glucose: 90 mg/dL (ref 65–99)
Osmolality: 280 (ref 275–301)
Potassium: 3.8 mmol/L (ref 3.5–5.1)
SGOT(AST): 21 U/L (ref 15–37)
SGPT (ALT): 18 U/L (ref 12–78)
Sodium: 141 mmol/L (ref 136–145)
Total Protein: 7.4 g/dL (ref 6.4–8.2)

## 2012-08-10 LAB — CBC
HCT: 38.8 % (ref 35.0–47.0)
HGB: 13.1 g/dL (ref 12.0–16.0)
MCH: 30.4 pg (ref 26.0–34.0)
MCHC: 33.7 g/dL (ref 32.0–36.0)
MCV: 90 fL (ref 80–100)
Platelet: 251 10*3/uL (ref 150–440)
RBC: 4.3 10*6/uL (ref 3.80–5.20)
RDW: 13.8 % (ref 11.5–14.5)
WBC: 8.9 10*3/uL (ref 3.6–11.0)

## 2012-08-10 LAB — URINALYSIS, COMPLETE
Bilirubin,UR: NEGATIVE
Glucose,UR: NEGATIVE mg/dL (ref 0–75)
Ketone: NEGATIVE
Leukocyte Esterase: NEGATIVE
Nitrite: NEGATIVE
Ph: 7 (ref 4.5–8.0)
Protein: 30
RBC,UR: 46 /HPF (ref 0–5)
Specific Gravity: 1.003 (ref 1.003–1.030)
Squamous Epithelial: 1
WBC UR: 3 /HPF (ref 0–5)

## 2012-08-10 LAB — LIPASE, BLOOD: Lipase: 135 U/L (ref 73–393)

## 2012-08-11 ENCOUNTER — Ambulatory Visit: Payer: Self-pay | Admitting: Obstetrics & Gynecology

## 2012-08-11 LAB — CBC
HCT: 39.2 % (ref 35.0–47.0)
HGB: 13.5 g/dL (ref 12.0–16.0)
MCH: 31.2 pg (ref 26.0–34.0)
MCHC: 34.5 g/dL (ref 32.0–36.0)
MCV: 90 fL (ref 80–100)
Platelet: 252 10*3/uL (ref 150–440)
RBC: 4.33 10*6/uL (ref 3.80–5.20)
RDW: 13.6 % (ref 11.5–14.5)
WBC: 8.6 10*3/uL (ref 3.6–11.0)

## 2012-08-13 ENCOUNTER — Ambulatory Visit: Payer: Self-pay | Admitting: Obstetrics & Gynecology

## 2012-08-14 LAB — PATHOLOGY REPORT

## 2012-08-20 ENCOUNTER — Emergency Department: Payer: Self-pay | Admitting: Unknown Physician Specialty

## 2012-08-20 LAB — URINALYSIS, COMPLETE
Bacteria: NONE SEEN
Bilirubin,UR: NEGATIVE
Glucose,UR: NEGATIVE mg/dL (ref 0–75)
Ketone: NEGATIVE
Leukocyte Esterase: NEGATIVE
Nitrite: NEGATIVE
Ph: 5 (ref 4.5–8.0)
Protein: 30
RBC,UR: 1819 /HPF (ref 0–5)
Specific Gravity: 1.026 (ref 1.003–1.030)
Squamous Epithelial: 1
WBC UR: 4 /HPF (ref 0–5)

## 2012-08-20 LAB — COMPREHENSIVE METABOLIC PANEL
Albumin: 3.7 g/dL (ref 3.4–5.0)
Alkaline Phosphatase: 93 U/L (ref 50–136)
Anion Gap: 6 — ABNORMAL LOW (ref 7–16)
BUN: 12 mg/dL (ref 7–18)
Bilirubin,Total: 0.4 mg/dL (ref 0.2–1.0)
Calcium, Total: 9.5 mg/dL (ref 8.5–10.1)
Chloride: 106 mmol/L (ref 98–107)
Co2: 26 mmol/L (ref 21–32)
Creatinine: 0.82 mg/dL (ref 0.60–1.30)
EGFR (African American): >60
EGFR (Non-African Amer.): >60
Glucose: 87 mg/dL (ref 65–99)
Osmolality: 275 (ref 275–301)
Potassium: 3.7 mmol/L (ref 3.5–5.1)
SGOT(AST): 41 U/L — ABNORMAL HIGH (ref 15–37)
SGPT (ALT): 50 U/L (ref 12–78)
Sodium: 138 mmol/L (ref 136–145)
Total Protein: 7.7 g/dL (ref 6.4–8.2)

## 2012-08-20 LAB — CBC WITH DIFFERENTIAL/PLATELET
Basophil #: 0.1 10*3/uL (ref 0.0–0.1)
Basophil %: 1 %
Eosinophil #: 0.3 10*3/uL (ref 0.0–0.7)
Eosinophil %: 2 %
HCT: 41.7 % (ref 35.0–47.0)
HGB: 14.2 g/dL (ref 12.0–16.0)
Lymphocyte #: 4.1 10*3/uL — ABNORMAL HIGH (ref 1.0–3.6)
Lymphocyte %: 29.7 %
MCH: 30.9 pg (ref 26.0–34.0)
MCHC: 34.2 g/dL (ref 32.0–36.0)
MCV: 91 fL (ref 80–100)
Monocyte #: 0.8 x10 3/mm (ref 0.2–0.9)
Monocyte %: 5.4 %
Neutrophil #: 8.6 10*3/uL — ABNORMAL HIGH (ref 1.4–6.5)
Neutrophil %: 61.9 %
Platelet: 240 10*3/uL (ref 150–440)
RBC: 4.6 10*6/uL (ref 3.80–5.20)
RDW: 13.7 % (ref 11.5–14.5)
WBC: 13.9 10*3/uL — ABNORMAL HIGH (ref 3.6–11.0)

## 2012-12-20 ENCOUNTER — Emergency Department (HOSPITAL_COMMUNITY)
Admission: EM | Admit: 2012-12-20 | Discharge: 2012-12-20 | Disposition: A | Discharge status: Home / Self Care | Payer: BC Managed Care – PPO | Attending: Emergency Medicine | Admitting: Emergency Medicine

## 2012-12-20 ENCOUNTER — Encounter (HOSPITAL_COMMUNITY): Payer: Self-pay | Admitting: Emergency Medicine

## 2012-12-20 DIAGNOSIS — Z87442 Personal history of urinary calculi: Secondary | ICD-10-CM | POA: Insufficient documentation

## 2012-12-20 DIAGNOSIS — Z8719 Personal history of other diseases of the digestive system: Secondary | ICD-10-CM | POA: Insufficient documentation

## 2012-12-20 DIAGNOSIS — G43909 Migraine, unspecified, not intractable, without status migrainosus: Secondary | ICD-10-CM | POA: Insufficient documentation

## 2012-12-20 DIAGNOSIS — F17200 Nicotine dependence, unspecified, uncomplicated: Secondary | ICD-10-CM | POA: Insufficient documentation

## 2012-12-20 DIAGNOSIS — Z79899 Other long term (current) drug therapy: Secondary | ICD-10-CM | POA: Insufficient documentation

## 2012-12-20 MED ORDER — DIPHENHYDRAMINE HCL 50 MG/ML IJ SOLN
25.0000 mg | Freq: Once | INTRAMUSCULAR | Status: AC
  Administered 2012-12-20: 25 mg via INTRAVENOUS
  Filled 2012-12-20: qty 1

## 2012-12-20 MED ORDER — DEXAMETHASONE SODIUM PHOSPHATE 10 MG/ML IJ SOLN
10.0000 mg | Freq: Once | INTRAMUSCULAR | Status: AC
  Administered 2012-12-20: 10 mg via INTRAVENOUS
  Filled 2012-12-20: qty 1

## 2012-12-20 MED ORDER — LORAZEPAM 2 MG/ML IJ SOLN
1.0000 mg | Freq: Once | INTRAMUSCULAR | Status: AC
  Administered 2012-12-20: 1 mg via INTRAVENOUS
  Filled 2012-12-20: qty 1

## 2012-12-20 MED ORDER — SODIUM CHLORIDE 0.9 % IV BOLUS (SEPSIS)
1000.0000 mL | Freq: Once | INTRAVENOUS | Status: AC
  Administered 2012-12-20: 1000 mL via INTRAVENOUS

## 2012-12-20 MED ORDER — ELETRIPTAN HYDROBROMIDE 40 MG PO TABS
40.0000 mg | ORAL_TABLET | Freq: Once | ORAL | Status: AC
  Administered 2012-12-20: 40 mg via ORAL
  Filled 2012-12-20: qty 1

## 2012-12-20 NOTE — ED Provider Notes (Signed)
6:27 AM Handoff from [place] NP. Patient with migraine, h/o same controlled with botox protocol. Requests ativan, benadryl for treatment as this has worked in past.   She has received these medications and states much improved. She requests Relpax and feels like this will help keep her HA from coming back. This is ordered, BP is normal.   Patient wants to go home. Will discharge.   Exam:  Gen NAD; Heart RRR, nml S1,S2, no m/r/g; Lungs CTAB; Abd soft, NT, no rebound or guarding; Neuro alert, oriented x 3, CN II-XII grossly intact; Ext 2+ pedal pulses bilaterally, no edema.  Patient urged to return with worsening symptoms or other concerns. Patient verbalized understanding and agrees with plan.    MDM: Pt with HA that is same as previous migraines. No fever or neck pain to suggest meningitis. No thunderclap or head trauma. Neuro exam is grossly normal. Feel patient appropriate for d/c. Doubt any life-threatening or emergent medical conditions.     Renne Crigler, PA-C 12/20/12 [phone]

## 2012-12-20 NOTE — ED Notes (Signed)
Pt started with Migraine and fever on Monday  Seen her PCP and she gave her relpak and that helped until Thursday ,  Pt is now nausea and headache

## 2012-12-20 NOTE — ED Provider Notes (Signed)
CSN: [id number]     Arrival date & time 12/20/12  0425 History   First MD Initiated Contact with Patient 12/20/12 [phone]     Chief Complaint  Patient presents with  . Migraine  . Nausea   (Consider location/radiation/quality/duration/timing/severity/associated sxs/prior Treatment) HPI Comments: Patient with a history of migraines, states she was in a Botox protocol, which significantly helped her headache, but for the past 7 days.  Has had a migraine.  She was seen by her primary care physician on Monday.  Given a Relpax.  Packet, which she's been using an with relief of her headache until Thursday.  Yesterday.  She took an ibuprofen, but by history.  Always get rebound.  She woke at 3 AM with worse headache, photophobia, nausea, no vomiting. She does not want narcotic nonsteroidal injection, she cannot take Compazine or Reglan due to the dystonic reaction  Patient is a 32 y.o. female presenting with migraines. The history is provided by the patient.  Migraine This is a recurrent problem. The current episode started in the past 7 days. The problem occurs constantly. The problem has been unchanged. Associated symptoms include headaches and nausea. Pertinent negatives include no fever, neck pain or vomiting. Exacerbated by: light. Treatments tried: relpax. The treatment provided no relief.    Past Medical History  Diagnosis Date  . Migraine   . Kidney stones   . Gallstones   . Acute pyelonephritis 06/29/2012   Past Surgical History  Procedure Laterality Date  . Abdominal hysterectomy    . Tonsillectomy    . Lithotripsy    . Cholecystectomy  03/11/2011    Procedure: LAPAROSCOPIC CHOLECYSTECTOMY WITH INTRAOPERATIVE CHOLANGIOGRAM;  Surgeon: Atilano Ina, MD;  Location: [facility] OR;  Service: General;  Laterality: N/A;  . Cholecystectomy, laparoscopic    . Orif ankle fracture  12/25/2011    Procedure: OPEN REDUCTION INTERNAL FIXATION (ORIF) ANKLE FRACTURE;  Surgeon: Javier Docker, MD;  Location:  [facility] OR[facility];  Service: Orthopedics;  Laterality: Left;  . Cast application  12/25/2011    Procedure: CAST APPLICATION;  Surgeon: Javier Docker, MD;  Location: [facility] ORS;  Service: Orthopedics;  Laterality: Left;   Family History  Problem Relation Age of Onset  . Diabetes Mother   . Diabetes Father   . Heart failure Father   . Hypertension Father   . Pulmonary fibrosis Father    History  Substance Use Topics  . Smoking status: Current Every Day Smoker -- 0.50 packs/day    Types: Cigarettes  . Smokeless tobacco: Not on file  . Alcohol Use: No     Comment: occaisional   OB History   Grav Para Term Preterm Abortions TAB SAB Ect Mult Living                 Review of Systems  Constitutional: Negative for fever.  Eyes: Positive for photophobia.  Gastrointestinal: Positive for nausea. Negative for vomiting.  Musculoskeletal: Negative for neck pain.  Neurological: Positive for headaches. Negative for dizziness.  All other systems reviewed and are negative.    Allergies  Compazine and Metoclopramide hcl  Home Medications   Current Outpatient Rx  Name  Route  Sig  Dispense  Refill  . eletriptan (RELPAX) 40 MG tablet   Oral   Take 40 mg by mouth as needed for migraine. One tablet by mouth at onset of headache. May repeat in 2 hours if headache persists or recurs.         Marland Kitchen ibuprofen (ADVIL,MOTRIN)  200 MG tablet   Oral   Take 800 mg by mouth every 8 (eight) hours as needed for pain or headache.          Marland Kitchen omeprazole (PRILOSEC) 20 MG capsule   Oral   Take 20 mg by mouth at bedtime.          Marland Kitchen omeprazole (PRILOSEC) 20 MG capsule   Oral   Take 20 mg by mouth daily.          BP 99/49  Pulse 70  Temp(Src) 98.2 F (36.8 C) (Oral)  Resp 16  SpO2 99% Physical Exam  Nursing note and vitals reviewed. Constitutional: She is oriented to person, place, and time. She appears well-developed and well-nourished.  HENT:  Head: Normocephalic.  Eyes: Pupils are equal, round,  and reactive to light.  Neck: Normal range of motion.  Cardiovascular: Normal rate.   Pulmonary/Chest: Effort normal.  Neurological: She is alert and oriented to person, place, and time.  Skin: Skin is warm. No rash noted.    ED Course  Procedures (including critical care time) Labs Review Labs Reviewed - No data to display Imaging Review No results found.  EKG Interpretation   None       MDM   1. Migraine headache    After discussion with patient decided a treatment plan is IV fluids, Benadryl, Ativan, and, Decadron     Arman Filter, NP 12/20/12 1950

## 2012-12-21 NOTE — ED Provider Notes (Signed)
Medical screening examination/treatment/procedure(s) were performed by non-physician practitioner and as supervising physician I was immediately available for consultation/collaboration.   Breeze Angell, MD 12/21/12 1113 

## 2012-12-21 NOTE — ED Provider Notes (Signed)
Medical screening examination/treatment/procedure(s) were performed by non-physician practitioner and as supervising physician I was immediately available for consultation/collaboration.   Sunnie Nielsen, MD 12/21/12 1113

## 2012-12-31 ENCOUNTER — Other Ambulatory Visit: Payer: Self-pay

## 2013-01-11 ENCOUNTER — Encounter (HOSPITAL_COMMUNITY): Payer: Self-pay | Admitting: Emergency Medicine

## 2013-01-11 ENCOUNTER — Emergency Department (HOSPITAL_COMMUNITY)
Admission: EM | Admit: 2013-01-11 | Discharge: 2013-01-11 | Disposition: A | Discharge status: Home / Self Care | Payer: Self-pay | Attending: Emergency Medicine | Admitting: Emergency Medicine

## 2013-01-11 ENCOUNTER — Emergency Department (HOSPITAL_COMMUNITY): Payer: Self-pay

## 2013-01-11 DIAGNOSIS — Z8679 Personal history of other diseases of the circulatory system: Secondary | ICD-10-CM | POA: Insufficient documentation

## 2013-01-11 DIAGNOSIS — Z8719 Personal history of other diseases of the digestive system: Secondary | ICD-10-CM | POA: Insufficient documentation

## 2013-01-11 DIAGNOSIS — Z3202 Encounter for pregnancy test, result negative: Secondary | ICD-10-CM | POA: Insufficient documentation

## 2013-01-11 DIAGNOSIS — R109 Unspecified abdominal pain: Secondary | ICD-10-CM

## 2013-01-11 DIAGNOSIS — F17200 Nicotine dependence, unspecified, uncomplicated: Secondary | ICD-10-CM | POA: Insufficient documentation

## 2013-01-11 DIAGNOSIS — R1013 Epigastric pain: Secondary | ICD-10-CM | POA: Insufficient documentation

## 2013-01-11 DIAGNOSIS — Z8742 Personal history of other diseases of the female genital tract: Secondary | ICD-10-CM | POA: Insufficient documentation

## 2013-01-11 DIAGNOSIS — Z79899 Other long term (current) drug therapy: Secondary | ICD-10-CM | POA: Insufficient documentation

## 2013-01-11 DIAGNOSIS — R1011 Right upper quadrant pain: Secondary | ICD-10-CM | POA: Insufficient documentation

## 2013-01-11 DIAGNOSIS — Z87442 Personal history of urinary calculi: Secondary | ICD-10-CM | POA: Insufficient documentation

## 2013-01-11 LAB — COMPREHENSIVE METABOLIC PANEL
ALT: 9 U/L (ref 0–35)
AST: 13 U/L (ref 0–37)
Albumin: 4 g/dL (ref 3.5–5.2)
Alkaline Phosphatase: 95 U/L (ref 39–117)
BUN: 12 mg/dL (ref 6–23)
CO2: 23 mEq/L (ref 19–32)
Calcium: 9.6 mg/dL (ref 8.4–10.5)
Chloride: 104 mEq/L (ref 96–112)
Creatinine, Ser: 0.75 mg/dL (ref 0.50–1.10)
GFR calc Af Amer: >90 mL/min (ref >90)
GFR calc non Af Amer: >90 mL/min (ref >90)
Glucose, Bld: 92 mg/dL (ref 70–99)
Potassium: 4.2 mEq/L (ref 3.5–5.1)
Sodium: 137 mEq/L (ref 135–145)
Total Bilirubin: 0.3 mg/dL (ref 0.3–1.2)
Total Protein: 7.8 g/dL (ref 6.0–8.3)

## 2013-01-11 LAB — CBC WITH DIFFERENTIAL/PLATELET
Basophils Absolute: 0.1 10*3/uL (ref 0.0–0.1)
Basophils Relative: 1 % (ref 0–1)
Eosinophils Absolute: 0.2 10*3/uL (ref 0.0–0.7)
Eosinophils Relative: 2 % (ref 0–5)
HCT: 41.8 % (ref 36.0–46.0)
Hemoglobin: 14.4 g/dL (ref 12.0–15.0)
Lymphocytes Relative: 24 % (ref 12–46)
Lymphs Abs: 2.6 10*3/uL (ref 0.7–4.0)
MCH: 31.9 pg (ref 26.0–34.0)
MCHC: 34.4 g/dL (ref 30.0–36.0)
MCV: 92.5 fL (ref 78.0–100.0)
Monocytes Absolute: 0.5 10*3/uL (ref 0.1–1.0)
Monocytes Relative: 5 % (ref 3–12)
Neutro Abs: 7.5 10*3/uL (ref 1.7–7.7)
Neutrophils Relative %: 69 % (ref 43–77)
Platelets: 238 10*3/uL (ref 150–400)
RBC: 4.52 MIL/uL (ref 3.87–5.11)
RDW: 13.3 % (ref 11.5–15.5)
WBC: 10.8 10*3/uL — ABNORMAL HIGH (ref 4.0–10.5)

## 2013-01-11 LAB — URINALYSIS, ROUTINE W REFLEX MICROSCOPIC
Bilirubin Urine: NEGATIVE
Glucose, UA: NEGATIVE mg/dL
Ketones, ur: NEGATIVE mg/dL
Leukocytes, UA: NEGATIVE
Nitrite: NEGATIVE
Protein, ur: NEGATIVE mg/dL
Specific Gravity, Urine: 1.013 (ref 1.005–1.030)
Urobilinogen, UA: 0.2 mg/dL (ref 0.0–1.0)
pH: 5.5 (ref 5.0–8.0)

## 2013-01-11 LAB — LIPASE, BLOOD: Lipase: 27 U/L (ref 11–59)

## 2013-01-11 LAB — URINE MICROSCOPIC-ADD ON

## 2013-01-11 LAB — POCT PREGNANCY, URINE: Preg Test, Ur: NEGATIVE

## 2013-01-11 MED ORDER — SODIUM CHLORIDE 0.9 % IV BOLUS (SEPSIS)
1000.0000 mL | Freq: Once | INTRAVENOUS | Status: AC
  Administered 2013-01-11: 1000 mL via INTRAVENOUS

## 2013-01-11 MED ORDER — IOHEXOL 300 MG/ML  SOLN
100.0000 mL | Freq: Once | INTRAMUSCULAR | Status: AC | PRN
  Administered 2013-01-11: 100 mL via INTRAVENOUS

## 2013-01-11 MED ORDER — ONDANSETRON HCL 4 MG/2ML IJ SOLN
4.0000 mg | Freq: Once | INTRAMUSCULAR | Status: AC
  Administered 2013-01-11: 4 mg via INTRAVENOUS
  Filled 2013-01-11: qty 2

## 2013-01-11 MED ORDER — HYDROMORPHONE HCL PF 1 MG/ML IJ SOLN
1.0000 mg | Freq: Once | INTRAMUSCULAR | Status: AC
  Administered 2013-01-11: 1 mg via INTRAVENOUS
  Filled 2013-01-11: qty 1

## 2013-01-11 MED ORDER — HYDROMORPHONE HCL PF 1 MG/ML IJ SOLN
0.5000 mg | Freq: Once | INTRAMUSCULAR | Status: AC
  Administered 2013-01-11: 0.5 mg via INTRAVENOUS
  Filled 2013-01-11: qty 1

## 2013-01-11 MED ORDER — PANTOPRAZOLE SODIUM 40 MG IV SOLR
40.0000 mg | Freq: Once | INTRAVENOUS | Status: AC
  Administered 2013-01-11: 40 mg via INTRAVENOUS
  Filled 2013-01-11: qty 40

## 2013-01-11 MED ORDER — IOHEXOL 300 MG/ML  SOLN
25.0000 mL | INTRAMUSCULAR | Status: AC
  Administered 2013-01-11 (×2): 25 mL via ORAL

## 2013-01-11 MED ORDER — LORAZEPAM 2 MG/ML IJ SOLN
0.5000 mg | Freq: Once | INTRAMUSCULAR | Status: DC
  Filled 2013-01-11: qty 1

## 2013-01-11 MED ORDER — HYDROCODONE-ACETAMINOPHEN 5-325 MG PO TABS: 1.0000 | ORAL_TABLET | Freq: Four times a day (QID) | ORAL | Status: DC | PRN

## 2013-01-11 MED ORDER — PROMETHAZINE HCL 25 MG PO TABS: 25.0000 mg | ORAL_TABLET | Freq: Four times a day (QID) | ORAL | Status: DC | PRN

## 2013-01-11 MED ORDER — LORAZEPAM 2 MG/ML IJ SOLN
0.5000 mg | Freq: Once | INTRAMUSCULAR | Status: AC
  Administered 2013-01-11: 0.5 mg via INTRAVENOUS
  Filled 2013-01-11 (×2): qty 1

## 2013-01-11 NOTE — ED Notes (Signed)
Pt c/o upper right sided abd pain starting last night with some intermittent episodes for several days with N/V

## 2013-01-11 NOTE — ED Notes (Signed)
Patient transported to CT 

## 2013-01-11 NOTE — ED Notes (Signed)
Patient is alert and orientedx4.  Patient was explained discharge instructions and they understood them with no questions.  The patient's husband, [name]  is coming to get her and transporting her home.

## 2013-01-11 NOTE — ED Notes (Signed)
Pt was given contrast to drink for CT

## 2013-01-11 NOTE — ED Provider Notes (Signed)
CSN: [id number]     Arrival date & time 01/11/13  8295 History   First MD Initiated Contact with Patient 01/11/13 [phone]     Chief Complaint  Patient presents with  . Abdominal Pain   (Consider location/radiation/quality/duration/timing/severity/associated sxs/prior Treatment) Patient is a 32 y.o. female presenting with abdominal pain. The history is provided by the patient (the pt complains of ruq abd pain.  she has had her gall bladder out). No language interpreter was used.  Abdominal Pain Pain location:  Epigastric Pain quality: dull   Pain radiates to:  Does not radiate Pain severity:  Moderate Onset quality:  Gradual Timing:  Constant Progression:  Unchanged Associated symptoms: no chest pain, no cough, no diarrhea, no fatigue and no hematuria     Past Medical History  Diagnosis Date  . Migraine   . Kidney stones   . Gallstones   . Acute pyelonephritis 06/29/2012   Past Surgical History  Procedure Laterality Date  . Abdominal hysterectomy    . Tonsillectomy    . Lithotripsy    . Cholecystectomy  03/11/2011    Procedure: LAPAROSCOPIC CHOLECYSTECTOMY WITH INTRAOPERATIVE CHOLANGIOGRAM;  Surgeon: Atilano Ina, MD;  Location: [facility] OR;  Service: General;  Laterality: N/A;  . Cholecystectomy, laparoscopic    . Orif ankle fracture  12/25/2011    Procedure: OPEN REDUCTION INTERNAL FIXATION (ORIF) ANKLE FRACTURE;  Surgeon: Javier Docker, MD;  Location: [facility] ORS;  Service: Orthopedics;  Laterality: Left;  . Cast application  12/25/2011    Procedure: CAST APPLICATION;  Surgeon: Javier Docker, MD;  Location: [facility] ORS;  Service: Orthopedics;  Laterality: Left;   Family History  Problem Relation Age of Onset  . Diabetes Mother   . Diabetes Father   . Heart failure Father   . Hypertension Father   . Pulmonary fibrosis Father    History  Substance Use Topics  . Smoking status: Current Every Day Smoker -- 0.50 packs/day    Types: Cigarettes  . Smokeless tobacco: Not on file  .  Alcohol Use: No     Comment: occaisional   OB History   Grav Para Term Preterm Abortions TAB SAB Ect Mult Living                 Review of Systems  Constitutional: Negative for appetite change and fatigue.  HENT: Negative for congestion, ear discharge and sinus pressure.   Eyes: Negative for discharge.  Respiratory: Negative for cough.   Cardiovascular: Negative for chest pain.  Gastrointestinal: Positive for abdominal pain. Negative for diarrhea.  Genitourinary: Negative for frequency and hematuria.  Musculoskeletal: Negative for back pain.  Skin: Negative for rash.  Neurological: Negative for seizures and headaches.  Psychiatric/Behavioral: Negative for hallucinations.    Allergies  Compazine and Metoclopramide hcl  Home Medications   Current Outpatient Rx  Name  Route  Sig  Dispense  Refill  . ibuprofen (ADVIL,MOTRIN) 200 MG tablet   Oral   Take 800 mg by mouth every 8 (eight) hours as needed for pain or headache.          Marland Kitchen omeprazole (PRILOSEC) 20 MG capsule   Oral   Take 20 mg by mouth daily.         . ondansetron (ZOFRAN-ODT) 8 MG disintegrating tablet   Oral   Take 8 mg by mouth every 8 (eight) hours as needed for nausea or vomiting.         Marland Kitchen HYDROcodone-acetaminophen (NORCO/VICODIN) 5-325 MG per tablet  Oral   Take 1 tablet by mouth every 6 (six) hours as needed for moderate pain.   30 tablet   0   . promethazine (PHENERGAN) 25 MG tablet   Oral   Take 1 tablet (25 mg total) by mouth every 6 (six) hours as needed for nausea or vomiting.   20 tablet   0    BP 115/74  Pulse 70  Temp(Src) 98.1 F (36.7 C) (Oral)  Resp 22  Ht 5\' 7"  (1.702 m)  Wt 219 lb 14.4 oz (99.746 kg)  BMI 34.43 kg/m2  SpO2 100% Physical Exam  Constitutional: She is oriented to person, place, and time. She appears well-developed.  HENT:  Head: Normocephalic.  Eyes: Conjunctivae and EOM are normal. No scleral icterus.  Neck: Neck supple. No thyromegaly present.   Cardiovascular: Normal rate and regular rhythm.  Exam reveals no gallop and no friction rub.   No murmur heard. Pulmonary/Chest: No stridor. She has no wheezes. She has no rales. She exhibits no tenderness.  Abdominal: She exhibits no distension. There is tenderness. There is no rebound.  Tender ruq  Musculoskeletal: Normal range of motion. She exhibits no edema.  Lymphadenopathy:    She has no cervical adenopathy.  Neurological: She is oriented to person, place, and time. She exhibits normal muscle tone. Coordination normal.  Skin: No rash noted. No erythema.  Psychiatric: She has a normal mood and affect. Her behavior is normal.    ED Course  Procedures (including critical care time) Labs Review Labs Reviewed  CBC WITH DIFFERENTIAL - Abnormal; Notable for the following:    WBC 10.8 (*)    All other components within normal limits  URINALYSIS, ROUTINE W REFLEX MICROSCOPIC - Abnormal; Notable for the following:    Hgb urine dipstick TRACE (*)    All other components within normal limits  COMPREHENSIVE METABOLIC PANEL  LIPASE, BLOOD  URINE MICROSCOPIC-ADD ON  POCT PREGNANCY, URINE   Imaging Review Ct Abdomen Pelvis W Contrast  01/11/2013   CLINICAL DATA:  Right upper quadrant pain with nausea and vomiting.  EXAM: CT ABDOMEN AND PELVIS WITH CONTRAST  TECHNIQUE: Multidetector CT imaging of the abdomen and pelvis was performed using the standard protocol following bolus administration of intravenous contrast.  CONTRAST:  OMNIPAQUE IOHEXOL 300 MG/ML  SOLN  COMPARISON:  07/16/2012.  FINDINGS: Lung bases show no acute findings. Heart size normal. No pericardial or pleural effusion.  Liver is unremarkable. Cholecystectomy. Adrenal glands are unremarkable. An 11 mm low-attenuation lesion in the right kidney is likely a cyst. Stones are seen in the left kidney. No obstruction. Spleen, pancreas, stomach and small bowel are unremarkable. Appendix is normal. Stool is seen in the majority of  the colon.  No pathologically enlarged lymph nodes. Hysterectomy. Ovaries are visualized. No free fluid. No worrisome lytic or sclerotic lesions.  IMPRESSION: 1. No acute findings to explain the patient's given symptoms, other than constipation. 2. Nonobstructing left renal stones.   Electronically Signed   By: Leanna Battles M.D.   On: 01/11/2013 13:22    EKG Interpretation   None       MDM   1. Abdominal pain        Benny Lennert, MD 01/11/13 1425

## 2013-01-13 ENCOUNTER — Encounter (HOSPITAL_COMMUNITY): Payer: Self-pay | Admitting: Emergency Medicine

## 2013-01-13 ENCOUNTER — Emergency Department (HOSPITAL_COMMUNITY)
Admission: EM | Admit: 2013-01-13 | Discharge: 2013-01-13 | Disposition: A | Discharge status: Home / Self Care | Payer: BC Managed Care – PPO | Attending: Emergency Medicine | Admitting: Emergency Medicine

## 2013-01-13 DIAGNOSIS — R109 Unspecified abdominal pain: Secondary | ICD-10-CM | POA: Insufficient documentation

## 2013-01-13 DIAGNOSIS — Z3202 Encounter for pregnancy test, result negative: Secondary | ICD-10-CM | POA: Insufficient documentation

## 2013-01-13 DIAGNOSIS — R319 Hematuria, unspecified: Secondary | ICD-10-CM | POA: Insufficient documentation

## 2013-01-13 DIAGNOSIS — R112 Nausea with vomiting, unspecified: Secondary | ICD-10-CM | POA: Insufficient documentation

## 2013-01-13 DIAGNOSIS — Z87442 Personal history of urinary calculi: Secondary | ICD-10-CM | POA: Insufficient documentation

## 2013-01-13 DIAGNOSIS — Z79899 Other long term (current) drug therapy: Secondary | ICD-10-CM | POA: Insufficient documentation

## 2013-01-13 DIAGNOSIS — F17200 Nicotine dependence, unspecified, uncomplicated: Secondary | ICD-10-CM | POA: Insufficient documentation

## 2013-01-13 DIAGNOSIS — R509 Fever, unspecified: Secondary | ICD-10-CM | POA: Insufficient documentation

## 2013-01-13 LAB — CBC WITH DIFFERENTIAL/PLATELET
Basophils Absolute: 0.1 10*3/uL (ref 0.0–0.1)
Basophils Relative: 1 % (ref 0–1)
Eosinophils Absolute: 0.2 10*3/uL (ref 0.0–0.7)
Eosinophils Relative: 2 % (ref 0–5)
HCT: 39.5 % (ref 36.0–46.0)
Hemoglobin: 13.3 g/dL (ref 12.0–15.0)
Lymphocytes Relative: 37 % (ref 12–46)
Lymphs Abs: 3.5 10*3/uL (ref 0.7–4.0)
MCH: 30.6 pg (ref 26.0–34.0)
MCHC: 33.7 g/dL (ref 30.0–36.0)
MCV: 90.8 fL (ref 78.0–100.0)
Monocytes Absolute: 0.5 10*3/uL (ref 0.1–1.0)
Monocytes Relative: 6 % (ref 3–12)
Neutro Abs: 5.2 10*3/uL (ref 1.7–7.7)
Neutrophils Relative %: 55 % (ref 43–77)
Platelets: 260 10*3/uL (ref 150–400)
RBC: 4.35 MIL/uL (ref 3.87–5.11)
RDW: 13.3 % (ref 11.5–15.5)
WBC: 9.4 10*3/uL (ref 4.0–10.5)

## 2013-01-13 LAB — COMPREHENSIVE METABOLIC PANEL
ALT: 9 U/L (ref 0–35)
AST: 12 U/L (ref 0–37)
Albumin: 4.1 g/dL (ref 3.5–5.2)
Alkaline Phosphatase: 89 U/L (ref 39–117)
BUN: 8 mg/dL (ref 6–23)
CO2: 23 mEq/L (ref 19–32)
Calcium: 9.6 mg/dL (ref 8.4–10.5)
Chloride: 104 mEq/L (ref 96–112)
Creatinine, Ser: 0.68 mg/dL (ref 0.50–1.10)
GFR calc Af Amer: >90 mL/min (ref >90)
GFR calc non Af Amer: >90 mL/min (ref >90)
Glucose, Bld: 85 mg/dL (ref 70–99)
Potassium: 3.8 mEq/L (ref 3.5–5.1)
Sodium: 137 mEq/L (ref 135–145)
Total Bilirubin: 0.2 mg/dL — ABNORMAL LOW (ref 0.3–1.2)
Total Protein: 7.4 g/dL (ref 6.0–8.3)

## 2013-01-13 LAB — URINALYSIS, ROUTINE W REFLEX MICROSCOPIC
Bilirubin Urine: NEGATIVE
Glucose, UA: NEGATIVE mg/dL
Ketones, ur: NEGATIVE mg/dL
Nitrite: NEGATIVE
Protein, ur: 30 mg/dL — AB
Specific Gravity, Urine: 1.021 (ref 1.005–1.030)
Urobilinogen, UA: 0.2 mg/dL (ref 0.0–1.0)
pH: 5.5 (ref 5.0–8.0)

## 2013-01-13 LAB — PREGNANCY, URINE: Preg Test, Ur: NEGATIVE

## 2013-01-13 LAB — URINE MICROSCOPIC-ADD ON

## 2013-01-13 MED ORDER — SODIUM CHLORIDE 0.9 % IV BOLUS (SEPSIS)
1000.0000 mL | Freq: Once | INTRAVENOUS | Status: AC
  Administered 2013-01-13: 1000 mL via INTRAVENOUS

## 2013-01-13 MED ORDER — ONDANSETRON HCL 4 MG/2ML IJ SOLN
4.0000 mg | Freq: Once | INTRAMUSCULAR | Status: AC
  Administered 2013-01-13: 4 mg via INTRAVENOUS
  Filled 2013-01-13: qty 2

## 2013-01-13 MED ORDER — MORPHINE SULFATE 4 MG/ML IJ SOLN
4.0000 mg | Freq: Once | INTRAMUSCULAR | Status: AC
  Administered 2013-01-13: 4 mg via INTRAVENOUS
  Filled 2013-01-13: qty 1

## 2013-01-13 MED ORDER — KETOROLAC TROMETHAMINE 30 MG/ML IJ SOLN
30.0000 mg | Freq: Once | INTRAMUSCULAR | Status: AC
  Administered 2013-01-13: 30 mg via INTRAVENOUS
  Filled 2013-01-13: qty 1

## 2013-01-13 NOTE — ED Notes (Signed)
Pt c/o right flank pain that radiates down right lower side that has been going on since approx Saturday. Pt was seen Monday and referred to GI but states her insurance doenst start until Jan 2015.  Pt states that today she went to bathroom and urine is tea color but denies any painful or trouble urinating.

## 2013-01-13 NOTE — ED Provider Notes (Signed)
CSN: [id number]     Arrival date & time 01/13/13  1706 History   First MD Initiated Contact with Patient 01/13/13 1748     Chief Complaint  Patient presents with  . Flank Pain    right   (Consider location/radiation/quality/duration/timing/severity/associated sxs/prior Treatment) HPI  This a 69 are old female who presents with right-sided upper abdominal and flank pain. Patient was seen on Monday for the same. At that time she had a CT scan noncontrasted abdomen that showed no evidence of kidney stones on the right the kidneys and the left. Patient does have a history of kidney stones.  Patient reports that she is taking Norco at home without any relief of her pain. Patient states that the pain starts sharp the right upper for and radiates to her back. She endorses nausea and vomiting. She denies any diarrhea. Patient reports low-grade fevers to 100.0. Today patient noted gross hematuria which scared her.  Past Medical History  Diagnosis Date  . Migraine   . Kidney stones   . Gallstones   . Acute pyelonephritis 06/29/2012   Past Surgical History  Procedure Laterality Date  . Abdominal hysterectomy    . Tonsillectomy    . Lithotripsy    . Cholecystectomy  03/11/2011    Procedure: LAPAROSCOPIC CHOLECYSTECTOMY WITH INTRAOPERATIVE CHOLANGIOGRAM;  Surgeon: Atilano Ina, MD;  Location: [facility] OR;  Service: General;  Laterality: N/A;  . Cholecystectomy, laparoscopic    . Orif ankle fracture  12/25/2011    Procedure: OPEN REDUCTION INTERNAL FIXATION (ORIF) ANKLE FRACTURE;  Surgeon: Javier Docker, MD;  Location: [facility] ORS;  Service: Orthopedics;  Laterality: Left;  . Cast application  12/25/2011    Procedure: CAST APPLICATION;  Surgeon: Javier Docker, MD;  Location: [facility] ORS;  Service: Orthopedics;  Laterality: Left;   Family History  Problem Relation Age of Onset  . Diabetes Mother   . Diabetes Father   . Heart failure Father   . Hypertension Father   . Pulmonary fibrosis Father    History   Substance Use Topics  . Smoking status: Current Every Day Smoker -- 0.50 packs/day    Types: Cigarettes  . Smokeless tobacco: Not on file  . Alcohol Use: No     Comment: occaisional   OB History   Grav Para Term Preterm Abortions TAB SAB Ect Mult Living                 Review of Systems  Constitutional: Negative for fever.  Respiratory: Negative for cough, chest tightness and shortness of breath.   Cardiovascular: Negative for chest pain.  Gastrointestinal: Positive for nausea, vomiting and abdominal pain. Negative for diarrhea and constipation.  Genitourinary: Positive for hematuria. Negative for dysuria.  Musculoskeletal: Negative for back pain.  Neurological: Negative for headaches.  All other systems reviewed and are negative.    Allergies  Compazine and Metoclopramide hcl  Home Medications   Current Outpatient Rx  Name  Route  Sig  Dispense  Refill  . HYDROcodone-acetaminophen (NORCO/VICODIN) 5-325 MG per tablet   Oral   Take 1 tablet by mouth every 6 (six) hours as needed for moderate pain.   30 tablet   0   . ibuprofen (ADVIL,MOTRIN) 200 MG tablet   Oral   Take 800 mg by mouth every 8 (eight) hours as needed for pain or headache.          Marland Kitchen omeprazole (PRILOSEC) 20 MG capsule   Oral   Take 20 mg by  mouth daily.         . ondansetron (ZOFRAN-ODT) 8 MG disintegrating tablet   Oral   Take 8 mg by mouth every 8 (eight) hours as needed for nausea or vomiting.         . promethazine (PHENERGAN) 25 MG tablet   Oral   Take 1 tablet (25 mg total) by mouth every 6 (six) hours as needed for nausea or vomiting.   20 tablet   0    There were no vitals taken for this visit. Physical Exam  Nursing note and vitals reviewed. Constitutional: She is oriented to person, place, and time.  Uncomfortable appearing and walking around during pacing, no acute distress  HENT:  Head: Normocephalic and atraumatic.  Mouth/Throat: Oropharynx is clear and moist.  Eyes:  Pupils are equal, round, and reactive to light.  Neck: Neck supple.  Cardiovascular: Normal rate, regular rhythm and normal heart sounds.   Pulmonary/Chest: Effort normal and breath sounds normal. No respiratory distress. She has no wheezes.  Abdominal: Soft. Bowel sounds are normal. There is no tenderness. There is no rebound and no guarding.  Musculoskeletal: She exhibits no edema.  Neurological: She is alert and oriented to person, place, and time.  Skin: Skin is warm and dry.  Psychiatric: She has a normal mood and affect.    ED Course  Procedures (including critical care time) Labs Review Labs Reviewed  URINALYSIS, ROUTINE W REFLEX MICROSCOPIC - Abnormal; Notable for the following:    Color, Urine RED (*)    APPearance CLOUDY (*)    Hgb urine dipstick LARGE (*)    Protein, ur 30 (*)    Leukocytes, UA SMALL (*)    All other components within normal limits  COMPREHENSIVE METABOLIC PANEL - Abnormal; Notable for the following:    Total Bilirubin 0.2 (*)    All other components within normal limits  PREGNANCY, URINE  CBC WITH DIFFERENTIAL  URINE MICROSCOPIC-ADD ON   Imaging Review No results found.  EKG Interpretation   None       Ct Abdomen Pelvis W Contrast  01/11/2013   CLINICAL DATA:  Right upper quadrant pain with nausea and vomiting.  EXAM: CT ABDOMEN AND PELVIS WITH CONTRAST  TECHNIQUE: Multidetector CT imaging of the abdomen and pelvis was performed using the standard protocol following bolus administration of intravenous contrast.  CONTRAST:  OMNIPAQUE IOHEXOL 300 MG/ML  SOLN  COMPARISON:  07/16/2012.  FINDINGS: Lung bases show no acute findings. Heart size normal. No pericardial or pleural effusion.  Liver is unremarkable. Cholecystectomy. Adrenal glands are unremarkable. An 11 mm low-attenuation lesion in the right kidney is likely a cyst. Stones are seen in the left kidney. No obstruction. Spleen, pancreas, stomach and small bowel are unremarkable. Appendix  is normal. Stool is seen in the majority of the colon.  No pathologically enlarged lymph nodes. Hysterectomy. Ovaries are visualized. No free fluid. No worrisome lytic or sclerotic lesions.  IMPRESSION: 1. No acute findings to explain the patient's given symptoms, other than constipation. 2. Nonobstructing left renal stones.   Electronically Signed   By: Leanna Battles M.D.   On: 01/11/2013 13:22     MDM   1. Flank pain     Patient presents with right-sided flank pain. She is seen today for the same. She is nontoxic-appearing. She does appear uncomfortable. Review of patient's CT scan showed kidney stones on the left but not on the right. She does not have a gallbladder. The patient was  given pain medicine and fluids. I discussed with the patient workup options. Given her hematuria and physical examination, suspect kidney stone. Will treat as such. Basic labwork is unremarkable.  Patient had some improvement of her symptoms. She is requesting discharge because her mother got in a car accident. Plan is still for her to followup with GI.  She remained hemodynamically stable and has a benign exam.  After history, exam, and medical workup I feel the patient has been appropriately medically screened and is safe for discharge home. Pertinent diagnoses were discussed with the patient. Patient was given return precautions.     Shon Baton, MD 01/14/13 [phone]

## 2013-02-25 DIAGNOSIS — N12 Tubulo-interstitial nephritis, not specified as acute or chronic: Secondary | ICD-10-CM

## 2013-02-25 HISTORY — DX: Tubulo-interstitial nephritis, not specified as acute or chronic: N12

## 2013-03-26 ENCOUNTER — Emergency Department: Payer: Self-pay | Admitting: Emergency Medicine

## 2013-03-26 LAB — CBC
HCT: 41.6 % (ref 35.0–47.0)
HGB: 14.2 g/dL (ref 12.0–16.0)
MCH: 31.3 pg (ref 26.0–34.0)
MCHC: 34.3 g/dL (ref 32.0–36.0)
MCV: 92 fL (ref 80–100)
Platelet: 227 10*3/uL (ref 150–440)
RBC: 4.55 10*6/uL (ref 3.80–5.20)
RDW: 13.1 % (ref 11.5–14.5)
WBC: 9.1 10*3/uL (ref 3.6–11.0)

## 2013-03-26 LAB — COMPREHENSIVE METABOLIC PANEL
Albumin: 4 g/dL (ref 3.4–5.0)
Alkaline Phosphatase: 97 U/L
Anion Gap: 6 — ABNORMAL LOW (ref 7–16)
BUN: 11 mg/dL (ref 7–18)
Bilirubin,Total: 0.4 mg/dL (ref 0.2–1.0)
Calcium, Total: 9.3 mg/dL (ref 8.5–10.1)
Chloride: 103 mmol/L (ref 98–107)
Co2: 27 mmol/L (ref 21–32)
Creatinine: 0.84 mg/dL (ref 0.60–1.30)
EGFR (African American): >60
EGFR (Non-African Amer.): >60
Glucose: 79 mg/dL (ref 65–99)
Osmolality: 270 (ref 275–301)
Potassium: 3.8 mmol/L (ref 3.5–5.1)
SGOT(AST): 20 U/L (ref 15–37)
SGPT (ALT): 16 U/L (ref 12–78)
Sodium: 136 mmol/L (ref 136–145)
Total Protein: 7.9 g/dL (ref 6.4–8.2)

## 2013-03-26 LAB — URINALYSIS, COMPLETE
Bacteria: NONE SEEN
Bilirubin,UR: NEGATIVE
Glucose,UR: NEGATIVE mg/dL (ref 0–75)
Leukocyte Esterase: NEGATIVE
Nitrite: NEGATIVE
Ph: 5 (ref 4.5–8.0)
Protein: 30
RBC,UR: 1980 /HPF (ref 0–5)
Specific Gravity: 1.018 (ref 1.003–1.030)
Squamous Epithelial: 1
WBC UR: 27 /HPF (ref 0–5)

## 2013-06-20 ENCOUNTER — Emergency Department (HOSPITAL_COMMUNITY)
Admission: EM | Admit: 2013-06-20 | Discharge: 2013-06-20 | Disposition: A | Discharge status: Home / Self Care | Payer: BC Managed Care – PPO | Attending: Emergency Medicine | Admitting: Emergency Medicine

## 2013-06-20 ENCOUNTER — Encounter (HOSPITAL_COMMUNITY): Payer: Self-pay | Admitting: Emergency Medicine

## 2013-06-20 DIAGNOSIS — G43909 Migraine, unspecified, not intractable, without status migrainosus: Secondary | ICD-10-CM | POA: Insufficient documentation

## 2013-06-20 DIAGNOSIS — Z3202 Encounter for pregnancy test, result negative: Secondary | ICD-10-CM | POA: Insufficient documentation

## 2013-06-20 DIAGNOSIS — Z79899 Other long term (current) drug therapy: Secondary | ICD-10-CM | POA: Insufficient documentation

## 2013-06-20 DIAGNOSIS — M549 Dorsalgia, unspecified: Secondary | ICD-10-CM

## 2013-06-20 DIAGNOSIS — M545 Low back pain, unspecified: Secondary | ICD-10-CM | POA: Insufficient documentation

## 2013-06-20 DIAGNOSIS — F17200 Nicotine dependence, unspecified, uncomplicated: Secondary | ICD-10-CM | POA: Insufficient documentation

## 2013-06-20 DIAGNOSIS — Z8719 Personal history of other diseases of the digestive system: Secondary | ICD-10-CM | POA: Insufficient documentation

## 2013-06-20 DIAGNOSIS — Z87442 Personal history of urinary calculi: Secondary | ICD-10-CM | POA: Insufficient documentation

## 2013-06-20 DIAGNOSIS — Z87448 Personal history of other diseases of urinary system: Secondary | ICD-10-CM | POA: Insufficient documentation

## 2013-06-20 DIAGNOSIS — Z791 Long term (current) use of non-steroidal anti-inflammatories (NSAID): Secondary | ICD-10-CM | POA: Insufficient documentation

## 2013-06-20 LAB — URINALYSIS, ROUTINE W REFLEX MICROSCOPIC
Glucose, UA: NEGATIVE mg/dL
Ketones, ur: NEGATIVE mg/dL
Leukocytes, UA: NEGATIVE
Nitrite: NEGATIVE
Protein, ur: NEGATIVE mg/dL
Specific Gravity, Urine: 1.012 (ref 1.005–1.030)
Urobilinogen, UA: 0.2 mg/dL (ref 0.0–1.0)
pH: 6 (ref 5.0–8.0)

## 2013-06-20 LAB — URINE MICROSCOPIC-ADD ON

## 2013-06-20 LAB — POC URINE PREG, ED: Preg Test, Ur: NEGATIVE

## 2013-06-20 MED ORDER — PREDNISONE 10 MG PO TABS: 20.0000 mg | ORAL_TABLET | Freq: Every day | ORAL | Status: DC

## 2013-06-20 MED ORDER — OXYCODONE-ACETAMINOPHEN 5-325 MG PO TABS: 1.0000 | ORAL_TABLET | ORAL | Status: DC | PRN

## 2013-06-20 MED ORDER — DIAZEPAM 2 MG PO TABS: 2.0000 mg | ORAL_TABLET | ORAL | Status: DC | PRN

## 2013-06-20 NOTE — Discharge Instructions (Signed)
Back Pain, Adult Low back pain is very common. About 1 in 5 people have back pain.The cause of low back pain is rarely dangerous. The pain often gets better over time.About half of people with a sudden onset of back pain feel better in just 2 weeks. About 8 in 10 people feel better by 6 weeks.  CAUSES Some common causes of back pain include:  Strain of the muscles or ligaments supporting the spine.  Wear and tear (degeneration) of the spinal discs.  Arthritis.  Direct injury to the back. DIAGNOSIS Most of the time, the direct cause of low back pain is not known.However, back pain can be treated effectively even when the exact cause of the pain is unknown.Answering your caregiver's questions about your overall health and symptoms is one of the most accurate ways to make sure the cause of your pain is not dangerous. If your caregiver needs more information, he or she may order lab work or imaging tests (X-rays or MRIs).However, even if imaging tests show changes in your back, this usually does not require surgery. HOME CARE INSTRUCTIONS For many people, back pain returns.Since low back pain is rarely dangerous, it is often a condition that people can learn to manageon their own.   Remain active. It is stressful on the back to sit or stand in one place. Do not sit, drive, or stand in one place for more than 30 minutes at a time. Take short walks on level surfaces as soon as pain allows.Try to increase the length of time you walk each day.  Do not stay in bed.Resting more than 1 or 2 days can delay your recovery.  Do not avoid exercise or work.Your body is made to move.It is not dangerous to be active, even though your back may hurt.Your back will likely heal faster if you return to being active before your pain is gone.  Pay attention to your body when you bend and lift. Many people have less discomfortwhen lifting if they bend their knees, keep the load close to their bodies,and  avoid twisting. Often, the most comfortable positions are those that put less stress on your recovering back.  Find a comfortable position to sleep. Use a firm mattress and lie on your side with your knees slightly bent. If you lie on your back, put a pillow under your knees.  Only take over-the-counter or prescription medicines as directed by your caregiver. Over-the-counter medicines to reduce pain and inflammation are often the most helpful.Your caregiver may prescribe muscle relaxant drugs.These medicines help dull your pain so you can more quickly return to your normal activities and healthy exercise.  Put ice on the injured area.  Put ice in a plastic bag.  Place a towel between your skin and the bag.  Leave the ice on for 15-20 minutes, 03-04 times a day for the first 2 to 3 days. After that, ice and heat may be alternated to reduce pain and spasms.  Ask your caregiver about trying back exercises and gentle massage. This may be of some benefit.  Avoid feeling anxious or stressed.Stress increases muscle tension and can worsen back pain.It is important to recognize when you are anxious or stressed and learn ways to manage it.Exercise is a great option. SEEK MEDICAL CARE IF:  You have pain that is not relieved with rest or medicine.  You have pain that does not improve in 1 week.  You have new symptoms.  You are generally not feeling well. SEEK   IMMEDIATE MEDICAL CARE IF:   You have pain that radiates from your back into your legs.  You develop new bowel or bladder control problems.  You have unusual weakness or numbness in your arms or legs.  You develop nausea or vomiting.  You develop abdominal pain.  You feel faint. Document Released: 02/11/2005 Document Revised: 08/13/2011 Document Reviewed: 07/02/2010 [employer] Patient Information 2014 [employer].  

## 2013-06-20 NOTE — ED Provider Notes (Signed)
CSN: [id number]     Arrival date & time 06/20/13  1307 History   First MD Initiated Contact with Patient 06/20/13 1450     Chief Complaint  Patient presents with  . Back Pain     (Consider location/radiation/quality/duration/timing/severity/associated sxs/prior Treatment) Patient is a 33 y.o. female presenting with back pain. The history is provided by the patient.  Back Pain  patient here complaining of three-week history of mid lower back pain. Seen by her physician and placed on Flexeril as well as Mobic. Pain is characterized as sharp and worse with movement. It is worse in the morning and better throughout the day. She denies any change in her bowel or bladder function. Did have some radiation to her lower extremity his. Denies striking her foot when she walks. She denies any recent history of trauma. No prior history of back injury. Symptoms have been progressively worse and a better with rest.  Past Medical History  Diagnosis Date  . Migraine   . Kidney stones   . Gallstones   . Acute pyelonephritis 06/29/2012   Past Surgical History  Procedure Laterality Date  . Abdominal hysterectomy    . Tonsillectomy    . Lithotripsy    . Cholecystectomy  03/11/2011    Procedure: LAPAROSCOPIC CHOLECYSTECTOMY WITH INTRAOPERATIVE CHOLANGIOGRAM;  Surgeon: Atilano InaEric M Wilson, MD;  [facility] OR;  Service: General;  Laterality: N/A;  . Cholecystectomy, laparoscopic    . Orif ankle fracture  12/25/2011    Procedure: OPEN REDUCTION INTERNAL FIXATION (ORIF) ANKLE FRACTURE;  Surgeon: Javier DockerJeffrey C Beane, MD;  Location: [facility] ORS;  Service: Orthopedics;  Laterality: Left;  . Cast application  12/25/2011    Procedure: CAST APPLICATION;  Surgeon: Javier DockerJeffrey C Beane, MD;  Location: [facility] ORS;  Service: Orthopedics;  Laterality: Left;   Family History  Problem Relation Age of Onset  . Diabetes Mother   . Diabetes Father   . Heart failure Father   . Hypertension Father   . Pulmonary fibrosis Father    History    Substance Use Topics  . Smoking status: Current Every Day Smoker -- 0.50 packs/day    Types: Cigarettes  . Smokeless tobacco: Not on file  . Alcohol Use: No     Comment: social   OB History   Grav Para Term Preterm Abortions TAB SAB Ect Mult Living                 Review of Systems  Musculoskeletal: Positive for back pain.  All other systems reviewed and are negative.     Allergies  Compazine and Metoclopramide hcl  Home Medications   Prior to Admission medications   Medication Sig Start Date End Date Taking? Authorizing Provider  cyclobenzaprine (FLEXERIL) 10 MG tablet Take 1 tablet by mouth daily. 06/16/13  Yes Historical Provider, MD  meloxicam (MOBIC) 15 MG tablet Take 1 tablet by mouth daily. 06/16/13  Yes Historical Provider, MD  omeprazole (PRILOSEC) 20 MG capsule Take 20 mg by mouth daily.   Yes Historical Provider, MD  rizatriptan (MAXALT) 10 MG tablet Take 1 tablet by mouth 2 (two) times daily as needed. 06/18/13  Yes Historical Provider, MD  ibuprofen (ADVIL,MOTRIN) 200 MG tablet Take 800 mg by mouth every 8 (eight) hours as needed for pain or headache.     Historical Provider, MD   BP 117/93  Pulse 115  Temp(Src) 98.7 F (37.1 C) (Oral)  Resp 18  SpO2 97% Physical Exam  Nursing note and vitals reviewed. Constitutional:  She is oriented to person, place, and time. She appears well-developed and well-nourished.  Non-toxic appearance. No distress.  HENT:  Head: Normocephalic and atraumatic.  Eyes: Conjunctivae, EOM and lids are normal. Pupils are equal, round, and reactive to light.  Neck: Normal range of motion. Neck supple. No tracheal deviation present. No mass present.  Cardiovascular: Normal rate, regular rhythm and normal heart sounds.  Exam reveals no gallop.   No murmur heard. Pulmonary/Chest: Effort normal and breath sounds normal. No stridor. No respiratory distress. She has no decreased breath sounds. She has no wheezes. She has no rhonchi. She has no  rales.  Abdominal: Soft. Normal appearance and bowel sounds are normal. She exhibits no distension. There is no tenderness. There is no rebound and no CVA tenderness.  Musculoskeletal: Normal range of motion. She exhibits no edema and no tenderness.       Arms: Neurological: She is alert and oriented to person, place, and time. She has normal strength. No cranial nerve deficit or sensory deficit. GCS eye subscore is 4. GCS verbal subscore is 5. GCS motor subscore is 6.  Skin: Skin is warm and dry. No abrasion and no rash noted.  Psychiatric: She has a normal mood and affect. Her speech is normal and behavior is normal.    ED Course  Procedures (including critical care time) Labs Review Labs Reviewed  URINALYSIS, ROUTINE W REFLEX MICROSCOPIC - Abnormal; Notable for the following:    Hgb urine dipstick TRACE (*)    Bilirubin Urine SMALL (*)    All other components within normal limits  URINE MICROSCOPIC-ADD ON  POC URINE PREG, ED    Imaging Review No results found.   EKG Interpretation None      MDM   Final diagnoses:  None    Patient ambulated in my presence without evidence of foot drop. No concern for cauda equina. Will start patient on course of prednisone, low dose diazepam, opiates. Patient to followup with her doctor.    Toy BakerAnthony T Lakina Mcintire, MD 06/20/13 1504

## 2013-06-20 NOTE — ED Notes (Signed)
Pt presents to department for evaluation of lower back pain, was seen by PCP and started on Mobic and Flexeril. Now states pain has increased, numbness to L leg and foot. 9/10 pain upon arrival, increases with movement. Pt is alert and oriented x4.

## 2013-07-12 ENCOUNTER — Encounter (HOSPITAL_COMMUNITY): Payer: Self-pay | Admitting: Emergency Medicine

## 2013-07-12 ENCOUNTER — Emergency Department (HOSPITAL_COMMUNITY): Payer: BC Managed Care – PPO

## 2013-07-12 ENCOUNTER — Emergency Department (HOSPITAL_COMMUNITY)
Admission: EM | Admit: 2013-07-12 | Discharge: 2013-07-12 | Disposition: A | Discharge status: Home / Self Care | Payer: BC Managed Care – PPO | Attending: Emergency Medicine | Admitting: Emergency Medicine

## 2013-07-12 DIAGNOSIS — G43909 Migraine, unspecified, not intractable, without status migrainosus: Secondary | ICD-10-CM | POA: Insufficient documentation

## 2013-07-12 DIAGNOSIS — F17200 Nicotine dependence, unspecified, uncomplicated: Secondary | ICD-10-CM | POA: Insufficient documentation

## 2013-07-12 DIAGNOSIS — Z9089 Acquired absence of other organs: Secondary | ICD-10-CM | POA: Insufficient documentation

## 2013-07-12 DIAGNOSIS — Z90710 Acquired absence of both cervix and uterus: Secondary | ICD-10-CM | POA: Insufficient documentation

## 2013-07-12 DIAGNOSIS — Z8719 Personal history of other diseases of the digestive system: Secondary | ICD-10-CM | POA: Insufficient documentation

## 2013-07-12 DIAGNOSIS — Z87440 Personal history of urinary (tract) infections: Secondary | ICD-10-CM | POA: Insufficient documentation

## 2013-07-12 DIAGNOSIS — Z79899 Other long term (current) drug therapy: Secondary | ICD-10-CM | POA: Insufficient documentation

## 2013-07-12 DIAGNOSIS — N200 Calculus of kidney: Secondary | ICD-10-CM

## 2013-07-12 DIAGNOSIS — IMO0002 Reserved for concepts with insufficient information to code with codable children: Secondary | ICD-10-CM | POA: Insufficient documentation

## 2013-07-12 LAB — URINE MICROSCOPIC-ADD ON

## 2013-07-12 LAB — URINALYSIS, ROUTINE W REFLEX MICROSCOPIC
Bilirubin Urine: NEGATIVE
Glucose, UA: NEGATIVE mg/dL
Ketones, ur: NEGATIVE mg/dL
Leukocytes, UA: NEGATIVE
Nitrite: NEGATIVE
Protein, ur: NEGATIVE mg/dL
Specific Gravity, Urine: 1.006 (ref 1.005–1.030)
Urobilinogen, UA: 0.2 mg/dL (ref 0.0–1.0)
pH: 6.5 (ref 5.0–8.0)

## 2013-07-12 LAB — CBC WITH DIFFERENTIAL/PLATELET
Basophils Absolute: 0.1 10*3/uL (ref 0.0–0.1)
Basophils Relative: 0 % (ref 0–1)
Eosinophils Absolute: 0.2 10*3/uL (ref 0.0–0.7)
Eosinophils Relative: 1 % (ref 0–5)
HCT: 39 % (ref 36.0–46.0)
Hemoglobin: 13.2 g/dL (ref 12.0–15.0)
Lymphocytes Relative: 33 % (ref 12–46)
Lymphs Abs: 4.1 10*3/uL — ABNORMAL HIGH (ref 0.7–4.0)
MCH: 30.9 pg (ref 26.0–34.0)
MCHC: 33.8 g/dL (ref 30.0–36.0)
MCV: 91.3 fL (ref 78.0–100.0)
Monocytes Absolute: 0.6 10*3/uL (ref 0.1–1.0)
Monocytes Relative: 5 % (ref 3–12)
Neutro Abs: 7.3 10*3/uL (ref 1.7–7.7)
Neutrophils Relative %: 61 % (ref 43–77)
Platelets: 250 10*3/uL (ref 150–400)
RBC: 4.27 MIL/uL (ref 3.87–5.11)
RDW: 13.3 % (ref 11.5–15.5)
WBC: 12.3 10*3/uL — ABNORMAL HIGH (ref 4.0–10.5)

## 2013-07-12 LAB — BASIC METABOLIC PANEL
BUN: 10 mg/dL (ref 6–23)
CO2: 24 mEq/L (ref 19–32)
Calcium: 9.5 mg/dL (ref 8.4–10.5)
Chloride: 104 mEq/L (ref 96–112)
Creatinine, Ser: 0.69 mg/dL (ref 0.50–1.10)
GFR calc Af Amer: >90 mL/min (ref >90)
GFR calc non Af Amer: >90 mL/min (ref >90)
Glucose, Bld: 86 mg/dL (ref 70–99)
Potassium: 3.9 mEq/L (ref 3.7–5.3)
Sodium: 140 mEq/L (ref 137–147)

## 2013-07-12 MED ORDER — SODIUM CHLORIDE 0.9 % IV BOLUS (SEPSIS)
1000.0000 mL | Freq: Once | INTRAVENOUS | Status: AC
  Administered 2013-07-12: 1000 mL via INTRAVENOUS

## 2013-07-12 MED ORDER — HYDROMORPHONE HCL PF 1 MG/ML IJ SOLN
1.0000 mg | Freq: Once | INTRAMUSCULAR | Status: AC
  Administered 2013-07-12: 1 mg via INTRAVENOUS
  Filled 2013-07-12: qty 1

## 2013-07-12 MED ORDER — OXYCODONE-ACETAMINOPHEN 5-325 MG PO TABS
1.0000 | ORAL_TABLET | Freq: Once | ORAL | Status: AC
  Administered 2013-07-12: 1 via ORAL
  Filled 2013-07-12: qty 1

## 2013-07-12 MED ORDER — ONDANSETRON 4 MG PO TBDP: 4.0000 mg | ORAL_TABLET | Freq: Three times a day (TID) | ORAL | Status: DC | PRN

## 2013-07-12 MED ORDER — OXYCODONE-ACETAMINOPHEN 5-325 MG PO TABS: 1.0000 | ORAL_TABLET | Freq: Four times a day (QID) | ORAL | Status: DC | PRN

## 2013-07-12 MED ORDER — ONDANSETRON HCL 4 MG/2ML IJ SOLN
4.0000 mg | Freq: Once | INTRAMUSCULAR | Status: AC
  Administered 2013-07-12: 4 mg via INTRAVENOUS
  Filled 2013-07-12: qty 2

## 2013-07-12 NOTE — ED Provider Notes (Signed)
CSN: [id number]     Arrival date & time 07/12/13  1725 History   First MD Initiated Contact with Patient 07/12/13 2034     Chief Complaint  Patient presents with  . Abdominal Pain     (Consider location/radiation/quality/duration/timing/severity/associated sxs/prior Treatment) HPI Comments: Patient with a history of Kidney Stones presents today with a chief complaint of right sided groin pain.  She reports that one week ago she had pain in her right flank area.  The pain has then moved to the right groin area.  Patient reports that the pain has worsened today.  She states that the pain becomes really intense at times and will then ease up.  She describes the pain as "spasms."  She reports that the pain improves with pacing around the room.  Pain similar to pain that she has had in the past with kidney stones.  She reports that her pain is associated with hematuria.  She denies fever or chills.  Denies dysuria, increased frequency, or urgency.  She states that she has had some difficulty urinating today.  She denies vaginal bleeding or vaginal discharge.  Past surgical history significant for Hysterectomy and Cholecystectomy.  The history is provided by the patient.    Past Medical History  Diagnosis Date  . Migraine   . Kidney stones   . Gallstones   . Acute pyelonephritis 06/29/2012   Past Surgical History  Procedure Laterality Date  . Abdominal hysterectomy    . Tonsillectomy    . Lithotripsy    . Cholecystectomy  03/11/2011    Procedure: LAPAROSCOPIC CHOLECYSTECTOMY WITH INTRAOPERATIVE CHOLANGIOGRAM;  Surgeon: Atilano InaEric M Wilson, MD;  [facility];  Service: General;  Laterality: N/A;  . Cholecystectomy, laparoscopic    . Orif ankle fracture  12/25/2011    Procedure: OPEN REDUCTION INTERNAL FIXATION (ORIF) ANKLE FRACTURE;  Surgeon: Javier DockerJeffrey C Beane, MD;  Location: [facility] ORS;  Service: Orthopedics;  Laterality: Left;  . Cast application  12/25/2011    Procedure: CAST APPLICATION;  Surgeon:  Javier DockerJeffrey C Beane, MD;  Location: [facility] ORS;  Service: Orthopedics;  Laterality: Left;   Family History  Problem Relation Age of Onset  . Diabetes Mother   . Diabetes Father   . Heart failure Father   . Hypertension Father   . Pulmonary fibrosis Father    History  Substance Use Topics  . Smoking status: Current Every Day Smoker -- 0.50 packs/day    Types: Cigarettes  . Smokeless tobacco: Not on file  . Alcohol Use: No     Comment: social   OB History   Grav Para Term Preterm Abortions TAB SAB Ect Mult Living                 Review of Systems  All other systems reviewed and are negative.     Allergies  Compazine and Metoclopramide hcl  Home Medications   Prior to Admission medications   Medication Sig Start Date End Date Taking? Authorizing Provider  diazepam (VALIUM) 2 MG tablet Take 1 tablet (2 mg total) by mouth every 4 (four) hours as needed for muscle spasms. 06/20/13   Toy BakerAnthony T Allen, MD  omeprazole (PRILOSEC) 20 MG capsule Take 20 mg by mouth daily.    Historical Provider, MD  oxyCODONE-acetaminophen (PERCOCET/ROXICET) 5-325 MG per tablet Take 1-2 tablets by mouth every 4 (four) hours as needed for severe pain. 06/20/13   Toy BakerAnthony T Allen, MD  predniSONE (DELTASONE) 10 MG tablet Take 2 tablets (20 mg total)  by mouth daily. 06/20/13   Toy BakerAnthony T Allen, MD  rizatriptan (MAXALT) 10 MG tablet Take 1 tablet by mouth 2 (two) times daily as needed. 06/18/13   Historical Provider, MD   BP 151/75  Pulse 98  Temp(Src) 97.8 F (36.6 C) (Oral)  Resp 18  SpO2 98% Physical Exam  Nursing note and vitals reviewed. Constitutional: She appears well-developed and well-nourished.  HENT:  Head: Normocephalic and atraumatic.  Mouth/Throat: Oropharynx is clear and moist.  Cardiovascular: Normal rate, regular rhythm and normal heart sounds.   Pulmonary/Chest: Effort normal and breath sounds normal.  Abdominal: Soft. Bowel sounds are normal. She exhibits no distension and no mass. There is  CVA tenderness. There is no rebound and no guarding.  Right CVA tenderness Very mild tenderness to palpation of the RLQ  Neurological: She is alert.  Skin: Skin is warm and dry.    ED Course  Procedures (including critical care time) Labs Review Labs Reviewed  URINALYSIS, ROUTINE W REFLEX MICROSCOPIC - Abnormal; Notable for the following:    APPearance CLOUDY (*)    Hgb urine dipstick LARGE (*)    All other components within normal limits  URINE MICROSCOPIC-ADD ON - Abnormal; Notable for the following:    Squamous Epithelial / LPF MANY (*)    All other components within normal limits    Imaging Review Ct Abdomen Pelvis Wo Contrast  07/12/2013   CLINICAL DATA:  R/o kidney stone  EXAM: CT ABDOMEN AND PELVIS WITHOUT CONTRAST  TECHNIQUE: Multidetector CT imaging of the abdomen and pelvis was performed following the standard protocol without IV contrast.  COMPARISON:  CT ABD/PELVIS W CM dated 01/11/2013  FINDINGS: The lung bases are negative.  Noncontrast evaluation of the liver, spleen, adrenals, pancreas is unremarkable. Incidental note made of a small splenule posterior to the anterior tip of the spleen. Patient is status post cholecystectomy. Nonobstructing 4 mm and 3 mm medullary calculi left kidney no evidence of ureteral calculi. No hydronephrosis or hydroureter.  Nonobstructing 3.5 mm calculus medullary lower pole region right kidney. Right kidney and ureter otherwise unremarkable.  Moderate amount of fecal retention within the colon. An appendicolith within the orifice of the appendix which is otherwise negative. The bowel is otherwise negative.  Within limitations of a noncontrast CT no abdominal or pelvic masses, free fluid, loculated fluid collections, or adenopathy. No abdominal aortic aneurysm.  No abdominal wall or inguinal hernia.  There are no aggressive appearing osseous lesions.  IMPRESSION: Nonobstructing bilateral renal calculi. No evidence of obstructive uropathy.   Appendicolith without evidence of appendicitis.  No further focal acute abnormalities.   Electronically Signed   By: Salome HolmesHector  Cooper M.D.   On: 07/12/2013 21:52     EKG Interpretation None     10:09 PM Patient states that her pain is controlled at this time.  No abdominal tenderness to palpation.  She feels that she may have passed the stone.  MDM   Final diagnoses:  None   Patient with a history of Kidney Stones presents today with hematuria, flank pain, and pain of the right groin.  She reports that pain is similar to prior kidney stones. Labs today unremarkable.  UA showing hemoglobin, but no evidence of infection.  CT showing no evidence of ureteral calculi, but showing nonobstructing bilateral renal calculi.  Pain improved during ED course.  Patient stable for discharge.  Return precautions given.    Santiago GladHeather Ashaunti Treptow, PA-C 07/13/13 [phone]

## 2013-07-12 NOTE — ED Notes (Signed)
Pt c/o pain in RLQ since last week.  St's pain has become more intense today.  Hx of kidney stones

## 2013-07-12 NOTE — ED Notes (Signed)
Patient transported to CT 

## 2013-07-12 NOTE — ED Notes (Signed)
The pt states her pain is severe and she can not wait any longer. Pain medication offered.

## 2013-07-13 NOTE — ED Provider Notes (Signed)
Medical screening examination/treatment/procedure(s) were performed by non-physician practitioner and as supervising physician I was immediately available for consultation/collaboration.   EKG Interpretation None        Dagmar HaitWilliam Catheline Hixon, MD 07/13/13 1500

## 2013-07-15 ENCOUNTER — Ambulatory Visit (INDEPENDENT_AMBULATORY_CARE_PROVIDER_SITE_OTHER): Payer: BC Managed Care – PPO | Admitting: General Surgery

## 2013-07-15 ENCOUNTER — Encounter: Payer: Self-pay | Admitting: General Surgery

## 2013-07-15 VITALS — BP 118/78 | HR 80 | Temp 98.6°F | Resp 12 | Ht 67.0 in | Wt 212.0 lb

## 2013-07-15 DIAGNOSIS — R109 Unspecified abdominal pain: Secondary | ICD-10-CM

## 2013-07-15 DIAGNOSIS — K389 Disease of appendix, unspecified: Secondary | ICD-10-CM

## 2013-07-15 DIAGNOSIS — N12 Tubulo-interstitial nephritis, not specified as acute or chronic: Secondary | ICD-10-CM

## 2013-07-15 NOTE — Progress Notes (Signed)
Patient ID: Sophia LyonRebecca M Mccarter, female   DOB: [birth year], 33 y.o.   MRN: [id number]  Chief Complaint  Patient presents with  . Abdominal Pain    HPI [name] is a 33 y.o. female here today for a evaluation for abdominal pain. States she first had the pain and [facility]. The pain is a constant dull ache RLQ and lower right back pain. She has chronic constipation and bowels move every other day with assist of MOM. She is being treated for pyelonephritis. The patient has a long history of recurrent kidney stones. She was treated in the last 10 days with a diagnosis of presumptive bowel nephritis. She required hospitalization in May of 2014 for pyelonephritis.  The patient developed some discomfort in the right lower quadrant in the early morning hours of May 18. This was severe enough by the evening for her to present to the ED for evaluation. She reported the symptoms were similar to what she experience with stone passage in the past. CT scan did not show evidence of right renal calculi but did suggest an appendicolith without dilatation of the appendix or very appendiceal changes. She had a mild white blood cell count 12,000 and she was encouraged to have outpatient followup with her PCP.  The patient saw her primary care physician yesterday and is seen today for assessment. The patient reports that she has had some modest improvement in the right lower quadrant discomfort, and certainly no exacerbation. 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   HPI  Past Medical History  Diagnosis Date  . Migraine   . Kidney stones   . Gallstones   . Acute pyelonephritis 06/29/2012  . Pyelonephritis 2015    Past Surgical History  Procedure Laterality Date  . Abdominal hysterectomy    . Tonsillectomy    . Lithotripsy    . Cholecystectomy  03/11/2011    Procedure: LAPAROSCOPIC CHOLECYSTECTOMY WITH INTRAOPERATIVE CHOLANGIOGRAM;   Surgeon: Atilano InaEric M Wilson, MD;  [facility];  Service: General;  Laterality: N/A;  . Cholecystectomy, laparoscopic    . Orif ankle fracture  12/25/2011    Procedure: OPEN REDUCTION INTERNAL FIXATION (ORIF) ANKLE FRACTURE;  Surgeon: Javier DockerJeffrey C Beane, MD;  Location: [facility];  Service: Orthopedics;  Laterality: Left;  . Cast application  12/25/2011    Procedure: CAST APPLICATION;  Surgeon: Javier DockerJeffrey C Beane, MD;  Location: [facility] OR[facility];  Service: Orthopedics;  Laterality: Left;    Family History  Problem Relation Age of Onset  . Diabetes Mother   . Diabetes Father   . Heart failure Father   . Hypertension Father   . Pulmonary fibrosis Father     Social History History  Substance Use Topics  . Smoking status: Current Every Day Smoker -- 0.50 packs/day    Types: Cigarettes  . Smokeless tobacco: Never Used  . Alcohol Use: No     Comment: social    Allergies  Allergen Reactions  . Compazine Other (See Comments)    dystonia  . Metoclopramide Hcl Other (See Comments)    dystonia    Current Outpatient Prescriptions  Medication Sig Dispense Refill  . ciprofloxacin (CIPRO) 500 MG tablet Take 500 mg by mouth 2 (two) times daily. Take for 10 days. Patient states she is on her fifth day as of today (07-12-13)      . eletriptan (RELPAX) 40 MG tablet Take 40 mg by  mouth daily as needed for migraine or headache. One tablet by mouth at onset of headache. May repeat in 2 hours if headache persists or recurs.      Marland Kitchen. omeprazole (PRILOSEC) 20 MG capsule Take 20 mg by mouth daily.      . ondansetron (ZOFRAN ODT) 4 MG disintegrating tablet Take 1 tablet (4 mg total) by mouth every 8 (eight) hours as needed for nausea or vomiting.  20 tablet  0  . oxyCODONE-acetaminophen (PERCOCET/ROXICET) 5-325 MG per tablet Take 1-2 tablets by mouth every 6 (six) hours as needed for severe pain.  20 tablet  0  . promethazine (PHENERGAN) 25 MG tablet Take 1 tablet by mouth daily.       No current facility-administered  medications for this visit.    Review of Systems Review of Systems  Constitutional: Negative.   Respiratory: Negative.   Cardiovascular: Negative.   Gastrointestinal: Positive for nausea, abdominal pain and constipation. Negative for vomiting, diarrhea and blood in stool.    Blood pressure 118/78, pulse 80, temperature 98.6 F (37 C), temperature source Oral, resp. rate 12, height 5\' 7"  (1.702 m), weight 212 lb (96.163 kg).  Physical Exam Physical Exam  Constitutional: She is oriented to person, place, and time. She appears well-developed and well-nourished.  Eyes: Conjunctivae are normal.  Neck: Neck supple.  Cardiovascular: Normal rate, regular rhythm and normal heart sounds.   Pulmonary/Chest: Effort normal and breath sounds normal.  Abdominal: Soft. Normal appearance and bowel sounds are normal. There is tenderness in the right lower quadrant.  Mild RLQ tenderness.   Lymphadenopathy:    She has no cervical adenopathy.  Neurological: She is alert and oriented to person, place, and time.  Skin: Skin is warm and dry.    Data Reviewed Abdominal pelvic CT for stone disease dated 07/12/2048 was reviewed. There are no periappendiceal inflammatory changes. Evidence of a appendicolith is seen. No appendiceal dilatation.  Assessment    Appendicolith, no evidence of acute appendicitis.  Likely past right renal stone.  Recent pyelonephritis.    Plan    I think the patient will be best served by elective appendectomy a time convenient to her. Considering the size of the appendicolith and her frequent bouts of renal colic, she'll be best served by having elective appendectomy.  The patient will look at her calendar and see when this would be convenient for her. She was encouraged to call early should she have exacerbation of her right lower quadrant pain.    Discussed in detail risk and benefits of having elective appendectomy.   PCP: Hoy FinlaySowles, Krichna   Jeffrey W  Byrnett 07/16/2013, 9:29 AM

## 2013-07-15 NOTE — Patient Instructions (Signed)
The patient is aware to call back for any questions or concerns.  

## 2013-07-16 DIAGNOSIS — K389 Disease of appendix, unspecified: Secondary | ICD-10-CM | POA: Insufficient documentation

## 2013-07-16 DIAGNOSIS — R1032 Left lower quadrant pain: Secondary | ICD-10-CM | POA: Insufficient documentation

## 2013-07-16 DIAGNOSIS — N12 Tubulo-interstitial nephritis, not specified as acute or chronic: Secondary | ICD-10-CM | POA: Insufficient documentation

## 2013-07-26 ENCOUNTER — Observation Stay: Payer: Self-pay | Admitting: General Surgery

## 2013-07-26 DIAGNOSIS — R1031 Right lower quadrant pain: Secondary | ICD-10-CM

## 2013-07-26 HISTORY — PX: APPENDECTOMY: SHX54

## 2013-07-26 LAB — URINALYSIS, COMPLETE
Bacteria: NONE SEEN
Bilirubin,UR: NEGATIVE
Glucose,UR: NEGATIVE mg/dL (ref 0–75)
Hyaline Cast: 2
Ketone: NEGATIVE
Leukocyte Esterase: NEGATIVE
Nitrite: NEGATIVE
Ph: 5 (ref 4.5–8.0)
Protein: NEGATIVE
RBC,UR: 7 /HPF (ref 0–5)
Specific Gravity: 1.02 (ref 1.003–1.030)
Squamous Epithelial: 1
WBC UR: 2 /HPF (ref 0–5)

## 2013-07-26 LAB — CBC WITH DIFFERENTIAL/PLATELET
Basophil #: 0.1 10*3/uL (ref 0.0–0.1)
Basophil %: 0.8 %
Eosinophil #: 0.1 10*3/uL (ref 0.0–0.7)
Eosinophil %: 0.9 %
HCT: 41.7 % (ref 35.0–47.0)
HGB: 13.9 g/dL (ref 12.0–16.0)
Lymphocyte #: 2.2 10*3/uL (ref 1.0–3.6)
Lymphocyte %: 21.8 %
MCH: 30.6 pg (ref 26.0–34.0)
MCHC: 33.3 g/dL (ref 32.0–36.0)
MCV: 92 fL (ref 80–100)
Monocyte #: 0.5 x10 3/mm (ref 0.2–0.9)
Monocyte %: 5 %
Neutrophil #: 7.1 10*3/uL — ABNORMAL HIGH (ref 1.4–6.5)
Neutrophil %: 71.5 %
Platelet: 226 10*3/uL (ref 150–440)
RBC: 4.53 10*6/uL (ref 3.80–5.20)
RDW: 13.3 % (ref 11.5–14.5)
WBC: 10 10*3/uL (ref 3.6–11.0)

## 2013-07-26 LAB — COMPREHENSIVE METABOLIC PANEL
Albumin: 3.9 g/dL (ref 3.4–5.0)
Alkaline Phosphatase: 90 U/L
Anion Gap: 6 — ABNORMAL LOW (ref 7–16)
BUN: 10 mg/dL (ref 7–18)
Bilirubin,Total: 0.2 mg/dL (ref 0.2–1.0)
Calcium, Total: 9.3 mg/dL (ref 8.5–10.1)
Chloride: 105 mmol/L (ref 98–107)
Co2: 27 mmol/L (ref 21–32)
Creatinine: 0.67 mg/dL (ref 0.60–1.30)
EGFR (African American): >60
EGFR (Non-African Amer.): >60
Glucose: 86 mg/dL (ref 65–99)
Osmolality: 274 (ref 275–301)
Potassium: 3.8 mmol/L (ref 3.5–5.1)
SGOT(AST): 14 U/L — ABNORMAL LOW (ref 15–37)
SGPT (ALT): 15 U/L (ref 12–78)
Sodium: 138 mmol/L (ref 136–145)
Total Protein: 7.7 g/dL (ref 6.4–8.2)

## 2013-07-26 LAB — LIPASE, BLOOD: Lipase: 169 U/L (ref 73–393)

## 2013-07-27 ENCOUNTER — Encounter: Payer: Self-pay | Admitting: General Surgery

## 2013-07-27 ENCOUNTER — Telehealth: Payer: Self-pay | Admitting: *Deleted

## 2013-07-27 LAB — PATHOLOGY REPORT

## 2013-07-27 NOTE — Telephone Encounter (Signed)
RTW June 3

## 2013-07-28 ENCOUNTER — Encounter: Payer: Self-pay | Admitting: General Surgery

## 2013-07-28 ENCOUNTER — Other Ambulatory Visit: Payer: Self-pay

## 2013-08-04 ENCOUNTER — Encounter: Payer: Self-pay | Admitting: General Surgery

## 2013-08-04 ENCOUNTER — Ambulatory Visit (INDEPENDENT_AMBULATORY_CARE_PROVIDER_SITE_OTHER): Payer: Self-pay | Admitting: General Surgery

## 2013-08-04 VITALS — BP 124/76 | HR 74 | Resp 12 | Ht 67.0 in | Wt 210.0 lb

## 2013-08-04 DIAGNOSIS — N83209 Unspecified ovarian cyst, unspecified side: Secondary | ICD-10-CM

## 2013-08-04 NOTE — Patient Instructions (Signed)
Patient to return as needed. 

## 2013-08-04 NOTE — Progress Notes (Signed)
Patient ID: [name], female   DOB: 1980/11/23, 33 y.o.   MRN: [id number]  Chief Complaint  Patient presents with  . Routine Post Op    appendectomy    HPI [name] is a 33 y.o. female here today for her post op appendectomy done on 07/26/13. Patient states she is having  pain in her lower left quadrant constantly since surgery. HPI  Past Medical History  Diagnosis Date  . Migraine   . Kidney stones   . Gallstones   . Acute pyelonephritis 06/29/2012  . Pyelonephritis 2015    Past Surgical History  Procedure Laterality Date  . Abdominal hysterectomy    . Tonsillectomy    . Lithotripsy    . Cholecystectomy  03/11/2011    Procedure: LAPAROSCOPIC CHOLECYSTECTOMY WITH INTRAOPERATIVE CHOLANGIOGRAM;  Surgeon: Atilano Ina, MD;  Location: [facility] OR;  Service: General;  Laterality: N/A;  . Cholecystectomy, laparoscopic    . Orif ankle fracture  12/25/2011    Procedure: OPEN REDUCTION INTERNAL FIXATION (ORIF) ANKLE FRACTURE;  Surgeon: Javier Docker, MD;  Location: [facility];  Service: Orthopedics;  Laterality: Left;  . Cast application  12/25/2011    Procedure: CAST APPLICATION;  Surgeon: Javier Docker, MD;  Location: [facility] ORS;  Service: Orthopedics;  Laterality: Left;  . Appendectomy  07/26/13    Family History  Problem Relation Age of Onset  . Diabetes Mother   . Diabetes Father   . Heart failure Father   . Hypertension Father   . Pulmonary fibrosis Father     Social History History  Substance Use Topics  . Smoking status: Current Every Day Smoker -- 0.50 packs/day    Types: Cigarettes  . Smokeless tobacco: Never Used  . Alcohol Use: No     Comment: social    Allergies  Allergen Reactions  . Compazine Other (See Comments)    dystonia  . Metoclopramide Hcl Other (See Comments)    dystonia    Current Outpatient Prescriptions  Medication Sig Dispense Refill  . eletriptan (RELPAX) 40 MG tablet Take 40 mg by mouth daily as needed for migraine or headache.  One tablet by mouth at onset of headache. May repeat in 2 hours if headache persists or recurs.      Marland Kitchen omeprazole (PRILOSEC) 20 MG capsule Take 20 mg by mouth daily.       No current facility-administered medications for this visit.    Review of Systems Review of Systems  Constitutional: Negative.   Respiratory: Negative.   Cardiovascular: Negative.   Gastrointestinal: Positive for abdominal pain. Negative for nausea, vomiting, diarrhea, constipation, blood in stool, abdominal distention, anal bleeding and rectal pain.    Blood pressure 124/76, pulse 74, resp. rate 12, height 5\' 7"  (1.702 m), weight 210 lb (95.255 kg).  Physical Exam Physical Exam  Constitutional: She is oriented to person, place, and time. She appears well-developed and well-nourished.  Eyes: Conjunctivae are normal. No scleral icterus.  Neck: Neck supple.  Cardiovascular: Normal rate, regular rhythm and normal heart sounds.   Pulmonary/Chest: Effort normal and breath sounds normal.  Abdominal: Soft. Normal appearance and bowel sounds are normal.    Port sites looks clean and healing well.   Neurological: She is alert and oriented to person, place, and time.  Skin: Skin is warm and dry.    Data Reviewed Pathology showed fibrous obliteration of the tip of the appendix. No evidence of acute appendicitis.  Intraoperative examination showed changes in the right ovary  suggestive of a ruptured ovarian cyst.  Assessment    Doing well status post interval appendectomy.    Plan    Patient may resume activities as tolerated. She was reassured that the left-sided discomfort she is experiencing near the port site is secondary to the procedure and will resolve with time.    PCP: Dimitri PedSowles, Krichna    Candise Crabtree W 08/06/2013, 2:07 PM

## 2013-08-06 DIAGNOSIS — N83209 Unspecified ovarian cyst, unspecified side: Secondary | ICD-10-CM | POA: Insufficient documentation

## 2013-12-02 ENCOUNTER — Emergency Department: Payer: Self-pay | Admitting: Emergency Medicine

## 2013-12-02 ENCOUNTER — Ambulatory Visit: Payer: Self-pay | Admitting: Family Medicine

## 2013-12-02 LAB — URINALYSIS, COMPLETE
Bacteria: NONE SEEN
Bilirubin,UR: NEGATIVE
Glucose,UR: NEGATIVE mg/dL (ref 0–75)
Ketone: NEGATIVE
Leukocyte Esterase: NEGATIVE
Nitrite: NEGATIVE
Ph: 7 (ref 4.5–8.0)
Protein: NEGATIVE
RBC,UR: 1013 /HPF (ref 0–5)
Specific Gravity: 1.011 (ref 1.003–1.030)
Squamous Epithelial: <1
WBC UR: 2 /HPF (ref 0–5)

## 2013-12-02 LAB — CBC
HCT: 42.4 % (ref 35.0–47.0)
HGB: 13.8 g/dL (ref 12.0–16.0)
MCH: 30.1 pg (ref 26.0–34.0)
MCHC: 32.6 g/dL (ref 32.0–36.0)
MCV: 92 fL (ref 80–100)
Platelet: 230 10*3/uL (ref 150–440)
RBC: 4.6 10*6/uL (ref 3.80–5.20)
RDW: 13.4 % (ref 11.5–14.5)
WBC: 9.2 10*3/uL (ref 3.6–11.0)

## 2013-12-02 LAB — COMPREHENSIVE METABOLIC PANEL
Albumin: 4.2 g/dL (ref 3.4–5.0)
Alkaline Phosphatase: 91 U/L
Anion Gap: 9 (ref 7–16)
BUN: 12 mg/dL (ref 7–18)
Bilirubin,Total: 0.4 mg/dL (ref 0.2–1.0)
Calcium, Total: 9.9 mg/dL (ref 8.5–10.1)
Chloride: 110 mmol/L — ABNORMAL HIGH (ref 98–107)
Co2: 22 mmol/L (ref 21–32)
Creatinine: 0.9 mg/dL (ref 0.60–1.30)
EGFR (African American): >60
EGFR (Non-African Amer.): >60
Glucose: 87 mg/dL (ref 65–99)
Osmolality: 280 (ref 275–301)
Potassium: 3.5 mmol/L (ref 3.5–5.1)
SGOT(AST): 16 U/L (ref 15–37)
SGPT (ALT): 17 U/L
Sodium: 141 mmol/L (ref 136–145)
Total Protein: 7.9 g/dL (ref 6.4–8.2)

## 2013-12-02 LAB — LIPASE, BLOOD: Lipase: 151 U/L (ref 73–393)

## 2013-12-21 ENCOUNTER — Ambulatory Visit: Payer: Self-pay

## 2013-12-27 ENCOUNTER — Encounter: Payer: Self-pay | Admitting: General Surgery

## 2014-04-21 ENCOUNTER — Encounter (HOSPITAL_COMMUNITY): Payer: Self-pay | Admitting: Emergency Medicine

## 2014-04-21 ENCOUNTER — Emergency Department (HOSPITAL_COMMUNITY): Payer: BLUE CROSS/BLUE SHIELD

## 2014-04-21 ENCOUNTER — Emergency Department (HOSPITAL_COMMUNITY)
Admission: EM | Admit: 2014-04-21 | Discharge: 2014-04-21 | Disposition: A | Discharge status: Home / Self Care | Payer: BLUE CROSS/BLUE SHIELD | Attending: Emergency Medicine | Admitting: Emergency Medicine

## 2014-04-21 DIAGNOSIS — Z87442 Personal history of urinary calculi: Secondary | ICD-10-CM | POA: Diagnosis not present

## 2014-04-21 DIAGNOSIS — R1032 Left lower quadrant pain: Secondary | ICD-10-CM

## 2014-04-21 DIAGNOSIS — G43909 Migraine, unspecified, not intractable, without status migrainosus: Secondary | ICD-10-CM | POA: Insufficient documentation

## 2014-04-21 DIAGNOSIS — Z3202 Encounter for pregnancy test, result negative: Secondary | ICD-10-CM | POA: Diagnosis not present

## 2014-04-21 DIAGNOSIS — Z720 Tobacco use: Secondary | ICD-10-CM | POA: Insufficient documentation

## 2014-04-21 DIAGNOSIS — Z79899 Other long term (current) drug therapy: Secondary | ICD-10-CM | POA: Diagnosis not present

## 2014-04-21 DIAGNOSIS — R109 Unspecified abdominal pain: Secondary | ICD-10-CM | POA: Diagnosis present

## 2014-04-21 DIAGNOSIS — Z8719 Personal history of other diseases of the digestive system: Secondary | ICD-10-CM | POA: Insufficient documentation

## 2014-04-21 LAB — WET PREP, GENITAL
Trich, Wet Prep: NONE SEEN
WBC, Wet Prep HPF POC: NONE SEEN
Yeast Wet Prep HPF POC: NONE SEEN

## 2014-04-21 LAB — CBC WITH DIFFERENTIAL/PLATELET
Basophils Absolute: 0.1 10*3/uL (ref 0.0–0.1)
Basophils Relative: 1 % (ref 0–1)
Eosinophils Absolute: 0 10*3/uL (ref 0.0–0.7)
Eosinophils Relative: 1 % (ref 0–5)
HCT: 41.1 % (ref 36.0–46.0)
Hemoglobin: 14.1 g/dL (ref 12.0–15.0)
Lymphocytes Relative: 34 % (ref 12–46)
Lymphs Abs: 2 10*3/uL (ref 0.7–4.0)
MCH: 31.4 pg (ref 26.0–34.0)
MCHC: 34.3 g/dL (ref 30.0–36.0)
MCV: 91.5 fL (ref 78.0–100.0)
Monocytes Absolute: 0.3 10*3/uL (ref 0.1–1.0)
Monocytes Relative: 5 % (ref 3–12)
Neutro Abs: 3.6 10*3/uL (ref 1.7–7.7)
Neutrophils Relative %: 59 % (ref 43–77)
Platelets: 241 10*3/uL (ref 150–400)
RBC: 4.49 MIL/uL (ref 3.87–5.11)
RDW: 12.8 % (ref 11.5–15.5)
WBC: 6 10*3/uL (ref 4.0–10.5)

## 2014-04-21 LAB — URINALYSIS, ROUTINE W REFLEX MICROSCOPIC
Bilirubin Urine: NEGATIVE
Glucose, UA: NEGATIVE mg/dL
Ketones, ur: 15 mg/dL — AB
Nitrite: NEGATIVE
Protein, ur: NEGATIVE mg/dL
Specific Gravity, Urine: 1.027 (ref 1.005–1.030)
Urobilinogen, UA: 0.2 mg/dL (ref 0.0–1.0)
pH: 6.5 (ref 5.0–8.0)

## 2014-04-21 LAB — I-STAT CHEM 8, ED
BUN: 13 mg/dL (ref 6–23)
Calcium, Ion: 1.2 mmol/L (ref 1.12–1.23)
Chloride: 112 mmol/L (ref 96–112)
Creatinine, Ser: 0.7 mg/dL (ref 0.50–1.10)
Glucose, Bld: 85 mg/dL (ref 70–99)
HCT: 42 % (ref 36.0–46.0)
Hemoglobin: 14.3 g/dL (ref 12.0–15.0)
Potassium: 3.7 mmol/L (ref 3.5–5.1)
Sodium: 141 mmol/L (ref 135–145)
TCO2: 16 mmol/L (ref 0–100)

## 2014-04-21 LAB — URINE MICROSCOPIC-ADD ON

## 2014-04-21 LAB — POC URINE PREG, ED: Preg Test, Ur: NEGATIVE

## 2014-04-21 MED ORDER — MORPHINE SULFATE 4 MG/ML IJ SOLN
4.0000 mg | Freq: Once | INTRAMUSCULAR | Status: AC
  Administered 2014-04-21: 4 mg via INTRAVENOUS
  Filled 2014-04-21: qty 1

## 2014-04-21 MED ORDER — ONDANSETRON HCL 4 MG/2ML IJ SOLN
4.0000 mg | Freq: Once | INTRAMUSCULAR | Status: AC
  Administered 2014-04-21: 4 mg via INTRAVENOUS
  Filled 2014-04-21: qty 2

## 2014-04-21 MED ORDER — OXYCODONE-ACETAMINOPHEN 5-325 MG PO TABS: ORAL_TABLET | ORAL | Status: DC

## 2014-04-21 MED ORDER — KETOROLAC TROMETHAMINE 30 MG/ML IJ SOLN
30.0000 mg | Freq: Once | INTRAMUSCULAR | Status: AC
  Administered 2014-04-21: 30 mg via INTRAVENOUS
  Filled 2014-04-21: qty 1

## 2014-04-21 MED ORDER — ONDANSETRON HCL 4 MG PO TABS: 4.0000 mg | ORAL_TABLET | Freq: Three times a day (TID) | ORAL | Status: DC | PRN

## 2014-04-21 NOTE — ED Provider Notes (Signed)
CSN: [id number]     Arrival date & time 04/21/14  4098 History   First MD Initiated Contact with Patient 04/21/14 [phone]     Chief Complaint  Patient presents with  . Nephrolithiasis     (Consider location/radiation/quality/duration/timing/severity/associated sxs/prior Treatment) HPI   [name] is a 34 y.o. female complaining of 10 out of 10 left lower quadrant pain onset last night described as colicky and consistent with prior kidney stones. Patient states pain is 8 out of 10 right now, it improved spontaneously. Patient states she was taking ibuprofen at home with little relief. She's had 4 lithotripsies, basket retrieval and stent placement in the past, she follows with urologist in [place]. She reports nausea but denies fever, chills hematuria, dysuria, melena, hematochezia, abnormal vaginal discharge. 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 She states that they were heme positive. Patient also has a history of ovarian cysts.  Past Medical History  Diagnosis Date  . Migraine   . Kidney stones   . Gallstones   . Acute pyelonephritis 06/29/2012  . Pyelonephritis 2015   Past Surgical History  Procedure Laterality Date  . Abdominal hysterectomy    . Tonsillectomy    . Lithotripsy    . Cholecystectomy  03/11/2011    Procedure: LAPAROSCOPIC CHOLECYSTECTOMY WITH INTRAOPERATIVE CHOLANGIOGRAM;  Surgeon: Atilano Ina, MD;  Location: [facility] OR;  Service: General;  Laterality: N/A;  . Cholecystectomy, laparoscopic    . Orif ankle fracture  12/25/2011    Procedure: OPEN REDUCTION INTERNAL FIXATION (ORIF) ANKLE FRACTURE;  Surgeon: Javier Docker, MD;  Location: [facility] ORS;  Service: Orthopedics;  Laterality: Left;  . Cast application  12/25/2011    Procedure: CAST APPLICATION;  Surgeon: Javier Docker, MD;  Location: [facility]S;  Service: Orthopedics;  Laterality: Left;  . Appendectomy  07/26/13   Family History  Problem Relation Age of Onset  . Diabetes Mother   .  Diabetes Father   . Heart failure Father   . Hypertension Father   . Pulmonary fibrosis Father    History  Substance Use Topics  . Smoking status: Current Every Day Smoker -- 0.50 packs/day    Types: Cigarettes  . Smokeless tobacco: Never Used  . Alcohol Use: No     Comment: social   OB History    Gravida Para Term Preterm AB TAB SAB Ectopic Multiple Living   Obstetric Comments   1st Menstrual Cycle:  10 1st Pregnancy:  17     Review of Systems   10 systems reviewed and found to be negative, except as noted in the HPI.   Allergies  Compazine and Metoclopramide hcl  Home Medications   Prior to Admission medications   Medication Sig Start Date End Date Taking? Authorizing Provider  eletriptan (RELPAX) 40 MG tablet Take 40 mg by mouth daily as needed for migraine or headache. One tablet by mouth at onset of headache. May repeat in 2 hours if headache persists or recurs.    Historical Provider, MD  omeprazole (PRILOSEC) 20 MG capsule Take 20 mg by mouth daily.    Historical Provider, MD   BP 130/73 mmHg  Pulse 72  Temp(Src) 97.9 F (36.6 C) (Oral)  Resp 20  SpO2 99% Physical Exam  Constitutional: She is oriented to person, place, and time. She appears well-developed and well-nourished. No distress.  Calm and comfortable appearing  HENT:  Head: Normocephalic.  Mouth/Throat: Oropharynx is clear and moist.  Eyes: Conjunctivae and EOM are normal. Pupils are equal, round, and reactive to light.  Neck: Normal range of motion.  Cardiovascular: Normal rate.   Pulmonary/Chest: Effort normal and breath sounds normal. No stridor.  Abdominal: Soft. Bowel sounds are normal. She exhibits no distension and no mass. There is no tenderness. There is no rebound and no guarding.  Genitourinary:  No CVA tenderness to palpation bilaterally.   Pelvic exam a chaperoned by nurse: No rashes or lesions, no cervical or adnexal tenderness to palpation.  Musculoskeletal:  Normal range of motion.  Neurological: She is alert and oriented to person, place, and time.  Psychiatric: She has a normal mood and affect.  Nursing note and vitals reviewed.   ED Course  Procedures (including critical care time) Labs Review Labs Reviewed  WET PREP, GENITAL  URINALYSIS, ROUTINE W REFLEX MICROSCOPIC  CBC WITH DIFFERENTIAL/PLATELET  I-STAT CHEM 8, ED  POC URINE PREG, ED  GC/CHLAMYDIA PROBE AMP ([place])    Imaging Review No results found.   EKG Interpretation None      MDM   Final diagnoses:  Flank pain  LLQ abdominal pain  History of kidney stones   Filed Vitals:   04/21/14 1008  BP: 130/73  Pulse: 72  Temp: 97.9 F (36.6 C)  TempSrc: Oral  Resp: 20  SpO2: 99%    Medications  morphine 4 MG/ML injection 4 mg (not administered)  ondansetron (ZOFRAN) injection 4 mg (not administered)    Ethelle LyonRebecca M Klees is a pleasant 34 y.o. female presenting with severe left lower quadrant pain onset last night consistent with prior episodes of kidney stones. Patient has required multiple interventions to pass stones in her past. Patient is afebrile, well-appearing, tolerating by mouth. Urinalysis with hemoglobin but no signs of infection. Urine culture is sent.  Pelvic exam is unremarkable, wet prep with no significant abnormalities. Renal ultrasound with no hydronephrosis. Serial abdominal exams are benign. Patient has no GI symptoms to suggest a diverticulitis. Encouraged her to follow closely with urology in her primary care physician.  Evaluation does not show pathology that would require ongoing emergent intervention or inpatient treatment. Pt is hemodynamically stable and mentating appropriately. Discussed findings and plan with patient/guardian, who agrees with care plan. All questions answered. Return precautions discussed and outpatient follow up given.   New Prescriptions   ONDANSETRON (ZOFRAN) 4 MG TABLET    Take 1 tablet (4 mg total) by mouth  every 8 (eight) hours as needed for nausea or vomiting.   OXYCODONE-ACETAMINOPHEN (PERCOCET/ROXICET) 5-325 MG PER TABLET    1 to 2 tabs PO q6hrs  PRN for pain         Wynetta Emeryicole Kani Jobson, PA-C 04/21/14 1222  Nelia Shiobert L Beaton, MD 04/21/14 1242

## 2014-04-21 NOTE — ED Notes (Signed)
Pain started last night, left groin area-- has hx of multiple kidney stones, with lithotripsy. Pt pacing in room.

## 2014-04-21 NOTE — Discharge Instructions (Signed)
Take percocet for breakthrough pain, do not drink alcohol, drive, care for children or do other critical tasks while taking percocet.  Please follow with your primary care doctor in the next 2 days for a check-up. They must obtain records for further management.   Do not hesitate to return to the Emergency Department for any new, worsening or concerning symptoms.     Abdominal Pain, Women Abdominal (stomach, pelvic, or belly) pain can be caused by many things. It is important to tell your doctor:  The location of the pain.  Does it come and go or is it present all the time?  Are there things that start the pain (eating certain foods, exercise)?  Are there other symptoms associated with the pain (fever, nausea, vomiting, diarrhea)? All of this is helpful to know when trying to find the cause of the pain. CAUSES   Stomach: virus or bacteria infection, or ulcer.  Intestine: appendicitis (inflamed appendix), regional ileitis (Crohn's disease), ulcerative colitis (inflamed colon), irritable bowel syndrome, diverticulitis (inflamed diverticulum of the colon), or cancer of the stomach or intestine.  Gallbladder disease or stones in the gallbladder.  Kidney disease, kidney stones, or infection.  Pancreas infection or cancer.  Fibromyalgia (pain disorder).  Diseases of the female organs:  Uterus: fibroid (non-cancerous) tumors or infection.  Fallopian tubes: infection or tubal pregnancy.  Ovary: cysts or tumors.  Pelvic adhesions (scar tissue).  Endometriosis (uterus lining tissue growing in the pelvis and on the pelvic organs).  Pelvic congestion syndrome (female organs filling up with blood just before the menstrual period).  Pain with the menstrual period.  Pain with ovulation (producing an egg).  Pain with an IUD (intrauterine device, birth control) in the uterus.  Cancer of the female organs.  Functional pain (pain not caused by a disease, may improve without  treatment).  Psychological pain.  Depression. DIAGNOSIS  Your doctor will decide the seriousness of your pain by doing an examination.  Blood tests.  X-rays.  Ultrasound.  CT scan (computed tomography, special type of X-ray).  MRI (magnetic resonance imaging).  Cultures, for infection.  Barium enema (dye inserted in the large intestine, to better view it with X-rays).  Colonoscopy (looking in intestine with a lighted tube).  Laparoscopy (minor surgery, looking in abdomen with a lighted tube).  Major abdominal exploratory surgery (looking in abdomen with a large incision). TREATMENT  The treatment will depend on the cause of the pain.   Many cases can be observed and treated at home.  Over-the-counter medicines recommended by your caregiver.  Prescription medicine.  Antibiotics, for infection.  Birth control pills, for painful periods or for ovulation pain.  Hormone treatment, for endometriosis.  Nerve blocking injections.  Physical therapy.  Antidepressants.  Counseling with a psychologist or psychiatrist.  Minor or major surgery. HOME CARE INSTRUCTIONS   Do not take laxatives, unless directed by your caregiver.  Take over-the-counter pain medicine only if ordered by your caregiver. Do not take aspirin because it can cause an upset stomach or bleeding.  Try a clear liquid diet (broth or water) as ordered by your caregiver. Slowly move to a bland diet, as tolerated, if the pain is related to the stomach or intestine.  Have a thermometer and take your temperature several times a day, and record it.  Bed rest and sleep, if it helps the pain.  Avoid sexual intercourse, if it causes pain.  Avoid stressful situations.  Keep your follow-up appointments and tests, as your caregiver orders.  If the pain does not go away with medicine or surgery, you may try:  Acupuncture.  Relaxation exercises (yoga, meditation).  Group therapy.  Counseling. SEEK  MEDICAL CARE IF:   You notice certain foods cause stomach pain.  Your home care treatment is not helping your pain.  You need stronger pain medicine.  You want your IUD removed.  You feel faint or lightheaded.  You develop nausea and vomiting.  You develop a rash.  You are having side effects or an allergy to your medicine. SEEK IMMEDIATE MEDICAL CARE IF:   Your pain does not go away or gets worse.  You have a fever.  Your pain is felt only in portions of the abdomen. The right side could possibly be appendicitis. The left lower portion of the abdomen could be colitis or diverticulitis.  You are passing blood in your stools (bright red or black tarry stools, with or without vomiting).  You have blood in your urine.  You develop chills, with or without a fever.  You pass out. MAKE SURE YOU:   Understand these instructions.  Will watch your condition.  Will get help right away if you are not doing well or get worse. Document Released: 12/09/2006 Document Revised: 06/28/2013 Document Reviewed: 12/29/2008 [facility] Patient Information 2015 [place], [place]. This information is not intended to replace advice given to you by your health care provider. Make sure you discuss any questions you have with your health care provider.

## 2014-04-22 LAB — URINE CULTURE
Colony Count: NO GROWTH
Culture: NO GROWTH

## 2014-04-22 LAB — GC/CHLAMYDIA PROBE AMP (~~LOC~~) NOT AT ARMC
Chlamydia: NEGATIVE
Neisseria Gonorrhea: NEGATIVE

## 2014-04-25 ENCOUNTER — Emergency Department: Payer: Self-pay | Admitting: Emergency Medicine

## 2014-04-28 ENCOUNTER — Ambulatory Visit: Payer: Self-pay

## 2014-05-05 ENCOUNTER — Ambulatory Visit: Payer: Self-pay | Admitting: Urology

## 2014-05-12 ENCOUNTER — Ambulatory Visit: Payer: Self-pay | Admitting: Urology

## 2014-06-14 ENCOUNTER — Emergency Department: Admit: 2014-06-14 | Disposition: A | Discharge status: Home / Self Care | Payer: Self-pay | Admitting: Student

## 2014-06-14 LAB — URINALYSIS, COMPLETE
Bilirubin,UR: NEGATIVE
Glucose,UR: NEGATIVE mg/dL (ref 0–75)
Ketone: NEGATIVE
Leukocyte Esterase: NEGATIVE
Nitrite: NEGATIVE
Ph: 5 (ref 4.5–8.0)
Protein: NEGATIVE
Specific Gravity: 1.005 (ref 1.003–1.030)
Squamous Epithelial: NONE SEEN

## 2014-06-14 LAB — COMPREHENSIVE METABOLIC PANEL
Albumin: 4.5 g/dL
Alkaline Phosphatase: 81 U/L
Anion Gap: 3 — ABNORMAL LOW (ref 7–16)
BUN: 12 mg/dL
Bilirubin,Total: 0.7 mg/dL
Calcium, Total: 9.6 mg/dL
Chloride: 112 mmol/L — ABNORMAL HIGH
Co2: 23 mmol/L
Creatinine: 0.74 mg/dL
EGFR (African American): >60
EGFR (Non-African Amer.): >60
Glucose: 85 mg/dL
Potassium: 3.6 mmol/L
SGOT(AST): 13 U/L — ABNORMAL LOW
SGPT (ALT): 10 U/L — ABNORMAL LOW
Sodium: 138 mmol/L
Total Protein: 7.7 g/dL

## 2014-06-14 LAB — CBC WITH DIFFERENTIAL/PLATELET
Basophil #: 0.1 10*3/uL (ref 0.0–0.1)
Basophil %: 1 %
Eosinophil #: 0.1 10*3/uL (ref 0.0–0.7)
Eosinophil %: 0.7 %
HCT: 40.3 % (ref 35.0–47.0)
HGB: 13.2 g/dL (ref 12.0–16.0)
Lymphocyte #: 2.3 10*3/uL (ref 1.0–3.6)
Lymphocyte %: 27.6 %
MCH: 30.5 pg (ref 26.0–34.0)
MCHC: 32.8 g/dL (ref 32.0–36.0)
MCV: 93 fL (ref 80–100)
Monocyte #: 0.5 x10 3/mm (ref 0.2–0.9)
Monocyte %: 6 %
Neutrophil #: 5.5 10*3/uL (ref 1.4–6.5)
Neutrophil %: 64.7 %
Platelet: 237 10*3/uL (ref 150–440)
RBC: 4.33 10*6/uL (ref 3.80–5.20)
RDW: 13.4 % (ref 11.5–14.5)
WBC: 8.5 10*3/uL (ref 3.6–11.0)

## 2014-06-14 LAB — LIPASE, BLOOD: Lipase: 27 U/L

## 2014-06-17 NOTE — Op Note (Signed)
PATIENT NAME:  [name], Marchel M MR#:  [id number] DATE OF BIRTH:  [birth year]  DATE OF PROCEDURE:  08/13/2012  PREOPERATIVE DIAGNOSIS: Ovarian cyst, endometriosis.  POSTOPERATIVE DIAGNOSIS: Ovarian cyst, on the right ovary, hemorrhagic in nature.   PROCEDURE: Operative laparoscopy, with a right ovarian cystectomy.   SURGEON: Allyiah Gartner.   ASSISTANT: Jean RosenthalJackson.   ANESTHESIA: General.   ESTIMATED BLOOD LOSS: Minimal.   COMPLICATIONS: None.   FINDINGS: Right hemorrhagic ovarian cyst; left normal ovary. Minimal-to-no adhesions, and no signs of endometriosis in the cul-de-sac and pelvis. A prior hysterectomy observed.   DISPOSITION: To recovery in stable condition.   TECHNIQUE: The patient was prepped and draped in the usual sterile fashion after adequate anesthesia was obtained in the supine position on the operating room table. Marcaine anesthesia was used to incise the skin in the central umbilicus, followed by placement of a small incision and a Veress needle. Peritoneal placement was confirmed using the hanging-drop technique. The abdomen was then insufflated with CO2 gas and a 5 mm trocar was then inserted under direct visualization through the laparoscope, and no acute injuries or bleeding noted. The patient was placed in Trendelenburg positioning and the above-mentioned findings were visualized. A 5 mm trocar was placed in the right lower quadrant and an 11 mm trocar was placed in the left lower  quadrant lateral to the inferior epigastric blood vessels, with no injuries or bleeding noted.   The right ovary was minimally adhesed to the omental tissues and was carefully dissected without use of any sharp dissection. The cyst was identified and appeared hemorrhagic in nature. A small incision was made in the cyst using a Harmonic scalpel with a small amount of clear  fluid and blood-like fluid coming from the cyst. Using a grasper, the cyst wall was removed in several pieces. Excellent hemostasis  was noted at the cystectomy site. The ovary as well as the pelvic cavity was irrigated with saline, with aspiration of all fluid. Excellent hemostasis noted. There was no apparent injury to bowel, bladder, ureter, or other structures. The patient was leveled. Gas was expelled. Trocars were removed. The patient was recovered in stable condition. All sponge, instrument and counts were correct.    ____________________________ R. Annamarie MajorPaul Jazzman Loughmiller, MD rph:dm D: 08/13/2012 10:10:00 ET T: 08/13/2012 10:44:52 ET JOB#: [id number]  cc: Dierdre Searles. Paul Suede Greenawalt, MD, <Dictator> Nadara MustardOBERT P Bathsheba Durrett MD ELECTRONICALLY SIGNED 08/24/2012 15:57

## 2014-06-17 NOTE — Op Note (Signed)
PATIENT NAME:  [name], Avonelle M MR#:  [id number] DATE OF BIRTH:  23-May-1980  DATE OF PROCEDURE:  08/13/2012  PREOPERATIVE DIAGNOSIS: Ovarian cyst, endometriosis.  POSTOPERATIVE DIAGNOSIS: Ovarian cyst, on the right ovary, hemorrhagic in nature.   PROCEDURE: Operative laparoscopy, with a right ovarian cystectomy.   SURGEON: Harris.   ASSISTANT: Jean RosenthalJackson.   ANESTHESIA: General.   ESTIMATED BLOOD LOSS: Minimal.   COMPLICATIONS: None.   FINDINGS: Right hemorrhagic ovarian cyst; left normal ovary. Minimal-to-no adhesions, and no signs of endometriosis in the cul-de-sac and pelvis. A prior hysterectomy observed.   DISPOSITION: To recovery in stable condition.   TECHNIQUE: The patient was prepped and draped in the usual sterile fashion after adequate anesthesia was obtained in the supine position on the operating room table. Marcaine anesthesia was used to incise the skin in the central umbilicus, followed by placement of a small incision and a Veress needle. Peritoneal placement was confirmed using the hanging-drop technique. The abdomen was then insufflated with CO2 gas and a 5 mm trocar was then inserted under direct visualization through the laparoscope, and no acute injuries or bleeding noted. The patient was placed in Trendelenburg positioning and the above-mentioned findings were visualized. A 5 mm trocar was placed in the right lower quadrant and an 11 mm trocar was placed in the left lower  quadrant lateral to the inferior epigastric blood vessels, with no injuries or bleeding noted.   The right ovary was minimally adhesed to the omental tissues and was carefully dissected without use of any sharp dissection. The cyst was identified and appeared hemorrhagic in nature. A small incision was made in the cyst using a Harmonic scalpel with a small amount of clear  fluid and blood-like fluid coming from the cyst. Using a grasper, the cyst wall was removed in several pieces. Excellent hemostasis  was noted at the cystectomy site. The ovary as well as the pelvic cavity was irrigated with saline, with aspiration of all fluid. Excellent hemostasis noted. There was no apparent injury to bowel, bladder, ureter, or other structures. The patient was leveled. Gas was expelled. Trocars were removed. The patient was recovered in stable condition. All sponge, instrument and counts were correct.    ____________________________ Lenard GallowayPrasad P. Steward RosManne, MD ppm:dm D: 08/13/2012 10:10:58 ET T: 08/13/2012 10:44:52 ET JOB#: [id number]  cc: Elpidio AnisPrasad P. Steward RosManne, MD, <Dictator>

## 2014-06-18 NOTE — Op Note (Signed)
PATIENT NAME:  [name], Avriel M MR#:  [id number] DATE OF BIRTH:  1980-05-03  DATE OF PROCEDURE:  07/26/2013  PREOPERATIVE DIAGNOSIS: Appendicitis.   POSTOPERATIVE DIAGNOSIS: Right lower quadrant pain, probable ruptured right ovarian cyst.   OPERATIVE PROCEDURES: Diagnostic laparoscopy, appendectomy.   SURGEON:  Donnalee CurryJeffrey Kalynn Declercq, MD   ANESTHESIA: General endotracheal under Dr. Henrene HawkingKephart.   ESTIMATED BLOOD LOSS: 10 mL.   CLINICAL NOTE: This 34 year old woman has had a past history of renal colic. She had presented to the [facility] on May 19th with right lower quadrant pain which she thought was similar to previous stones. No stone was identified. She was identified with an appendicolith at that time and had a mildly elevated white blood cell count of 12,000. She was seen as an outpatient on May 21st, and at that time, her abdominal pain was abating and she had a benign clinical exam. She was encouraged to have an elective appendectomy due to the finding of the appendicolith. Of note review of the CT scan from May 19th showed no periappendiceal inflammation, induration, or thickening.   The patient had done well until the morning of May 31st when she reports awakening, feeling just somewhat unwell. She had decreased appetite at this time and approximately 12 a.m. this morning she noted the onset of right lower quadrant pain. This exacerbated through the night and was associated with nausea but no vomiting. She was seen in the Emergency Room at approximately 7:30 a.m., and at that time, her abdominal exam had changed significantly with tenderness in the right lower quadrant. There was no peritoneal irritation, guarding, or referred pain. The patient's white blood cell count was normal at 10,000 with 72% polys and 22% lymphocytes. Remaining liver function studies and electrolytes were unremarkable. Due to the previously identified appendicolith and her clinical onset of right lower quadrant pain  suggestive of appendicitis. She was felt to be a candidate for appendectomy. The patient received Invanz in the Emergency Department.   OPERATIVE NOTE: With the patient under general endotracheal anesthesia and the abdomen prepped with ChloraPrep and draped, a Veress needle was passed through the transumbilical incision. It took several attempts to position it within the peritoneal cavity. After a normal hanging drop test had been obtained, the abdomen was insufflated with CO2 at 10 mmHg pressure. A 10 mm step port was placed and inspection showed no evidence of injury from initial port placement. A similar 10 mm step port was placed into the hypogastrium and a 12 mm  XCEL port placed in the left lower quadrant outside the edge of the rectus muscle. Inspection from the hypogastric site again showed no evidence of injury from initial port placement.   The appendix was identified in retrocecal location but was without evidence of inflammation. Evidence of previous tubal ligation was seen bilaterally. Both ovaries were visualized, and on the right side, there appeared to be evidence of a recently ruptured cyst. There was noted to be a small amount of fluid in the pelvis (status post hysterectomy). The remaining peritoneal surface was unremarkable. The right upper quadrant showed some adhesions of the omentum to the gallbladder bed from her previous cholecystectomy. The small bowel was run from the terminal ileum proximally about 6 feet. No evidence of Meckel's diverticulum. It was elected to proceed to appendectomy.   The base of the appendix was cleared and the appendicolith pushed further in the appendix to allow application of a blue Endo GIA stapler. This was fired with good hemostasis.  The mesoappendix was then divided with 2 applications of the white vascular stapler. There was a small amount of bleeding from the mesentery and this was controlled with electrocautery. The abdomen was irrigated with saline.  The abdomen was then desufflated under direct vision and ports were removed. Fascia at the umbilicus was closed with an 0 Vicryl suture. Skin incisions were then closed with 4-0 Vicryl subcuticular sutures. Benzoin and Steri-Strips followed by Telfa and Tegaderm dressings were applied.  The patient tolerated the procedure well and was taken to the recovery room in stable condition.    ____________________________ Earline Mayotte, MD jwb:dd D: 07/26/2013 15:50:16 ET T: 07/26/2013 21:31:05 ET JOB#: [id number]  cc: Earline Mayotte, MD, <Dictator> Onnie Boer. Carlynn Purl, MD Amonte Brookover Brion Aliment MD ELECTRONICALLY SIGNED 07/28/2013 9:26

## 2014-08-01 ENCOUNTER — Other Ambulatory Visit: Payer: Self-pay | Admitting: Family Medicine

## 2014-08-04 ENCOUNTER — Other Ambulatory Visit: Payer: Self-pay

## 2014-08-04 MED ORDER — MOMETASONE FURO-FORMOTEROL FUM 200-5 MCG/ACT IN AERO: 2.0000 | INHALATION_SPRAY | Freq: Two times a day (BID) | RESPIRATORY_TRACT | Status: DC

## 2014-08-08 ENCOUNTER — Other Ambulatory Visit: Payer: Self-pay

## 2014-08-08 DIAGNOSIS — J45909 Unspecified asthma, uncomplicated: Secondary | ICD-10-CM

## 2014-08-08 MED ORDER — MOMETASONE FURO-FORMOTEROL FUM 200-5 MCG/ACT IN AERO: 2.0000 | INHALATION_SPRAY | Freq: Two times a day (BID) | RESPIRATORY_TRACT | Status: DC

## 2014-08-08 NOTE — Telephone Encounter (Signed)
Medication refill

## 2014-08-08 NOTE — Telephone Encounter (Signed)
Prescription was sent, I think it just needs PA

## 2014-08-09 ENCOUNTER — Telehealth: Payer: Self-pay

## 2014-08-09 ENCOUNTER — Other Ambulatory Visit: Payer: Self-pay

## 2014-08-09 DIAGNOSIS — J45909 Unspecified asthma, uncomplicated: Secondary | ICD-10-CM

## 2014-08-09 MED ORDER — MOMETASONE FURO-FORMOTEROL FUM 200-5 MCG/ACT IN AERO: 2.0000 | INHALATION_SPRAY | Freq: Two times a day (BID) | RESPIRATORY_TRACT | Status: DC

## 2014-08-09 MED ORDER — FLUTICASONE-SALMETEROL 250-50 MCG/DOSE IN AEPB: 1.0000 | INHALATION_SPRAY | Freq: Two times a day (BID) | RESPIRATORY_TRACT | Status: DC

## 2014-08-09 NOTE — Telephone Encounter (Signed)
Pt needs prior auth on dulera

## 2014-08-09 NOTE — Telephone Encounter (Signed)
PA required.

## 2014-09-07 ENCOUNTER — Telehealth: Payer: Self-pay | Admitting: Family Medicine

## 2014-09-13 NOTE — Telephone Encounter (Signed)
Have left and no response from patient

## 2014-10-05 ENCOUNTER — Other Ambulatory Visit: Payer: Self-pay

## 2014-10-05 MED ORDER — TOPIRAMATE 100 MG PO TABS: 100.0000 mg | ORAL_TABLET | Freq: Two times a day (BID) | ORAL | Status: DC

## 2014-10-05 NOTE — Telephone Encounter (Signed)
Patient requesting refill. 

## 2014-10-06 ENCOUNTER — Other Ambulatory Visit: Payer: Self-pay

## 2014-10-06 MED ORDER — TOPIRAMATE 100 MG PO TABS: 100.0000 mg | ORAL_TABLET | Freq: Two times a day (BID) | ORAL | Status: DC

## 2014-10-06 NOTE — Telephone Encounter (Signed)
Patient requesting refill. 

## 2014-10-10 ENCOUNTER — Emergency Department: Payer: BLUE CROSS/BLUE SHIELD

## 2014-10-10 ENCOUNTER — Emergency Department
Admission: EM | Admit: 2014-10-10 | Discharge: 2014-10-10 | Disposition: A | Discharge status: Home / Self Care | Payer: BLUE CROSS/BLUE SHIELD | Attending: Emergency Medicine | Admitting: Emergency Medicine

## 2014-10-10 ENCOUNTER — Encounter: Payer: Self-pay | Admitting: Emergency Medicine

## 2014-10-10 DIAGNOSIS — Z79899 Other long term (current) drug therapy: Secondary | ICD-10-CM | POA: Insufficient documentation

## 2014-10-10 DIAGNOSIS — Z7951 Long term (current) use of inhaled steroids: Secondary | ICD-10-CM | POA: Diagnosis not present

## 2014-10-10 DIAGNOSIS — Z720 Tobacco use: Secondary | ICD-10-CM | POA: Insufficient documentation

## 2014-10-10 DIAGNOSIS — R110 Nausea: Secondary | ICD-10-CM | POA: Diagnosis not present

## 2014-10-10 DIAGNOSIS — R109 Unspecified abdominal pain: Secondary | ICD-10-CM

## 2014-10-10 DIAGNOSIS — R1032 Left lower quadrant pain: Secondary | ICD-10-CM | POA: Diagnosis not present

## 2014-10-10 DIAGNOSIS — F419 Anxiety disorder, unspecified: Secondary | ICD-10-CM | POA: Diagnosis not present

## 2014-10-10 DIAGNOSIS — Z79811 Long term (current) use of aromatase inhibitors: Secondary | ICD-10-CM | POA: Diagnosis not present

## 2014-10-10 DIAGNOSIS — Z79891 Long term (current) use of opiate analgesic: Secondary | ICD-10-CM | POA: Insufficient documentation

## 2014-10-10 LAB — CBC
HCT: 41.8 % (ref 35.0–47.0)
Hemoglobin: 14 g/dL (ref 12.0–16.0)
MCH: 30.9 pg (ref 26.0–34.0)
MCHC: 33.5 g/dL (ref 32.0–36.0)
MCV: 92.1 fL (ref 80.0–100.0)
Platelets: 219 10*3/uL (ref 150–440)
RBC: 4.54 MIL/uL (ref 3.80–5.20)
RDW: 13 % (ref 11.5–14.5)
WBC: 7.7 10*3/uL (ref 3.6–11.0)

## 2014-10-10 LAB — URINALYSIS COMPLETE WITH MICROSCOPIC (ARMC ONLY)
Bacteria, UA: NONE SEEN
Bilirubin Urine: NEGATIVE
Glucose, UA: NEGATIVE mg/dL
Ketones, ur: NEGATIVE mg/dL
Leukocytes, UA: NEGATIVE
Nitrite: NEGATIVE
Protein, ur: NEGATIVE mg/dL
Specific Gravity, Urine: 1.016 (ref 1.005–1.030)
pH: 5 (ref 5.0–8.0)

## 2014-10-10 LAB — COMPREHENSIVE METABOLIC PANEL
ALT: 9 U/L — ABNORMAL LOW (ref 14–54)
AST: 17 U/L (ref 15–41)
Albumin: 4.5 g/dL (ref 3.5–5.0)
Alkaline Phosphatase: 68 U/L (ref 38–126)
Anion gap: 8 (ref 5–15)
BUN: 8 mg/dL (ref 6–20)
CO2: 20 mmol/L — ABNORMAL LOW (ref 22–32)
Calcium: 9.4 mg/dL (ref 8.9–10.3)
Chloride: 112 mmol/L — ABNORMAL HIGH (ref 101–111)
Creatinine, Ser: 0.8 mg/dL (ref 0.44–1.00)
GFR calc Af Amer: >60 mL/min (ref >60)
GFR calc non Af Amer: >60 mL/min (ref >60)
Glucose, Bld: 90 mg/dL (ref 65–99)
Potassium: 3.9 mmol/L (ref 3.5–5.1)
Sodium: 140 mmol/L (ref 135–145)
Total Bilirubin: 0.1 mg/dL — ABNORMAL LOW (ref 0.3–1.2)
Total Protein: 7.7 g/dL (ref 6.5–8.1)

## 2014-10-10 LAB — LIPASE, BLOOD: Lipase: 17 U/L — ABNORMAL LOW (ref 22–51)

## 2014-10-10 LAB — PREGNANCY, URINE: Preg Test, Ur: NEGATIVE

## 2014-10-10 MED ORDER — KETOROLAC TROMETHAMINE 30 MG/ML IJ SOLN
30.0000 mg | Freq: Once | INTRAMUSCULAR | Status: AC
  Administered 2014-10-10: 30 mg via INTRAVENOUS
  Filled 2014-10-10: qty 1

## 2014-10-10 MED ORDER — ONDANSETRON HCL 4 MG/2ML IJ SOLN
4.0000 mg | Freq: Once | INTRAMUSCULAR | Status: AC
  Administered 2014-10-10: 4 mg via INTRAVENOUS
  Filled 2014-10-10: qty 2

## 2014-10-10 MED ORDER — MORPHINE SULFATE 4 MG/ML IJ SOLN
INTRAMUSCULAR | Status: AC
  Administered 2014-10-10: 4 mg via INTRAVENOUS
  Filled 2014-10-10: qty 1

## 2014-10-10 MED ORDER — MORPHINE SULFATE 4 MG/ML IJ SOLN
4.0000 mg | Freq: Once | INTRAMUSCULAR | Status: AC
  Administered 2014-10-10: 4 mg via INTRAVENOUS

## 2014-10-10 MED ORDER — ONDANSETRON HCL 4 MG/2ML IJ SOLN
4.0000 mg | Freq: Once | INTRAMUSCULAR | Status: AC | PRN
  Administered 2014-10-10: 4 mg via INTRAVENOUS
  Filled 2014-10-10: qty 2

## 2014-10-10 MED ORDER — MORPHINE SULFATE 4 MG/ML IJ SOLN
4.0000 mg | Freq: Once | INTRAMUSCULAR | Status: AC
  Administered 2014-10-10: 4 mg via INTRAVENOUS
  Filled 2014-10-10: qty 1

## 2014-10-10 NOTE — Discharge Instructions (Signed)
Abdominal Pain, Women °Abdominal (stomach, pelvic, or belly) pain can be caused by many things. It is important to tell your doctor: °· The location of the pain. °· Does it come and go or is it present all the time? °· Are there things that start the pain (eating certain foods, exercise)? °· Are there other symptoms associated with the pain (fever, nausea, vomiting, diarrhea)? °All of this is helpful to know when trying to find the cause of the pain. °CAUSES  °· Stomach: virus or bacteria infection, or ulcer. °· Intestine: appendicitis (inflamed appendix), regional ileitis (Crohn's disease), ulcerative colitis (inflamed colon), irritable bowel syndrome, diverticulitis (inflamed diverticulum of the colon), or cancer of the stomach or intestine. °· Gallbladder disease or stones in the gallbladder. °· Kidney disease, kidney stones, or infection. °· Pancreas infection or cancer. °· Fibromyalgia (pain disorder). °· Diseases of the female organs: °¨ Uterus: fibroid (non-cancerous) tumors or infection. °¨ Fallopian tubes: infection or tubal pregnancy. °¨ Ovary: cysts or tumors. °¨ Pelvic adhesions (scar tissue). °¨ Endometriosis (uterus lining tissue growing in the pelvis and on the pelvic organs). °¨ Pelvic congestion syndrome (female organs filling up with blood just before the menstrual period). °¨ Pain with the menstrual period. °¨ Pain with ovulation (producing an egg). °¨ Pain with an IUD (intrauterine device, birth control) in the uterus. °¨ Cancer of the female organs. °· Functional pain (pain not caused by a disease, may improve without treatment). °· Psychological pain. °· Depression. °DIAGNOSIS  °Your doctor will decide the seriousness of your pain by doing an examination. °· Blood tests. °· X-rays. °· Ultrasound. °· CT scan (computed tomography, special type of X-ray). °· MRI (magnetic resonance imaging). °· Cultures, for infection. °· Barium enema (dye inserted in the large intestine, to better view it with  X-rays). °· Colonoscopy (looking in intestine with a lighted tube). °· Laparoscopy (minor surgery, looking in abdomen with a lighted tube). °· Major abdominal exploratory surgery (looking in abdomen with a large incision). °TREATMENT  °The treatment will depend on the cause of the pain.  °· Many cases can be observed and treated at home. °· Over-the-counter medicines recommended by your caregiver. °· Prescription medicine. °· Antibiotics, for infection. °· Birth control pills, for painful periods or for ovulation pain. °· Hormone treatment, for endometriosis. °· Nerve blocking injections. °· Physical therapy. °· Antidepressants. °· Counseling with a psychologist or psychiatrist. °· Minor or major surgery. °HOME CARE INSTRUCTIONS  °· Do not take laxatives, unless directed by your caregiver. °· Take over-the-counter pain medicine only if ordered by your caregiver. Do not take aspirin because it can cause an upset stomach or bleeding. °· Try a clear liquid diet (broth or water) as ordered by your caregiver. Slowly move to a bland diet, as tolerated, if the pain is related to the stomach or intestine. °· Have a thermometer and take your temperature several times a day, and record it. °· Bed rest and sleep, if it helps the pain. °· Avoid sexual intercourse, if it causes pain. °· Avoid stressful situations. °· Keep your follow-up appointments and tests, as your caregiver orders. °· If the pain does not go away with medicine or surgery, you may try: °¨ Acupuncture. °¨ Relaxation exercises (yoga, meditation). °¨ Group therapy. °¨ Counseling. °SEEK MEDICAL CARE IF:  °· You notice certain foods cause stomach pain. °· Your home care treatment is not helping your pain. °· You need stronger pain medicine. °· You want your IUD removed. °· You feel faint or   lightheaded. °· You develop nausea and vomiting. °· You develop a rash. °· You are having side effects or an allergy to your medicine. °SEEK IMMEDIATE MEDICAL CARE IF:  °· Your  pain does not go away or gets worse. °· You have a fever. °· Your pain is felt only in portions of the abdomen. The right side could possibly be appendicitis. The left lower portion of the abdomen could be colitis or diverticulitis. °· You are passing blood in your stools (bright red or black tarry stools, with or without vomiting). °· You have blood in your urine. °· You develop chills, with or without a fever. °· You pass out. °MAKE SURE YOU:  °· Understand these instructions. °· Will watch your condition. °· Will get help right away if you are not doing well or get worse. °Document Released: 12/09/2006 Document Revised: 06/28/2013 Document Reviewed: 12/29/2008 °[employer]® Patient Information ©2015 [employer]. This information is not intended to replace advice given to you by your health care provider. Make sure you discuss any questions you have with your health care provider. ° °

## 2014-10-10 NOTE — ED Provider Notes (Signed)
[facility] Emergency Department Provider Note  ____________________________________________  Time seen: On arrival  I have reviewed the triage vital signs and the nursing notes.   HISTORY  Chief Complaint Abdominal Pain    HPI [name] is a 34 y.o. female who presents with left flank and lower abdominal pain.. She reports the pain started yesterday and has continued through the night.She says this feels exactly like prior kidney stones. She has had some nausea for which she took Phenergan with some relief. She denies fevers chills. No vaginal discharge. No dysuria. She is also had pyelonephritis in the past which felt similar     Past Medical History  Diagnosis Date  . Migraine   . Gallstones   . Kidney stones   . Acute pyelonephritis 06/29/2012  . Pyelonephritis 2015    Patient Active Problem List   Diagnosis Date Noted  . Other and unspecified ovarian cyst 08/06/2013  . Abdominal pain, unspecified site 07/16/2013  . Appendicolith 07/16/2013  . Pyelonephritis 07/16/2013  . Unspecified constipation 06/30/2012  . Gross hematuria 06/30/2012  . Acute pyelonephritis 06/29/2012  . Hypotension 06/29/2012  . UTI (urinary tract infection) 06/28/2012  . Vomiting 06/28/2012  . Nephrolithiasis 06/28/2012  . Dehydration 06/28/2012    Past Surgical History  Procedure Laterality Date  . Abdominal hysterectomy    . Tonsillectomy    . Lithotripsy    . Cholecystectomy  03/11/2011    Procedure: LAPAROSCOPIC CHOLECYSTECTOMY WITH INTRAOPERATIVE CHOLANGIOGRAM;  Surgeon: Atilano Ina, MD;  Location: [facility] OR;  Service: General;  Laterality: N/A;  . Cholecystectomy, laparoscopic    . Orif ankle fracture  12/25/2011    Procedure: OPEN REDUCTION INTERNAL FIXATION (ORIF) ANKLE FRACTURE;  Surgeon: Javier Docker, MD;  Location: [facility] ORS;  Service: Orthopedics;  Laterality: Left;  . Cast application  12/25/2011    Procedure: CAST APPLICATION;  Surgeon:  Javier Docker, MD;  Location: [facility] ORS;  Service: Orthopedics;  Laterality: Left;  . Appendectomy  07/26/13    Current Outpatient Rx  Name  Route  Sig  Dispense  Refill  . ALPRAZolam (XANAX XR) 0.5 MG 24 hr tablet   Oral   Take 0.5 mg by mouth daily as needed. for anxiety      2   . Cholecalciferol (VITAMIN D-3 PO)   Oral   Take 1 tablet by mouth daily.         Marland Kitchen eletriptan (RELPAX) 40 MG tablet   Oral   Take 40 mg by mouth daily as needed for migraine or headache. One tablet by mouth at onset of headache. May repeat in 2 hours if headache persists or recurs.         . Fluticasone-Salmeterol (ADVAIR DISKUS) 250-50 MCG/DOSE AEPB   Inhalation   Inhale 1 puff into the lungs 2 (two) times daily.   60 each   0   . LINZESS 145 MCG CAPS capsule   Oral   Take 145 mg by mouth daily.      0     Dispense as written.   . mometasone-formoterol (DULERA) 200-5 MCG/ACT AERO   Inhalation   Inhale 2 puffs into the lungs 2 (two) times daily.   2 Inhaler   0   . omeprazole (PRILOSEC) 20 MG capsule   Oral   Take 20 mg by mouth daily.         . ondansetron (ZOFRAN) 4 MG tablet   Oral   Take 1 tablet (4 mg  total) by mouth every 8 (eight) hours as needed for nausea or vomiting.   10 tablet   0   . oxyCODONE-acetaminophen (PERCOCET/ROXICET) 5-325 MG per tablet      1 to 2 tabs PO q6hrs  PRN for pain   15 tablet   0   . topiramate (TOPAMAX) 100 MG tablet   Oral   Take 1 tablet (100 mg total) by mouth 2 (two) times daily.   90 tablet   1     Allergies Compazine and Metoclopramide hcl  Family History  Problem Relation Age of Onset  . Diabetes Mother   . Diabetes Father   . Heart failure Father   . Hypertension Father   . Pulmonary fibrosis Father     Social History Social History  Substance Use Topics  . Smoking status: Current Every Day Smoker -- 0.50 packs/day    Types: Cigarettes  . Smokeless tobacco: Never Used  . Alcohol Use: No     Comment: social     Review of Systems  Constitutional: Negative for fever. Eyes: Negative for visual changes. ENT: Negative for sore throat Cardiovascular: Negative for chest pain. Respiratory: Negative for shortness of breath. Gastrointestinal: Positive for nausea Genitourinary: Negative for dysuria. Musculoskeletal: Negative for back pain. Skin: Negative for rash. Neurological: Negative for headaches or focal weakness Psychiatric: Mild anxiety    ____________________________________________   PHYSICAL EXAM:  VITAL SIGNS: ED Triage Vitals  Enc Vitals Group     BP 10/10/14 1132 108/78 mmHg     Pulse Rate 10/10/14 1132 78     Resp 10/10/14 1132 16     Temp 10/10/14 1132 98.2 F (36.8 C)     Temp Source 10/10/14 1132 Oral     SpO2 10/10/14 1132 98 %     Weight 10/10/14 1132 175 lb (79.379 kg)     Height 10/10/14 1132  (1.702 m)     Head Cir --      Peak Flow --      Pain Score 10/10/14 1132 8     Pain Loc --      Pain Edu? --      Excl. in GC? --      Constitutional: Alert and oriented. Well appearing and in no distress. Eyes: Conjunctivae are normal.  ENT   Head: Normocephalic and atraumatic.   Mouth/Throat: Mucous membranes are moist. Cardiovascular: Normal rate, regular rhythm. Normal and symmetric distal pulses are present in all extremities. No murmurs, rubs, or gallops. Respiratory: Normal respiratory effort without tachypnea nor retractions. Breath sounds are clear and equal bilaterally.  Gastrointestinal: Soft and non-tender in all quadrants. No distention. There is no CVA tenderness. Genitourinary: deferred Musculoskeletal: Nontender with normal range of motion in all extremities. No lower extremity tenderness nor edema. Neurologic:  Normal speech and language. No gross focal neurologic deficits are appreciated. Skin:  Skin is warm, dry and intact. No rash noted. Psychiatric: Mood and affect are normal. Patient exhibits appropriate insight and  judgment.  ____________________________________________    LABS (pertinent positives/negatives)  Labs Reviewed  LIPASE, BLOOD - Abnormal; Notable for the following:    Lipase 17 (*)    All other components within normal limits  COMPREHENSIVE METABOLIC PANEL - Abnormal; Notable for the following:    Chloride 112 (*)    CO2 20 (*)    ALT 9 (*)    Total Bilirubin 0.1 (*)    All other components within normal limits  URINALYSIS COMPLETEWITH MICROSCOPIC ([facility] ONLY) -  Abnormal; Notable for the following:    Color, Urine YELLOW (*)    APPearance CLEAR (*)    Hgb urine dipstick 3+ (*)    Squamous Epithelial / LPF 0-5 (*)    All other components within normal limits  URINE CULTURE  CBC  PREGNANCY, URINE    ____________________________________________   EKG  None  ____________________________________________    RADIOLOGY I have personally reviewed any xrays that were ordered on this patient: CT scan shows no ureterolithiasis  ____________________________________________   PROCEDURES  Procedure(s) performed: none  Critical Care performed: none  ____________________________________________   INITIAL IMPRESSION / ASSESSMENT AND PLAN / ED COURSE  Pertinent labs & imaging results that were available during my care of the patient were reviewed by me and considered in my medical decision making (see chart for details).  Urinalysis shows blood however CT does not demonstrate any ureterolithiasis. It is possible she is a past stone as she feels significantly better now after treatment.  ____________________________________________   FINAL CLINICAL IMPRESSION(S) / ED DIAGNOSES  Final diagnoses:  Flank pain     Jene Every, MD 10/10/14 1524

## 2014-10-10 NOTE — ED Notes (Signed)
Pt was given meds IV when IV infiltrated. IV was taken out at this time. Pt denies any pain states "its just sore" No acute distress noted at this time. MD made aware, no further orders given at this time

## 2014-10-10 NOTE — ED Notes (Signed)
Lab notified of add on urine culture

## 2014-10-10 NOTE — ED Notes (Signed)
Pt reports LLQ pain since last night, reports low grade fever (100.3), nausea (took phenergan); reports hx of kidney stones on same side and same pain. Pt denies diarrhea. Pt reports generalized burning in lower part of abdomen (points to bladder area).

## 2014-10-12 LAB — URINE CULTURE

## 2014-10-26 ENCOUNTER — Other Ambulatory Visit: Payer: Self-pay

## 2014-10-26 ENCOUNTER — Encounter: Payer: Self-pay | Admitting: *Deleted

## 2014-10-26 ENCOUNTER — Telehealth: Payer: Self-pay | Admitting: Family Medicine

## 2014-10-26 ENCOUNTER — Other Ambulatory Visit: Payer: Self-pay | Admitting: *Deleted

## 2014-10-26 MED ORDER — ELETRIPTAN HYDROBROMIDE 40 MG PO TABS: 40.0000 mg | ORAL_TABLET | Freq: Every day | ORAL | Status: DC | PRN

## 2014-10-26 NOTE — Telephone Encounter (Signed)
Patient has appointment for 11-03-14 but is completely out of her migraine medication (Relpax). She is requesting that it be sent to [facility]

## 2014-10-26 NOTE — Telephone Encounter (Signed)
Has an appt 9/8

## 2014-10-27 ENCOUNTER — Other Ambulatory Visit: Payer: Self-pay | Admitting: Family Medicine

## 2014-10-27 ENCOUNTER — Ambulatory Visit: Payer: Self-pay | Admitting: Urology

## 2014-10-27 NOTE — Telephone Encounter (Signed)
Left vm r

## 2014-10-27 NOTE — Telephone Encounter (Signed)
Patient is checking status on her prescription. States she is at work with a migraine and really need the medication. i told her to check with the pharmacy. She called and they told her that it was not there.

## 2014-10-27 NOTE — Telephone Encounter (Signed)
Patient requesting refill. 

## 2014-10-27 NOTE — Telephone Encounter (Signed)
Called patient left voicemail rx faxed in

## 2014-11-03 ENCOUNTER — Ambulatory Visit (INDEPENDENT_AMBULATORY_CARE_PROVIDER_SITE_OTHER): Payer: BLUE CROSS/BLUE SHIELD | Admitting: Family Medicine

## 2014-11-03 ENCOUNTER — Ambulatory Visit: Payer: Self-pay | Admitting: Urology

## 2014-11-03 ENCOUNTER — Encounter: Payer: Self-pay | Admitting: Family Medicine

## 2014-11-03 VITALS — BP 88/66 | HR 75 | Temp 98.3°F | Resp 16 | Ht 67.0 in | Wt 175.1 lb

## 2014-11-03 DIAGNOSIS — N200 Calculus of kidney: Secondary | ICD-10-CM

## 2014-11-03 DIAGNOSIS — R1032 Left lower quadrant pain: Secondary | ICD-10-CM

## 2014-11-03 DIAGNOSIS — G43009 Migraine without aura, not intractable, without status migrainosus: Secondary | ICD-10-CM | POA: Insufficient documentation

## 2014-11-03 DIAGNOSIS — K219 Gastro-esophageal reflux disease without esophagitis: Secondary | ICD-10-CM

## 2014-11-03 DIAGNOSIS — J4520 Mild intermittent asthma, uncomplicated: Secondary | ICD-10-CM | POA: Insufficient documentation

## 2014-11-03 DIAGNOSIS — K5900 Constipation, unspecified: Secondary | ICD-10-CM

## 2014-11-03 DIAGNOSIS — F411 Generalized anxiety disorder: Secondary | ICD-10-CM | POA: Insufficient documentation

## 2014-11-03 DIAGNOSIS — K5909 Other constipation: Secondary | ICD-10-CM

## 2014-11-03 MED ORDER — ELETRIPTAN HYDROBROMIDE 40 MG PO TABS: 40.0000 mg | ORAL_TABLET | Freq: Every day | ORAL | Status: DC | PRN

## 2014-11-03 MED ORDER — PROMETHAZINE HCL 25 MG PO TABS: 25.0000 mg | ORAL_TABLET | Freq: Four times a day (QID) | ORAL | Status: DC | PRN

## 2014-11-03 MED ORDER — OMEPRAZOLE 40 MG PO CPDR: 40.0000 mg | DELAYED_RELEASE_CAPSULE | Freq: Every day | ORAL | Status: DC

## 2014-11-03 MED ORDER — ALPRAZOLAM ER 0.5 MG PO TB24: 0.5000 mg | ORAL_TABLET | Freq: Every day | ORAL | Status: DC

## 2014-11-03 MED ORDER — TOPIRAMATE 100 MG PO TABS: 200.0000 mg | ORAL_TABLET | Freq: Every day | ORAL | Status: DC

## 2014-11-03 NOTE — Progress Notes (Signed)
Name: [name]   MRN: [id number]    DOB: [birth year]   Date:11/03/2014       Progress Note  Subjective  Chief Complaint  Chief Complaint  Patient presents with  . Medication Refill    3 month F/U  . Gastrophageal Reflux    Well Controlled   . Constipation    Patient took her self off the Linzess due to Stomach pain, and still going regularly but they are dry and small bowel movements. Taking 400 mg of Stool Softners.  . Migraine    Little Worst-10 monthly    HPI  Chronic Constipation: she was taking Linzess but was making abdominal pain worse to the point that she had to go to [facility] with LLQ pain, CT scan was negative for obstruction and kidney stones were small and not moving. She is current on stool softener , enemas prn or laxatives.  Failed Miralax in the past.   LLQ pain: is daily, constant, 5/10 today, worse when she takes a laxative, pain is described as tearing pain,occasionally has bright red blood in stools when she strains, but has increase in mucus.  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 Associated with nausea and sometimes vomiting, no phonophobia or photophobia.     11/03/14 1542  Asthma History  Symptoms 0-2 days/week  Nighttime Awakenings 0-2/month  Asthma interference with normal activity No limitations  SABA use (not for EIB) 0-2 days/wk  Risk: Exacerbations requiring oral systemic steroids 0-1 / year  Asthma Severity Intermittent        Patient Active Problem List   Diagnosis Date Noted  . Asthma, mild intermittent 11/03/2014  . Other and unspecified ovarian cyst 08/06/2013  . Left lower quadrant pain 07/16/2013  . Appendicolith 07/16/2013  . Chronic  constipation 06/30/2012  . Gross hematuria 06/30/2012  . History of pyelonephritis 06/29/2012  . Nephrolithiasis 06/28/2012    Past Surgical History  Procedure Laterality Date  . Abdominal hysterectomy  2006  . Tonsillectomy    . Lithotripsy      x 4  . Cholecystectomy  03/11/2011    Procedure: LAPAROSCOPIC CHOLECYSTECTOMY WITH INTRAOPERATIVE CHOLANGIOGRAM;  Surgeon: Gayland Curry, MD;  Location: [facility];  Service: General;  Laterality: N/A;  . Cholecystectomy, laparoscopic    . Orif ankle fracture  12/25/2011    Procedure: OPEN REDUCTION INTERNAL FIXATION (ORIF) ANKLE FRACTURE;  Surgeon: Johnn Hai, MD;  Location: [facility];  Service: Orthopedics;  Laterality: Left;  . Cast application  30/08/6224    Procedure: CAST APPLICATION;  Surgeon: Johnn Hai, MD;  Location: [facility];  Service: Orthopedics;  Laterality: Left;  . Appendectomy  07/26/13    Family History  Problem Relation Age of Onset  . Diabetes Mother   . Diabetes Father   . Heart failure Father   . Hypertension Father   . Pulmonary fibrosis Father   . Nephrolithiasis Father     Social History   Social History  . Marital Status: Married    Spouse Name: N/A  . Number of Children: N/A  . Years of Education: N/A   Occupational History  . Not on file.   Social History Main  Topics  . Smoking status: Current Every Day Smoker -- 0.50 packs/day for 15 years    Types: Cigarettes    Start date: 11/03/1999  . Smokeless tobacco: Never Used  . Alcohol Use: 0.0 oz/week    0 Standard drinks or equivalent per week     Comment: social  . Drug Use: No  . Sexual Activity: Not on file   Other Topics Concern  . Not on file   Social History Narrative     Current outpatient prescriptions:  .  ALPRAZolam (XANAX XR) 0.5 MG 24 hr tablet, Take 0.5 mg by mouth daily as needed. for anxiety, Disp: , Rfl: 2 .  Cholecalciferol (VITAMIN D-3 PO), Take 1 tablet by mouth daily., Disp: , Rfl:  .  eletriptan (RELPAX) 40 MG tablet,  Take 1 tablet (40 mg total) by mouth daily as needed for migraine or headache. One tablet by mouth at onset of headache. May repeat in 2 hours if headache persists or recurs., Disp: 10 tablet, Rfl: 0 .  omeprazole (PRILOSEC) 40 MG capsule, Take 40 mg by mouth daily., Disp: , Rfl: 4 .  promethazine (PHENERGAN) 25 MG tablet, TAKE 1/2-1 TABLET BY MOUTH EVERY 6 HOURS AS NEEDED FOR NAUSEA, Disp: , Rfl: 0 .  topiramate (TOPAMAX) 100 MG tablet, Take 1 tablet (100 mg total) by mouth 2 (two) times daily., Disp: 90 tablet, Rfl: 1  Allergies  Allergen Reactions  . Compazine Other (See Comments)    dystonia  . Metoclopramide Hcl Other (See Comments)    dystonia     ROS  Constitutional: Negative for fever or weight change.  Respiratory: Negative for cough and shortness of breath.   Cardiovascular: Negative for chest pain or palpitations.  Gastrointestinal: Negative for abdominal pain, no bowel changes.  Musculoskeletal: Negative for gait problem or joint swelling.  Skin: Negative for rash.  Neurological: Negative for dizziness , has headaches.  No other specific complaints in a complete review of systems (except as listed in HPI above).  Objective  Filed Vitals:   11/03/14 1523  BP: 88/66  Pulse: 75  Temp: 98.3 F (36.8 C)  TempSrc: Oral  Resp: 16  Height: '5\' 7"'  (1.702 m)  Weight: 175 lb 1.6 oz (79.425 kg)  SpO2: 98%    Body mass index is 27.42 kg/(m^2).  Physical Exam  Constitutional: Patient appears well-developed and well-nourished.  No distress.  HEENT: head atraumatic, normocephalic, pupils equal and reactive to light,  neck supple, throat within normal limits Cardiovascular: Normal rate, regular rhythm and normal heart sounds.  No murmur heard. No BLE edema. Pulmonary/Chest: Effort normal and breath sounds normal. No respiratory distress. Abdominal: Soft.  There is no tenderness. Pain during palpation of left lower quadrant, normal bowel sounds Psychiatric: Patient has a  normal mood and affect. behavior is normal. Judgment and thought content normal.  Recent Results (from the past 2160 hour(s))  Lipase, blood     Status: Abnormal   Collection Time: 10/10/14 11:44 AM  Result Value Ref Range   Lipase 17 (L) 22 - 51 U/L  Comprehensive metabolic panel     Status: Abnormal   Collection Time: 10/10/14 11:44 AM  Result Value Ref Range   Sodium 140 135 - 145 mmol/L   Potassium 3.9 3.5 - 5.1 mmol/L   Chloride 112 (H) 101 - 111 mmol/L   CO2 20 (L) 22 - 32 mmol/L   Glucose, Bld 90 65 - 99 mg/dL   BUN 8 6 - 20 mg/dL  Creatinine, Ser 0.80 0.44 - 1.00 mg/dL   Calcium 9.4 8.9 - 10.3 mg/dL   Total Protein 7.7 6.5 - 8.1 g/dL   Albumin 4.5 3.5 - 5.0 g/dL   AST 17 15 - 41 U/L   ALT 9 (L) 14 - 54 U/L   Alkaline Phosphatase 68 38 - 126 U/L   Total Bilirubin 0.1 (L) 0.3 - 1.2 mg/dL   GFR calc non Af Amer >60 >60 mL/min   GFR calc Af Amer >60 >60 mL/min    Comment: (NOTE) The eGFR has been calculated using the CKD EPI equation. This calculation has not been validated in all clinical situations. eGFR's persistently <60 mL/min signify possible Chronic Kidney Disease.    Anion gap 8 5 - 15  CBC     Status: None   Collection Time: 10/10/14 11:44 AM  Result Value Ref Range   WBC 7.7 3.6 - 11.0 K/uL   RBC 4.54 3.80 - 5.20 MIL/uL   Hemoglobin 14.0 12.0 - 16.0 g/dL   HCT 41.8 35.0 - 47.0 %   MCV 92.1 80.0 - 100.0 fL   MCH 30.9 26.0 - 34.0 pg   MCHC 33.5 32.0 - 36.0 g/dL   RDW 13.0 11.5 - 14.5 %   Platelets 219 150 - 440 K/uL  Urinalysis complete, with microscopic ([facility] only)     Status: Abnormal   Collection Time: 10/10/14 11:44 AM  Result Value Ref Range   Color, Urine YELLOW (A) YELLOW   APPearance CLEAR (A) CLEAR   Glucose, UA NEGATIVE NEGATIVE mg/dL   Bilirubin Urine NEGATIVE NEGATIVE   Ketones, ur NEGATIVE NEGATIVE mg/dL   Specific Gravity, Urine 1.016 1.005 - 1.030   Hgb urine dipstick 3+ (A) NEGATIVE   pH 5.0 5.0 - 8.0   Protein, ur NEGATIVE NEGATIVE  mg/dL   Nitrite NEGATIVE NEGATIVE   Leukocytes, UA NEGATIVE NEGATIVE   RBC / HPF TOO NUMEROUS TO COUNT 0 - 5 RBC/hpf   WBC, UA 0-5 0 - 5 WBC/hpf   Bacteria, UA NONE SEEN NONE SEEN   Squamous Epithelial / LPF 0-5 (A) NONE SEEN   Mucous PRESENT   Pregnancy, urine     Status: None   Collection Time: 10/10/14 11:44 AM  Result Value Ref Range   Preg Test, Ur NEGATIVE NEGATIVE  Urine culture     Status: None   Collection Time: 10/10/14 11:44 AM  Result Value Ref Range   Specimen Description URINE, RANDOM    Special Requests NONE    Culture MULTIPLE SPECIES PRESENT, SUGGEST RECOLLECTION    Report Status 10/12/2014 FINAL      PHQ2/9: Depression screen PHQ 2/9 11/03/2014  Decreased Interest 0  Down, Depressed, Hopeless 0  PHQ - 2 Score 0     Fall Risk: Fall Risk  11/03/2014  Falls in the past year? No      Functional Status Survey: Is the patient deaf or have difficulty hearing?: No Does the patient have difficulty seeing, even when wearing glasses/contacts?: No Does the patient have difficulty concentrating, remembering, or making decisions?: No Does the patient have difficulty walking or climbing stairs?: No Does the patient have difficulty dressing or bathing?: No Does the patient have difficulty doing errands alone such as visiting a doctor's office or shopping?: No    Assessment & Plan  1. Asthma, mild intermittent, uncomplicated Doing well at this time  2. Chronic constipation Refer to GI, using multiple medications  3. Nephrolithiasis Follow up with Nephrologist  4. Left  lower quadrant pain Worse with laxatives, had to go to [facility], may need a colonoscopy  - Ambulatory referral to Gastroenterology   5. Migraine without aura and without status migrainosus, not intractable Needs to go back to Neurologist, used to get trigger point injections - topiramate (TOPAMAX) 100 MG tablet; Take 2 tablets (200 mg total) by mouth daily.  Dispense: 180 tablet; Refill: 1 -  eletriptan (RELPAX) 40 MG tablet; Take 1 tablet (40 mg total) by mouth daily as needed for migraine or headache. One tablet by mouth at onset of headache. May repeat in 2 hours if headache persists or recurs.  Dispense: 10 tablet; Refill: 2 - promethazine (PHENERGAN) 25 MG tablet; Take 1 tablet (25 mg total) by mouth every 6 (six) hours as needed for nausea or vomiting.  Dispense: 30 tablet; Refill: 0  6. Gastroesophageal reflux disease without esophagitis Well controlled  - omeprazole (PRILOSEC) 40 MG capsule; Take 1 capsule (40 mg total) by mouth daily.  Dispense: 30 capsule; Refill: 5  7. Generalized anxiety disorder Stable on medication  - ALPRAZolam (XANAX XR) 0.5 MG 24 hr tablet; Take 1 tablet (0.5 mg total) by mouth daily. for anxiety  Dispense: 30 tablet; Refill: 2

## 2014-11-03 NOTE — Progress Notes (Signed)
   11/03/14 1542  Asthma History  Symptoms 0-2 days/week  Nighttime Awakenings 0-2/month  Asthma interference with normal activity No limitations  SABA use (not for EIB) 0-2 days/wk  Risk: Exacerbations requiring oral systemic steroids 0-1 / year  Asthma Severity Intermittent

## 2014-11-15 ENCOUNTER — Encounter: Payer: Self-pay | Admitting: Gastroenterology

## 2014-12-27 ENCOUNTER — Encounter: Payer: Self-pay | Admitting: Gastroenterology

## 2014-12-27 ENCOUNTER — Ambulatory Visit (INDEPENDENT_AMBULATORY_CARE_PROVIDER_SITE_OTHER): Payer: BLUE CROSS/BLUE SHIELD | Admitting: Gastroenterology

## 2014-12-27 VITALS — BP 103/67 | HR 60 | Temp 98.1°F | Ht 67.0 in | Wt 177.8 lb

## 2014-12-27 DIAGNOSIS — K5909 Other constipation: Secondary | ICD-10-CM

## 2014-12-27 DIAGNOSIS — K5900 Constipation, unspecified: Secondary | ICD-10-CM

## 2014-12-27 DIAGNOSIS — R1032 Left lower quadrant pain: Secondary | ICD-10-CM | POA: Diagnosis not present

## 2014-12-27 NOTE — Progress Notes (Signed)
Gastroenterology Consultation  Referring Provider:     Alba CorySowles, Krichna, MD Primary Care Physician:  Ruel FavorsKrichna F Sowles, MD Primary Gastroenterologist:  Dr. Servando SnareWohl     Reason for Consultation:     Left lower quadrant pain and constipation        HPI:   [name] is a 34 y.o. y/o female referred for consultation & management of  Left lower quadrant pain and constipation by Dr. Ruel FavorsKrichna F Sowles, MD.   This patient reports that she has had constipation throughout most of her life. The patient states that she was tried on Linzess and reports that this resulted in her having left lower quadrant pain. The patient stop the medication but continues to have the pain in the left lower quadrant. She reports that she has to take enemas to have a bowel movement at certain times. She also states that she gets severe left lower quadrant pain with any stimulant laxatives. The patient denies any unexplained weight loss. There is also no report of any black stools or bloody stools. She denies any family history of colon cancer or colon polyps. The patient reports having a colonoscopy a few years ago without any findings that were worrisome. The patient reports that she also has bloating with the constipation.  Past Medical History  Diagnosis Date  . Migraine   . Gallstones   . Kidney stones     bilateral  . Acute pyelonephritis 06/29/2012  . Pyelonephritis 2015  . Anxiety   . Asthma   . Pelvic pain in female   . Renal mass     right  . Tobacco abuse     Past Surgical History  Procedure Laterality Date  . Abdominal hysterectomy  2006  . Tonsillectomy    . Lithotripsy      x 4  . Cholecystectomy  03/11/2011    Procedure: LAPAROSCOPIC CHOLECYSTECTOMY WITH INTRAOPERATIVE CHOLANGIOGRAM;  Surgeon: Atilano InaEric M Wilson, MD;  [facility];  Service: General;  Laterality: N/A;  . Cholecystectomy, laparoscopic    . Orif ankle fracture  12/25/2011    Procedure: OPEN REDUCTION INTERNAL FIXATION (ORIF) ANKLE  FRACTURE;  Surgeon: Javier DockerJeffrey C Beane, MD;  Location: [facility] ORS;  Service: Orthopedics;  Laterality: Left;  . Cast application  12/25/2011    Procedure: CAST APPLICATION;  Surgeon: Javier DockerJeffrey C Beane, MD;  Location: [facility]S;  Service: Orthopedics;  Laterality: Left;  . Appendectomy  07/26/13    Prior to Admission medications   Medication Sig Start Date End Date Taking? Authorizing Provider  ALPRAZolam (XANAX XR) 0.5 MG 24 hr tablet Take 1 tablet (0.5 mg total) by mouth daily. for anxiety 11/03/14  Yes Alba CoryKrichna Sowles, MD  Cholecalciferol (VITAMIN D-3 PO) Take 1 tablet by mouth daily.   Yes Historical Provider, MD  eletriptan (RELPAX) 40 MG tablet Take 1 tablet (40 mg total) by mouth daily as needed for migraine or headache. One tablet by mouth at onset of headache. May repeat in 2 hours if headache persists or recurs. 11/03/14  Yes Alba CoryKrichna Sowles, MD  omeprazole (PRILOSEC) 40 MG capsule Take 1 capsule (40 mg total) by mouth daily. 11/03/14  Yes Alba CoryKrichna Sowles, MD  promethazine (PHENERGAN) 25 MG tablet Take 1 tablet (25 mg total) by mouth every 6 (six) hours as needed for nausea or vomiting. 11/03/14  Yes Alba CoryKrichna Sowles, MD  topiramate (TOPAMAX) 100 MG tablet Take 2 tablets (200 mg total) by mouth daily. 11/03/14  Yes Alba CoryKrichna Sowles, MD  Family History  Problem Relation Age of Onset  . Diabetes Mother   . Diabetes Father   . Heart failure Father   . Hypertension Father   . Pulmonary fibrosis Father   . Nephrolithiasis Father      Social History  Substance Use Topics  . Smoking status: Former Smoker -- 0.50 packs/day for 15 years    Types: Cigarettes    Start date: 11/03/1999  . Smokeless tobacco: Never Used  . Alcohol Use: 0.0 oz/week    0 Standard drinks or equivalent per week     Comment: social    Allergies as of 12/27/2014 - Review Complete 12/27/2014  Allergen Reaction Noted  . Compazine Other (See Comments) 01/19/2011  . Metoclopramide hcl Other (See Comments) 01/19/2011    Review of  Systems:    All systems reviewed and negative except where noted in HPI.   Physical Exam:  BP 103/67 mmHg  Pulse 60  Temp(Src) 98.1 F (36.7 C) (Oral)  Ht  (1.702 m)  Wt 177 lb 12.8 oz (80.65 kg)  BMI 27.84 kg/m2 No LMP recorded. Patient has had a hysterectomy. Psych:  Alert and cooperative. Normal mood and affect. General:   Alert,  Well-developed, well-nourished, pleasant and cooperative in NAD Head:  Normocephalic and atraumatic. Eyes:  Sclera clear, no icterus.   Conjunctiva pink. Ears:  Normal auditory acuity. Nose:  No deformity, discharge, or lesions. Mouth:  No deformity or lesions,oropharynx pink & moist. Neck:  Supple; no masses or thyromegaly. Lungs:  Respirations even and unlabored.  Clear throughout to auscultation.   No wheezes, crackles, or rhonchi. No acute distress. Heart:  Regular rate and rhythm; no murmurs, clicks, rubs, or gallops. Abdomen:  Normal bowel sounds.  No bruits.  Soft, non-tender and non-distended without masses, hepatosplenomegaly or hernias noted.  No guarding or rebound tenderness.  Negative Carnett sign.   Rectal:  Deferred.  Msk:  Symmetrical without gross deformities.  Good, equal movement & strength bilaterally. Pulses:  Normal pulses noted. Extremities:  No clubbing or edema.  No cyanosis. Neurologic:  Alert and oriented x3;  grossly normal neurologically. Skin:  Intact without significant lesions or rashes.  No jaundice. Lymph Nodes:  No significant cervical adenopathy. Psych:  Alert and cooperative. Normal mood and affect.  Imaging Studies: No results found.  Assessment and Plan:   [name] is a 34 y.o. y/o female  who comes in today with a history of chronic constipation that is now not being helped by her present medications. The patient feels like she had a side effect the Linzess. The patient has been told to use a step up approach to her constipation. She will be started on Amitiza 24 g twice a day. If this does not  help her symptoms she will and Miralax daily. If this also does not work she has been told to try adding milk of magnesia or fiber or prune juice to her daily regiment. The patient states she understands the plan and agrees with it.   Note: This dictation was prepared with Dragon dictation along with smaller phrase technology. Any transcriptional errors that result from this process are unintentional.

## 2015-01-05 ENCOUNTER — Telehealth: Payer: Self-pay | Admitting: Gastroenterology

## 2015-01-05 NOTE — Telephone Encounter (Signed)
Patient saw Dr Servando SnareWohl on November 1st. She was given samples of Amitiza and patient states it has helped her. She is about out of them and would like a refill called into her pharmacy, [facility] on [address] in [place]. Thanks.

## 2015-01-06 ENCOUNTER — Other Ambulatory Visit: Payer: Self-pay

## 2015-01-06 DIAGNOSIS — K5909 Other constipation: Secondary | ICD-10-CM

## 2015-01-06 MED ORDER — LUBIPROSTONE 24 MCG PO CAPS: 24.0000 ug | ORAL_CAPSULE | Freq: Two times a day (BID) | ORAL | Status: DC

## 2015-01-06 NOTE — Telephone Encounter (Signed)
LVM letting pt know rx has been sent to her pharmacy.

## 2015-02-02 ENCOUNTER — Encounter: Payer: Self-pay | Admitting: Family Medicine

## 2015-02-02 ENCOUNTER — Telehealth: Payer: Self-pay

## 2015-02-02 ENCOUNTER — Ambulatory Visit (INDEPENDENT_AMBULATORY_CARE_PROVIDER_SITE_OTHER): Payer: BLUE CROSS/BLUE SHIELD | Admitting: Family Medicine

## 2015-02-02 VITALS — BP 106/62 | HR 69 | Temp 98.0°F | Resp 18 | Ht 67.0 in | Wt 178.0 lb

## 2015-02-02 DIAGNOSIS — G43009 Migraine without aura, not intractable, without status migrainosus: Secondary | ICD-10-CM

## 2015-02-02 DIAGNOSIS — J4520 Mild intermittent asthma, uncomplicated: Secondary | ICD-10-CM

## 2015-02-02 DIAGNOSIS — K5900 Constipation, unspecified: Secondary | ICD-10-CM

## 2015-02-02 DIAGNOSIS — F411 Generalized anxiety disorder: Secondary | ICD-10-CM | POA: Diagnosis not present

## 2015-02-02 DIAGNOSIS — B009 Herpesviral infection, unspecified: Secondary | ICD-10-CM

## 2015-02-02 DIAGNOSIS — K589 Irritable bowel syndrome without diarrhea: Secondary | ICD-10-CM | POA: Diagnosis not present

## 2015-02-02 DIAGNOSIS — K5909 Other constipation: Secondary | ICD-10-CM

## 2015-02-02 MED ORDER — ELETRIPTAN HYDROBROMIDE 40 MG PO TABS: 40.0000 mg | ORAL_TABLET | Freq: Every day | ORAL | Status: DC | PRN

## 2015-02-02 MED ORDER — VALACYCLOVIR HCL 500 MG PO TABS: 500.0000 mg | ORAL_TABLET | Freq: Two times a day (BID) | ORAL | Status: DC | PRN

## 2015-02-02 MED ORDER — ALPRAZOLAM ER 0.5 MG PO TB24: 0.5000 mg | ORAL_TABLET | Freq: Every day | ORAL | Status: DC

## 2015-02-02 NOTE — Telephone Encounter (Signed)
Please schedule 3 month f/u

## 2015-02-02 NOTE — Progress Notes (Signed)
Name: [name]   MRN: [id number]    DOB: 1980/09/02   Date:02/02/2015       Progress Note  Subjective  Chief Complaint  Chief Complaint  Patient presents with  . Follow-up    Three month.  . Medication Refill  . Gastroesophageal Reflux    Well controlled  . Anxiety    Unchanged  . Migraine    Has improved  . Mouth Lesions    Patient was on acyclovir and would like a refill    HPI  Chronic Constipation/IBS: seen by Dr. Lars Pinks, switched from Linzess to Amitiza and is doing much better. He diagnosed with constipation type IBS. Abdominal pain is very seldom now, about once every two weeks. Described as cramping like. She has bowel movements every other day, and normal in size and soft. No straining.   GERD: under control with medication. 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 She states symptoms are controlled at work, but gets home and full blow anxiety.  Panic attacks seldom now  Migraine Headache: she has no aura, she takes Topamax daily she states she had only two episodes last month.  She used to see Neurologist but has not been seen lately. Associated with nausea and sometimes vomiting, no phonophobia or photophobia  Nephrolithiasis: no recent episode. Going to [facility] and is doing well at this time  Asthma: doing well, no wheezing, cough or SOB  Fever Blister: a couple times a year, but not responding to otc medication, would like to go back on prescription to take prn  Patient Active Problem List   Diagnosis Date Noted  . IBS (irritable bowel syndrome) 02/02/2015  . Asthma, mild intermittent 11/03/2014  . Migraine without aura or status migrainosus 11/03/2014  . Gastroesophageal reflux disease without esophagitis 11/03/2014  . Generalized anxiety disorder 11/03/2014  . Other and unspecified ovarian cyst  08/06/2013  . Left lower quadrant pain 07/16/2013  . Appendicolith 07/16/2013  . Chronic constipation 06/30/2012  . Gross hematuria 06/30/2012  . History of pyelonephritis 06/29/2012  . Nephrolithiasis 06/28/2012    Past Surgical History  Procedure Laterality Date  . Abdominal hysterectomy  2006  . Tonsillectomy    . Lithotripsy      x 4  . Cholecystectomy  03/11/2011    Procedure: LAPAROSCOPIC CHOLECYSTECTOMY WITH INTRAOPERATIVE CHOLANGIOGRAM;  Surgeon: Atilano Ina, MD;  Location: [facility] OR;  Service: General;  Laterality: N/A;  . Cholecystectomy, laparoscopic    . Orif ankle fracture  12/25/2011    Procedure: OPEN REDUCTION INTERNAL FIXATION (ORIF) ANKLE FRACTURE;  Surgeon: Javier Docker, MD;  Location: [facility];  Service: Orthopedics;  Laterality: Left;  . Cast application  12/25/2011    Procedure: CAST APPLICATION;  Surgeon: Javier Docker, MD;  Location: [facility];  Service: Orthopedics;  Laterality: Left;  . Appendectomy  07/26/13    Family History  Problem Relation Age of Onset  . Diabetes Mother   . Diabetes Father   . Heart failure Father   . Hypertension Father   . Pulmonary fibrosis Father   . Nephrolithiasis Father     Social History   Social History  . Marital Status: Married    Spouse Name: N/A  . Number of Children: N/A  . Years of Education: N/A   Occupational History  . Not on  file.   Social History Main Topics  . Smoking status: Former Smoker -- 0.50 packs/day for 15 years    Types: Cigarettes    Start date: 11/03/1999  . Smokeless tobacco: Never Used  . Alcohol Use: 0.0 oz/week    0 Standard drinks or equivalent per week     Comment: social  . Drug Use: No  . Sexual Activity: Not on file   Other Topics Concern  . Not on file   Social History Narrative     Current outpatient prescriptions:  .  ALPRAZolam (XANAX XR) 0.5 MG 24 hr tablet, Take 1 tablet (0.5 mg total) by mouth daily. for anxiety, Disp: 30 tablet, Rfl: 2 .  eletriptan (RELPAX)  40 MG tablet, Take 1 tablet (40 mg total) by mouth daily as needed for migraine or headache. One tablet by mouth at onset of headache. May repeat in 2 hours if headache persists or recurs., Disp: 10 tablet, Rfl: 2 .  lubiprostone (AMITIZA) 24 MCG capsule, Take 1 capsule (24 mcg total) by mouth 2 (two) times daily with a meal., Disp: 60 capsule, Rfl: 6 .  Multiple Vitamin (MULTIVITAMIN WITH MINERALS) TABS tablet, Take 1 tablet by mouth daily., Disp: , Rfl:  .  omeprazole (PRILOSEC) 40 MG capsule, Take 1 capsule (40 mg total) by mouth daily., Disp: 30 capsule, Rfl: 5 .  promethazine (PHENERGAN) 25 MG tablet, Take 1 tablet (25 mg total) by mouth every 6 (six) hours as needed for nausea or vomiting., Disp: 30 tablet, Rfl: 0 .  topiramate (TOPAMAX) 100 MG tablet, Take 2 tablets (200 mg total) by mouth daily., Disp: 180 tablet, Rfl: 1 .  Cholecalciferol (VITAMIN D-3 PO), Take 1 tablet by mouth daily., Disp: , Rfl:   Allergies  Allergen Reactions  . Compazine Other (See Comments)    dystonia  . Metoclopramide Hcl Other (See Comments)    dystonia     ROS  Constitutional: Negative for fever or weight change.  Respiratory: Negative for cough and shortness of breath.   Cardiovascular: Negative for chest pain or palpitations.  Gastrointestinal: Positive  for abdominal pain improve on constipation  Musculoskeletal: Negative for gait problem or joint swelling.  Skin: Negative for rash.  Neurological: Negative for dizziness , intermittent  headache.  No other specific complaints in a complete review of systems (except as listed in HPI above).  Objective  Filed Vitals:   02/02/15 1649  BP: 106/62  Pulse: 69  Temp: 98 F (36.7 C)  TempSrc: Oral  Resp: 18  Height:  (1.702 m)  Weight: 178 lb (80.74 kg)  SpO2: 99%    Body mass index is 27.87 kg/(m^2).  Physical Exam  Constitutional: Patient appears well-developed and well-nourished. No distress.  HEENT: head atraumatic,  normocephalic, pupils equal and reactive to light, neck supple, throat within normal limits Cardiovascular: Normal rate, regular rhythm and normal heart sounds.  No murmur heard. No BLE edema. Pulmonary/Chest: Effort normal and breath sounds normal. No respiratory distress. Abdominal: Soft.  There is no tenderness. Negative CVA tenderness Psychiatric: Patient has a normal mood and affect. behavior is normal. Judgment and thought content normal.   PHQ2/9: Depression screen [facility] 2/9 02/02/2015 11/03/2014  Decreased Interest 0 0  Down, Depressed, Hopeless 0 0  PHQ - 2 Score 0 0     Fall Risk: Fall Risk  02/02/2015 11/03/2014  Falls in the past year? No No    Functional Status Survey: Is the patient deaf or have difficulty hearing?: No  Does the patient have difficulty seeing, even when wearing glasses/contacts?: No Does the patient have difficulty concentrating, remembering, or making decisions?: No Does the patient have difficulty walking or climbing stairs?: No Does the patient have difficulty dressing or bathing?: No Does the patient have difficulty doing errands alone such as visiting a doctor's office or shopping?: No    Assessment & Plan  1. Chronic constipation  Improved with Amitiza  2. Asthma, mild intermittent, uncomplicated  Doing well  3. Migraine without aura and without status migrainosus, not intractable  - eletriptan (RELPAX) 40 MG tablet; Take 1 tablet (40 mg total) by mouth daily as needed for migraine or headache. One tablet by mouth at onset of headache. May repeat in 2 hours if headache persists or recurs.  Dispense: 10 tablet; Refill: 2  4. IBS (irritable bowel syndrome)  On Amitiza, doing better  5. Generalized anxiety disorder  Discussed starting her on Cymbalta, she is afraid to take it at this time, but she will call back, anxiety is not controlled - ALPRAZolam (XANAX XR) 0.5 MG 24 hr tablet; Take 1 tablet (0.5 mg total) by mouth daily. for anxiety   Dispense: 30 tablet; Refill: 2  6. Herpes simplex  - valACYclovir (VALTREX) 500 MG tablet; Take 1-2 tablets (500-1,000 mg total) by mouth 2 (two) times daily as needed.  Dispense: 30 tablet; Refill: 0

## 2015-02-03 NOTE — Telephone Encounter (Signed)
Appointment made for March

## 2015-02-26 DIAGNOSIS — N2889 Other specified disorders of kidney and ureter: Secondary | ICD-10-CM

## 2015-02-26 HISTORY — PX: ANKLE ARTHROSCOPY: SUR85

## 2015-02-26 HISTORY — DX: Other specified disorders of kidney and ureter: N28.89

## 2015-03-13 DIAGNOSIS — M7071 Other bursitis of hip, right hip: Secondary | ICD-10-CM | POA: Insufficient documentation

## 2015-04-06 ENCOUNTER — Ambulatory Visit: Payer: BLUE CROSS/BLUE SHIELD | Admitting: Physician Assistant

## 2015-04-07 ENCOUNTER — Encounter: Payer: Self-pay | Admitting: Physician Assistant

## 2015-04-07 ENCOUNTER — Ambulatory Visit (INDEPENDENT_AMBULATORY_CARE_PROVIDER_SITE_OTHER): Payer: BLUE CROSS/BLUE SHIELD | Admitting: Physician Assistant

## 2015-04-07 VITALS — BP 124/82 | HR 72 | Temp 98.4°F | Resp 14 | Ht 66.25 in | Wt 179.0 lb

## 2015-04-07 DIAGNOSIS — F411 Generalized anxiety disorder: Secondary | ICD-10-CM | POA: Diagnosis not present

## 2015-04-07 DIAGNOSIS — Z7189 Other specified counseling: Secondary | ICD-10-CM

## 2015-04-07 DIAGNOSIS — M797 Fibromyalgia: Secondary | ICD-10-CM

## 2015-04-07 DIAGNOSIS — Z7689 Persons encountering health services in other specified circumstances: Secondary | ICD-10-CM

## 2015-04-07 MED ORDER — ALPRAZOLAM 0.5 MG PO TABS: 0.5000 mg | ORAL_TABLET | Freq: Every evening | ORAL | Status: DC | PRN

## 2015-04-07 NOTE — Progress Notes (Signed)
Patient ID: [name], female   DOB: 01-Feb-1981, 35 y.o.   MRN: [id number]       Patient: [name] Female    DOB: February 09, 1981   36 y.o.   MRN: [id number] Visit Date: 04/07/2015  Today's Provider: [name], PA-C   Chief Complaint  Patient presents with  . Establish Care  . Anxiety   Subjective:    HPI  Patient is here to get established. She has been seen Dr. Carlynn Purl. She has not been happy with that doctor and has been looking to switch.  For recurrent kidney stones issues she is been seen at [facility]. She has had multiple lithotripsies, ureteral stents and basket collection. She states that her left kidney causes the most kidney stones but occasionally has had some from her right kidney. Her father also had kidney stones. They have been found to be calcium oxalate. She does try to watch her diet. She has limited soft drinks and sweet tea and alcohol from her diet. She does however have well water at her home but tries to drink bottled water.  For constipation she has been followed by [facility], she thinks Dr. Tonie Griffith DX with that doctor was IBS with constipation. She is taking Amitiza 24 mg but only 1 to 2 times a week due to her migraines have been getting worse while on this medication daily. She is trying some more natural things to see if this helps her symptoms. She is going to try probiotics, increase water intake and fiber.  She also has history of acid reflux which is controlled with omeprazole 40 mg. She has however been trying to lose weight and has quit smoking and is going to try to decrease the omeprazole dose over a period of time.  She also has generalized anxiety disorder. She has previously been on Zoloft, Paxil, Lexapro without relief. She states that all of these caused her increased fatigue and made her feel like a zombie. She states that her symptoms are controlled well with Xanax. She does have some coping techniques that she  also uses as well.  She also has been diagnosed with fibromyalgia. She states that she feels this has been worsening in her lower extremities bilaterally. She states that she feels very achy from her low back through her hips, through her muscles all the way to her feet. She states that she feels like she just has 1 big bruise on the lower half of her body. She currently is using ibuprofen as needed for pain and also is using warm water such as shower and bath to help with relaxation of the muscles.    Allergies  Allergen Reactions  . Compazine Other (See Comments)    dystonia  . Metoclopramide Hcl Other (See Comments)    dystonia   Previous Medications   ALPRAZOLAM (XANAX XR) 0.5 MG 24 HR TABLET    Take 1 tablet (0.5 mg total) by mouth daily. for anxiety   CHOLECALCIFEROL (VITAMIN D-3 PO)    Take 1 tablet by mouth daily.   ELETRIPTAN (RELPAX) 40 MG TABLET    Take 1 tablet (40 mg total) by mouth daily as needed for migraine or headache. One tablet by mouth at onset of headache. May repeat in 2 hours if headache persists or recurs.   LUBIPROSTONE (AMITIZA) 24 MCG CAPSULE    Take 1 capsule (24 mcg total) by mouth 2 (two) times daily with a meal.   MULTIPLE VITAMIN (MULTIVITAMIN WITH  MINERALS) TABS TABLET    Take 1 tablet by mouth daily.   OMEPRAZOLE (PRILOSEC) 40 MG CAPSULE    Take 1 capsule (40 mg total) by mouth daily.   PROMETHAZINE (PHENERGAN) 25 MG TABLET    Take 1 tablet (25 mg total) by mouth every 6 (six) hours as needed for nausea or vomiting.   TOPIRAMATE (TOPAMAX) 100 MG TABLET    Take 2 tablets (200 mg total) by mouth daily.   VALACYCLOVIR (VALTREX) 500 MG TABLET    Take 1-2 tablets (500-1,000 mg total) by mouth 2 (two) times daily as needed.    Review of Systems  Constitutional: Negative.   Eyes: Negative.   Respiratory: Negative.   Cardiovascular: Negative.   Gastrointestinal: Positive for abdominal pain and constipation.  Endocrine: Negative.   Genitourinary: Positive for  hematuria and flank pain.  Musculoskeletal: Positive for myalgias, back pain and arthralgias.  Allergic/Immunologic: Negative.   Neurological: Positive for dizziness and headaches.  Hematological: Negative.   Psychiatric/Behavioral: Positive for sleep disturbance and agitation. The patient is nervous/anxious.     Social History  Substance Use Topics  . Smoking status: Former Smoker -- 0.50 packs/day for 15 years    Types: Cigarettes    Start date: 11/03/1999    Quit date: 09/26/2014  . Smokeless tobacco: Never Used  . Alcohol Use: 0.0 oz/week    0 Standard drinks or equivalent per week     Comment: social-rare   Objective:   BP 124/82 mmHg  Pulse 72  Temp(Src) 98.4 F (36.9 C)  Resp 14  Ht 5' 6.25" (1.683 m)  Wt 179 lb (81.194 kg)  BMI 28.67 kg/m2  Physical Exam  Constitutional: She is oriented to person, place, and time. She appears well-developed and well-nourished. No distress.  HENT:  Head: Normocephalic and atraumatic.  Right Ear: External ear normal.  Left Ear: External ear normal.  Nose: Nose normal.  Mouth/Throat: Oropharynx is clear and moist. No oropharyngeal exudate.  Eyes: Conjunctivae and EOM are normal. Pupils are equal, round, and reactive to light. Right eye exhibits no discharge. Left eye exhibits no discharge. No scleral icterus.  Neck: Normal range of motion. Neck supple. No JVD present. No tracheal deviation present. No thyromegaly present.  Cardiovascular: Normal rate, regular rhythm, normal heart sounds and intact distal pulses.  Exam reveals no gallop and no friction rub.   No murmur heard. Pulmonary/Chest: Effort normal and breath sounds normal. No respiratory distress. She has no wheezes. She has no rales. She exhibits no tenderness.  Abdominal: Soft. Bowel sounds are normal. She exhibits no distension and no mass. There is no tenderness. There is no rebound and no guarding.  Musculoskeletal: Normal range of motion. She exhibits no edema or  tenderness.  Lymphadenopathy:    She has no cervical adenopathy.  Neurological: She is alert and oriented to person, place, and time.  Skin: Skin is warm and dry. No rash noted. She is not diaphoretic.  Psychiatric: She has a normal mood and affect. Her behavior is normal. Judgment and thought content normal.  Vitals reviewed.       Assessment & Plan:     1. Generalized anxiety disorder We'll switch her Xanax to the dosing as below. I did advise her to try to keep up with how many she has to take per day on average. If she is taking all 3 daily we'll consider adding either Wellbutrin or Celexa for daily control. Also discussed different sleep techniques and sleep meditation that she  may use to help her relax in the evening so that she can get better rest. She may also take one Xanax prior to bedtime to help with sleep as well. I will see her back in 4 weeks to evaluate how she is doing. She is to call the office if she has any acute issues, questions or concerns in the meantime. - ALPRAZolam (XANAX) 0.5 MG tablet; Take 1 tablet (0.5 mg total) by mouth at bedtime as needed for anxiety. May take 1/2 tab to 1 tab BID prn through day for panic attacks  Dispense: 90 tablet; Refill: 0  2. Establishing care with new doctor, encounter for Previous patient of Dr. Carlynn Purl. Establishing due to dissatisfaction.  3. Fibromyalgia Worsening symptoms over the last month. Advised that the best treatment for fibromyalgia is physical activity. She states that she noticed last summer she was better when her her husband started hiking more. She states that she is going to try to start hiking more and finding time on the weekends do more physical activity with her self and her family. Also advised that if we add Celexa or Wellbutrin as discussed and generalized anxiety disorder plan that may help some of her symptoms as well. We also discussed that with her continued weight loss she may notice some of her symptoms  improving with that as well. I will see her back in 4 weeks and we will see how she is doing at that time.       [name], PA-C  [facility]

## 2015-04-07 NOTE — Patient Instructions (Signed)
Myofascial Pain Syndrome and Fibromyalgia Myofascial pain syndrome and fibromyalgia are both pain disorders. This pain may be felt mainly in your muscles.   Myofascial pain syndrome:  Always has trigger points or tender points in the muscle that will cause pain when pressed. The pain may come and go.  Usually affects your neck, upper back, and shoulder areas. The pain often radiates into your arms and hands.  Fibromyalgia:  Has muscle pains and tenderness that come and go.  Is often associated with fatigue and sleep disturbances.  Has trigger points.  Tends to be long-lasting (chronic), but is not life-threatening. Fibromyalgia and myofascial pain are not the same. However, they often occur together. If you have both conditions, each can make the other worse. Both are common and can cause enough pain and fatigue to make day-to-day activities difficult.  CAUSES  The exact causes of fibromyalgia and myofascial pain are not known. People with certain gene types may be more likely to develop fibromyalgia. Some factors can be triggers for both conditions, such as:   Spine disorders.  Arthritis.  Severe injury (trauma) and other physical stressors.  Being under a lot of stress.  A medical illness. SIGNS AND SYMPTOMS  Fibromyalgia The main symptom of fibromyalgia is widespread pain and tenderness in your muscles. This can vary over time. Pain is sometimes described as stabbing, shooting, or burning. You may have tingling or numbness, too. You may also have sleep problems and fatigue. You may wake up feeling tired and groggy (fibro fog). Other symptoms may include:   Bowel and bladder problems.  Headaches.  Visual problems.  Problems with odors and noises.  Depression or mood changes.  Painful menstrual periods (dysmenorrhea).  Dry skin or eyes. Myofascial pain syndrome Symptoms of myofascial pain syndrome include:   Tight, ropy bands of muscle.   Uncomfortable  sensations in muscular areas, such as:  Aching.  Cramping.  Burning.  Numbness.  Tingling.   Muscle weakness.  Trouble moving certain muscles freely (range of motion). DIAGNOSIS  There are no specific tests to diagnose fibromyalgia or myofascial pain syndrome. Both can be hard to diagnose because their symptoms are common in many other conditions. Your health care provider may suspect one or both of these conditions based on your symptoms and medical history. Your health care provider will also do a physical exam.  The key to diagnosing fibromyalgia is having pain, fatigue, and other symptoms for more than three months that cannot be explained by another condition.  The key to diagnosing myofascial pain syndrome is finding trigger points in muscles that are tender and cause pain elsewhere in your body (referred pain). TREATMENT  Treating fibromyalgia and myofascial pain often requires a team of health care providers. This usually starts with your primary provider and a physical therapist. You may also find it helpful to work with alternative health care providers, such as massage therapists or acupuncturists. Treatment for fibromyalgia may include medicines. This may include nonsteroidal anti-inflammatory drugs (NSAIDs), along with other medicines.  Treatment for myofascial pain may also include:  NSAIDs.  Cooling and stretching of muscles.  Trigger point injections.  Sound wave (ultrasound) treatments to stimulate muscles. HOME CARE INSTRUCTIONS   Take medicines only as directed by your health care provider.  Exercise as directed by your health care provider or physical therapist.  Try to avoid stressful situations.  Practice relaxation techniques to control your stress. You may want to try:  Biofeedback.  Visual imagery.  Hypnosis.  Muscle relaxation.  Yoga.  Meditation.  Talk to your health care provider about alternative treatments, such as acupuncture or  massage treatment.  Maintain a healthy lifestyle. This includes eating a healthy diet and getting enough sleep.  Consider joining a support group.  Do not do activities that stress or strain your muscles. That includes repetitive motions and heavy lifting. SEEK MEDICAL CARE IF:   You have new symptoms.  Your symptoms get worse.  You have side effects from your medicines.  You have trouble sleeping.  Your condition is causing depression or anxiety. FOR MORE INFORMATION   National Fibromyalgia Association: http://www.fmaware.orgwww.fmaware.org  Arthritis Foundation: http://www.arthritis.orgwww.arthritis.org  American Chronic Pain Association: michaeledo.com.CandyDash.co.za   This information is not intended to replace advice given to you by your health care provider. Make sure you discuss any questions you have with your health care provider.   Document Released: 02/11/2005 Document Revised: 03/04/2014 Document Reviewed: 11/17/2013 Elsevier Interactive Patient Education 2016 Elsevier Inc. Generalized Anxiety Disorder Generalized anxiety disorder (GAD) is a mental disorder. It interferes with life functions, including relationships, work, and school. GAD is different from normal anxiety, which everyone experiences at some point in their lives in response to specific life events and activities. Normal anxiety actually helps [place] prepare for and get through these life events and activities. Normal anxiety goes away after the event or activity is over.  GAD causes anxiety that is not necessarily related to specific events or activities. It also causes excess anxiety in proportion to specific events or activities. The anxiety associated with GAD is also difficult to control. GAD can vary from mild to severe. People with severe GAD can have intense waves of anxiety with physical symptoms (panic attacks).  SYMPTOMS The anxiety and  worry associated with GAD are difficult to control. This anxiety and worry are related to many life events and activities and also occur more days than not for 6 months or longer. People with GAD also have three or more of the following symptoms (one or more in children):  Restlessness.   Fatigue.  Difficulty concentrating.   Irritability.  Muscle tension.  Difficulty sleeping or unsatisfying sleep. DIAGNOSIS GAD is diagnosed through an assessment by your health care provider. Your health care provider will ask you questions aboutyour mood,physical symptoms, and events in your life. Your health care provider may ask you about your medical history and use of alcohol or drugs, including prescription medicines. Your health care provider may also do a physical exam and blood tests. Certain medical conditions and the use of certain substances can cause symptoms similar to those associated with GAD. Your health care provider may refer you to a mental health specialist for further evaluation. TREATMENT The following therapies are usually used to treat GAD:   Medication. Antidepressant medication usually is prescribed for long-term daily control. Antianxiety medicines may be added in severe cases, especially when panic attacks occur.   Talk therapy (psychotherapy). Certain types of talk therapy can be helpful in treating GAD by providing support, education, and guidance. A form of talk therapy called cognitive behavioral therapy can teach you healthy ways to think about and react to daily life events and activities.  Stress managementtechniques. These include yoga, meditation, and exercise and can be very helpful when they are practiced regularly. A mental health specialist can help determine which treatment is best for you. Some people see improvement with one therapy. However, other people require a combination of therapies.   This information is not intended to  replace advice given to you  by your health care provider. Make sure you discuss any questions you have with your health care provider.   Document Released: 06/08/2012 Document Revised: 03/04/2014 Document Reviewed: 06/08/2012 Elsevier Interactive Patient Education [employer]

## 2015-04-13 ENCOUNTER — Encounter: Payer: Self-pay | Admitting: Physician Assistant

## 2015-04-13 ENCOUNTER — Ambulatory Visit (INDEPENDENT_AMBULATORY_CARE_PROVIDER_SITE_OTHER): Payer: BLUE CROSS/BLUE SHIELD | Admitting: Physician Assistant

## 2015-04-13 VITALS — BP 92/66 | HR 68 | Temp 98.5°F | Resp 16 | Wt 181.6 lb

## 2015-04-13 DIAGNOSIS — G43119 Migraine with aura, intractable, without status migrainosus: Secondary | ICD-10-CM | POA: Diagnosis not present

## 2015-04-13 MED ORDER — PROMETHAZINE HCL 25 MG/ML IJ SOLN
25.0000 mg | Freq: Once | INTRAMUSCULAR | Status: AC
  Administered 2015-04-13: 25 mg via INTRAMUSCULAR

## 2015-04-13 MED ORDER — METHYLPREDNISOLONE ACETATE 40 MG/ML IJ SUSP
40.0000 mg | Freq: Once | INTRAMUSCULAR | Status: AC
  Administered 2015-04-13: 40 mg via INTRAMUSCULAR

## 2015-04-13 MED ORDER — KETOROLAC TROMETHAMINE 60 MG/2ML IM SOLN
60.0000 mg | Freq: Once | INTRAMUSCULAR | Status: AC
Start: 2015-04-13 — End: 2015-04-13
  Administered 2015-04-13: 60 mg via INTRAMUSCULAR

## 2015-04-13 NOTE — Progress Notes (Signed)
Subjective:     Patient ID: [name], female   DOB: 07/13/1980, 35 y.o.   MRN: [id number]  Migraine  This is a chronic problem. The current episode started in the past 7 days. The problem occurs daily. The problem has been gradually worsening. The pain is located in the left unilateral region. The pain radiates to the left neck. The pain quality is similar to prior headaches. The quality of the pain is described as pulsating and squeezing. The pain is at a severity of 7/10. The pain is moderate. Associated symptoms include blurred vision (sensitive to light), dizziness, drainage, eye pain, eye watering (left), muscle aches, nausea, neck pain, photophobia, scalp tenderness, sinus pressure, vomiting and weakness. Pertinent negatives include no abdominal pain, abnormal behavior, anorexia, back pain, coughing, ear pain, eye redness, facial sweating, fever, hearing loss, insomnia, loss of balance, numbness, phonophobia, rhinorrhea, seizures, sore throat, swollen glands, tingling, tinnitus, visual change or weight loss. Nothing aggravates the symptoms. Treatments tried: Relpax. The treatment provided mild relief. Her past medical history is significant for migraine headaches.  Patient states that symptoms started Sunday morning when she awoke with head congestion. By Sunday night she had started to develop her migraine. She states that it persisted through Monday but was tolerable. Then Tuesday morning it got worse and she took her Relpax twice. She was able to go to sleep and slept through the night but when she awoke Wednesday morning again he migraine persisted and she again took Relpax 2. She did also vomit on Tuesday and take a Zofran injection at her work. Her migraines are weather related and she feels that the change in the weather recently as well as the change in her medication (xanax) I will have onset her current migraine. She states this is one of the worst she has had in a while. This morning  and when she awoke she did attempt to go to work again but the migraines continued and she decided to come in for an appointment.  Review of Systems  Constitutional: Negative for fever and weight loss.  HENT: Positive for congestion and sinus pressure. Negative for ear pain, hearing loss, rhinorrhea, sore throat and tinnitus.   Eyes: Positive for blurred vision (sensitive to light), photophobia and pain. Negative for redness.  Respiratory: Negative for cough, chest tightness and shortness of breath.   Cardiovascular: Negative for chest pain.  Gastrointestinal: Positive for nausea and vomiting. Negative for abdominal pain and anorexia.  Musculoskeletal: Positive for neck pain. Negative for back pain.  Neurological: Positive for dizziness and weakness. Negative for tingling, seizures, numbness and loss of balance.  Psychiatric/Behavioral: The patient does not have insomnia.    Filed Vitals:   04/13/15 0935  BP: 92/66  Pulse: 68  Temp: 98.5 F (36.9 C)  Resp: 16   Patient Active Problem List   Diagnosis Date Noted  . IBS (irritable bowel syndrome) 02/02/2015  . Herpes simplex 02/02/2015  . Asthma, mild intermittent 11/03/2014  . Migraine without aura or status migrainosus 11/03/2014  . Gastroesophageal reflux disease without esophagitis 11/03/2014  . Generalized anxiety disorder 11/03/2014  . Other and unspecified ovarian cyst 08/06/2013  . Left lower quadrant pain 07/16/2013  . Appendicolith 07/16/2013  . Chronic constipation 06/30/2012  . Gross hematuria 06/30/2012  . History of pyelonephritis 06/29/2012  . Nephrolithiasis 06/28/2012   Past Medical History  Diagnosis Date  . Migraine   . Gallstones   . Kidney stones     bilateral  . Acute  pyelonephritis 06/29/2012  . Pyelonephritis 2015  . Anxiety   . Asthma   . Pelvic pain in female   . Renal mass     right  . Tobacco abuse    Current Outpatient Prescriptions on File Prior to Visit  Medication Sig  . ALPRAZolam  (XANAX) 0.5 MG tablet Take 1 tablet (0.5 mg total) by mouth at bedtime as needed for anxiety. May take 1/2 tab to 1 tab BID prn through day for panic attacks  . Cholecalciferol (VITAMIN D-3 PO) Take 1 tablet by mouth daily.  Marland Kitchen eletriptan (RELPAX) 40 MG tablet Take 1 tablet (40 mg total) by mouth daily as needed for migraine or headache. One tablet by mouth at onset of headache. May repeat in 2 hours if headache persists or recurs.  . lubiprostone (AMITIZA) 24 MCG capsule Take 1 capsule (24 mcg total) by mouth 2 (two) times daily with a meal.  . Multiple Vitamin (MULTIVITAMIN WITH MINERALS) TABS tablet Take 1 tablet by mouth daily.  Marland Kitchen omeprazole (PRILOSEC) 40 MG capsule Take 1 capsule (40 mg total) by mouth daily.  . promethazine (PHENERGAN) 25 MG tablet Take 1 tablet (25 mg total) by mouth every 6 (six) hours as needed for nausea or vomiting.  . topiramate (TOPAMAX) 100 MG tablet Take 2 tablets (200 mg total) by mouth daily.  . valACYclovir (VALTREX) 500 MG tablet Take 1-2 tablets (500-1,000 mg total) by mouth 2 (two) times daily as needed.   No current facility-administered medications on file prior to visit.   Allergies  Allergen Reactions  . Compazine Other (See Comments)    dystonia  . Metoclopramide Hcl Other (See Comments)    dystonia   Past Surgical History  Procedure Laterality Date  . Abdominal hysterectomy  2006  . Tonsillectomy    . Lithotripsy      x 4  . Cholecystectomy  03/11/2011    Procedure: LAPAROSCOPIC CHOLECYSTECTOMY WITH INTRAOPERATIVE CHOLANGIOGRAM;  Surgeon: Atilano Ina, MD;  Location[facility] OR;  Service: General;  Laterality: N/A;  . Cholecystectomy, laparoscopic    . Orif ankle fracture  12/25/2011    Procedure: OPEN REDUCTION INTERNAL FIXATION (ORIF) ANKLE FRACTURE;  Surgeon: Javier Docker, MD;  Location: [facility] ORS;  Service: Orthopedics;  Laterality: Left;  . Cast application  12/25/2011    Procedure: CAST APPLICATION;  Surgeon: Javier Docker, MD;  Location:  [facility] ORS;  Service: Orthopedics;  Laterality: Left;  . Appendectomy  07/26/13   Social History   Social History  . Marital Status: Married    Spouse Name: N/A  . Number of Children: N/A  . Years of Education: N/A   Occupational History  . Not on file.   Social History Main Topics  . Smoking status: Former Smoker -- 0.50 packs/day for 15 years    Types: Cigarettes    Start date: 11/03/1999    Quit date: 09/26/2014  . Smokeless tobacco: Never Used  . Alcohol Use: 0.0 oz/week    0 Standard drinks or equivalent per week     Comment: social-rare  . Drug Use: No  . Sexual Activity: Not on file   Other Topics Concern  . Not on file   Social History Narrative   Family History  Problem Relation Age of Onset  . Diabetes Mother   . Diabetes Father   . Heart failure Father   . Hypertension Father   . Pulmonary fibrosis Father   . Nephrolithiasis Father      .result  Objective:   Physical Exam  Constitutional: She is oriented to person, place, and time. She appears well-developed and well-nourished. No distress.  Neck: Normal range of motion. Neck supple. No tracheal deviation present. No thyromegaly present.  Cardiovascular: Normal rate, regular rhythm and normal heart sounds.  Exam reveals no gallop and no friction rub.   No murmur heard. Pulmonary/Chest: Effort normal and breath sounds normal. No respiratory distress. She has no wheezes. She has no rales.  Lymphadenopathy:    She has no cervical adenopathy.  Neurological: She is alert and oriented to person, place, and time. No cranial nerve deficit. Coordination normal.  Skin: She is not diaphoretic.  Vitals reviewed.      Assessment:     1. Intractable migraine with aura without status migrainosus       Plan:     1. Intractable migraine with aura without status migrainosus Intractable migraine that has not responded to her Relpax over 2 days. Will give injections as below for migraine relief. She is to go home  and rest. She did have a driver that came to pick her up from the office. She is to call the office if her migraine persists through tomorrow as well. She is to go to the hospital if her migraine worsens, is associated with intractable vomiting, double vision or unstable gait. - promethazine (PHENERGAN) injection 25 mg; Inject 1 mL (25 mg total) into the muscle once. - methylPREDNISolone acetate (DEPO-MEDROL) injection 40 mg; Inject 1 mL (40 mg total) into the muscle once. - ketorolac (TORADOL) injection 60 mg; Inject 2 mLs (60 mg total) into the muscle once.

## 2015-04-13 NOTE — Patient Instructions (Signed)

## 2015-05-01 ENCOUNTER — Other Ambulatory Visit: Payer: Self-pay | Admitting: Physician Assistant

## 2015-05-01 DIAGNOSIS — G43109 Migraine with aura, not intractable, without status migrainosus: Secondary | ICD-10-CM

## 2015-05-02 NOTE — Telephone Encounter (Signed)
Rx for Relpax was called to [facility] [address] spoke with Brett CanalesSteve.  Thanks,  -Ronesha Heenan

## 2015-05-04 ENCOUNTER — Ambulatory Visit (INDEPENDENT_AMBULATORY_CARE_PROVIDER_SITE_OTHER): Payer: BLUE CROSS/BLUE SHIELD | Admitting: Physician Assistant

## 2015-05-04 ENCOUNTER — Ambulatory Visit: Payer: BLUE CROSS/BLUE SHIELD | Admitting: Family Medicine

## 2015-05-04 ENCOUNTER — Encounter: Payer: Self-pay | Admitting: Physician Assistant

## 2015-05-04 VITALS — BP 112/78 | HR 66 | Temp 98.2°F | Resp 16 | Wt 180.0 lb

## 2015-05-04 DIAGNOSIS — Z87442 Personal history of urinary calculi: Secondary | ICD-10-CM | POA: Diagnosis not present

## 2015-05-04 DIAGNOSIS — F411 Generalized anxiety disorder: Secondary | ICD-10-CM | POA: Diagnosis not present

## 2015-05-04 LAB — POCT URINALYSIS DIPSTICK
Bilirubin, UA: NEGATIVE
Glucose, UA: NEGATIVE
Ketones, UA: NEGATIVE
Leukocytes, UA: NEGATIVE
Nitrite, UA: NEGATIVE
Protein, UA: NEGATIVE
Spec Grav, UA: 1.015
Urobilinogen, UA: 0.2
pH, UA: 7

## 2015-05-04 MED ORDER — ALPRAZOLAM 1 MG PO TABS: ORAL_TABLET | ORAL | Status: DC

## 2015-05-04 NOTE — Patient Instructions (Signed)
Alprazolam tablets What is this medicine? ALPRAZOLAM (al PRAY zoe lam) is a benzodiazepine. It is used to treat anxiety and panic attacks. This medicine may be used for other purposes; ask your health care provider or pharmacist if you have questions. What should I tell my health care provider before I take this medicine? They need to know if you have any of these conditions: -an alcohol or drug abuse problem -bipolar disorder, depression, psychosis or other mental health conditions -glaucoma -kidney or liver disease -lung or breathing disease -myasthenia gravis -Parkinson's disease -porphyria -seizures or a history of seizures -suicidal thoughts -an unusual or allergic reaction to alprazolam, other benzodiazepines, foods, dyes, or preservatives -pregnant or trying to get pregnant -breast-feeding How should I use this medicine? Take this medicine by mouth with a glass of water. Follow the directions on the prescription label. Take your medicine at regular intervals. Do not take it more often than directed. If you have been taking this medicine regularly for some time, do not suddenly stop taking it. You must gradually reduce the dose or you may get severe side effects. Ask your doctor or health care professional for advice. Even after you stop taking this medicine it can still affect your body for several days. Talk to your pediatrician regarding the use of this medicine in children. Special care may be needed. Overdosage: If you think you have taken too much of this medicine contact a poison control center or emergency room at once. NOTE: This medicine is only for you. Do not share this medicine with others. What if I miss a dose? If you miss a dose, take it as soon as you can. If it is almost time for your next dose, take only that dose. Do not take double or extra doses. What may interact with this medicine? Do not take this medicine with any of the following medications: -certain  medicines for HIV infection or AIDS -ketoconazole -itraconazole This medicine may also interact with the following medications: -birth control pills -certain macrolide antibiotics like clarithromycin, erythromycin, troleandomycin -cimetidine -cyclosporine -ergotamine -grapefruit juice -herbal or dietary supplements like kava kava, melatonin, dehydroepiandrosterone, DHEA, St. John's Wort or valerian -imatinib, STI-571 -isoniazid -levodopa -medicines for depression, anxiety, or psychotic disturbances -prescription pain medicines -rifampin, rifapentine, or rifabutin -some medicines for blood pressure or heart problems -some medicines for seizures like carbamazepine, oxcarbazepine, phenobarbital, phenytoin, primidone This list may not describe all possible interactions. Give your health care provider a list of all the medicines, herbs, non-prescription drugs, or dietary supplements you use. Also tell them if you smoke, drink alcohol, or use illegal drugs. Some items may interact with your medicine. What should I watch for while using this medicine? Visit your doctor or health care professional for regular checks on your progress. Your body can become dependent on this medicine. Ask your doctor or health care professional if you still need to take it. You may get drowsy or dizzy. Do not drive, use machinery, or do anything that needs mental alertness until you know how this medicine affects you. To reduce the risk of dizzy and fainting spells, do not stand or sit up quickly, especially if you are an older patient. Alcohol may increase dizziness and drowsiness. Avoid alcoholic drinks. Do not treat yourself for coughs, colds or allergies without asking your doctor or health care professional for advice. Some ingredients can increase possible side effects. What side effects may I notice from receiving this medicine? Side effects that you should report to your   doctor or health care professional as  soon as possible: -allergic reactions like skin rash, itching or hives, swelling of the face, lips, or tongue -confusion, forgetfulness -depression -difficulty sleeping -difficulty speaking -feeling faint or lightheaded, falls -mood changes, excitability or aggressive behavior -muscle cramps -trouble passing urine or change in the amount of urine -unusually weak or tired Side effects that usually do not require medical attention (report to your doctor or health care professional if they continue or are bothersome): -change in sex drive or performance -changes in appetite This list may not describe all possible side effects. Call your doctor for medical advice about side effects. You may report side effects to FDA at 1-800-FDA-1088. Where should I keep my medicine? Keep out of the reach of children. This medicine can be abused. Keep your medicine in a safe place to protect it from theft. Do not share this medicine with anyone. Selling or giving away this medicine is dangerous and against the law. Store at room temperature between 20 and 25 degrees C (68 and 77 degrees F). This medicine may cause accidental overdose and death if taken by other adults, children, or pets. Mix any unused medicine with a substance like cat litter or coffee grounds. Then throw the medicine away in a sealed container like a sealed bag or a coffee can with a lid. Do not use the medicine after the expiration date. NOTE: This sheet is a summary. It may not cover all possible information. If you have questions about this medicine, talk to your doctor, pharmacist, or health care provider.    2016, Elsevier/Gold Standard. (2013-11-02 14:51:36) Myofascial Pain Syndrome and Fibromyalgia Myofascial pain syndrome and fibromyalgia are both pain disorders. This pain may be felt mainly in your muscles.   Myofascial pain syndrome:  Always has trigger points or tender points in the muscle that will cause pain when pressed. The  pain may come and go.  Usually affects your neck, upper back, and shoulder areas. The pain often radiates into your arms and hands.  Fibromyalgia:  Has muscle pains and tenderness that come and go.  Is often associated with fatigue and sleep disturbances.  Has trigger points.  Tends to be long-lasting (chronic), but is not life-threatening. Fibromyalgia and myofascial pain are not the same. However, they often occur together. If you have both conditions, each can make the other worse. Both are common and can cause enough pain and fatigue to make day-to-day activities difficult.  CAUSES  The exact causes of fibromyalgia and myofascial pain are not known. People with certain gene types may be more likely to develop fibromyalgia. Some factors can be triggers for both conditions, such as:   Spine disorders.  Arthritis.  Severe injury (trauma) and other physical stressors.  Being under a lot of stress.  A medical illness. SIGNS AND SYMPTOMS  Fibromyalgia The main symptom of fibromyalgia is widespread pain and tenderness in your muscles. This can vary over time. Pain is sometimes described as stabbing, shooting, or burning. You may have tingling or numbness, too. You may also have sleep problems and fatigue. You may wake up feeling tired and groggy (fibro fog). Other symptoms may include:   Bowel and bladder problems.  Headaches.  Visual problems.  Problems with odors and noises.  Depression or mood changes.  Painful menstrual periods (dysmenorrhea).  Dry skin or eyes. Myofascial pain syndrome Symptoms of myofascial pain syndrome include:   Tight, ropy bands of muscle.   Uncomfortable sensations in muscular areas, such  as:  Aching.  Cramping.  Burning.  Numbness.  Tingling.   Muscle weakness.  Trouble moving certain muscles freely (range of motion). DIAGNOSIS  There are no specific tests to diagnose fibromyalgia or myofascial pain syndrome. Both can be hard  to diagnose because their symptoms are common in many other conditions. Your health care provider may suspect one or both of these conditions based on your symptoms and medical history. Your health care provider will also do a physical exam.  The key to diagnosing fibromyalgia is having pain, fatigue, and other symptoms for more than three months that cannot be explained by another condition.  The key to diagnosing myofascial pain syndrome is finding trigger points in muscles that are tender and cause pain elsewhere in your body (referred pain). TREATMENT  Treating fibromyalgia and myofascial pain often requires a team of health care providers. This usually starts with your primary provider and a physical therapist. You may also find it helpful to work with alternative health care providers, such as massage therapists or acupuncturists. Treatment for fibromyalgia may include medicines. This may include nonsteroidal anti-inflammatory drugs (NSAIDs), along with other medicines.  Treatment for myofascial pain may also include:  NSAIDs.  Cooling and stretching of muscles.  Trigger point injections.  Sound wave (ultrasound) treatments to stimulate muscles. HOME CARE INSTRUCTIONS   Take medicines only as directed by your health care provider.  Exercise as directed by your health care provider or physical therapist.  Try to avoid stressful situations.  Practice relaxation techniques to control your stress. You may want to try:  Biofeedback.  Visual imagery.  Hypnosis.  Muscle relaxation.  Yoga.  Meditation.  Talk to your health care provider about alternative treatments, such as acupuncture or massage treatment.  Maintain a healthy lifestyle. This includes eating a healthy diet and getting enough sleep.  Consider joining a support group.  Do not do activities that stress or strain your muscles. That includes repetitive motions and heavy lifting. SEEK MEDICAL CARE IF:   You have  new symptoms.  Your symptoms get worse.  You have side effects from your medicines.  You have trouble sleeping.  Your condition is causing depression or anxiety. FOR MORE INFORMATION   National Fibromyalgia Association: http://www.fmaware.orgwww.fmaware.org  Arthritis Foundation: http://www.arthritis.orgwww.arthritis.org  American Chronic Pain Association: michaeledo.com.CandyDash.co.za   This information is not intended to replace advice given to you by your health care provider. Make sure you discuss any questions you have with your health care provider.   Document Released: 02/11/2005 Document Revised: 03/04/2014 Document Reviewed: 11/17/2013 Elsevier Interactive Patient Education [employer]

## 2015-05-04 NOTE — Progress Notes (Signed)
Patient: Sophia LyonRebecca M Hall Female    DOB: 1981/02/23   35 y.o.   MRN: [id number] Visit Date: 05/04/2015  Today's Provider: Margaretann LovelessJennifer M Rosalva Neary, PA-C   Chief Complaint  Patient presents with  . Follow-up    Anxiety and Fibromyalgia   Subjective:    HPI Anxiety: Patient is here her 4 week follow-up Generalized Anxiety Disorder. She has the following symptoms: none. She denies current suicidal and homicidal ideation. Family history significant for anxiety and heart disease.Risk factors: positive family history in  father and motherTreatment includes Xanax 0.5mg , she is only taking the Xanax at bed time.In the last 30 days she has taken the Xanax 5 times during the day . She complains of the following side effects from the treatment: none.  Fibromyalgia: She is here for 4 week follow-up. She is hiking every single weekend. She is aching in her lower back through her hips and lower extremities bilateral. She is feeling much better now that she is on the Xanax. She is also concern that she might be passing a kidney stone. She was in a lot of pain last night. She increased her fluid. She does have a history of Kidney Stones.    Allergies  Allergen Reactions  . Compazine Other (See Comments)    dystonia  . Metoclopramide Hcl Other (See Comments)    dystonia   Previous Medications   ALPRAZOLAM (XANAX) 0.5 MG TABLET    Take 1 tablet (0.5 mg total) by mouth at bedtime as needed for anxiety. May take 1/2 tab to 1 tab BID prn through day for panic attacks   CHOLECALCIFEROL (VITAMIN D-3 PO)    Take 1 tablet by mouth daily.   LUBIPROSTONE (AMITIZA) 24 MCG CAPSULE    Take 1 capsule (24 mcg total) by mouth 2 (two) times daily with a meal.   MULTIPLE VITAMIN (MULTIVITAMIN WITH MINERALS) TABS TABLET    Take 1 tablet by mouth daily.   OMEPRAZOLE (PRILOSEC) 40 MG CAPSULE    Take 1 capsule (40 mg total) by mouth daily.   PROMETHAZINE (PHENERGAN) 25 MG TABLET    Take 1 tablet (25 mg total) by mouth  every 6 (six) hours as needed for nausea or vomiting.   RELPAX 40 MG TABLET    TAKE 1 TABLET BY MOUTH DAILY AS NEEDED FOR MIGRAINE OR HEADACHE,MAY REPEAT IN 2 HOURS   TOPIRAMATE (TOPAMAX) 100 MG TABLET    Take 2 tablets (200 mg total) by mouth daily.   VALACYCLOVIR (VALTREX) 500 MG TABLET    Take 1-2 tablets (500-1,000 mg total) by mouth 2 (two) times daily as needed.    Review of Systems  Constitutional: Negative for fatigue.  Respiratory: Negative.   Cardiovascular: Negative.   Gastrointestinal: Negative.   Musculoskeletal: Positive for myalgias and back pain.  Neurological: Negative.   Psychiatric/Behavioral: Positive for sleep disturbance (Just last night. Most of the time she is sleeping well.). Negative for decreased concentration. The patient is not nervous/anxious and is not hyperactive.     Social History  Substance Use Topics  . Smoking status: Former Smoker -- 0.50 packs/day for 15 years    Types: Cigarettes    Start date: 11/03/1999    Quit date: 09/26/2014  . Smokeless tobacco: Never Used  . Alcohol Use: 0.0 oz/week    0 Standard drinks or equivalent per week     Comment: social-rare   Objective:   BP 112/78 mmHg  Pulse 66  Temp(Src) 98.2  F (36.8 C) (Oral)  Resp 16  Wt 180 lb (81.647 kg)  Physical Exam  Constitutional: She appears well-developed and well-nourished. No distress.  Cardiovascular: Normal rate, regular rhythm and normal heart sounds.  Exam reveals no gallop and no friction rub.   No murmur heard. Pulmonary/Chest: Effort normal and breath sounds normal. No respiratory distress. She has no wheezes. She has no rales.  Skin: She is not diaphoretic.  Psychiatric: She has a normal mood and affect. Her behavior is normal. Judgment and thought content normal.  Vitals reviewed.       Assessment & Plan:     1. Generalized anxiety disorder Symptoms are improving especially since she is sleeping better. I will refill the Xanax as below and she has been  taking it. I will see her back in 3 months for her complete annual physical. She is to call the office if she has any worsening symptoms, acute issues, questions or concerns in the meantime. - ALPRAZolam (XANAX) 1 MG tablet; Take 1 tab PO q h.s and 1/2 - 1 tab PO during the day prn anxiety  Dispense: 45 tablet; Refill: 1  2. History of kidney stones UA did have large hematuria that was negative for urinary tract infection. The pain that she had last night was most likely secondary to a kidney stone. She is to call if symptoms return, pain worsens or if she develops inability to pass urine. - POCT urinalysis dipstick       [name], PA-C  [facility]

## 2015-05-06 ENCOUNTER — Other Ambulatory Visit: Payer: Self-pay | Admitting: Family Medicine

## 2015-05-10 ENCOUNTER — Other Ambulatory Visit: Payer: Self-pay | Admitting: Family Medicine

## 2015-05-15 ENCOUNTER — Emergency Department (HOSPITAL_COMMUNITY)
Admission: EM | Admit: 2015-05-15 | Discharge: 2015-05-15 | Disposition: A | Discharge status: Home / Self Care | Payer: BLUE CROSS/BLUE SHIELD | Attending: Emergency Medicine | Admitting: Emergency Medicine

## 2015-05-15 ENCOUNTER — Emergency Department (HOSPITAL_COMMUNITY): Payer: BLUE CROSS/BLUE SHIELD

## 2015-05-15 ENCOUNTER — Encounter (HOSPITAL_COMMUNITY): Payer: Self-pay | Admitting: Emergency Medicine

## 2015-05-15 DIAGNOSIS — Z79899 Other long term (current) drug therapy: Secondary | ICD-10-CM | POA: Insufficient documentation

## 2015-05-15 DIAGNOSIS — Z87442 Personal history of urinary calculi: Secondary | ICD-10-CM | POA: Diagnosis not present

## 2015-05-15 DIAGNOSIS — G43909 Migraine, unspecified, not intractable, without status migrainosus: Secondary | ICD-10-CM | POA: Diagnosis not present

## 2015-05-15 DIAGNOSIS — J45909 Unspecified asthma, uncomplicated: Secondary | ICD-10-CM | POA: Insufficient documentation

## 2015-05-15 DIAGNOSIS — Z87448 Personal history of other diseases of urinary system: Secondary | ICD-10-CM | POA: Insufficient documentation

## 2015-05-15 DIAGNOSIS — R1032 Left lower quadrant pain: Secondary | ICD-10-CM | POA: Diagnosis present

## 2015-05-15 DIAGNOSIS — B9689 Other specified bacterial agents as the cause of diseases classified elsewhere: Secondary | ICD-10-CM

## 2015-05-15 DIAGNOSIS — Z87891 Personal history of nicotine dependence: Secondary | ICD-10-CM | POA: Diagnosis not present

## 2015-05-15 DIAGNOSIS — N760 Acute vaginitis: Secondary | ICD-10-CM | POA: Diagnosis not present

## 2015-05-15 LAB — COMPREHENSIVE METABOLIC PANEL
ALT: 11 U/L — ABNORMAL LOW (ref 14–54)
AST: 15 U/L (ref 15–41)
Albumin: 3.8 g/dL (ref 3.5–5.0)
Alkaline Phosphatase: 64 U/L (ref 38–126)
Anion gap: 8 (ref 5–15)
BUN: 14 mg/dL (ref 6–20)
CO2: 23 mmol/L (ref 22–32)
Calcium: 9.3 mg/dL (ref 8.9–10.3)
Chloride: 112 mmol/L — ABNORMAL HIGH (ref 101–111)
Creatinine, Ser: 0.96 mg/dL (ref 0.44–1.00)
GFR calc Af Amer: >60 mL/min (ref >60)
GFR calc non Af Amer: >60 mL/min (ref >60)
Glucose, Bld: 87 mg/dL (ref 65–99)
Potassium: 3.9 mmol/L (ref 3.5–5.1)
Sodium: 143 mmol/L (ref 135–145)
Total Bilirubin: 0.5 mg/dL (ref 0.3–1.2)
Total Protein: 6.8 g/dL (ref 6.5–8.1)

## 2015-05-15 LAB — CBC
HCT: 40.5 % (ref 36.0–46.0)
Hemoglobin: 13.6 g/dL (ref 12.0–15.0)
MCH: 31.9 pg (ref 26.0–34.0)
MCHC: 33.6 g/dL (ref 30.0–36.0)
MCV: 95.1 fL (ref 78.0–100.0)
Platelets: 218 10*3/uL (ref 150–400)
RBC: 4.26 MIL/uL (ref 3.87–5.11)
RDW: 13.3 % (ref 11.5–15.5)
WBC: 14.5 10*3/uL — ABNORMAL HIGH (ref 4.0–10.5)

## 2015-05-15 LAB — URINALYSIS, ROUTINE W REFLEX MICROSCOPIC
Bilirubin Urine: NEGATIVE
Glucose, UA: NEGATIVE mg/dL
Hgb urine dipstick: NEGATIVE
Ketones, ur: NEGATIVE mg/dL
Leukocytes, UA: NEGATIVE
Nitrite: NEGATIVE
Protein, ur: NEGATIVE mg/dL
Specific Gravity, Urine: 1.021 (ref 1.005–1.030)
pH: 5.5 (ref 5.0–8.0)

## 2015-05-15 LAB — WET PREP, GENITAL
Sperm: NONE SEEN
Trich, Wet Prep: NONE SEEN
Yeast Wet Prep HPF POC: NONE SEEN

## 2015-05-15 LAB — GC/CHLAMYDIA PROBE AMP (~~LOC~~) NOT AT ARMC
Chlamydia: NEGATIVE
Neisseria Gonorrhea: NEGATIVE

## 2015-05-15 LAB — LIPASE, BLOOD: Lipase: 41 U/L (ref 11–51)

## 2015-05-15 MED ORDER — MORPHINE SULFATE (PF) 4 MG/ML IV SOLN
4.0000 mg | Freq: Once | INTRAVENOUS | Status: AC
  Administered 2015-05-15: 4 mg via INTRAVENOUS
  Filled 2015-05-15: qty 1

## 2015-05-15 MED ORDER — ONDANSETRON HCL 4 MG/2ML IJ SOLN
4.0000 mg | Freq: Once | INTRAMUSCULAR | Status: AC
  Administered 2015-05-15: 4 mg via INTRAVENOUS
  Filled 2015-05-15: qty 2

## 2015-05-15 MED ORDER — METRONIDAZOLE 500 MG PO TABS: 500.0000 mg | ORAL_TABLET | Freq: Two times a day (BID) | ORAL | Status: DC

## 2015-05-15 MED ORDER — HYDROMORPHONE HCL 1 MG/ML IJ SOLN
0.5000 mg | Freq: Once | INTRAMUSCULAR | Status: AC
  Administered 2015-05-15: 0.5 mg via INTRAVENOUS
  Filled 2015-05-15: qty 1

## 2015-05-15 MED ORDER — KETOROLAC TROMETHAMINE 30 MG/ML IJ SOLN
30.0000 mg | Freq: Once | INTRAMUSCULAR | Status: AC
  Administered 2015-05-15: 30 mg via INTRAVENOUS
  Filled 2015-05-15: qty 1

## 2015-05-15 MED ORDER — IOHEXOL 300 MG/ML  SOLN
100.0000 mL | Freq: Once | INTRAMUSCULAR | Status: AC | PRN
  Administered 2015-05-15: 100 mL via INTRAVENOUS

## 2015-05-15 NOTE — Discharge Instructions (Signed)
Take 4 over the counter ibuprofen tablets 3 times a day or 2 over-the-counter naproxen tablets twice a day for pain.  Bacterial Vaginosis Bacterial vaginosis is a vaginal infection that occurs when the normal balance of bacteria in the vagina is disrupted. It results from an overgrowth of certain bacteria. This is the most common vaginal infection in women of childbearing age. Treatment is important to prevent complications, especially in pregnant women, as it can cause a premature delivery. CAUSES  Bacterial vaginosis is caused by an increase in harmful bacteria that are normally present in smaller amounts in the vagina. Several different kinds of bacteria can cause bacterial vaginosis. However, the reason that the condition develops is not fully understood. RISK FACTORS Certain activities or behaviors can put you at an increased risk of developing bacterial vaginosis, including:  Having a new sex partner or multiple sex partners.  Douching.  Using an intrauterine device (IUD) for contraception. Women do not get bacterial vaginosis from toilet seats, bedding, swimming pools, or contact with objects around them. SIGNS AND SYMPTOMS  Some women with bacterial vaginosis have no signs or symptoms. Common symptoms include:  Grey vaginal discharge.  A fishlike odor with discharge, especially after sexual intercourse.  Itching or burning of the vagina and vulva.  Burning or pain with urination. DIAGNOSIS  Your health care provider will take a medical history and examine the vagina for signs of bacterial vaginosis. A sample of vaginal fluid may be taken. Your health care provider will look at this sample under a microscope to check for bacteria and abnormal cells. A vaginal pH test may also be done.  TREATMENT  Bacterial vaginosis may be treated with antibiotic medicines. These may be given in the form of a pill or a vaginal cream. A second round of antibiotics may be prescribed if the condition  comes back after treatment. Because bacterial vaginosis increases your risk for sexually transmitted diseases, getting treated can help reduce your risk for chlamydia, gonorrhea, HIV, and herpes. HOME CARE INSTRUCTIONS   Only take over-the-counter or prescription medicines as directed by your health care provider.  If antibiotic medicine was prescribed, take it as directed. Make sure you finish it even if you start to feel better.  Tell all sexual partners that you have a vaginal infection. They should see their health care provider and be treated if they have problems, such as a mild rash or itching.  During treatment, it is important that you follow these instructions:  Avoid sexual activity or use condoms correctly.  Do not douche.  Avoid alcohol as directed by your health care provider.  Avoid breastfeeding as directed by your health care provider. SEEK MEDICAL CARE IF:   Your symptoms are not improving after 3 days of treatment.  You have increased discharge or pain.  You have a fever. MAKE SURE YOU:   Understand these instructions.  Will watch your condition.  Will get help right away if you are not doing well or get worse. FOR MORE INFORMATION  Centers for Disease Control and Prevention, Division of STD Prevention: SolutionApps.co.zawww.cdc.gov/std American Sexual Health Association (ASHA): www.ashastd.org    This information is not intended to replace advice given to you by your health care provider. Make sure you discuss any questions you have with your health care provider.   Document Released: 02/11/2005 Document Revised: 03/04/2014 Document Reviewed: 09/23/2012 Elsevier Interactive Patient Education [employer]2016 Elsevier Inc.

## 2015-05-15 NOTE — ED Notes (Signed)
Patient her with complaint of LLQ abdominal pain. Onset yesterday. States history of hysterectomy with ovaries remaining, recent diagnosis of IBS, and kidney stones last November. Endorses 2 episodes of emesis. States mild fever at home to 100F. Percussion of back exacerbates LLQ pain. Pain has been constant and intense.

## 2015-05-15 NOTE — ED Notes (Signed)
Pt. Stating severe umbilical pain after administration of toradol, morphine, and zofran. EDP notified and assessed patient. EDP ordered 0.5 hydromorphone at this time.

## 2015-05-15 NOTE — ED Notes (Signed)
EDP at bedside  

## 2015-05-15 NOTE — ED Provider Notes (Signed)
CSN: [id number]     Arrival date & time 05/15/15  0555 History   First MD Initiated Contact with Patient 05/15/15 [phone]     Chief Complaint  Patient presents with  . Abdominal Pain     (Consider location/radiation/quality/duration/timing/severity/associated sxs/prior Treatment) Patient is a 35 y.o. female presenting with abdominal pain.  Abdominal Pain Pain location:  LLQ Pain quality: sharp and shooting   Pain radiates to:  Does not radiate Pain severity:  Moderate Onset quality:  Sudden Duration:  2 days Timing:  Constant Progression:  Worsening Chronicity:  New Relieved by:  Nothing Worsened by:  Nothing tried Ineffective treatments:  None tried Associated symptoms: no chest pain, no chills, no dysuria, no fever, no nausea, no shortness of breath and no vomiting     35 yo F with a chief complaint of left lower quadrant abdominal pain. This been going on for about 3 days. Feels a little bit like her irritable bowel, however also sounds like her prior renal stones, and like her prior ovarian cysts. Constant pain since last night.  Denies relation to bowel or bladder.  Denies fevers, nausea, vomiting.   Past Medical History  Diagnosis Date  . Migraine   . Gallstones   . Kidney stones     bilateral  . Acute pyelonephritis 06/29/2012  . Pyelonephritis 2015  . Anxiety   . Asthma   . Pelvic pain in female   . Renal mass     right  . Tobacco abuse    Past Surgical History  Procedure Laterality Date  . Abdominal hysterectomy  2006  . Tonsillectomy    . Lithotripsy      x 4  . Cholecystectomy  03/11/2011    Procedure: LAPAROSCOPIC CHOLECYSTECTOMY WITH INTRAOPERATIVE CHOLANGIOGRAM;  Surgeon: Atilano InaEric M Wilson, MD;  [facility];  Service: General;  Laterality: N/A;  . Cholecystectomy, laparoscopic    . Orif ankle fracture  12/25/2011    Procedure: OPEN REDUCTION INTERNAL FIXATION (ORIF) ANKLE FRACTURE;  Surgeon: Javier DockerJeffrey C Beane, MD;  Location: [facility] ORS;  Service: Orthopedics;   Laterality: Left;  . Cast application  12/25/2011    Procedure: CAST APPLICATION;  Surgeon: Javier DockerJeffrey C Beane, MD;  Location: [facility] ORS;  Service: Orthopedics;  Laterality: Left;  . Appendectomy  07/26/13   Family History  Problem Relation Age of Onset  . Diabetes Mother   . Diabetes Father   . Heart failure Father   . Hypertension Father   . Pulmonary fibrosis Father   . Nephrolithiasis Father    Social History  Substance Use Topics  . Smoking status: Former Smoker -- 0.50 packs/day for 15 years    Types: Cigarettes    Start date: 11/03/1999    Quit date: 09/26/2014  . Smokeless tobacco: Never Used  . Alcohol Use: 0.0 oz/week    0 Standard drinks or equivalent per week     Comment: social-rare   OB History    Gravida Para Term Preterm AB TAB SAB Ectopic Multiple Living   2 2        2       Obstetric Comments   1st Menstrual Cycle:  10 1st Pregnancy:  17     Review of Systems  Constitutional: Negative for fever and chills.  HENT: Negative for congestion and rhinorrhea.   Eyes: Negative for redness and visual disturbance.  Respiratory: Negative for shortness of breath and wheezing.   Cardiovascular: Negative for chest pain and palpitations.  Gastrointestinal: Positive for abdominal  pain. Negative for nausea and vomiting.  Genitourinary: Negative for dysuria and urgency.  Musculoskeletal: Negative for myalgias and arthralgias.  Skin: Negative for pallor and wound.  Neurological: Negative for dizziness and headaches.      Allergies  Compazine and Metoclopramide hcl  Home Medications   Prior to Admission medications   Medication Sig Start Date End Date Taking? Authorizing Provider  ALPRAZolam Prudy Feeler) 1 MG tablet Take 1 tab PO q h.s and 1/2 - 1 tab PO during the day prn anxiety 05/04/15  Yes Alessandra Bevels Burnette, PA-C  Cholecalciferol (VITAMIN D-3 PO) Take 1 tablet by mouth daily.   Yes Historical Provider, MD  Multiple Vitamin (MULTIVITAMIN WITH MINERALS) TABS tablet Take 1  tablet by mouth daily.   Yes Historical Provider, MD  omeprazole (PRILOSEC) 40 MG capsule TAKE 1 CAPSULE (40 MG TOTAL) BY MOUTH DAILY. 05/10/15  Yes Alba Cory, MD  promethazine (PHENERGAN) 25 MG tablet Take 1 tablet (25 mg total) by mouth every 6 (six) hours as needed for nausea or vomiting. 11/03/14  Yes Alba Cory, MD  RELPAX 40 MG tablet TAKE 1 TABLET BY MOUTH DAILY AS NEEDED FOR MIGRAINE OR HEADACHE,MAY REPEAT IN 2 HOURS 05/01/15  Yes Alessandra Bevels Burnette, PA-C  topiramate (TOPAMAX) 100 MG tablet TAKE 2 TABLETS (200 MG TOTAL) BY MOUTH DAILY. 05/06/15  Yes Alba Cory, MD  valACYclovir (VALTREX) 500 MG tablet Take 1-2 tablets (500-1,000 mg total) by mouth 2 (two) times daily as needed. Patient taking differently: Take 500-1,000 mg by mouth 2 (two) times daily as needed (cold sores).  02/02/15  Yes Alba Cory, MD  metroNIDAZOLE (FLAGYL) 500 MG tablet Take 1 tablet (500 mg total) by mouth 2 (two) times daily. 05/15/15   Melene Plan, DO   BP 97/62 mmHg  Pulse 78  Temp(Src) 98 F (36.7 C) (Oral)  Resp 16  Ht  (1.702 m)  Wt 175 lb (79.379 kg)  BMI 27.40 kg/m2  SpO2 99% Physical Exam  Constitutional: She is oriented to person, place, and time. She appears well-developed and well-nourished. No distress.  HENT:  Head: Normocephalic and atraumatic.  Eyes: EOM are normal. Pupils are equal, round, and reactive to light.  Neck: Normal range of motion. Neck supple.  Cardiovascular: Normal rate and regular rhythm.  Exam reveals no gallop and no friction rub.   No murmur heard. Pulmonary/Chest: Effort normal. She has no wheezes. She has no rales.  Abdominal: Soft. She exhibits no distension. There is no tenderness.  Genitourinary: Right adnexum displays no mass, no tenderness and no fullness. Left adnexum displays no mass, no tenderness and no fullness.  Musculoskeletal: She exhibits no edema or tenderness.  Neurological: She is alert and oriented to person, place, and time.  Skin: Skin  is warm and dry. She is not diaphoretic.  Psychiatric: She has a normal mood and affect. Her behavior is normal.  Nursing note and vitals reviewed.   ED Course  Procedures (including critical care time) Labs Review Labs Reviewed  WET PREP, GENITAL - Abnormal; Notable for the following:    Clue Cells Wet Prep HPF POC PRESENT (*)    WBC, Wet Prep HPF POC FEW (*)    All other components within normal limits  COMPREHENSIVE METABOLIC PANEL - Abnormal; Notable for the following:    Chloride 112 (*)    ALT 11 (*)    All other components within normal limits  CBC - Abnormal; Notable for the following:    WBC 14.5 (*)  All other components within normal limits  LIPASE, BLOOD  URINALYSIS, ROUTINE W REFLEX MICROSCOPIC (NOT AT [facility])  GC/CHLAMYDIA PROBE AMP ([place]) NOT AT [facility]    Imaging Review Ct Abdomen Pelvis W Contrast  05/15/2015  CLINICAL DATA:  Left lower quadrant pain.  Fever. EXAM: CT ABDOMEN AND PELVIS WITH CONTRAST TECHNIQUE: Multidetector CT imaging of the abdomen and pelvis was performed using the standard protocol following bolus administration of intravenous contrast. CONTRAST:  OMNIPAQUE IOHEXOL 300 MG/ML  SOLN COMPARISON:  CT scans dated 10/10/2014 and 12/02/2013 FINDINGS: Lower chest:  Normal. Hepatobiliary: The liver is normal. Gallbladder has been removed. Bile ducts are normal. Pancreas: Normal. Spleen: Normal. Adrenals/Urinary Tract: Normal adrenal glands. 5 mm stone in the lower pole of the left kidney. 2 mm stone in the lower pole the right kidney. No hydronephrosis. Ureters and bladder are normal. Stomach/Bowel: Normal.  Appendix has been removed. Vascular/Lymphatic: Normal. Reproductive: Uterus has been removed. 11 mm cyst on the right ovary. The ovaries are otherwise normal. Other: No free air or free fluid. Musculoskeletal: Negative. IMPRESSION: No acute abnormalities.  Small stable bilateral renal calculi. Electronically Signed   By: Francene Boyers M.D.   On:  05/15/2015 09:27   I have personally reviewed and evaluated these images and lab results as part of my medical decision-making.   EKG Interpretation None      MDM   Final diagnoses:  LLQ abdominal pain  BV (bacterial vaginosis)    35 yo F with a chief complaint of left lower quadrant abdominal pain. CT scan was negative for acute pathology. No noted ovarian tenderness on exam feel that ovarian pathology is unlikely. Discharge home.  3:11 PM:  I have discussed the diagnosis/risks/treatment options with the patient and believe the pt to be eligible for discharge home to follow-up with PCP. We also discussed returning to the ED immediately if new or worsening sx occur. We discussed the sx which are most concerning (e.g., sudden worsening pain, fever, inability to tolerate by mouth) that necessitate immediate return. Medications administered to the patient during their visit and any new prescriptions provided to the patient are listed below.  Medications given during this visit Medications  morphine 4 MG/ML injection 4 mg (4 mg Intravenous Given 05/15/15 0817)  ondansetron (ZOFRAN) injection 4 mg (4 mg Intravenous Given 05/15/15 0817)  ketorolac (TORADOL) 30 MG/ML injection 30 mg (30 mg Intravenous Given 05/15/15 0817)  HYDROmorphone (DILAUDID) injection 0.5 mg (0.5 mg Intravenous Given 05/15/15 0846)  iohexol (OMNIPAQUE) 300 MG/ML solution 100 mL (100 mLs Intravenous Contrast Given 05/15/15 0856)    Discharge Medication List as of 05/15/2015  9:51 AM    START taking these medications   Details  metroNIDAZOLE (FLAGYL) 500 MG tablet Take 1 tablet (500 mg total) by mouth 2 (two) times daily., Starting 05/15/2015, Until Discontinued, Print        The patient appears reasonably screen and/or stabilized for discharge and I doubt any other medical condition or other [facility] requiring further screening, evaluation, or treatment in the ED at this time prior to discharge.      Melene Plan,  DO 05/15/15 1511

## 2015-05-29 ENCOUNTER — Telehealth: Payer: Self-pay | Admitting: Physician Assistant

## 2015-05-29 NOTE — Telephone Encounter (Signed)
Patient advised as directed below. Patient is not currently having a migraine. Patient states she has been taking the same medication for years, and has had the same insurance for a few years. Patient wanted to know if the quantity could be the reason that insurance is requiring a PA. [facility] insurance will not pay for any until 06/02/2015 unless a PA is done because patient is requesting refill to soon. Patient advised as directed above. Patient verbalized understanding and states she will request a refill on 06/02/2015.

## 2015-05-29 NOTE — Telephone Encounter (Signed)
Normally this can take 1-2 weeks depending on insurance response. We do not have any refills to give in the meantime because I checked. Is she having a migraine now?

## 2015-05-29 NOTE — Telephone Encounter (Signed)
Any suggestions for patient while waiting on PA to be completed? Please advise.

## 2015-05-29 NOTE — Telephone Encounter (Signed)
LMTCB

## 2015-05-29 NOTE — Telephone Encounter (Signed)
Pt states per her pharmacy the Rx for RELPAX 40 MG tablet is needing a prior authorization.  Pt is stating she completely out of medication and is asking when this will be resolved.  [facility]  ZO#[phone]/WJCB#[id number]/MW

## 2015-06-02 ENCOUNTER — Telehealth: Payer: Self-pay | Admitting: Physician Assistant

## 2015-06-08 ENCOUNTER — Ambulatory Visit: Payer: BLUE CROSS/BLUE SHIELD | Admitting: Gastroenterology

## 2015-06-12 ENCOUNTER — Telehealth: Payer: Self-pay | Admitting: Physician Assistant

## 2015-06-12 NOTE — Telephone Encounter (Signed)
Pt states per the pharmacy pt will need a prior auth for the Rx RELPAX 40 MG tablet.  Pt states she has been without this for 2 weeks.  ZO#[phone]/WJCB#[phone]/MW

## 2015-06-14 ENCOUNTER — Other Ambulatory Visit: Payer: Self-pay | Admitting: Physician Assistant

## 2015-06-14 DIAGNOSIS — G43109 Migraine with aura, not intractable, without status migrainosus: Secondary | ICD-10-CM

## 2015-06-14 MED ORDER — ELETRIPTAN HYDROBROMIDE 40 MG PO TABS: ORAL_TABLET | ORAL | Status: DC

## 2015-06-14 NOTE — Progress Notes (Signed)
Prescription called in to [facility] [address].  Thanks,  -Rawleigh Rode 

## 2015-06-14 NOTE — Progress Notes (Signed)
Can we please call in relpax Rx as below:  eletriptan 40mg   May take 1 tab PO for migraine. May repeat in 2 hours if needed.  #6 with 6 refills This will be a 90 day supply Per PA quantity limit restrictions. Thank you!

## 2015-07-10 ENCOUNTER — Other Ambulatory Visit: Payer: Self-pay | Admitting: Physician Assistant

## 2015-07-10 DIAGNOSIS — G4700 Insomnia, unspecified: Secondary | ICD-10-CM

## 2015-07-11 NOTE — Telephone Encounter (Signed)
Called prescription in left it in there voicemail due to pharmacist being in a conference call.  Thanks,  -Joseline

## 2015-08-03 ENCOUNTER — Ambulatory Visit (INDEPENDENT_AMBULATORY_CARE_PROVIDER_SITE_OTHER): Payer: BLUE CROSS/BLUE SHIELD | Admitting: Physician Assistant

## 2015-08-03 ENCOUNTER — Encounter: Payer: Self-pay | Admitting: Physician Assistant

## 2015-08-03 VITALS — BP 100/60 | HR 70 | Temp 98.1°F | Resp 16 | Ht 67.0 in | Wt 181.2 lb

## 2015-08-03 DIAGNOSIS — E559 Vitamin D deficiency, unspecified: Secondary | ICD-10-CM | POA: Diagnosis not present

## 2015-08-03 DIAGNOSIS — K589 Irritable bowel syndrome without diarrhea: Secondary | ICD-10-CM | POA: Diagnosis not present

## 2015-08-03 DIAGNOSIS — K5900 Constipation, unspecified: Secondary | ICD-10-CM

## 2015-08-03 DIAGNOSIS — Z0000 Encounter for general adult medical examination without abnormal findings: Secondary | ICD-10-CM | POA: Diagnosis not present

## 2015-08-03 DIAGNOSIS — Z1239 Encounter for other screening for malignant neoplasm of breast: Secondary | ICD-10-CM

## 2015-08-03 DIAGNOSIS — Z13220 Encounter for screening for lipoid disorders: Secondary | ICD-10-CM | POA: Diagnosis not present

## 2015-08-03 DIAGNOSIS — G43009 Migraine without aura, not intractable, without status migrainosus: Secondary | ICD-10-CM | POA: Diagnosis not present

## 2015-08-03 DIAGNOSIS — Z136 Encounter for screening for cardiovascular disorders: Secondary | ICD-10-CM

## 2015-08-03 DIAGNOSIS — K5909 Other constipation: Secondary | ICD-10-CM

## 2015-08-03 MED ORDER — TOPIRAMATE 100 MG PO TABS: ORAL_TABLET | ORAL | Status: DC

## 2015-08-03 MED ORDER — LINACLOTIDE 72 MCG PO CAPS: 72.0000 ug | ORAL_CAPSULE | Freq: Every day | ORAL | Status: DC

## 2015-08-03 NOTE — Progress Notes (Signed)
Patient: [name], Female    DOB: 1980-10-04, 35 y.o.   MRN: [id number] Visit Date: 08/03/2015  Today's Provider: [name], PA-C   Chief Complaint  Patient presents with  . Annual Exam   Subjective:    Annual physical exam [name] is a 35 y.o. female who presents today for health maintenance and complete physical. She feels well. She reports exercising on the weekend-cardio/hiking. She reports she is sleeping well, takes the Xanax as needed to help her sleep.  Migraines have good and bad weeks. She states she had went almost 6 weeks without one and last week she had one about 4 days straight out of the week.  Also she reports she has been doing well with the lower dose of Linzess with her chronic constipation.  -----------------------------------------------------------------   Review of Systems  Constitutional: Negative.   Eyes: Negative.   Respiratory: Negative.   Cardiovascular: Negative.   Gastrointestinal: Positive for constipation and blood in stool.  Endocrine: Positive for cold intolerance.  Genitourinary: Negative.   Musculoskeletal: Positive for myalgias, back pain and arthralgias.  Skin: Negative.   Allergic/Immunologic: Negative.   Neurological: Positive for dizziness and headaches.  Hematological: Negative.   Psychiatric/Behavioral: The patient is nervous/anxious.     Social History      She  reports that she quit smoking about 10 months ago. Her smoking use included Cigarettes. She started smoking about 15 years ago. She has a 7.5 pack-year smoking history. She has never used smokeless tobacco. She reports that she drinks alcohol. She reports that she does not use illicit drugs.       Social History   Social History  . Marital Status: Married    Spouse Name: N/A  . Number of Children: N/A  . Years of Education: N/A   Social History Main Topics  . Smoking status: Former Smoker -- 0.50 packs/day for 15 years   Types: Cigarettes    Start date: 11/03/1999    Quit date: 09/26/2014  . Smokeless tobacco: Never Used  . Alcohol Use: 0.0 oz/week    0 Standard drinks or equivalent per week     Comment: social-rare  . Drug Use: No  . Sexual Activity: Not Asked   Other Topics Concern  . None   Social History Narrative    Past Medical History  Diagnosis Date  . Migraine   . Gallstones   . Kidney stones     bilateral  . Acute pyelonephritis 06/29/2012  . Pyelonephritis 2015  . Anxiety   . Asthma   . Pelvic pain in female   . Renal mass     right  . Tobacco abuse      Patient Active Problem List   Diagnosis Date Noted  . IBS (irritable bowel syndrome) 02/02/2015  . Herpes simplex 02/02/2015  . Asthma, mild intermittent 11/03/2014  . Migraine without aura or status migrainosus 11/03/2014  . Gastroesophageal reflux disease without esophagitis 11/03/2014  . Generalized anxiety disorder 11/03/2014  . Other and unspecified ovarian cyst 08/06/2013  . Left lower quadrant pain 07/16/2013  . Appendicolith 07/16/2013  . Chronic constipation 06/30/2012  . Gross hematuria 06/30/2012  . History of pyelonephritis 06/29/2012  . Nephrolithiasis 06/28/2012    Past Surgical History  Procedure Laterality Date  . Abdominal hysterectomy  2006  . Tonsillectomy    . Lithotripsy      x 4  . Cholecystectomy  03/11/2011    Procedure:  LAPAROSCOPIC CHOLECYSTECTOMY WITH INTRAOPERATIVE CHOLANGIOGRAM;  Surgeon: Atilano Ina, MD;  Location: [facility] OR;  Service: General;  Laterality: N/A;  . Cholecystectomy, laparoscopic    . Orif ankle fracture  12/25/2011    Procedure: OPEN REDUCTION INTERNAL FIXATION (ORIF) ANKLE FRACTURE;  Surgeon: Javier Docker, MD;  Location: [facility] OR[facility];  Service: Orthopedics;  Laterality: Left;  . Cast application  12/25/2011    Procedure: CAST APPLICATION;  Surgeon: Javier Docker, MD;  Location: [facility];  Service: Orthopedics;  Laterality: Left;  . Appendectomy  07/26/13    Family  History        Family Status  Relation Status Death Age  . Mother Deceased   . Father Deceased   . Sister Alive   . Brother Alive   . Brother Alive   . Brother Alive         Her family history includes Diabetes in her father and mother; Heart failure in her father; Hypertension in her father; Nephrolithiasis in her father; Pulmonary fibrosis in her father.    Allergies  Allergen Reactions  . Compazine Other (See Comments)    dystonia  . Metoclopramide Hcl Other (See Comments)    dystonia    Current Meds  Medication Sig  . ALPRAZolam (XANAX) 1 MG tablet TAKE 1 TABLET BY MOUTH AT BEDTIME AND 1/2 TO 1 TABLET DURING THE DAY AS NEEDED FOR ANXIETY  . Cholecalciferol (VITAMIN D-3 PO) Take 1 tablet by mouth daily.  Marland Kitchen eletriptan (RELPAX) 40 MG tablet TAKE 1 TABLET BY MOUTH DAILY AS NEEDED FOR MIGRAINE OR HEADACHE. MAY REPEAT IN 2 HOURS IF NEEDED.  . Multiple Vitamin (MULTIVITAMIN WITH MINERALS) TABS tablet Take 1 tablet by mouth daily.  Marland Kitchen omeprazole (PRILOSEC) 40 MG capsule TAKE 1 CAPSULE (40 MG TOTAL) BY MOUTH DAILY.  Marland Kitchen promethazine (PHENERGAN) 25 MG tablet Take 1 tablet (25 mg total) by mouth every 6 (six) hours as needed for nausea or vomiting.  . topiramate (TOPAMAX) 100 MG tablet TAKE 2 TABLETS (200 MG TOTAL) BY MOUTH DAILY.  . valACYclovir (VALTREX) 500 MG tablet Take 1-2 tablets (500-1,000 mg total) by mouth 2 (two) times daily as needed. (Patient taking differently: Take 500-1,000 mg by mouth 2 (two) times daily as needed (cold sores). )    Patient Care Team: [name], PA-C as PCP - General (Family Medicine) Alba Cory, MD as Attending Physician (Family Medicine) Earline Mayotte, MD (General Surgery)     Objective:   Vitals: BP 100/60 mmHg  Pulse 70  Temp(Src) 98.1 F (36.7 C) (Oral)  Resp 16  Ht 5\' 7"  (1.702 m)  Wt 181 lb 3.2 oz (82.192 kg)  BMI 28.37 kg/m2   Physical Exam  Constitutional: She is oriented to person, place, and time. She appears  well-developed and well-nourished. No distress.  HENT:  Head: Normocephalic and atraumatic.  Right Ear: External ear normal.  Left Ear: External ear normal.  Nose: Nose normal.  Mouth/Throat: Oropharynx is clear and moist. No oropharyngeal exudate.  Eyes: Conjunctivae and EOM are normal. Pupils are equal, round, and reactive to light. Right eye exhibits no discharge. Left eye exhibits no discharge. No scleral icterus.  Neck: Normal range of motion. Neck supple. No JVD present. No tracheal deviation present. No thyromegaly present.  Cardiovascular: Normal rate, regular rhythm, normal heart sounds and intact distal pulses.  Exam reveals no gallop and no friction rub.   No murmur heard. Pulmonary/Chest: Effort normal and breath sounds normal. No respiratory distress. She  has no wheezes. She has no rales. She exhibits no tenderness. Right breast exhibits no inverted nipple, no mass, no nipple discharge, no skin change and no tenderness. Left breast exhibits no inverted nipple, no mass, no nipple discharge, no skin change and no tenderness. Breasts are symmetrical.  Fibro-dense breast  Abdominal: Soft. Bowel sounds are normal. She exhibits no distension and no mass. There is no tenderness. There is no rebound and no guarding.  Genitourinary:  Deferred by patient due to hysterectomy  Musculoskeletal: Normal range of motion. She exhibits no edema or tenderness.  Lymphadenopathy:    She has no cervical adenopathy.  Neurological: She is alert and oriented to person, place, and time.  Skin: Skin is warm and dry. No rash noted. She is not diaphoretic.  Psychiatric: She has a normal mood and affect. Her behavior is normal. Judgment and thought content normal.  Vitals reviewed.    Depression Screen PHQ 2/9 Scores 02/02/2015 11/03/2014  PHQ - 2 Score 0 0      Assessment & Plan:     Routine Health Maintenance and Physical Exam  Exercise Activities and Dietary recommendations Goals    None       Immunization History  Administered Date(s) Administered  . Influenza-Unspecified 01/03/2015  . Tdap 03/22/2011    Health Maintenance  Topic Date Due  . HIV Screening  08/05/2018 (Originally 06/10/1993)  . PAP SMEAR  12/07/2019 (Originally 06/11/1999)  . INFLUENZA VACCINE  09/26/2015  . TETANUS/TDAP  03/21/2021      Discussed health benefits of physical activity, and encouraged her to engage in regular exercise appropriate for her age and condition.   1. Annual physical exam Normal physical exam today. Will check labs as below and f/u pending lab results. If labs are stable and WNL she will not need to have these rechecked for one year at her next annual physical exam. She is to call the office in the meantime if she has any acute issue, questions or concerns. - CBC with Differential - Comprehensive metabolic panel - TSH  2. Breast cancer screening Breast exam today was normal. There is no family history of breast cancer. She does perform regular self breast exams.   3. Chronic constipation Recently stable on Linzess. Samples were given to her today as well as a Rx payment card. Linzess was also prescribed for patient. - linaclotide (LINZESS) 72 MCG capsule; Take 1 capsule (72 mcg total) by mouth daily before breakfast.  Dispense: 90 capsule; Refill: 3  4. IBS (irritable bowel syndrome) See above medical treatment plan. - linaclotide (LINZESS) 72 MCG capsule; Take 1 capsule (72 mcg total) by mouth daily before breakfast.  Dispense: 90 capsule; Refill: 3  5. Encounter for lipid screening for cardiovascular disease Will check labs as below and f/u pending results. - Lipid panel  6. Migraine without aura and without status migrainosus, not intractable Stable. Diagnosis pulled for medication refill. Continue current medical treatment plan. - topiramate (TOPAMAX) 100 MG tablet; TAKE 2 TABLETS (200 MG TOTAL) BY MOUTH DAILY.  Dispense: 180 tablet; Refill: 3  7. Avitaminosis  D H/O this and taking an oral OTC supplement. Will check labs and f/u pending results.   --------------------------------------------------------------------

## 2015-08-03 NOTE — Patient Instructions (Signed)
Health Maintenance, Female Adopting a healthy lifestyle and getting preventive care can go a long way to promote health and wellness. Talk with your health care provider about what schedule of regular examinations is right for you. This is a good chance for you to check in with your provider about disease prevention and staying healthy. In between checkups, there are plenty of things you can do on your own. Experts have done a lot of research about which lifestyle changes and preventive measures are most likely to keep you healthy. Ask your health care provider for more information. WEIGHT AND DIET  Eat a healthy diet  Be sure to include plenty of vegetables, fruits, low-fat dairy products, and lean protein.  Do not eat a lot of foods high in solid fats, added sugars, or salt.  Get regular exercise. This is one of the most important things you can do for your health.  Most adults should exercise for at least 150 minutes each week. The exercise should increase your heart rate and make you sweat (moderate-intensity exercise).  Most adults should also do strengthening exercises at least twice a week. This is in addition to the moderate-intensity exercise.  Maintain a healthy weight  Body mass index (BMI) is a measurement that can be used to identify possible weight problems. It estimates body fat based on height and weight. Your health care provider can help determine your BMI and help you achieve or maintain a healthy weight.  For females 28 years of age and older:   A BMI below 18.5 is considered underweight.  A BMI of 18.5 to 24.9 is normal.  A BMI of 25 to 29.9 is considered overweight.  A BMI of 30 and above is considered obese.  Watch levels of cholesterol and blood lipids  You should start having your blood tested for lipids and cholesterol at 37 years of age, then have this test every 5 years.  You may need to have your cholesterol levels checked more often if:  Your lipid  or cholesterol levels are high.  You are older than 37 years of age.  You are at high risk for heart disease.  CANCER SCREENING   Lung Cancer  Lung cancer screening is recommended for adults 75-66 years old who are at high risk for lung cancer because of a history of smoking.  A yearly low-dose CT scan of the lungs is recommended for people who:  Currently smoke.  Have quit within the past 15 years.  Have at least a 30-pack-year history of smoking. A pack year is smoking an average of one pack of cigarettes a day for 1 year.  Yearly screening should continue until it has been 15 years since you quit.  Yearly screening should stop if you develop a health problem that would prevent you from having lung cancer treatment.  Breast Cancer  Practice breast self-awareness. This means understanding how your breasts normally appear and feel.  It also means doing regular breast self-exams. Let your health care provider know about any changes, no matter how small.  If you are in your 20s or 30s, you should have a clinical breast exam (CBE) by a health care provider every 1-3 years as part of a regular health exam.  If you are 25 or older, have a CBE every year. Also consider having a breast X-ray (mammogram) every year.  If you have a family history of breast cancer, talk to your health care provider about genetic screening.  If you  are at high risk for breast cancer, talk to your health care provider about having an MRI and a mammogram every year.  Breast cancer gene (BRCA) assessment is recommended for women who have family members with BRCA-related cancers. BRCA-related cancers include:  Breast.  Ovarian.  Tubal.  Peritoneal cancers.  Results of the assessment will determine the need for genetic counseling and BRCA1 and BRCA2 testing. Cervical Cancer Your health care provider may recommend that you be screened regularly for cancer of the pelvic organs (ovaries, uterus, and  vagina). This screening involves a pelvic examination, including checking for microscopic changes to the surface of your cervix (Pap test). You may be encouraged to have this screening done every 3 years, beginning at age 21.  For women ages 30-65, health care providers may recommend pelvic exams and Pap testing every 3 years, or they may recommend the Pap and pelvic exam, combined with testing for human papilloma virus (HPV), every 5 years. Some types of HPV increase your risk of cervical cancer. Testing for HPV may also be done on women of any age with unclear Pap test results.  Other health care providers may not recommend any screening for nonpregnant women who are considered low risk for pelvic cancer and who do not have symptoms. Ask your health care provider if a screening pelvic exam is right for you.  If you have had past treatment for cervical cancer or a condition that could lead to cancer, you need Pap tests and screening for cancer for at least 20 years after your treatment. If Pap tests have been discontinued, your risk factors (such as having a new sexual partner) need to be reassessed to determine if screening should resume. Some women have medical problems that increase the chance of getting cervical cancer. In these cases, your health care provider may recommend more frequent screening and Pap tests. Colorectal Cancer  This type of cancer can be detected and often prevented.  Routine colorectal cancer screening usually begins at 37 years of age and continues through 37 years of age.  Your health care provider may recommend screening at an earlier age if you have risk factors for colon cancer.  Your health care provider may also recommend using home test kits to check for hidden blood in the stool.  A small camera at the end of a tube can be used to examine your colon directly (sigmoidoscopy or colonoscopy). This is done to check for the earliest forms of colorectal  cancer.  Routine screening usually begins at age 50.  Direct examination of the colon should be repeated every 5-10 years through 37 years of age. However, you may need to be screened more often if early forms of precancerous polyps or small growths are found. Skin Cancer  Check your skin from head to toe regularly.  Tell your health care provider about any new moles or changes in moles, especially if there is a change in a mole's shape or color.  Also tell your health care provider if you have a mole that is larger than the size of a pencil eraser.  Always use sunscreen. Apply sunscreen liberally and repeatedly throughout the day.  Protect yourself by wearing long sleeves, pants, a wide-brimmed hat, and sunglasses whenever you are outside. HEART DISEASE, DIABETES, AND HIGH BLOOD PRESSURE   High blood pressure causes heart disease and increases the risk of stroke. High blood pressure is more likely to develop in:  People who have blood pressure in the high end   of the normal range (130-139/85-89 mm Hg).  People who are overweight or obese.  People who are African American.  If you are 38-23 years of age, have your blood pressure checked every 3-5 years. If you are 61 years of age or older, have your blood pressure checked every year. You should have your blood pressure measured twice--once when you are at a hospital or clinic, and once when you are not at a hospital or clinic. Record the average of the two measurements. To check your blood pressure when you are not at a hospital or clinic, you can use:  An automated blood pressure machine at a pharmacy.  A home blood pressure monitor.  If you are between 45 years and 39 years old, ask your health care provider if you should take aspirin to prevent strokes.  Have regular diabetes screenings. This involves taking a blood sample to check your fasting blood sugar level.  If you are at a normal weight and have a low risk for diabetes,  have this test once every three years after 37 years of age.  If you are overweight and have a high risk for diabetes, consider being tested at a younger age or more often. PREVENTING INFECTION  Hepatitis B  If you have a higher risk for hepatitis B, you should be screened for this virus. You are considered at high risk for hepatitis B if:  You were born in a country where hepatitis B is common. Ask your health care provider which countries are considered high risk.  Your parents were born in a high-risk country, and you have not been immunized against hepatitis B (hepatitis B vaccine).  You have HIV or AIDS.  You use needles to inject street drugs.  You live with someone who has hepatitis B.  You have had sex with someone who has hepatitis B.  You get hemodialysis treatment.  You take certain medicines for conditions, including cancer, organ transplantation, and autoimmune conditions. Hepatitis C  Blood testing is recommended for:  Everyone born from [birth year] through 1967.  Anyone with known risk factors for hepatitis C. Sexually transmitted infections (STIs)  You should be screened for sexually transmitted infections (STIs) including gonorrhea and chlamydia if:  You are sexually active and are younger than 37 years of age.  You are older than 37 years of age and your health care provider tells you that you are at risk for this type of infection.  Your sexual activity has changed since you were last screened and you are at an increased risk for chlamydia or gonorrhea. Ask your health care provider if you are at risk.  If you do not have HIV, but are at risk, it may be recommended that you take a prescription medicine daily to prevent HIV infection. This is called pre-exposure prophylaxis (PrEP). You are considered at risk if:  You are sexually active and do not regularly use condoms or know the HIV status of your partner(s).  You take drugs by injection.  You are sexually  active with a partner who has HIV. Talk with your health care provider about whether you are at high risk of being infected with HIV. If you choose to begin PrEP, you should first be tested for HIV. You should then be tested every 3 months for as long as you are taking PrEP.  PREGNANCY   If you are premenopausal and you may become pregnant, ask your health care provider about preconception counseling.  If you may  become pregnant, take 400 to 800 micrograms (mcg) of folic acid every day.  If you want to prevent pregnancy, talk to your health care provider about birth control (contraception). OSTEOPOROSIS AND MENOPAUSE   Osteoporosis is a disease in which the bones lose minerals and strength with aging. This can result in serious bone fractures. Your risk for osteoporosis can be identified using a bone density scan.  If you are 61 years of age or older, or if you are at risk for osteoporosis and fractures, ask your health care provider if you should be screened.  Ask your health care provider whether you should take a calcium or vitamin D supplement to lower your risk for osteoporosis.  Menopause may have certain physical symptoms and risks.  Hormone replacement therapy may reduce some of these symptoms and risks. Talk to your health care provider about whether hormone replacement therapy is right for you.  HOME CARE INSTRUCTIONS   Schedule regular health, dental, and eye exams.  Stay current with your immunizations.   Do not use any tobacco products including cigarettes, chewing tobacco, or electronic cigarettes.  If you are pregnant, do not drink alcohol.  If you are breastfeeding, limit how much and how often you drink alcohol.  Limit alcohol intake to no more than 1 drink per day for nonpregnant women. One drink equals 12 ounces of beer, 5 ounces of wine, or 1 ounces of hard liquor.  Do not use street drugs.  Do not share needles.  Ask your health care provider for help if  you need support or information about quitting drugs.  Tell your health care provider if you often feel depressed.  Tell your health care provider if you have ever been abused or do not feel safe at home.   This information is not intended to replace advice given to you by your health care provider. Make sure you discuss any questions you have with your health care provider.   Document Released: 08/27/2010 Document Revised: 03/04/2014 Document Reviewed: 01/13/2013 Elsevier Interactive Patient Education [employer].

## 2015-08-18 ENCOUNTER — Other Ambulatory Visit: Payer: Self-pay | Admitting: Physician Assistant

## 2015-08-18 DIAGNOSIS — R110 Nausea: Secondary | ICD-10-CM

## 2015-09-21 ENCOUNTER — Other Ambulatory Visit: Payer: Self-pay | Admitting: Physician Assistant

## 2015-09-21 DIAGNOSIS — G4700 Insomnia, unspecified: Secondary | ICD-10-CM

## 2015-09-22 ENCOUNTER — Other Ambulatory Visit: Payer: Self-pay | Admitting: Physician Assistant

## 2015-09-22 DIAGNOSIS — G4700 Insomnia, unspecified: Secondary | ICD-10-CM

## 2015-10-05 DIAGNOSIS — R51 Headache: Secondary | ICD-10-CM | POA: Diagnosis not present

## 2015-10-05 DIAGNOSIS — G43719 Chronic migraine without aura, intractable, without status migrainosus: Secondary | ICD-10-CM | POA: Diagnosis not present

## 2015-10-05 DIAGNOSIS — Z049 Encounter for examination and observation for unspecified reason: Secondary | ICD-10-CM | POA: Diagnosis not present

## 2015-10-05 DIAGNOSIS — Z79899 Other long term (current) drug therapy: Secondary | ICD-10-CM | POA: Diagnosis not present

## 2015-10-09 ENCOUNTER — Telehealth: Payer: Self-pay | Admitting: Family Medicine

## 2015-10-11 NOTE — Telephone Encounter (Signed)
error 

## 2015-10-13 DIAGNOSIS — M542 Cervicalgia: Secondary | ICD-10-CM | POA: Diagnosis not present

## 2015-10-13 DIAGNOSIS — G43719 Chronic migraine without aura, intractable, without status migrainosus: Secondary | ICD-10-CM | POA: Diagnosis not present

## 2015-10-13 DIAGNOSIS — G518 Other disorders of facial nerve: Secondary | ICD-10-CM | POA: Diagnosis not present

## 2015-10-13 DIAGNOSIS — M791 Myalgia: Secondary | ICD-10-CM | POA: Diagnosis not present

## 2015-10-13 NOTE — Telephone Encounter (Signed)
done

## 2015-10-26 DIAGNOSIS — S82852S Displaced trimalleolar fracture of left lower leg, sequela: Secondary | ICD-10-CM | POA: Diagnosis not present

## 2015-10-26 DIAGNOSIS — M12572 Traumatic arthropathy, left ankle and foot: Secondary | ICD-10-CM | POA: Diagnosis not present

## 2015-10-26 DIAGNOSIS — M25572 Pain in left ankle and joints of left foot: Secondary | ICD-10-CM | POA: Diagnosis not present

## 2015-11-07 DIAGNOSIS — G43719 Chronic migraine without aura, intractable, without status migrainosus: Secondary | ICD-10-CM | POA: Diagnosis not present

## 2015-11-07 DIAGNOSIS — M791 Myalgia: Secondary | ICD-10-CM | POA: Diagnosis not present

## 2015-11-07 DIAGNOSIS — G518 Other disorders of facial nerve: Secondary | ICD-10-CM | POA: Diagnosis not present

## 2015-11-07 DIAGNOSIS — M542 Cervicalgia: Secondary | ICD-10-CM | POA: Diagnosis not present

## 2015-11-16 DIAGNOSIS — Z01818 Encounter for other preprocedural examination: Secondary | ICD-10-CM | POA: Diagnosis not present

## 2015-11-16 DIAGNOSIS — M12572 Traumatic arthropathy, left ankle and foot: Secondary | ICD-10-CM | POA: Diagnosis not present

## 2015-11-16 DIAGNOSIS — M25872 Other specified joint disorders, left ankle and foot: Secondary | ICD-10-CM | POA: Diagnosis not present

## 2015-11-16 DIAGNOSIS — S82852S Displaced trimalleolar fracture of left lower leg, sequela: Secondary | ICD-10-CM | POA: Diagnosis not present

## 2015-11-16 DIAGNOSIS — S82852D Displaced trimalleolar fracture of left lower leg, subsequent encounter for closed fracture with routine healing: Secondary | ICD-10-CM | POA: Diagnosis not present

## 2015-11-17 DIAGNOSIS — M25872 Other specified joint disorders, left ankle and foot: Secondary | ICD-10-CM | POA: Diagnosis not present

## 2015-11-17 DIAGNOSIS — S82852S Displaced trimalleolar fracture of left lower leg, sequela: Secondary | ICD-10-CM | POA: Diagnosis not present

## 2015-11-17 DIAGNOSIS — M65872 Other synovitis and tenosynovitis, left ankle and foot: Secondary | ICD-10-CM | POA: Diagnosis not present

## 2015-11-17 DIAGNOSIS — M24072 Loose body in left ankle: Secondary | ICD-10-CM | POA: Diagnosis not present

## 2015-11-17 DIAGNOSIS — G8918 Other acute postprocedural pain: Secondary | ICD-10-CM | POA: Diagnosis not present

## 2015-11-17 DIAGNOSIS — M24672 Ankylosis, left ankle: Secondary | ICD-10-CM | POA: Diagnosis not present

## 2015-11-17 DIAGNOSIS — M779 Enthesopathy, unspecified: Secondary | ICD-10-CM | POA: Diagnosis not present

## 2015-11-17 DIAGNOSIS — M25572 Pain in left ankle and joints of left foot: Secondary | ICD-10-CM | POA: Diagnosis not present

## 2015-11-21 ENCOUNTER — Other Ambulatory Visit: Payer: Self-pay | Admitting: Physician Assistant

## 2015-11-21 DIAGNOSIS — R110 Nausea: Secondary | ICD-10-CM

## 2015-11-21 DIAGNOSIS — G4700 Insomnia, unspecified: Secondary | ICD-10-CM

## 2015-11-21 MED ORDER — ALPRAZOLAM 1 MG PO TABS: ORAL_TABLET | ORAL | 1 refills | Status: DC

## 2015-11-21 NOTE — Telephone Encounter (Signed)
Last refill 09/21/2015. LOV 08/03/2015. Allene DillonEmily Drozdowski, CMA

## 2015-11-21 NOTE — Telephone Encounter (Signed)
Pt contacted office for refill request on the following medications:  ALPRAZolam (XANAX) 1 MG tablet.  [facility]  [phone]

## 2015-11-21 NOTE — Telephone Encounter (Signed)
LOV 08/03/2015. Last refill 08/18/2015. Allene DillonEmily Drozdowski, CMA

## 2015-11-21 NOTE — Telephone Encounter (Signed)
Called into [facility]. [name], CMA  

## 2015-11-23 ENCOUNTER — Telehealth: Payer: Self-pay | Admitting: Family Medicine

## 2015-11-24 ENCOUNTER — Other Ambulatory Visit: Payer: Self-pay | Admitting: Physician Assistant

## 2015-11-24 DIAGNOSIS — G43719 Chronic migraine without aura, intractable, without status migrainosus: Secondary | ICD-10-CM | POA: Diagnosis not present

## 2015-11-24 DIAGNOSIS — G518 Other disorders of facial nerve: Secondary | ICD-10-CM | POA: Diagnosis not present

## 2015-11-24 DIAGNOSIS — M542 Cervicalgia: Secondary | ICD-10-CM | POA: Diagnosis not present

## 2015-11-24 DIAGNOSIS — M791 Myalgia: Secondary | ICD-10-CM | POA: Diagnosis not present

## 2015-11-24 HISTORY — PX: ANKLE ARTHROSCOPY: SUR85

## 2015-11-24 NOTE — Telephone Encounter (Signed)
Pt is no longer a pt here. Called pharmacy and informed them as to who to send RX to.

## 2015-12-07 DIAGNOSIS — D2272 Melanocytic nevi of left lower limb, including hip: Secondary | ICD-10-CM | POA: Diagnosis not present

## 2015-12-07 DIAGNOSIS — L820 Inflamed seborrheic keratosis: Secondary | ICD-10-CM | POA: Diagnosis not present

## 2015-12-07 DIAGNOSIS — Z1283 Encounter for screening for malignant neoplasm of skin: Secondary | ICD-10-CM | POA: Diagnosis not present

## 2015-12-07 DIAGNOSIS — D229 Melanocytic nevi, unspecified: Secondary | ICD-10-CM | POA: Diagnosis not present

## 2015-12-07 DIAGNOSIS — D2261 Melanocytic nevi of right upper limb, including shoulder: Secondary | ICD-10-CM | POA: Diagnosis not present

## 2015-12-07 DIAGNOSIS — D485 Neoplasm of uncertain behavior of skin: Secondary | ICD-10-CM | POA: Diagnosis not present

## 2015-12-07 DIAGNOSIS — L821 Other seborrheic keratosis: Secondary | ICD-10-CM | POA: Diagnosis not present

## 2015-12-07 DIAGNOSIS — L811 Chloasma: Secondary | ICD-10-CM | POA: Diagnosis not present

## 2015-12-07 DIAGNOSIS — D225 Melanocytic nevi of trunk: Secondary | ICD-10-CM | POA: Diagnosis not present

## 2015-12-15 DIAGNOSIS — G518 Other disorders of facial nerve: Secondary | ICD-10-CM | POA: Diagnosis not present

## 2015-12-15 DIAGNOSIS — M791 Myalgia: Secondary | ICD-10-CM | POA: Diagnosis not present

## 2015-12-15 DIAGNOSIS — M542 Cervicalgia: Secondary | ICD-10-CM | POA: Diagnosis not present

## 2015-12-15 DIAGNOSIS — G43719 Chronic migraine without aura, intractable, without status migrainosus: Secondary | ICD-10-CM | POA: Diagnosis not present

## 2015-12-19 ENCOUNTER — Ambulatory Visit: Payer: Self-pay | Admitting: Family Medicine

## 2016-01-04 DIAGNOSIS — M25572 Pain in left ankle and joints of left foot: Secondary | ICD-10-CM | POA: Diagnosis not present

## 2016-01-04 DIAGNOSIS — M545 Low back pain: Secondary | ICD-10-CM | POA: Diagnosis not present

## 2016-01-04 DIAGNOSIS — R262 Difficulty in walking, not elsewhere classified: Secondary | ICD-10-CM | POA: Diagnosis not present

## 2016-01-04 DIAGNOSIS — M6281 Muscle weakness (generalized): Secondary | ICD-10-CM | POA: Diagnosis not present

## 2016-01-10 DIAGNOSIS — G518 Other disorders of facial nerve: Secondary | ICD-10-CM | POA: Diagnosis not present

## 2016-01-10 DIAGNOSIS — M791 Myalgia: Secondary | ICD-10-CM | POA: Diagnosis not present

## 2016-01-10 DIAGNOSIS — G43719 Chronic migraine without aura, intractable, without status migrainosus: Secondary | ICD-10-CM | POA: Diagnosis not present

## 2016-01-10 DIAGNOSIS — M542 Cervicalgia: Secondary | ICD-10-CM | POA: Diagnosis not present

## 2016-01-12 DIAGNOSIS — M545 Low back pain: Secondary | ICD-10-CM | POA: Diagnosis not present

## 2016-01-12 DIAGNOSIS — M25572 Pain in left ankle and joints of left foot: Secondary | ICD-10-CM | POA: Diagnosis not present

## 2016-01-12 DIAGNOSIS — M6281 Muscle weakness (generalized): Secondary | ICD-10-CM | POA: Diagnosis not present

## 2016-01-12 DIAGNOSIS — R262 Difficulty in walking, not elsewhere classified: Secondary | ICD-10-CM | POA: Diagnosis not present

## 2016-01-16 DIAGNOSIS — R262 Difficulty in walking, not elsewhere classified: Secondary | ICD-10-CM | POA: Diagnosis not present

## 2016-01-16 DIAGNOSIS — M545 Low back pain: Secondary | ICD-10-CM | POA: Diagnosis not present

## 2016-01-16 DIAGNOSIS — M25572 Pain in left ankle and joints of left foot: Secondary | ICD-10-CM | POA: Diagnosis not present

## 2016-01-16 DIAGNOSIS — M6281 Muscle weakness (generalized): Secondary | ICD-10-CM | POA: Diagnosis not present

## 2016-01-23 DIAGNOSIS — R262 Difficulty in walking, not elsewhere classified: Secondary | ICD-10-CM | POA: Diagnosis not present

## 2016-01-23 DIAGNOSIS — M545 Low back pain: Secondary | ICD-10-CM | POA: Diagnosis not present

## 2016-01-23 DIAGNOSIS — M6281 Muscle weakness (generalized): Secondary | ICD-10-CM | POA: Diagnosis not present

## 2016-01-23 DIAGNOSIS — M25572 Pain in left ankle and joints of left foot: Secondary | ICD-10-CM | POA: Diagnosis not present

## 2016-01-25 DIAGNOSIS — M791 Myalgia: Secondary | ICD-10-CM | POA: Diagnosis not present

## 2016-01-25 DIAGNOSIS — M542 Cervicalgia: Secondary | ICD-10-CM | POA: Diagnosis not present

## 2016-01-25 DIAGNOSIS — G518 Other disorders of facial nerve: Secondary | ICD-10-CM | POA: Diagnosis not present

## 2016-01-25 DIAGNOSIS — G43719 Chronic migraine without aura, intractable, without status migrainosus: Secondary | ICD-10-CM | POA: Diagnosis not present

## 2016-01-25 DIAGNOSIS — M6281 Muscle weakness (generalized): Secondary | ICD-10-CM | POA: Diagnosis not present

## 2016-01-25 DIAGNOSIS — M545 Low back pain: Secondary | ICD-10-CM | POA: Diagnosis not present

## 2016-01-25 DIAGNOSIS — R262 Difficulty in walking, not elsewhere classified: Secondary | ICD-10-CM | POA: Diagnosis not present

## 2016-01-25 DIAGNOSIS — M25572 Pain in left ankle and joints of left foot: Secondary | ICD-10-CM | POA: Diagnosis not present

## 2016-01-30 DIAGNOSIS — R262 Difficulty in walking, not elsewhere classified: Secondary | ICD-10-CM | POA: Diagnosis not present

## 2016-01-30 DIAGNOSIS — M25572 Pain in left ankle and joints of left foot: Secondary | ICD-10-CM | POA: Diagnosis not present

## 2016-01-30 DIAGNOSIS — M545 Low back pain: Secondary | ICD-10-CM | POA: Diagnosis not present

## 2016-01-30 DIAGNOSIS — M6281 Muscle weakness (generalized): Secondary | ICD-10-CM | POA: Diagnosis not present

## 2016-02-01 DIAGNOSIS — M25572 Pain in left ankle and joints of left foot: Secondary | ICD-10-CM | POA: Diagnosis not present

## 2016-02-01 DIAGNOSIS — M545 Low back pain: Secondary | ICD-10-CM | POA: Diagnosis not present

## 2016-02-01 DIAGNOSIS — R262 Difficulty in walking, not elsewhere classified: Secondary | ICD-10-CM | POA: Diagnosis not present

## 2016-02-01 DIAGNOSIS — M25672 Stiffness of left ankle, not elsewhere classified: Secondary | ICD-10-CM | POA: Diagnosis not present

## 2016-02-04 ENCOUNTER — Other Ambulatory Visit: Payer: Self-pay | Admitting: Physician Assistant

## 2016-02-04 DIAGNOSIS — G4700 Insomnia, unspecified: Secondary | ICD-10-CM

## 2016-02-05 NOTE — Telephone Encounter (Signed)
Called into [facility]. Emily Drozdowski, CMA  

## 2016-02-06 DIAGNOSIS — M545 Low back pain: Secondary | ICD-10-CM | POA: Diagnosis not present

## 2016-02-06 DIAGNOSIS — R262 Difficulty in walking, not elsewhere classified: Secondary | ICD-10-CM | POA: Diagnosis not present

## 2016-02-06 DIAGNOSIS — M6281 Muscle weakness (generalized): Secondary | ICD-10-CM | POA: Diagnosis not present

## 2016-02-08 DIAGNOSIS — M25572 Pain in left ankle and joints of left foot: Secondary | ICD-10-CM | POA: Diagnosis not present

## 2016-02-08 DIAGNOSIS — R262 Difficulty in walking, not elsewhere classified: Secondary | ICD-10-CM | POA: Diagnosis not present

## 2016-02-08 DIAGNOSIS — M6281 Muscle weakness (generalized): Secondary | ICD-10-CM | POA: Diagnosis not present

## 2016-02-08 DIAGNOSIS — M545 Low back pain: Secondary | ICD-10-CM | POA: Diagnosis not present

## 2016-02-10 DIAGNOSIS — G43001 Migraine without aura, not intractable, with status migrainosus: Secondary | ICD-10-CM | POA: Diagnosis not present

## 2016-02-10 DIAGNOSIS — M9903 Segmental and somatic dysfunction of lumbar region: Secondary | ICD-10-CM | POA: Diagnosis not present

## 2016-02-10 DIAGNOSIS — M5416 Radiculopathy, lumbar region: Secondary | ICD-10-CM | POA: Diagnosis not present

## 2016-02-10 DIAGNOSIS — M9901 Segmental and somatic dysfunction of cervical region: Secondary | ICD-10-CM | POA: Diagnosis not present

## 2016-02-13 DIAGNOSIS — M5416 Radiculopathy, lumbar region: Secondary | ICD-10-CM | POA: Diagnosis not present

## 2016-02-13 DIAGNOSIS — M9903 Segmental and somatic dysfunction of lumbar region: Secondary | ICD-10-CM | POA: Diagnosis not present

## 2016-02-13 DIAGNOSIS — M6281 Muscle weakness (generalized): Secondary | ICD-10-CM | POA: Diagnosis not present

## 2016-02-13 DIAGNOSIS — M9901 Segmental and somatic dysfunction of cervical region: Secondary | ICD-10-CM | POA: Diagnosis not present

## 2016-02-13 DIAGNOSIS — G43001 Migraine without aura, not intractable, with status migrainosus: Secondary | ICD-10-CM | POA: Diagnosis not present

## 2016-02-13 DIAGNOSIS — M545 Low back pain: Secondary | ICD-10-CM | POA: Diagnosis not present

## 2016-02-13 DIAGNOSIS — R262 Difficulty in walking, not elsewhere classified: Secondary | ICD-10-CM | POA: Diagnosis not present

## 2016-02-14 DIAGNOSIS — M9903 Segmental and somatic dysfunction of lumbar region: Secondary | ICD-10-CM | POA: Diagnosis not present

## 2016-02-14 DIAGNOSIS — G43001 Migraine without aura, not intractable, with status migrainosus: Secondary | ICD-10-CM | POA: Diagnosis not present

## 2016-02-14 DIAGNOSIS — M5416 Radiculopathy, lumbar region: Secondary | ICD-10-CM | POA: Diagnosis not present

## 2016-02-14 DIAGNOSIS — M9901 Segmental and somatic dysfunction of cervical region: Secondary | ICD-10-CM | POA: Diagnosis not present

## 2016-02-15 DIAGNOSIS — M25572 Pain in left ankle and joints of left foot: Secondary | ICD-10-CM | POA: Diagnosis not present

## 2016-02-15 DIAGNOSIS — M5416 Radiculopathy, lumbar region: Secondary | ICD-10-CM | POA: Diagnosis not present

## 2016-02-15 DIAGNOSIS — M9903 Segmental and somatic dysfunction of lumbar region: Secondary | ICD-10-CM | POA: Diagnosis not present

## 2016-02-15 DIAGNOSIS — M6281 Muscle weakness (generalized): Secondary | ICD-10-CM | POA: Diagnosis not present

## 2016-02-15 DIAGNOSIS — R262 Difficulty in walking, not elsewhere classified: Secondary | ICD-10-CM | POA: Diagnosis not present

## 2016-02-15 DIAGNOSIS — G43001 Migraine without aura, not intractable, with status migrainosus: Secondary | ICD-10-CM | POA: Diagnosis not present

## 2016-02-15 DIAGNOSIS — M9901 Segmental and somatic dysfunction of cervical region: Secondary | ICD-10-CM | POA: Diagnosis not present

## 2016-02-15 DIAGNOSIS — M545 Low back pain: Secondary | ICD-10-CM | POA: Diagnosis not present

## 2016-02-20 DIAGNOSIS — M545 Low back pain: Secondary | ICD-10-CM | POA: Diagnosis not present

## 2016-02-20 DIAGNOSIS — M6281 Muscle weakness (generalized): Secondary | ICD-10-CM | POA: Diagnosis not present

## 2016-02-20 DIAGNOSIS — R262 Difficulty in walking, not elsewhere classified: Secondary | ICD-10-CM | POA: Diagnosis not present

## 2016-02-22 DIAGNOSIS — R262 Difficulty in walking, not elsewhere classified: Secondary | ICD-10-CM | POA: Diagnosis not present

## 2016-02-22 DIAGNOSIS — M545 Low back pain: Secondary | ICD-10-CM | POA: Diagnosis not present

## 2016-02-22 DIAGNOSIS — M6281 Muscle weakness (generalized): Secondary | ICD-10-CM | POA: Diagnosis not present

## 2016-03-06 DIAGNOSIS — R262 Difficulty in walking, not elsewhere classified: Secondary | ICD-10-CM | POA: Diagnosis not present

## 2016-03-06 DIAGNOSIS — M6281 Muscle weakness (generalized): Secondary | ICD-10-CM | POA: Diagnosis not present

## 2016-03-06 DIAGNOSIS — M545 Low back pain: Secondary | ICD-10-CM | POA: Diagnosis not present

## 2016-03-08 DIAGNOSIS — G43719 Chronic migraine without aura, intractable, without status migrainosus: Secondary | ICD-10-CM | POA: Diagnosis not present

## 2016-03-21 DIAGNOSIS — R262 Difficulty in walking, not elsewhere classified: Secondary | ICD-10-CM | POA: Diagnosis not present

## 2016-03-21 DIAGNOSIS — M6281 Muscle weakness (generalized): Secondary | ICD-10-CM | POA: Diagnosis not present

## 2016-03-21 DIAGNOSIS — M545 Low back pain: Secondary | ICD-10-CM | POA: Diagnosis not present

## 2016-03-29 ENCOUNTER — Other Ambulatory Visit: Payer: Self-pay | Admitting: Physician Assistant

## 2016-04-10 ENCOUNTER — Other Ambulatory Visit: Payer: Self-pay | Admitting: Physician Assistant

## 2016-04-10 DIAGNOSIS — G4700 Insomnia, unspecified: Secondary | ICD-10-CM

## 2016-04-10 DIAGNOSIS — R110 Nausea: Secondary | ICD-10-CM

## 2016-04-11 ENCOUNTER — Other Ambulatory Visit: Payer: Self-pay | Admitting: Physician Assistant

## 2016-04-11 DIAGNOSIS — G4700 Insomnia, unspecified: Secondary | ICD-10-CM

## 2016-04-11 NOTE — Telephone Encounter (Signed)
[facility]ed [facility] 

## 2016-05-16 ENCOUNTER — Encounter: Payer: Self-pay | Admitting: Physician Assistant

## 2016-05-16 DIAGNOSIS — K219 Gastro-esophageal reflux disease without esophagitis: Secondary | ICD-10-CM

## 2016-05-17 MED ORDER — OMEPRAZOLE 20 MG PO CPDR: 20.0000 mg | DELAYED_RELEASE_CAPSULE | Freq: Every day | ORAL | 3 refills | Status: DC

## 2016-06-04 DIAGNOSIS — M5416 Radiculopathy, lumbar region: Secondary | ICD-10-CM | POA: Diagnosis not present

## 2016-06-05 ENCOUNTER — Ambulatory Visit: Payer: Self-pay | Admitting: Physician Assistant

## 2016-06-06 ENCOUNTER — Encounter (HOSPITAL_COMMUNITY): Payer: Self-pay | Admitting: Vascular Surgery

## 2016-06-06 ENCOUNTER — Emergency Department (HOSPITAL_COMMUNITY)
Admission: EM | Admit: 2016-06-06 | Discharge: 2016-06-07 | Disposition: A | Discharge status: Home / Self Care | Payer: BLUE CROSS/BLUE SHIELD | Attending: Emergency Medicine | Admitting: Emergency Medicine

## 2016-06-06 DIAGNOSIS — N200 Calculus of kidney: Secondary | ICD-10-CM | POA: Diagnosis not present

## 2016-06-06 DIAGNOSIS — J45909 Unspecified asthma, uncomplicated: Secondary | ICD-10-CM | POA: Insufficient documentation

## 2016-06-06 DIAGNOSIS — N201 Calculus of ureter: Secondary | ICD-10-CM | POA: Diagnosis not present

## 2016-06-06 DIAGNOSIS — R109 Unspecified abdominal pain: Secondary | ICD-10-CM | POA: Diagnosis not present

## 2016-06-06 DIAGNOSIS — N202 Calculus of kidney with calculus of ureter: Secondary | ICD-10-CM | POA: Insufficient documentation

## 2016-06-06 DIAGNOSIS — Z87891 Personal history of nicotine dependence: Secondary | ICD-10-CM | POA: Diagnosis not present

## 2016-06-06 LAB — BASIC METABOLIC PANEL
Anion gap: 11 (ref 5–15)
BUN: 12 mg/dL (ref 6–20)
CO2: 22 mmol/L (ref 22–32)
Calcium: 9.7 mg/dL (ref 8.9–10.3)
Chloride: 106 mmol/L (ref 101–111)
Creatinine, Ser: 0.84 mg/dL (ref 0.44–1.00)
GFR calc Af Amer: >60 mL/min (ref >60)
GFR calc non Af Amer: >60 mL/min (ref >60)
Glucose, Bld: 88 mg/dL (ref 65–99)
Potassium: 3.9 mmol/L (ref 3.5–5.1)
Sodium: 139 mmol/L (ref 135–145)

## 2016-06-06 LAB — CBC WITH DIFFERENTIAL/PLATELET
Basophils Absolute: 0 10*3/uL (ref 0.0–0.1)
Basophils Relative: 0 %
Eosinophils Absolute: 0 10*3/uL (ref 0.0–0.7)
Eosinophils Relative: 0 %
HCT: 38.6 % (ref 36.0–46.0)
Hemoglobin: 12.8 g/dL (ref 12.0–15.0)
Lymphocytes Relative: 31 %
Lymphs Abs: 3.4 10*3/uL (ref 0.7–4.0)
MCH: 30.7 pg (ref 26.0–34.0)
MCHC: 33.2 g/dL (ref 30.0–36.0)
MCV: 92.6 fL (ref 78.0–100.0)
Monocytes Absolute: 0.5 10*3/uL (ref 0.1–1.0)
Monocytes Relative: 4 %
Neutro Abs: 7.3 10*3/uL (ref 1.7–7.7)
Neutrophils Relative %: 65 %
Platelets: 235 10*3/uL (ref 150–400)
RBC: 4.17 MIL/uL (ref 3.87–5.11)
RDW: 13.3 % (ref 11.5–15.5)
WBC: 11.2 10*3/uL — ABNORMAL HIGH (ref 4.0–10.5)

## 2016-06-06 LAB — URINALYSIS, ROUTINE W REFLEX MICROSCOPIC
Bilirubin Urine: NEGATIVE
Glucose, UA: NEGATIVE mg/dL
Ketones, ur: NEGATIVE mg/dL
Leukocytes, UA: NEGATIVE
Nitrite: NEGATIVE
Protein, ur: NEGATIVE mg/dL
Specific Gravity, Urine: 1.024 (ref 1.005–1.030)
Squamous Epithelial / LPF: NONE SEEN
pH: 5 (ref 5.0–8.0)

## 2016-06-06 MED ORDER — SODIUM CHLORIDE 0.9 % IV BOLUS (SEPSIS)
1000.0000 mL | Freq: Once | INTRAVENOUS | Status: AC
  Administered 2016-06-06: 1000 mL via INTRAVENOUS

## 2016-06-06 MED ORDER — ONDANSETRON 4 MG PO TBDP
ORAL_TABLET | ORAL | Status: AC
  Filled 2016-06-06: qty 1

## 2016-06-06 MED ORDER — ONDANSETRON 4 MG PO TBDP
4.0000 mg | ORAL_TABLET | Freq: Once | ORAL | Status: AC
  Administered 2016-06-06: 4 mg via ORAL

## 2016-06-06 MED ORDER — OXYCODONE-ACETAMINOPHEN 5-325 MG PO TABS
1.0000 | ORAL_TABLET | ORAL | Status: DC | PRN
  Administered 2016-06-06: 1 via ORAL

## 2016-06-06 MED ORDER — OXYCODONE-ACETAMINOPHEN 5-325 MG PO TABS
ORAL_TABLET | ORAL | Status: AC
  Filled 2016-06-06: qty 1

## 2016-06-06 MED ORDER — MORPHINE SULFATE (PF) 4 MG/ML IV SOLN
4.0000 mg | Freq: Once | INTRAVENOUS | Status: AC
  Administered 2016-06-06: 4 mg via INTRAVENOUS
  Filled 2016-06-06: qty 1

## 2016-06-06 NOTE — ED Triage Notes (Signed)
Pt reports to the ED for eval of right flank pain. She states that she has hx of nephrolithiasis and states that this feels similar. She tested her urine at work and noted hematuria. She has tried hot shower, Ibuprofen, and walking around without relief in her pain. Hx of having to have lithotripsy for her stones.

## 2016-06-07 ENCOUNTER — Emergency Department (HOSPITAL_COMMUNITY): Payer: BLUE CROSS/BLUE SHIELD

## 2016-06-07 DIAGNOSIS — N201 Calculus of ureter: Secondary | ICD-10-CM | POA: Diagnosis not present

## 2016-06-07 MED ORDER — IBUPROFEN 800 MG PO TABS: 800.0000 mg | ORAL_TABLET | Freq: Three times a day (TID) | ORAL | 0 refills | Status: DC | PRN

## 2016-06-07 MED ORDER — HYDROCODONE-ACETAMINOPHEN 5-325 MG PO TABS: 1.0000 | ORAL_TABLET | Freq: Four times a day (QID) | ORAL | 0 refills | Status: DC | PRN

## 2016-06-07 MED ORDER — ONDANSETRON 4 MG PO TBDP: 4.0000 mg | ORAL_TABLET | Freq: Three times a day (TID) | ORAL | 0 refills | Status: DC | PRN

## 2016-06-07 MED ORDER — MORPHINE SULFATE (PF) 4 MG/ML IV SOLN
4.0000 mg | Freq: Once | INTRAVENOUS | Status: AC
  Administered 2016-06-07: 4 mg via INTRAVENOUS
  Filled 2016-06-07: qty 1

## 2016-06-07 MED ORDER — ONDANSETRON HCL 4 MG/2ML IJ SOLN
4.0000 mg | Freq: Once | INTRAMUSCULAR | Status: AC
  Administered 2016-06-07: 4 mg via INTRAVENOUS
  Filled 2016-06-07: qty 2

## 2016-06-07 MED ORDER — KETOROLAC TROMETHAMINE 30 MG/ML IJ SOLN
30.0000 mg | Freq: Once | INTRAMUSCULAR | Status: AC
  Administered 2016-06-07: 30 mg via INTRAVENOUS
  Filled 2016-06-07: qty 1

## 2016-06-07 NOTE — ED Notes (Signed)
Pt returned from CT and connected to the monitor 

## 2016-06-07 NOTE — ED Notes (Signed)
Patient transported to CT 

## 2016-06-07 NOTE — Discharge Instructions (Signed)
You have been diagnosed with kidney stones.  Use your pain medication as prescribed and do not operate heavy machinery while on this medication. Note that your pain medication contains Acetaminophen (Tylenol), therefore it is not recommended to take additional Tylenol while on your pain medication. Continue to drink fluids to help you pass the stones.  Follow up with your urology clinic listed in regards to your hospital visit.  Return to the ED immediately if you develop fever that persists > 101, uncontrolled pain or vomiting, or other concerns.  Read the instructions below to learn more about kidney stones.    Kidney Stones Kidney stones (ureteral lithiasis) are solid masses that form inside your kidneys. The intense pain is caused by the stone moving through the kidney, ureter, bladder, and urethra (urinary tract). When the stone moves, the ureter starts to spasm around the stone. The stone is usually passed in the urine.   HOME CARE Drink enough fluids to keep your urine clear or pale yellow. This helps to get the stone out.  Only take medicine as told by your doctor.  Follow up with your doctor as told.   GET HELP RIGHT AWAY IF:  Your pain does not get better with medicine.  You have a fever.  Your pain increases and gets worse over 18 hours.  You have new belly (abdominal) pain.  You feel faint or pass out.   MAKE SURE YOU:  Understand these instructions.  Will watch your condition.  Will get help right away if you are not doing well or get worse.

## 2016-06-07 NOTE — ED Notes (Signed)
ED Provider at bedside. 

## 2016-06-07 NOTE — ED Provider Notes (Signed)
[facility]-EMERGENCY DEPT Provider Note   CSN: [id number] Arrival date & time: 06/06/16  1957     History   Chief Complaint Chief Complaint  Patient presents with  . Flank Pain    HPI [name] is a 36 y.o. female.  The history is provided by the patient and medical records. No language interpreter was used.  Flank Pain  Pertinent negatives include no chest pain, no abdominal pain, no headaches and no shortness of breath.   [name] is a 36 y.o. female  with a PMH of kidney stones who presents to the Emergency Department complaining of right-sided back pain which began this morning. Pain started radiating across the flank and into the groin as the day progressed. Patient states her pain today is similar to history of previous stones. She has required lithotripsy in the past. She has not experienced any dysuria until about an hour ago. No urinary frequency or urgency. No vaginal discharge. Patient works as a Engineer, civil (consulting) at the [employer]. She did a urine dip stick while at work and there was blood in her urine. She became concerned for stone and came to the emergency department. She denies any fever or chills. Took 800 mg of ibuprofen prior to arrival with little relief. Pain feels underwent pacing. Worse when lying still.   Past Medical History:  Diagnosis Date  . Acute pyelonephritis 06/29/2012  . Anxiety   . Asthma   . Gallstones   . Kidney stones    bilateral  . Migraine   . Pelvic pain in female   . Pyelonephritis 2015  . Renal mass    right  . Tobacco abuse     Patient Active Problem List   Diagnosis Date Noted  . IBS (irritable bowel syndrome) 02/02/2015  . Herpes simplex 02/02/2015  . Asthma, mild intermittent 11/03/2014  . Migraine without aura or status migrainosus 11/03/2014  . Gastroesophageal reflux disease without esophagitis 11/03/2014  . Generalized anxiety disorder 11/03/2014  . Other and unspecified ovarian cyst 08/06/2013  . Left  lower quadrant pain 07/16/2013  . Appendicolith 07/16/2013  . Chronic constipation 06/30/2012  . Gross hematuria 06/30/2012  . History of pyelonephritis 06/29/2012  . Nephrolithiasis 06/28/2012    Past Surgical History:  Procedure Laterality Date  . ABDOMINAL HYSTERECTOMY  2006  . ANKLE ARTHROSCOPY Left   . APPENDECTOMY  07/26/13  . CAST APPLICATION  12/25/2011   Procedure: CAST APPLICATION;  Surgeon: Javier Docker, MD;  Location: [facility];  Service: Orthopedics;  Laterality: Left;  . CHOLECYSTECTOMY  03/11/2011   Procedure: LAPAROSCOPIC CHOLECYSTECTOMY WITH INTRAOPERATIVE CHOLANGIOGRAM;  Surgeon: Atilano Ina, MD;  Location: [facility] OR;  Service: General;  Laterality: N/A;  . CHOLECYSTECTOMY, LAPAROSCOPIC    . LITHOTRIPSY     x 4  . ORIF ANKLE FRACTURE  12/25/2011   Procedure: OPEN REDUCTION INTERNAL FIXATION (ORIF) ANKLE FRACTURE;  Surgeon: Javier Docker, MD;  Location: [facility];  Service: Orthopedics;  Laterality: Left;  . TONSILLECTOMY      OB History    Gravida Para Term Preterm AB Living   SAB TAB Ectopic Multiple Live Births                  Obstetric Comments   1st Menstrual Cycle:  10 1st Pregnancy:  17       Home Medications    Prior to Admission medications   Medication Sig Start  Date End Date Taking? Authorizing Provider  ALPRAZolam (XANAX) 1 MG tablet TAKE 1 TABLET BY MOUTH EVERY DAY AT BEDTIME AND 1/2 TABLET DAILY AS NEEDED Patient taking differently: TAKE 1 TABLET BY MOUTH EVERY DAY AT BEDTIME AND 1/2 TABLET DAILY AS NEEDED FOR ANXIETY 04/11/16  Yes Alessandra Bevels Burnette, PA-C  baclofen (LIORESAL) 10 MG tablet Take 5-10 mg by mouth 3 (three) times daily as needed for migraine. 05/15/16  Yes Historical Provider, MD  chlorproMAZINE (THORAZINE) 25 MG tablet Take 25-50 mg by mouth every 4 (four) hours as needed for migraine. 05/15/16  Yes Historical Provider, MD  cyclobenzaprine (FLEXERIL) 10 MG tablet Take 10 mg by mouth 3 (three) times daily as needed for  spasms. 06/04/16  Yes Historical Provider, MD  methylPREDNISolone (MEDROL DOSEPAK) 4 MG TBPK tablet Take 4-24 mg by mouth daily. 06/04/16  Yes Historical Provider, MD  Multiple Vitamin (MULTIVITAMIN WITH MINERALS) TABS tablet Take 1 tablet by mouth daily.   Yes Historical Provider, MD  omeprazole (PRILOSEC) 20 MG capsule Take 1 capsule (20 mg total) by mouth daily. 05/17/16  Yes Alessandra Bevels Burnette, PA-C  promethazine (PHENERGAN) 25 MG tablet TAKE 1 TABLET (25 MG TOTAL) BY MOUTH EVERY 6 (SIX) HOURS AS NEEDED FOR NAUSEA OR VOMITING. 04/11/16  Yes Alessandra Bevels Burnette, PA-C  valACYclovir (VALTREX) 500 MG tablet Take 1-2 tablets (500-1,000 mg total) by mouth 2 (two) times daily as needed. Patient taking differently: Take 500-1,000 mg by mouth 2 (two) times daily as needed (cold sores).  02/02/15  Yes Alba Cory, MD  zonisamide (ZONEGRAN) 100 MG capsule Take 200 mg by mouth at bedtime. 03/08/16  Yes Historical Provider, MD  HYDROcodone-acetaminophen (NORCO) 5-325 MG tablet Take 1 tablet by mouth every 6 (six) hours as needed for moderate pain. 06/07/16   Chase Picket [employer], PA-C  ibuprofen (ADVIL,MOTRIN) 800 MG tablet Take 1 tablet (800 mg total) by mouth every 8 (eight) hours as needed for moderate pain. 06/07/16   Chase Picket Maurissa Ambrose, PA-C  ondansetron (ZOFRAN ODT) 4 MG disintegrating tablet Take 1 tablet (4 mg total) by mouth every 8 (eight) hours as needed for nausea or vomiting. 06/07/16   Chase Picket Vassie Kugel, PA-C    Family History Family History  Problem Relation Age of Onset  . Diabetes Mother   . Diabetes Father   . Heart failure Father   . Hypertension Father   . Pulmonary fibrosis Father   . Nephrolithiasis Father     Social History Social History  Substance Use Topics  . Smoking status: Former Smoker    Packs/day: 0.50    Years: 15.00    Types: Cigarettes    Start date: 11/03/1999    Quit date: 09/26/2014  . Smokeless tobacco: Never Used  . Alcohol use 0.0 oz/week     Comment:  social-rare     Allergies   Compazine and Metoclopramide hcl   Review of Systems Review of Systems  Constitutional: Negative for chills and fever.  HENT: Negative for congestion.   Eyes: Negative for visual disturbance.  Respiratory: Negative for cough and shortness of breath.   Cardiovascular: Negative for chest pain.  Gastrointestinal: Negative for abdominal pain, nausea and vomiting.  Genitourinary: Positive for dysuria and flank pain. Negative for frequency, urgency and vaginal discharge.  Musculoskeletal: Positive for back pain. Negative for neck pain.  Skin: Negative for rash.  Neurological: Negative for headaches.     Physical Exam Updated Vital Signs BP 111/75 (BP Location: Right Arm)   Pulse 65  Temp 97.8 F (36.6 C) (Oral)   Resp 18   SpO2 100%   Physical Exam  Constitutional: She is oriented to person, place, and time. She appears well-developed and well-nourished. No distress.  HENT:  Head: Normocephalic and atraumatic.  Cardiovascular: Normal rate, regular rhythm and normal heart sounds.   No murmur heard. Pulmonary/Chest: Effort normal and breath sounds normal. No respiratory distress. She has no wheezes. She has no rales.  Abdominal: Soft. She exhibits no distension. There is no tenderness.  + right sided flank tenderness.   Musculoskeletal: She exhibits no edema.  Neurological: She is alert and oriented to person, place, and time.  Skin: Skin is warm and dry.  Nursing note and vitals reviewed.    ED Treatments / Results  Labs (all labs ordered are listed, but only abnormal results are displayed) Labs Reviewed  URINALYSIS, ROUTINE W REFLEX MICROSCOPIC - Abnormal; Notable for the following:       Result Value   Hgb urine dipstick MODERATE (*)    Bacteria, UA RARE (*)    All other components within normal limits  CBC WITH DIFFERENTIAL/PLATELET - Abnormal; Notable for the following:    WBC 11.2 (*)    All other components within normal limits    BASIC METABOLIC PANEL    EKG  EKG Interpretation None       Radiology Ct Renal Stone Study  Result Date: 06/07/2016 CLINICAL DATA:  Flank pain EXAM: CT ABDOMEN AND PELVIS WITHOUT CONTRAST TECHNIQUE: Multidetector CT imaging of the abdomen and pelvis was performed following the standard protocol without IV contrast. COMPARISON:  CT abdomen pelvis 05/15/2015 FINDINGS: Lower chest: No pulmonary nodules or pleural effusion. No visible pericardial effusion. Hepatobiliary: Status post cholecystectomy.  Unremarkable liver. Pancreas: Normal noncontrast appearance of the pancreas. No peripancreatic fluid collection. Spleen: Normal. Adrenal glands: Normal. Urinary Tract: --Right kidney/ureter: There are multiple right renal calculi, measuring up to 5 mm. No hydronephrosis or perinephric stranding. No ureteral obstruction. --Left kidney/ureter: There are multiple left-sided renal calculi measuring up to 5 mm. No hydronephrosis or perinephric stranding. Within the midportion of the left ureter there is a calculus measuring 3 x 3 mm. The ureter is not dilated. --Urinary bladder: Unremarkable. Stomach/Bowel: No dilated loops of bowel. No evidence of colonic or enteric inflammation. No fluid collection within the abdomen. Vascular/Lymphatic: No abdominal aortic aneurysm or atherosclerotic calcification. No abdominal or pelvic lymphadenopathy. Reproductive: Status post hysterectomy.  Normal ovaries. Musculoskeletal. No focal osseous lesion. Normal visualized extraperitoneal and extrathoracic soft tissues. IMPRESSION: 1. Left ureteral calculus within the mid ureter measuring 3 x 3 mm. No associated hydronephrosis or perinephric stranding. 2. Multiple bilateral nonobstructing renal calculi measuring up to 5 mm. Electronically Signed   By: Deatra Robinson M.D.   On: 06/07/2016 00:44    Procedures Procedures (including critical care time)  Medications Ordered in ED Medications  oxyCODONE-acetaminophen  (PERCOCET/ROXICET) 5-325 MG per tablet 1 tablet (1 tablet Oral Given 06/06/16 2028)  ondansetron (ZOFRAN-ODT) disintegrating tablet 4 mg (4 mg Oral Given 06/06/16 2028)  sodium chloride 0.9 % bolus 1,000 mL (0 mLs Intravenous Stopped 06/07/16 0011)  morphine 4 MG/ML injection 4 mg (4 mg Intravenous Given 06/06/16 2257)  morphine 4 MG/ML injection 4 mg (4 mg Intravenous Given 06/07/16 0044)  ketorolac (TORADOL) 30 MG/ML injection 30 mg (30 mg Intravenous Given 06/07/16 0115)  ondansetron (ZOFRAN) injection 4 mg (4 mg Intravenous Given 06/07/16 0114)     Initial Impression / Assessment and Plan / ED Course  I have reviewed the triage vital signs and the nursing notes.  Pertinent labs & imaging results that were available during my care of the patient were reviewed by me and considered in my medical decision making (see chart for details).    [name] is a 36 y.o. female who presents to ED for right flank pain c/w her typical kidney stone pain. Labs reviewed and reassuring. UA with no signs of infection but does show moderate hgb. 1L IV fluids, pain control and nausea control. CT stone study ordered.   CT scan shows 3x77mm LEFT ureteral stone with no hydro or stranding. Patient informed of results and confirmed she is indeed having RIGHT sided pain. She does state that when she went to the restroom just prior to CT she felt a burning pain with urination then pain improved. Likely passed stone prior to scan. Pain and nausea controlled. She is tolerating PO. Symptomatic home care instructions discussed. She is followed by urology and encouraged to follow up with them. Return precautions discussed and all questions answered.  Final Clinical Impressions(s) / ED Diagnoses   Final diagnoses:  Right flank pain  Kidney stones    New Prescriptions New Prescriptions   HYDROCODONE-ACETAMINOPHEN (NORCO) 5-325 MG TABLET    Take 1 tablet by mouth every 6 (six) hours as needed for moderate pain.    IBUPROFEN (ADVIL,MOTRIN) 800 MG TABLET    Take 1 tablet (800 mg total) by mouth every 8 (eight) hours as needed for moderate pain.   ONDANSETRON (ZOFRAN ODT) 4 MG DISINTEGRATING TABLET    Take 1 tablet (4 mg total) by mouth every 8 (eight) hours as needed for nausea or vomiting.     [facility] Calandra Madura, PA-C 06/07/16 0124    Donnetta Hutching, MD 06/10/16 1340

## 2016-06-07 NOTE — ED Notes (Signed)
Pt stated she felt like she really needed to void but everytime she tries nothing but a few drops come out. This RN bladder scanned the pt and after receiving 1L of fluids the bladder scan read at most 17ml.

## 2016-06-10 ENCOUNTER — Emergency Department
Admission: EM | Admit: 2016-06-10 | Discharge: 2016-06-10 | Disposition: A | Discharge status: Home / Self Care | Payer: BLUE CROSS/BLUE SHIELD | Attending: Emergency Medicine | Admitting: Emergency Medicine

## 2016-06-10 ENCOUNTER — Emergency Department: Payer: BLUE CROSS/BLUE SHIELD

## 2016-06-10 ENCOUNTER — Encounter: Payer: Self-pay | Admitting: *Deleted

## 2016-06-10 DIAGNOSIS — N200 Calculus of kidney: Secondary | ICD-10-CM | POA: Insufficient documentation

## 2016-06-10 DIAGNOSIS — Z79899 Other long term (current) drug therapy: Secondary | ICD-10-CM | POA: Insufficient documentation

## 2016-06-10 DIAGNOSIS — Z87891 Personal history of nicotine dependence: Secondary | ICD-10-CM | POA: Insufficient documentation

## 2016-06-10 DIAGNOSIS — J4520 Mild intermittent asthma, uncomplicated: Secondary | ICD-10-CM | POA: Insufficient documentation

## 2016-06-10 DIAGNOSIS — R1032 Left lower quadrant pain: Secondary | ICD-10-CM | POA: Diagnosis present

## 2016-06-10 LAB — URINALYSIS, COMPLETE (UACMP) WITH MICROSCOPIC
Bacteria, UA: NONE SEEN
Bilirubin Urine: NEGATIVE
Glucose, UA: NEGATIVE mg/dL
Ketones, ur: NEGATIVE mg/dL
Leukocytes, UA: NEGATIVE
Nitrite: NEGATIVE
Protein, ur: NEGATIVE mg/dL
Specific Gravity, Urine: 1.02 (ref 1.005–1.030)
pH: 5 (ref 5.0–8.0)

## 2016-06-10 LAB — BASIC METABOLIC PANEL
Anion gap: 7 (ref 5–15)
BUN: 16 mg/dL (ref 6–20)
CO2: 26 mmol/L (ref 22–32)
Calcium: 9.6 mg/dL (ref 8.9–10.3)
Chloride: 105 mmol/L (ref 101–111)
Creatinine, Ser: 0.86 mg/dL (ref 0.44–1.00)
GFR calc Af Amer: >60 mL/min (ref >60)
GFR calc non Af Amer: >60 mL/min (ref >60)
Glucose, Bld: 95 mg/dL (ref 65–99)
Potassium: 3.6 mmol/L (ref 3.5–5.1)
Sodium: 138 mmol/L (ref 135–145)

## 2016-06-10 LAB — CBC
HCT: 41.5 % (ref 35.0–47.0)
Hemoglobin: 14 g/dL (ref 12.0–16.0)
MCH: 31.2 pg (ref 26.0–34.0)
MCHC: 33.7 g/dL (ref 32.0–36.0)
MCV: 92.6 fL (ref 80.0–100.0)
Platelets: 239 10*3/uL (ref 150–440)
RBC: 4.49 MIL/uL (ref 3.80–5.20)
RDW: 13.5 % (ref 11.5–14.5)
WBC: 9.6 10*3/uL (ref 3.6–11.0)

## 2016-06-10 MED ORDER — SODIUM CHLORIDE 0.9 % IV BOLUS (SEPSIS)
1000.0000 mL | Freq: Once | INTRAVENOUS | Status: AC
  Administered 2016-06-10: 1000 mL via INTRAVENOUS

## 2016-06-10 MED ORDER — ONDANSETRON HCL 4 MG/2ML IJ SOLN
4.0000 mg | Freq: Once | INTRAMUSCULAR | Status: AC | PRN
  Administered 2016-06-10: 4 mg via INTRAVENOUS
  Filled 2016-06-10: qty 2

## 2016-06-10 MED ORDER — KETOROLAC TROMETHAMINE 30 MG/ML IJ SOLN
INTRAMUSCULAR | Status: AC
  Filled 2016-06-10: qty 1

## 2016-06-10 MED ORDER — TAMSULOSIN HCL 0.4 MG PO CAPS: 0.4000 mg | ORAL_CAPSULE | Freq: Every day | ORAL | 0 refills | Status: DC

## 2016-06-10 MED ORDER — KETOROLAC TROMETHAMINE 30 MG/ML IJ SOLN
30.0000 mg | Freq: Once | INTRAMUSCULAR | Status: AC
  Administered 2016-06-10: 30 mg via INTRAVENOUS

## 2016-06-10 MED ORDER — PHENAZOPYRIDINE HCL 200 MG PO TABS: 200.0000 mg | ORAL_TABLET | Freq: Three times a day (TID) | ORAL | 0 refills | Status: DC | PRN

## 2016-06-10 NOTE — ED Provider Notes (Signed)
[facility] Emergency Department Provider Note   ____________________________________________   I have reviewed the triage vital signs and the nursing notes.   HISTORY  Chief Complaint Flank Pain   History limited by: Not Limited   HPI [name] is a 36 y.o. female who presents to the emergency department today with continued lower abdominal pain. Patient was seen at outside emergency department 3 days ago and diagnosed with a left ureteral stone. Patient states that she has continued to have pain. She states she has also had nausea and has not been able to keep food or liquids down. She states she has follow-up appointment tomorrow with urology.  Past Medical History:  Diagnosis Date  . Acute pyelonephritis 06/29/2012  . Anxiety   . Asthma   . Gallstones   . Kidney stones    bilateral  . Migraine   . Pelvic pain in female   . Pyelonephritis 2015  . Renal mass    right  . Tobacco abuse     Patient Active Problem List   Diagnosis Date Noted  . IBS (irritable bowel syndrome) 02/02/2015  . Herpes simplex 02/02/2015  . Asthma, mild intermittent 11/03/2014  . Migraine without aura or status migrainosus 11/03/2014  . Gastroesophageal reflux disease without esophagitis 11/03/2014  . Generalized anxiety disorder 11/03/2014  . Other and unspecified ovarian cyst 08/06/2013  . Left lower quadrant pain 07/16/2013  . Appendicolith 07/16/2013  . Chronic constipation 06/30/2012  . Gross hematuria 06/30/2012  . History of pyelonephritis 06/29/2012  . Nephrolithiasis 06/28/2012    Past Surgical History:  Procedure Laterality Date  . ABDOMINAL HYSTERECTOMY  2006  . ANKLE ARTHROSCOPY Left   . APPENDECTOMY  07/26/13  . CAST APPLICATION  12/25/2011   Procedure: CAST APPLICATION;  Surgeon: Javier Docker, MD;  Location: [facility];  Service: Orthopedics;  Laterality: Left;  . CHOLECYSTECTOMY  03/11/2011   Procedure: LAPAROSCOPIC CHOLECYSTECTOMY WITH  INTRAOPERATIVE CHOLANGIOGRAM;  Surgeon: Atilano Ina, MD;  Location[facility] OR;  Service: General;  Laterality: N/A;  . CHOLECYSTECTOMY, LAPAROSCOPIC    . LITHOTRIPSY     x 4  . ORIF ANKLE FRACTURE  12/25/2011   Procedure: OPEN REDUCTION INTERNAL FIXATION (ORIF) ANKLE FRACTURE;  Surgeon: Javier Docker, MD;  Location: [facility] ORS;  Service: Orthopedics;  Laterality: Left;  . TONSILLECTOMY      Prior to Admission medications   Medication Sig Start Date End Date Taking? Authorizing Provider  ALPRAZolam (XANAX) 1 MG tablet TAKE 1 TABLET BY MOUTH EVERY DAY AT BEDTIME AND 1/2 TABLET DAILY AS NEEDED Patient taking differently: TAKE 1 TABLET BY MOUTH EVERY DAY AT BEDTIME AND 1/2 TABLET DAILY AS NEEDED FOR ANXIETY 04/11/16   [name], PA-C  baclofen (LIORESAL) 10 MG tablet Take 5-10 mg by mouth 3 (three) times daily as needed for migraine. 05/15/16   Historical Provider, MD  chlorproMAZINE (THORAZINE) 25 MG tablet Take 25-50 mg by mouth every 4 (four) hours as needed for migraine. 05/15/16   Historical Provider, MD  cyclobenzaprine (FLEXERIL) 10 MG tablet Take 10 mg by mouth 3 (three) times daily as needed for spasms. 06/04/16   Historical Provider, MD  HYDROcodone-acetaminophen (NORCO) 5-325 MG tablet Take 1 tablet by mouth every 6 (six) hours as needed for moderate pain. 06/07/16   Chase Picket Ward, PA-C  ibuprofen (ADVIL,MOTRIN) 800 MG tablet Take 1 tablet (800 mg total) by mouth every 8 (eight) hours as needed for moderate pain. 06/07/16   Chase Picket  Ward, PA-C  methylPREDNISolone (MEDROL DOSEPAK) 4 MG TBPK tablet Take 4-24 mg by mouth daily. 06/04/16   Historical Provider, MD  Multiple Vitamin (MULTIVITAMIN WITH MINERALS) TABS tablet Take 1 tablet by mouth daily.    Historical Provider, MD  omeprazole (PRILOSEC) 20 MG capsule Take 1 capsule (20 mg total) by mouth daily. 05/17/16   [name], PA-C  ondansetron (ZOFRAN ODT) 4 MG disintegrating tablet Take 1 tablet (4 mg total) by mouth  every 8 (eight) hours as needed for nausea or vomiting. 06/07/16   Chase Picket Ward, PA-C  promethazine (PHENERGAN) 25 MG tablet TAKE 1 TABLET (25 MG TOTAL) BY MOUTH EVERY 6 (SIX) HOURS AS NEEDED FOR NAUSEA OR VOMITING. 04/11/16   [name], PA-C  valACYclovir (VALTREX) 500 MG tablet Take 1-2 tablets (500-1,000 mg total) by mouth 2 (two) times daily as needed. Patient taking differently: Take 500-1,000 mg by mouth 2 (two) times daily as needed (cold sores).  02/02/15   Alba Cory, MD  zonisamide (ZONEGRAN) 100 MG capsule Take 200 mg by mouth at bedtime. 03/08/16   Historical Provider, MD    Allergies Compazine and Metoclopramide hcl  Family History  Problem Relation Age of Onset  . Diabetes Mother   . Diabetes Father   . Heart failure Father   . Hypertension Father   . Pulmonary fibrosis Father   . Nephrolithiasis Father     Social History Social History  Substance Use Topics  . Smoking status: Former Smoker    Packs/day: 0.50    Years: 15.00    Types: Cigarettes    Start date: 11/03/1999    Quit date: 09/26/2014  . Smokeless tobacco: Never Used  . Alcohol use 0.0 oz/week     Comment: social-rare    Review of Systems  Constitutional: Negative for fever. Cardiovascular: Negative for chest pain. Respiratory: Negative for shortness of breath. Gastrointestinal: Positive for abdominal pain. Genitourinary: Negative for dysuria. Musculoskeletal: Negative for back pain. Skin: Negative for rash. Neurological: Negative for headaches, focal weakness or numbness.  10-point ROS otherwise negative.  ____________________________________________   PHYSICAL EXAM:  VITAL SIGNS: ED Triage Vitals  Enc Vitals Group     BP 06/10/16 1446 110/79     Pulse Rate 06/10/16 1446 83     Resp 06/10/16 1446 18     Temp 06/10/16 1446 98.5 F (36.9 C)     Temp Source 06/10/16 1446 Oral     SpO2 06/10/16 1446 100 %     Weight 06/10/16 1446 180 lb (81.6 kg)     Height 06/10/16 1446   (1.702 m)     Head Circumference --      Peak Flow --      Pain Score 06/10/16 1445 8    Constitutional: Alert and oriented. Well appearing and in no distress. Eyes: Conjunctivae are normal. Normal extraocular movements. ENT   Head: Normocephalic and atraumatic.   Nose: No congestion/rhinnorhea.   Mouth/Throat: Mucous membranes are moist.   Neck: No stridor. Hematological/Lymphatic/Immunilogical: No cervical lymphadenopathy. Cardiovascular: Normal rate, regular rhythm.  No murmurs, rubs, or gallops.  Respiratory: Normal respiratory effort without tachypnea nor retractions. Breath sounds are clear and equal bilaterally. No wheezes/rales/rhonchi. Gastrointestinal: Soft and tender in the lower abdomen. No rebound. No guarding.  Genitourinary: Deferred Musculoskeletal: Normal range of motion in all extremities. No lower extremity edema. Neurologic:  Normal speech and language. No gross focal neurologic deficits are appreciated.  Skin:  Skin is warm, dry and intact. No  rash noted. Psychiatric: Mood and affect are normal. Speech and behavior are normal. Patient exhibits appropriate insight and judgment.  ____________________________________________    LABS (pertinent positives/negatives)  Labs Reviewed  URINALYSIS, COMPLETE (UACMP) WITH MICROSCOPIC - Abnormal; Notable for the following:       Result Value   Color, Urine YELLOW (*)    APPearance HAZY (*)    Hgb urine dipstick MODERATE (*)    Squamous Epithelial / LPF 0-5 (*)    All other components within normal limits  BASIC METABOLIC PANEL  CBC     ____________________________________________   EKG  None  ____________________________________________    RADIOLOGY  US renal IMPRESSION: 1. Bilateral renal calculi. 2. No hydronephrosis.  ____________________________________________   PROCEDURES  Procedures  ____________________________________________   INITIAL IMPRESSION / ASSESSMENT AND  PLAN / ED COURSE  Pertinent labs & imaging results that were available during my care of the patient were reviewed by me and considered in my medical decision making (see chart for details).  Patient with continued pain after diagnosis of kidney stone couple of days ago at outside emergency Department. Ultrasound here without any signs of hydronephrosis. No signs of infection in the UA. Will add on Flomax and Pyridium to the patient's medications. She has urology follow-up tomorrow.  ____________________________________________   FINAL CLINICAL IMPRESSION(S) / ED DIAGNOSES  Final diagnoses:  Kidney stone     Note: This dictation was prepared with Dragon dictation. Any transcriptional errors that result from this process are unintentional     Phineas Semen, MD 06/10/16 1732

## 2016-06-10 NOTE — Discharge Instructions (Signed)
Please seek medical attention for any high fevers, chest pain, shortness of breath, change in behavior, persistent vomiting, bloody stool or any other new or concerning symptoms.  

## 2016-06-10 NOTE — ED Notes (Signed)
Patient transported to Ultrasound 

## 2016-06-10 NOTE — ED Triage Notes (Signed)
States she was diagnosed with a kidney stone in her left uterter Thursday at [facility], states continued lower abd pain and nausea with vomiting

## 2016-06-11 ENCOUNTER — Ambulatory Visit: Payer: BLUE CROSS/BLUE SHIELD | Admitting: Urology

## 2016-06-11 ENCOUNTER — Other Ambulatory Visit: Payer: Self-pay | Admitting: Radiology

## 2016-06-11 ENCOUNTER — Encounter: Payer: Self-pay | Admitting: Urology

## 2016-06-11 VITALS — BP 122/83 | HR 80 | Ht 67.0 in | Wt 180.0 lb

## 2016-06-11 DIAGNOSIS — N200 Calculus of kidney: Secondary | ICD-10-CM

## 2016-06-11 DIAGNOSIS — N201 Calculus of ureter: Secondary | ICD-10-CM

## 2016-06-11 MED ORDER — HYDROCODONE-ACETAMINOPHEN 5-325 MG PO TABS: 1.0000 | ORAL_TABLET | Freq: Four times a day (QID) | ORAL | 0 refills | Status: DC | PRN

## 2016-06-11 NOTE — Progress Notes (Signed)
06/11/2016 7:40 AM   [name] October 11, 1980 [id number]  Referring provider: [name], PA-C [address] [place], [place] [address]  Chief Complaint  Patient presents with  . Nephrolithiasis    ER follow up    HPI: 36 year old female with a history of kidney stones who returns today after several recent emergency room visits, first on 06/07/2016 and 1 again yesterday. CT abdomen and pelvis on 06/07/2016 shows a left mid ureteral calculus measuring 3 mm WITHOUT hydronephrosis. She is also noted to have bilateral nonobstructing calculi measuring up to 5 mm bilaterally.  Follow up renal ultrasound yesterday shows bilateral nonobstructing calculi without hydronephrosis.  She continues to have left flank pain which comes and goes.  She is been taking narcotics off and on since her initial emergency room visit.  She was previously followed by Karena Addison, NP in our clinic ~ 2 years ago.  She has had a previous stone analysis and 24-hour urine but the records are not available to me today.  She was started on potassium citrate for kidney stone prevention but not able to tolerate this medication due to stomach upset.  She takes a medication every other day as possible.  She has had ESWL x 4 and ureteroscopy/ stent in the past.  Her most recent procedure ~ 2years ago or more.    Her pain today is much milder but present.  No nausea or vominting.  No fevers, chills, dysuria, or gross hematuria.     PMH: Past Medical History:  Diagnosis Date  . Acute pyelonephritis 06/29/2012  . Anxiety   . Asthma   . Gallstones   . Kidney stones    bilateral  . Migraine   . Pelvic pain in female   . Pyelonephritis 2015  . Renal mass    right  . Tobacco abuse     Surgical History: Past Surgical History:  Procedure Laterality Date  . ABDOMINAL HYSTERECTOMY  2006  . ANKLE ARTHROSCOPY Left   . APPENDECTOMY  07/26/13  . CAST APPLICATION  12/25/2011   Procedure:  CAST APPLICATION;  Surgeon: Javier Docker, MD;  Location: [facility];  Service: Orthopedics;  Laterality: Left;  . CHOLECYSTECTOMY  03/11/2011   Procedure: LAPAROSCOPIC CHOLECYSTECTOMY WITH INTRAOPERATIVE CHOLANGIOGRAM;  Surgeon: Atilano Ina, MD;  Location: [facility] OR;  Service: General;  Laterality: N/A;  . CHOLECYSTECTOMY, LAPAROSCOPIC    . LITHOTRIPSY     x 4  . ORIF ANKLE FRACTURE  12/25/2011   Procedure: OPEN REDUCTION INTERNAL FIXATION (ORIF) ANKLE FRACTURE;  Surgeon: Javier Docker, MD;  Location: [facility];  Service: Orthopedics;  Laterality: Left;  . TONSILLECTOMY      Home Medications:  Allergies as of 06/11/2016      Reactions   Compazine Other (See Comments)   Dystonia   Metoclopramide Hcl Other (See Comments)   dystonia      Medication List       Accurate as of 06/11/16 11:59 PM. Always use your most recent med list.          ALPRAZolam 1 MG tablet Commonly known as:  XANAX TAKE 1 TABLET BY MOUTH EVERY DAY AT BEDTIME AND 1/2 TABLET DAILY AS NEEDED   baclofen 10 MG tablet Commonly known as:  LIORESAL Take 5-10 mg by mouth 3 (three) times daily as needed for migraine.   chlorproMAZINE 25 MG tablet Commonly known as:  THORAZINE Take 25-50 mg by mouth every 4 (four) hours as needed for  migraine.   cyclobenzaprine 10 MG tablet Commonly known as:  FLEXERIL Take 10 mg by mouth 3 (three) times daily as needed for spasms.   HYDROcodone-acetaminophen 5-325 MG tablet Commonly known as:  NORCO Take 1 tablet by mouth every 6 (six) hours as needed for moderate pain.   ibuprofen 800 MG tablet Commonly known as:  ADVIL,MOTRIN Take 1 tablet (800 mg total) by mouth every 8 (eight) hours as needed for moderate pain.   multivitamin with minerals Tabs tablet Take 1 tablet by mouth daily.   omeprazole 20 MG capsule Commonly known as:  PRILOSEC Take 1 capsule (20 mg total) by mouth daily.   ondansetron 4 MG disintegrating tablet Commonly known as:  ZOFRAN ODT Take 1 tablet (4  mg total) by mouth every 8 (eight) hours as needed for nausea or vomiting.   phenazopyridine 200 MG tablet Commonly known as:  PYRIDIUM Take 1 tablet (200 mg total) by mouth 3 (three) times daily as needed for pain.   promethazine 25 MG tablet Commonly known as:  PHENERGAN TAKE 1 TABLET (25 MG TOTAL) BY MOUTH EVERY 6 (SIX) HOURS AS NEEDED FOR NAUSEA OR VOMITING.   tamsulosin 0.4 MG Caps capsule Commonly known as:  FLOMAX Take 1 capsule (0.4 mg total) by mouth daily.   valACYclovir 500 MG tablet Commonly known as:  VALTREX Take 1-2 tablets (500-1,000 mg total) by mouth 2 (two) times daily as needed.   zonisamide 100 MG capsule Commonly known as:  ZONEGRAN Take 200 mg by mouth at bedtime.       Allergies:  Allergies  Allergen Reactions  . Compazine Other (See Comments)    Dystonia  . Metoclopramide Hcl Other (See Comments)    dystonia    Family History: Family History  Problem Relation Age of Onset  . Diabetes Mother   . Diabetes Father   . Heart failure Father   . Hypertension Father   . Pulmonary fibrosis Father   . Nephrolithiasis Father     Social History:  reports that she quit smoking about 20 months ago. Her smoking use included Cigarettes. She started smoking about 16 years ago. She has a 7.50 pack-year smoking history. She has never used smokeless tobacco. She reports that she drinks alcohol. She reports that she does not use drugs.  ROS: UROLOGY Frequent Urination?: Yes Hard to postpone urination?: No Burning/pain with urination?: Yes Get up at night to urinate?: No Leakage of urine?: No Urine stream starts and stops?: No Trouble starting stream?: No Do you have to strain to urinate?: No Blood in urine?: Yes Urinary tract infection?: No Sexually transmitted disease?: No Injury to kidneys or bladder?: No Painful intercourse?: No Weak stream?: No Currently pregnant?: No Vaginal bleeding?: No Last menstrual period?: n  Gastrointestinal Nausea?:  No Vomiting?: No Indigestion/heartburn?: No Diarrhea?: No Constipation?: No  Constitutional Fever: No Night sweats?: No Weight loss?: No Fatigue?: No  Skin Skin rash/lesions?: No Itching?: No  Eyes Blurred vision?: No Double vision?: No  Ears/Nose/Throat Sore throat?: No Sinus problems?: No  Hematologic/Lymphatic Swollen glands?: No Easy bruising?: No  Cardiovascular Leg swelling?: No Chest pain?: No  Respiratory Cough?: No Shortness of breath?: No  Endocrine Excessive thirst?: No  Musculoskeletal Back pain?: No Joint pain?: No  Neurological Headaches?: No Dizziness?: No  Psychologic Depression?: No Anxiety?: No  Physical Exam: BP 122/83   Pulse 80   Ht  (1.702 m)   Wt 180 lb (81.6 kg)   BMI 28.19 kg/m  Constitutional:  Alert and oriented, No acute distress. HEENT: [place] AT, moist mucus membranes.  Trachea midline, no masses. Cardiovascular: No clubbing, cyanosis, or edema. Respiratory: Normal respiratory effort, no increased work of breathing. GI: Abdomen is soft, nontender, nondistended, no abdominal masses GU: Mild left CVA tenderness. Skin: No rashes, bruises or suspicious lesions. Neurologic: Grossly intact, no focal deficits, moving all 4 extremities. Psychiatric: Normal mood and affect.  Laboratory Data: Lab Results  Component Value Date   WBC 9.6 06/10/2016   HGB 14.0 06/10/2016   HCT 41.5 06/10/2016   MCV 92.6 06/10/2016   PLT 239 06/10/2016    Lab Results  Component Value Date   CREATININE 0.86 06/10/2016    Urinalysis Results for orders placed or performed during the hospital encounter of 06/10/16  Urinalysis, Complete w Microscopic  Result Value Ref Range   Color, Urine YELLOW (A) YELLOW   APPearance HAZY (A) CLEAR   Specific Gravity, Urine 1.020 1.005 - 1.030   pH 5.0 5.0 - 8.0   Glucose, UA NEGATIVE NEGATIVE mg/dL   Hgb urine dipstick MODERATE (A) NEGATIVE   Bilirubin Urine NEGATIVE NEGATIVE   Ketones, ur  NEGATIVE NEGATIVE mg/dL   Protein, ur NEGATIVE NEGATIVE mg/dL   Nitrite NEGATIVE NEGATIVE   Leukocytes, UA NEGATIVE NEGATIVE   RBC / HPF 6-30 0 - 5 RBC/hpf   WBC, UA 0-5 0 - 5 WBC/hpf   Bacteria, UA NONE SEEN NONE SEEN   Squamous Epithelial / LPF 0-5 (A) NONE SEEN   Mucous PRESENT    Pertinent Imaging: CLINICAL DATA:  Flank pain  EXAM: CT ABDOMEN AND PELVIS WITHOUT CONTRAST  TECHNIQUE: Multidetector CT imaging of the abdomen and pelvis was performed following the standard protocol without IV contrast.  COMPARISON:  CT abdomen pelvis 05/15/2015  FINDINGS: Lower chest: No pulmonary nodules or pleural effusion. No visible pericardial effusion.  Hepatobiliary: Status post cholecystectomy.  Unremarkable liver.  Pancreas: Normal noncontrast appearance of the pancreas. No peripancreatic fluid collection.  Spleen: Normal.  Adrenal glands: Normal.  Urinary Tract:  --Right kidney/ureter: There are multiple right renal calculi, measuring up to 5 mm. No hydronephrosis or perinephric stranding. No ureteral obstruction.  --Left kidney/ureter: There are multiple left-sided renal calculi measuring up to 5 mm. No hydronephrosis or perinephric stranding. Within the midportion of the left ureter there is a calculus measuring 3 x 3 mm. The ureter is not dilated.  --Urinary bladder: Unremarkable.  Stomach/Bowel: No dilated loops of bowel. No evidence of colonic or enteric inflammation. No fluid collection within the abdomen.  Vascular/Lymphatic: No abdominal aortic aneurysm or atherosclerotic calcification. No abdominal or pelvic lymphadenopathy.  Reproductive: Status post hysterectomy.  Normal ovaries.  Musculoskeletal. No focal osseous lesion. Normal visualized extraperitoneal and extrathoracic soft tissues.  IMPRESSION: 1. Left ureteral calculus within the mid ureter measuring 3 x 3 mm. No associated hydronephrosis or perinephric stranding. 2. Multiple  bilateral nonobstructing renal calculi measuring up to 5 mm.   Electronically Signed   By: Deatra Robinson M.D.   On: 06/07/2016 00:44    CT scan personally reviewed  Assessment & Plan:    1. Left ureteral stone Left flank pain with a 3 mm left mid ureteral stone as well as multiple nonobstructing left intrarenal calculi. Symptomatic without signs or symptoms of infection.   Continued observation with medical expulsive therapy versus ureteroscopy were discussed. Given her multiple nonobstructing stones on that side, she is most interested in ureteroscopy to treat all of her stone burden on the left.  Risks and benefits of ureteroscopy were reviewed including but not limited to infection, bleeding, pain, ureteral injury which could require open surgery versus prolonged indwelling if ureteralperforation occurs, persistent stone disease, requirement for staged procedure, possible stent, and global anesthesia risks. Patient expressed understanding and desires to proceed with ureteroscopy.  - CULTURE, URINE COMPREHENSIVE  2. Bilateral nephrolithiasis Multiple bilateral calculi measuring up to 5 mm  3. Recurrent nephrolithiasis We'll attempt to get records from previous 24-hour urine, may need to adjust stone prevention medications/intervention   Return for schedule left ureteroscopy.  Vanna Scotland, MD  [facility] [address], [address] [place], [place] [address] [phone]  I spent 25 min with this patient of which greater than 50% was spent in counseling and coordination of care with the patient.

## 2016-06-12 ENCOUNTER — Telehealth: Payer: Self-pay | Admitting: Radiology

## 2016-06-12 NOTE — Telephone Encounter (Signed)
Unable to LM, vm not set up. Need to discuss surgery information.

## 2016-06-13 NOTE — Telephone Encounter (Signed)
Notified pt of surgery scheduled with Dr Apolinar Junes on 06/18/16, pre-admit testing phone interview on 06/14/16 between 9am-1pm & to call day prior to surgery for arrival time to [facility]. Questions answered. Pt voices understanding.

## 2016-06-14 ENCOUNTER — Encounter: Payer: Self-pay | Admitting: Urology

## 2016-06-14 ENCOUNTER — Encounter
Admission: RE | Admit: 2016-06-14 | Discharge: 2016-06-14 | Disposition: A | Discharge status: Home / Self Care | Payer: BLUE CROSS/BLUE SHIELD | Source: Ambulatory Visit | Attending: Urology | Admitting: Urology

## 2016-06-14 ENCOUNTER — Other Ambulatory Visit: Payer: Self-pay | Admitting: Urology

## 2016-06-14 ENCOUNTER — Telehealth: Payer: Self-pay

## 2016-06-14 HISTORY — DX: Personal history of urinary calculi: Z87.442

## 2016-06-14 HISTORY — DX: Other specified postprocedural states: Z98.890

## 2016-06-14 HISTORY — DX: Other specified postprocedural states: R11.2

## 2016-06-14 HISTORY — DX: Adverse effect of unspecified anesthetic, initial encounter: T41.45XA

## 2016-06-14 HISTORY — DX: Gastro-esophageal reflux disease without esophagitis: K21.9

## 2016-06-14 HISTORY — DX: Other complications of anesthesia, initial encounter: T88.59XA

## 2016-06-14 LAB — CULTURE, URINE COMPREHENSIVE

## 2016-06-14 MED ORDER — HYDROCODONE-ACETAMINOPHEN 5-325 MG PO TABS: 1.0000 | ORAL_TABLET | Freq: Four times a day (QID) | ORAL | 0 refills | Status: DC | PRN

## 2016-06-14 NOTE — Patient Instructions (Signed)
  Your procedure is scheduled on: 06-18-16 Report to Same Day Surgery 2nd floor medical mall [facility] Entrance-take elevator on left to 2nd floor.  Check in with surgery information desk.) To find out your arrival time please call [phone] between 1PM - 3PM on 06-17-16  Remember: Instructions that are not followed completely may result in serious medical risk, up to and including death, or upon the discretion of your surgeon and anesthesiologist your surgery may need to be rescheduled.    _x___ 1. Do not eat food or drink liquids after midnight. No gum chewing or                              hard candies.     __x__ 2. No Alcohol for 24 hours before or after surgery.   __x__3. No Smoking for 24 prior to surgery.   ____  4. Bring all medications with you on the day of surgery if instructed.    __x__ 5. Notify your doctor if there is any change in your medical condition     (cold, fever, infections).     Do not wear jewelry, make-up, hairpins, clips or nail polish.  Do not wear lotions, powders, or perfumes. You may wear deodorant.  Do not shave 48 hours prior to surgery. Men may shave face and neck.  Do not bring valuables to the hospital.    [facility] is not responsible for any belongings or valuables.               Contacts, dentures or bridgework may not be worn into surgery.  Leave your suitcase in the car. After surgery it may be brought to your room.  For patients admitted to the hospital, discharge time is determined by your                       treatment team.   Patients discharged the day of surgery will not be allowed to drive home.  You will need someone to drive you home and stay with you the night of your procedure.    Please read over the following fact sheets that you were given:   [facility] Preparing for Surgery and or MRSA Information   _x___ Take anti-hypertensive (unless it includes a diuretic), cardiac, seizure, asthma,     anti-reflux and psychiatric  medicines. These include:  1. OMEPRAZOLE  2. MAY TAKE HYDROCODONE IF NEEDED   3.  4.  5.  6.  ____Fleets enema or Magnesium Citrate as directed.   ____ Use CHG Soap or sage wipes as directed on instruction sheet   ____ Use inhalers on the day of surgery and bring to hospital day of surgery  ____ Stop Metformin and Janumet 2 days prior to surgery.    ____ Take 1/2 of usual insulin dose the night before surgery and none on the morning     surgery.   ____ Follow recommendations from Cardiologist, Pulmonologist or PCP regarding          stopping Aspirin, Coumadin, Pllavix ,Eliquis, Effient, or Pradaxa, and Pletal.  X____Stop Anti-inflammatories such as Advil, Aleve, Ibuprofen, Motrin, Naproxen, Naprosyn, Goodies powders or aspirin products NOW-OK to take Tylenol OR HYDROCODONE IF NEEDED   ____ Stop supplements until after surgery.    ____ Bring C-Pap to the hospital.

## 2016-06-14 NOTE — Telephone Encounter (Signed)
Pt sent a mychart message requesting more pain medication. Attempted to call pt but was unable to leave a message. Sent pt a mychart message making aware a pain script has been left up at the front.

## 2016-06-15 ENCOUNTER — Observation Stay
Admission: EM | Admit: 2016-06-15 | Discharge: 2016-06-17 | Disposition: A | Discharge status: Home / Self Care | Payer: BLUE CROSS/BLUE SHIELD | Attending: Internal Medicine | Admitting: Internal Medicine

## 2016-06-15 ENCOUNTER — Emergency Department: Payer: BLUE CROSS/BLUE SHIELD

## 2016-06-15 ENCOUNTER — Encounter: Payer: Self-pay | Admitting: Emergency Medicine

## 2016-06-15 DIAGNOSIS — J45909 Unspecified asthma, uncomplicated: Secondary | ICD-10-CM | POA: Diagnosis not present

## 2016-06-15 DIAGNOSIS — Z87442 Personal history of urinary calculi: Secondary | ICD-10-CM | POA: Diagnosis not present

## 2016-06-15 DIAGNOSIS — R52 Pain, unspecified: Secondary | ICD-10-CM

## 2016-06-15 DIAGNOSIS — R110 Nausea: Secondary | ICD-10-CM

## 2016-06-15 DIAGNOSIS — Z888 Allergy status to other drugs, medicaments and biological substances status: Secondary | ICD-10-CM | POA: Diagnosis not present

## 2016-06-15 DIAGNOSIS — J4520 Mild intermittent asthma, uncomplicated: Secondary | ICD-10-CM | POA: Insufficient documentation

## 2016-06-15 DIAGNOSIS — Z87891 Personal history of nicotine dependence: Secondary | ICD-10-CM | POA: Diagnosis not present

## 2016-06-15 DIAGNOSIS — Z79899 Other long term (current) drug therapy: Secondary | ICD-10-CM | POA: Diagnosis not present

## 2016-06-15 DIAGNOSIS — R1032 Left lower quadrant pain: Secondary | ICD-10-CM | POA: Diagnosis not present

## 2016-06-15 DIAGNOSIS — N23 Unspecified renal colic: Secondary | ICD-10-CM

## 2016-06-15 DIAGNOSIS — R102 Pelvic and perineal pain: Secondary | ICD-10-CM | POA: Diagnosis not present

## 2016-06-15 DIAGNOSIS — N202 Calculus of kidney with calculus of ureter: Secondary | ICD-10-CM | POA: Diagnosis not present

## 2016-06-15 DIAGNOSIS — N200 Calculus of kidney: Secondary | ICD-10-CM | POA: Diagnosis not present

## 2016-06-15 DIAGNOSIS — K219 Gastro-esophageal reflux disease without esophagitis: Secondary | ICD-10-CM | POA: Insufficient documentation

## 2016-06-15 LAB — CBC WITH DIFFERENTIAL/PLATELET
Basophils Absolute: 0.1 10*3/uL (ref 0–0.1)
Basophils Relative: 1 %
Eosinophils Absolute: 0.1 10*3/uL (ref 0–0.7)
Eosinophils Relative: 2 %
HCT: 40.8 % (ref 35.0–47.0)
Hemoglobin: 13.8 g/dL (ref 12.0–16.0)
Lymphocytes Relative: 37 %
Lymphs Abs: 2.9 10*3/uL (ref 1.0–3.6)
MCH: 31.8 pg (ref 26.0–34.0)
MCHC: 33.8 g/dL (ref 32.0–36.0)
MCV: 93.9 fL (ref 80.0–100.0)
Monocytes Absolute: 0.5 10*3/uL (ref 0.2–0.9)
Monocytes Relative: 6 %
Neutro Abs: 4.3 10*3/uL (ref 1.4–6.5)
Neutrophils Relative %: 54 %
Platelets: 200 10*3/uL (ref 150–440)
RBC: 4.35 MIL/uL (ref 3.80–5.20)
RDW: 13.4 % (ref 11.5–14.5)
WBC: 7.9 10*3/uL (ref 3.6–11.0)

## 2016-06-15 LAB — URINALYSIS, COMPLETE (UACMP) WITH MICROSCOPIC
Bacteria, UA: NONE SEEN
Bilirubin Urine: NEGATIVE
Glucose, UA: NEGATIVE mg/dL
Ketones, ur: NEGATIVE mg/dL
Leukocytes, UA: NEGATIVE
Nitrite: NEGATIVE
Protein, ur: NEGATIVE mg/dL
Specific Gravity, Urine: 1.014 (ref 1.005–1.030)
pH: 7 (ref 5.0–8.0)

## 2016-06-15 LAB — BASIC METABOLIC PANEL
Anion gap: 6 (ref 5–15)
BUN: 12 mg/dL (ref 6–20)
CO2: 26 mmol/L (ref 22–32)
Calcium: 9 mg/dL (ref 8.9–10.3)
Chloride: 108 mmol/L (ref 101–111)
Creatinine, Ser: 0.83 mg/dL (ref 0.44–1.00)
GFR calc Af Amer: >60 mL/min (ref >60)
GFR calc non Af Amer: >60 mL/min (ref >60)
Glucose, Bld: 87 mg/dL (ref 65–99)
Potassium: 3.8 mmol/L (ref 3.5–5.1)
Sodium: 140 mmol/L (ref 135–145)

## 2016-06-15 MED ORDER — HYDROCODONE-ACETAMINOPHEN 5-325 MG PO TABS
1.0000 | ORAL_TABLET | ORAL | Status: DC | PRN
  Administered 2016-06-15 – 2016-06-16 (×2): 2 via ORAL
  Administered 2016-06-16 (×2): 1 via ORAL
  Filled 2016-06-15: qty 1
  Filled 2016-06-15: qty 2
  Filled 2016-06-15: qty 1
  Filled 2016-06-15: qty 2

## 2016-06-15 MED ORDER — KETOROLAC TROMETHAMINE 30 MG/ML IJ SOLN
30.0000 mg | Freq: Four times a day (QID) | INTRAMUSCULAR | Status: DC | PRN
  Administered 2016-06-16 (×2): 30 mg via INTRAVENOUS
  Filled 2016-06-15 (×2): qty 1

## 2016-06-15 MED ORDER — KETOROLAC TROMETHAMINE 30 MG/ML IJ SOLN
15.0000 mg | INTRAMUSCULAR | Status: AC
  Administered 2016-06-15: 15 mg via INTRAVENOUS
  Filled 2016-06-15: qty 1

## 2016-06-15 MED ORDER — HYDROMORPHONE HCL 1 MG/ML IJ SOLN
1.0000 mg | INTRAMUSCULAR | Status: AC
  Administered 2016-06-15: 1 mg via INTRAVENOUS
  Filled 2016-06-15: qty 1

## 2016-06-15 MED ORDER — ACETAMINOPHEN 325 MG PO TABS
650.0000 mg | ORAL_TABLET | Freq: Four times a day (QID) | ORAL | Status: DC | PRN
  Administered 2016-06-16: 650 mg via ORAL
  Filled 2016-06-15: qty 2

## 2016-06-15 MED ORDER — TAMSULOSIN HCL 0.4 MG PO CAPS
0.4000 mg | ORAL_CAPSULE | Freq: Every day | ORAL | Status: DC
  Administered 2016-06-16 – 2016-06-17 (×2): 0.4 mg via ORAL
  Filled 2016-06-15 (×2): qty 1

## 2016-06-15 MED ORDER — ONDANSETRON HCL 4 MG PO TABS
4.0000 mg | ORAL_TABLET | Freq: Four times a day (QID) | ORAL | Status: DC | PRN
  Administered 2016-06-15: 4 mg via ORAL
  Filled 2016-06-15: qty 1

## 2016-06-15 MED ORDER — ONDANSETRON HCL 4 MG/2ML IJ SOLN
4.0000 mg | Freq: Four times a day (QID) | INTRAMUSCULAR | Status: DC | PRN
  Administered 2016-06-16 (×2): 4 mg via INTRAVENOUS
  Filled 2016-06-15 (×2): qty 2

## 2016-06-15 MED ORDER — PANTOPRAZOLE SODIUM 40 MG PO TBEC
40.0000 mg | DELAYED_RELEASE_TABLET | Freq: Every day | ORAL | Status: DC
  Administered 2016-06-16 – 2016-06-17 (×2): 40 mg via ORAL
  Filled 2016-06-15 (×2): qty 1

## 2016-06-15 MED ORDER — ALBUTEROL SULFATE (2.5 MG/3ML) 0.083% IN NEBU: 2.5000 mg | INHALATION_SOLUTION | RESPIRATORY_TRACT | Status: DC | PRN

## 2016-06-15 MED ORDER — SODIUM CHLORIDE 0.9 % IV SOLN
INTRAVENOUS | Status: DC
  Administered 2016-06-16 – 2016-06-17 (×3): via INTRAVENOUS

## 2016-06-15 MED ORDER — HYDROMORPHONE HCL 1 MG/ML IJ SOLN
1.0000 mg | Freq: Once | INTRAMUSCULAR | Status: AC
  Administered 2016-06-15: 1 mg via INTRAVENOUS
  Filled 2016-06-15: qty 1

## 2016-06-15 MED ORDER — ACETAMINOPHEN 650 MG RE SUPP: 650.0000 mg | Freq: Four times a day (QID) | RECTAL | Status: DC | PRN

## 2016-06-15 MED ORDER — ONDANSETRON HCL 4 MG/2ML IJ SOLN
4.0000 mg | Freq: Once | INTRAMUSCULAR | Status: AC
  Administered 2016-06-15: 4 mg via INTRAVENOUS
  Filled 2016-06-15: qty 2

## 2016-06-15 MED ORDER — FENTANYL CITRATE (PF) 100 MCG/2ML IJ SOLN
50.0000 ug | INTRAMUSCULAR | Status: AC | PRN
  Administered 2016-06-15 (×2): 50 ug via INTRAVENOUS
  Filled 2016-06-15: qty 2

## 2016-06-15 MED ORDER — ENOXAPARIN SODIUM 40 MG/0.4ML ~~LOC~~ SOLN
40.0000 mg | SUBCUTANEOUS | Status: DC
  Filled 2016-06-15: qty 0.4

## 2016-06-15 MED ORDER — ALPRAZOLAM 1 MG PO TABS
1.0000 mg | ORAL_TABLET | Freq: Every day | ORAL | Status: DC
  Administered 2016-06-15 – 2016-06-16 (×2): 1 mg via ORAL
  Filled 2016-06-15 (×2): qty 1

## 2016-06-15 MED ORDER — TAMSULOSIN HCL 0.4 MG PO CAPS
0.4000 mg | ORAL_CAPSULE | ORAL | Status: AC
  Administered 2016-06-15: 0.4 mg via ORAL
  Filled 2016-06-15: qty 1

## 2016-06-15 MED ORDER — SODIUM CHLORIDE 0.9 % IV BOLUS (SEPSIS)
1000.0000 mL | Freq: Once | INTRAVENOUS | Status: AC
  Administered 2016-06-15: 1000 mL via INTRAVENOUS

## 2016-06-15 NOTE — ED Triage Notes (Signed)
States has been diagnosed with kidney stones and scheduled for treatment in x 3 days here. Pain escalating since yesterday to severe.

## 2016-06-15 NOTE — H&P (Signed)
[employer] - [place] at [facility]   PATIENT NAME: [name]    MR#:  [id number]  DATE OF BIRTH:  03-05-1980  DATE OF ADMISSION:  06/15/2016  PRIMARY CARE PHYSICIAN: [name], PA-C   REQUESTING/REFERRING PHYSICIAN: Sharman Cheek, MD  CHIEF COMPLAINT:   Chief Complaint  Patient presents with  . Groin Pain    worsening groin pain with nausea for 2 days. HISTORY OF PRESENT ILLNESS:  [name]  is a 36 y.o. female with a known history of Acute pyelonephritis, asthma, GERD, kidney stones. She was sent by urologist due to groin pain. She had left lower quadrant abdominal pain for the past week. She was diagnosed with left ureterolithiasis 5 day ago. She followed up with urology and plans to have ureteroscopy in 3 days to relieve still inverted in the left ureter and renal system. She has a known 3 mm stone in the left ureter. She has had worsening groin pain with nausea for 2 days. PAST MEDICAL HISTORY:   Past Medical History:  Diagnosis Date  . Acute pyelonephritis 06/29/2012  . Anxiety   . Asthma    AS A CHILD  . Complication of anesthesia   . Gallstones   . GERD (gastroesophageal reflux disease)   . History of kidney stones   . Kidney stones    bilateral  . Migraine   . Pelvic pain in female   . PONV (postoperative nausea and vomiting)    WITH HYSTERECTOMY ONLY  . Pyelonephritis 2015  . Renal mass    right  . Tobacco abuse     PAST SURGICAL HISTORY:   Past Surgical History:  Procedure Laterality Date  . ABDOMINAL HYSTERECTOMY  2006  . ANKLE ARTHROSCOPY Left   . ANKLE ARTHROSCOPY Left 11/24/2015  . APPENDECTOMY  07/26/13  . CAST APPLICATION  12/25/2011   Procedure: CAST APPLICATION;  Surgeon: Javier Docker, MD;  Location: [facility];  Service: Orthopedics;  Laterality: Left;  . CHOLECYSTECTOMY  03/11/2011   Procedure: LAPAROSCOPIC CHOLECYSTECTOMY WITH INTRAOPERATIVE CHOLANGIOGRAM;  Surgeon: Atilano Ina, MD;  Location: [facility]  OR;  Service: General;  Laterality: N/A;  . CHOLECYSTECTOMY, LAPAROSCOPIC    . LITHOTRIPSY     x 4  . ORIF ANKLE FRACTURE  12/25/2011   Procedure: OPEN REDUCTION INTERNAL FIXATION (ORIF) ANKLE FRACTURE;  Surgeon: Javier Docker, MD;  Location: [facility];  Service: Orthopedics;  Laterality: Left;  . TONSILLECTOMY      SOCIAL HISTORY:   Social History  Substance Use Topics  . Smoking status: Former Smoker    Packs/day: 0.50    Years: 15.00    Types: Cigarettes    Start date: 11/03/1999    Quit date: 09/26/2014  . Smokeless tobacco: Never Used  . Alcohol use 0.0 oz/week     Comment: social-rare    FAMILY HISTORY:   Family History  Problem Relation Age of Onset  . Diabetes Mother   . Diabetes Father   . Heart failure Father   . Hypertension Father   . Pulmonary fibrosis Father   . Nephrolithiasis Father     DRUG ALLERGIES:   Allergies  Allergen Reactions  . Compazine Other (See Comments)    Dystonia  . Metoclopramide Hcl Other (See Comments)    dystonia    REVIEW OF SYSTEMS:   Review of Systems  Constitutional: Negative for chills, fever and malaise/fatigue.  HENT: Negative for congestion.   Eyes: Negative for blurred vision and double vision.  Respiratory: Negative for cough, shortness of breath and stridor.   Cardiovascular: Negative for chest pain and leg swelling.  Gastrointestinal: Positive for nausea. Negative for abdominal pain, blood in stool, diarrhea, melena and vomiting.  Genitourinary: Positive for dysuria and urgency. Negative for hematuria.       Groin area pain  Musculoskeletal: Negative for back pain.  Skin: Negative for itching and rash.  Neurological: Negative for dizziness, focal weakness, loss of consciousness, weakness and headaches.  Psychiatric/Behavioral: Negative for depression. The patient is not nervous/anxious.     MEDICATIONS AT HOME:   Prior to Admission medications   Medication Sig Start Date End Date Taking? Authorizing Provider    ALPRAZolam (XANAX) 1 MG tablet TAKE 1 TABLET BY MOUTH EVERY DAY AT BEDTIME AND 1/2 TABLET DAILY AS NEEDED Patient taking differently: TAKE 1 TABLET BY MOUTH EVERY DAY AT BEDTIME 04/11/16  Yes Alessandra Bevels Burnette, PA-C  baclofen (LIORESAL) 10 MG tablet Take 10 mg by mouth 2 (two) times daily as needed.  05/15/16  Yes Historical Provider, MD  chlorproMAZINE (THORAZINE) 25 MG tablet Take 25-50 mg by mouth every 4 (four) hours as needed (MIGRAINES).  05/15/16  Yes Historical Provider, MD  HYDROcodone-acetaminophen (NORCO) 5-325 MG tablet Take 1 tablet by mouth every 6 (six) hours as needed for moderate pain. 06/14/16  Yes Vanna Scotland, MD  Multiple Vitamin (MULTIVITAMIN WITH MINERALS) TABS tablet Take 1 tablet by mouth daily.   Yes Historical Provider, MD  omeprazole (PRILOSEC) 20 MG capsule Take 1 capsule (20 mg total) by mouth daily. Patient taking differently: Take 20 mg by mouth at bedtime.  05/17/16  Yes Alessandra Bevels Burnette, PA-C  promethazine (PHENERGAN) 25 MG tablet TAKE 1 TABLET (25 MG TOTAL) BY MOUTH EVERY 6 (SIX) HOURS AS NEEDED FOR NAUSEA OR VOMITING. 04/11/16  Yes Alessandra Bevels Burnette, PA-C  valACYclovir (VALTREX) 500 MG tablet Take 1-2 tablets (500-1,000 mg total) by mouth 2 (two) times daily as needed. Patient taking differently: Take 1,000 mg by mouth 2 (two) times daily as needed (cold sores).  02/02/15  Yes Alba Cory, MD  zonisamide (ZONEGRAN) 100 MG capsule Take 200 mg by mouth at bedtime. 03/08/16  Yes Historical Provider, MD  ibuprofen (ADVIL,MOTRIN) 800 MG tablet Take 1 tablet (800 mg total) by mouth every 8 (eight) hours as needed for moderate pain. Patient not taking: Reported on 06/12/2016 06/07/16   [facility] Ward, PA-C  ondansetron (ZOFRAN ODT) 4 MG disintegrating tablet Take 1 tablet (4 mg total) by mouth every 8 (eight) hours as needed for nausea or vomiting. Patient not taking: Reported on 06/12/2016 06/07/16   [facility] Ward, PA-C  phenazopyridine (PYRIDIUM) 200 MG  tablet Take 1 tablet (200 mg total) by mouth 3 (three) times daily as needed for pain. Patient not taking: Reported on 06/12/2016 06/10/16 06/10/17  Phineas Semen, MD  tamsulosin (FLOMAX) 0.4 MG CAPS capsule Take 1 capsule (0.4 mg total) by mouth daily. Patient not taking: Reported on 06/12/2016 06/10/16   Phineas Semen, MD      VITAL SIGNS:  Blood pressure (!) 154/138, pulse (!) 103, temperature 98 F (36.7 C), temperature source Oral, resp. rate 20, height  (1.702 m), weight 180 lb (81.6 kg), SpO2 100 %.  PHYSICAL EXAMINATION:  Physical Exam  GENERAL:  36 y.o.-year-old patient lying in the bed with no acute distress.  EYES: Pupils equal, round, reactive to light and accommodation. No scleral icterus. Extraocular muscles intact.  HEENT: Head atraumatic, normocephalic. Oropharynx and nasopharynx clear.  NECK:  Supple, no jugular venous distention. No thyroid enlargement, no tenderness.  LUNGS: Normal breath sounds bilaterally, no wheezing, rales,rhonchi or crepitation. No use of accessory muscles of respiration.  CARDIOVASCULAR: S1, S2 normal. No murmurs, rubs, or gallops.  ABDOMEN: Soft, nontender, nondistended. Bowel sounds present. No organomegaly or mass.  EXTREMITIES: No pedal edema, cyanosis, or clubbing.  NEUROLOGIC: Cranial nerves II through XII are intact. Muscle strength 5/5 in all extremities. Sensation intact. Gait not checked.  PSYCHIATRIC: The patient is alert and oriented x 3.  SKIN: No obvious rash, lesion, or ulcer.   LABORATORY PANEL:   CBC  Recent Labs Lab 06/10/16 1446  WBC 9.6  HGB 14.0  HCT 41.5  PLT 239   ------------------------------------------------------------------------------------------------------------------  Chemistries   Recent Labs Lab 06/10/16 1446  NA 138  K 3.6  CL 105  CO2 26  GLUCOSE 95  BUN 16  CREATININE 0.86  CALCIUM 9.6    ------------------------------------------------------------------------------------------------------------------  Cardiac Enzymes No results for input(s): TROPONINI in the last 168 hours. ------------------------------------------------------------------------------------------------------------------  RADIOLOGY:  Dg Abdomen 1 View  Result Date: 06/15/2016 CLINICAL DATA:  Left lower pelvic pain, worse for the past 2 days. Recently diagnosed 3 mm left ureteral calculus. Previous appendectomy and cholecystectomy. EXAM: ABDOMEN - 1 VIEW COMPARISON:  Urinary tract ultrasound dated 06/10/2016 and abdomen and pelvis CT dated 06/07/2016. FINDINGS: The previously demonstrated 3 mm mid left ureteral calculus is currently in the inferior left pelvis that the left ureterovesical junction. This measures 5 mm in maximum diameter radiographically. Small bilateral renal calculi are again demonstrated as well as bilateral pelvic phleboliths. Normal bowel gas pattern. Unremarkable bones. Post cholecystectomy and appendectomy changes. IMPRESSION: 1. 5 mm distal left ureteral calculus at the ureterovesical junction. 2. Small bilateral renal calculi. Electronically Signed   By: Beckie Salts M.D.   On: 06/15/2016 17:24      IMPRESSION AND PLAN:   Nephrolithiasis. Observation. Pain control, zofran iv prn. IVF and flomax.  Asthma. Stable.  GERD. protonix.  All the records are reviewed and case discussed with ED provider. Management plans discussed with the patient, family and they are in agreement.  CODE STATUS: full code.  TOTAL TIME TAKING CARE OF THIS PATIENT: 42 minutes.    Shaune Pollack M.D on 06/15/2016 at 8:52 PM  Between 7am to 6pm - Pager - [phone]  After 6pm go to [email or web address] - password [email or web address] [facility]  [employer] [place] [facility]  Office  [phone]  CC: Primary care physician; [name], PA-C   Note: This dictation was prepared with Dragon dictation along with  smaller phrase technology. Any transcriptional errors that result from this process are unintentional.

## 2016-06-15 NOTE — ED Provider Notes (Signed)
[facility] Emergency Department Provider Note  ____________________________________________  Time seen: Approximately 5:14 PM  I have reviewed the triage vital signs and the nursing notes.   HISTORY  Chief Complaint Groin Pain    HPI [name] is a 36 y.o. female who complains of left lower quadrant abdominal pain for the past week, worsened since yesterday. Patient was seen in the emergency department 5 days ago, diagnosed with left ureterolithiasis. Symptoms were controlled at that time. She followed up with urology and plans to have ureteroscopy in 3 days to relieve still inverted in the left ureter and renal system. She has a known 3 mm stone in the left ureter. She feels like in the last couple of days that pain has moved from her mid left abdomen down to her left lower quadrant and closer to the pelvis. Also feels like she has urinary urgency and frequency and he is feeling like she needs to urinate without actually having to void. Denies fever or chills. No vomiting but she has nausea. Pain is worsened since yesterday. She called her urologist clinic who advised her to come to the emergency department for evaluation.  Pain is severe, constant and waxing and waning. No aggravating or alleviating factors. Sharp. Similar to her renal colic pain over the last week, but more intense.   Past Medical History:  Diagnosis Date  . Acute pyelonephritis 06/29/2012  . Anxiety   . Asthma    AS A CHILD  . Complication of anesthesia   . Gallstones   . GERD (gastroesophageal reflux disease)   . History of kidney stones   . Kidney stones    bilateral  . Migraine   . Pelvic pain in female   . PONV (postoperative nausea and vomiting)    WITH HYSTERECTOMY ONLY  . Pyelonephritis 2015  . Renal mass    right  . Tobacco abuse      Patient Active Problem List   Diagnosis Date Noted  . IBS (irritable bowel syndrome) 02/02/2015  . Herpes simplex 02/02/2015   . Asthma, mild intermittent 11/03/2014  . Migraine without aura or status migrainosus 11/03/2014  . Gastroesophageal reflux disease without esophagitis 11/03/2014  . Generalized anxiety disorder 11/03/2014  . Other and unspecified ovarian cyst 08/06/2013  . Left lower quadrant pain 07/16/2013  . Appendicolith 07/16/2013  . Chronic constipation 06/30/2012  . Gross hematuria 06/30/2012  . History of pyelonephritis 06/29/2012  . Nephrolithiasis 06/28/2012     Past Surgical History:  Procedure Laterality Date  . ABDOMINAL HYSTERECTOMY  2006  . ANKLE ARTHROSCOPY Left   . ANKLE ARTHROSCOPY Left 11/24/2015  . APPENDECTOMY  07/26/13  . CAST APPLICATION  12/25/2011   Procedure: CAST APPLICATION;  Surgeon: Javier Docker, MD;  Location: [facility];  Service: Orthopedics;  Laterality: Left;  . CHOLECYSTECTOMY  03/11/2011   Procedure: LAPAROSCOPIC CHOLECYSTECTOMY WITH INTRAOPERATIVE CHOLANGIOGRAM;  Surgeon: Atilano Ina, MD;  Location: [facility] OR;  Service: General;  Laterality: N/A;  . CHOLECYSTECTOMY, LAPAROSCOPIC    . LITHOTRIPSY     x 4  . ORIF ANKLE FRACTURE  12/25/2011   Procedure: OPEN REDUCTION INTERNAL FIXATION (ORIF) ANKLE FRACTURE;  Surgeon: Javier Docker, MD;  Location: [facility];  Service: Orthopedics;  Laterality: Left;  . TONSILLECTOMY       Prior to Admission medications   Medication Sig Start Date End Date Taking? Authorizing Provider  ALPRAZolam (XANAX) 1 MG tablet TAKE 1 TABLET BY MOUTH EVERY DAY AT BEDTIME AND  1/2 TABLET DAILY AS NEEDED Patient taking differently: TAKE 1 TABLET BY MOUTH EVERY DAY AT BEDTIME 04/11/16  Yes Alessandra Bevels Burnette, PA-C  baclofen (LIORESAL) 10 MG tablet Take 10 mg by mouth 2 (two) times daily as needed.  05/15/16  Yes Historical Provider, MD  chlorproMAZINE (THORAZINE) 25 MG tablet Take 25-50 mg by mouth every 4 (four) hours as needed (MIGRAINES).  05/15/16  Yes Historical Provider, MD  HYDROcodone-acetaminophen (NORCO) 5-325 MG tablet Take 1 tablet by  mouth every 6 (six) hours as needed for moderate pain. 06/14/16  Yes Vanna Scotland, MD  Multiple Vitamin (MULTIVITAMIN WITH MINERALS) TABS tablet Take 1 tablet by mouth daily.   Yes Historical Provider, MD  omeprazole (PRILOSEC) 20 MG capsule Take 1 capsule (20 mg total) by mouth daily. Patient taking differently: Take 20 mg by mouth at bedtime.  05/17/16  Yes Alessandra Bevels Burnette, PA-C  promethazine (PHENERGAN) 25 MG tablet TAKE 1 TABLET (25 MG TOTAL) BY MOUTH EVERY 6 (SIX) HOURS AS NEEDED FOR NAUSEA OR VOMITING. 04/11/16  Yes Alessandra Bevels Burnette, PA-C  valACYclovir (VALTREX) 500 MG tablet Take 1-2 tablets (500-1,000 mg total) by mouth 2 (two) times daily as needed. Patient taking differently: Take 1,000 mg by mouth 2 (two) times daily as needed (cold sores).  02/02/15  Yes Alba Cory, MD  zonisamide (ZONEGRAN) 100 MG capsule Take 200 mg by mouth at bedtime. 03/08/16  Yes Historical Provider, MD  ibuprofen (ADVIL,MOTRIN) 800 MG tablet Take 1 tablet (800 mg total) by mouth every 8 (eight) hours as needed for moderate pain. Patient not taking: Reported on 06/12/2016 06/07/16   [facility] Ward, PA-C  ondansetron (ZOFRAN ODT) 4 MG disintegrating tablet Take 1 tablet (4 mg total) by mouth every 8 (eight) hours as needed for nausea or vomiting. Patient not taking: Reported on 06/12/2016 06/07/16   [facility] Ward, PA-C  phenazopyridine (PYRIDIUM) 200 MG tablet Take 1 tablet (200 mg total) by mouth 3 (three) times daily as needed for pain. Patient not taking: Reported on 06/12/2016 06/10/16 06/10/17  Phineas Semen, MD  tamsulosin (FLOMAX) 0.4 MG CAPS capsule Take 1 capsule (0.4 mg total) by mouth daily. Patient not taking: Reported on 06/12/2016 06/10/16   Phineas Semen, MD     Allergies Compazine and Metoclopramide hcl   Family History  Problem Relation Age of Onset  . Diabetes Mother   . Diabetes Father   . Heart failure Father   . Hypertension Father   . Pulmonary fibrosis Father   .  Nephrolithiasis Father     Social History Social History  Substance Use Topics  . Smoking status: Former Smoker    Packs/day: 0.50    Years: 15.00    Types: Cigarettes    Start date: 11/03/1999    Quit date: 09/26/2014  . Smokeless tobacco: Never Used  . Alcohol use 0.0 oz/week     Comment: social-rare    Review of Systems  Constitutional:   No fever or chills.  ENT:   No sore throat. No rhinorrhea. Cardiovascular:   No chest pain. Respiratory:   No dyspnea or cough. Gastrointestinal:   Positive as above for abdominal pain without vomiting and diarrhea.  Genitourinary:   Positive urinary urgency and frequency. Musculoskeletal:   Negative for focal pain or swelling Neurological:   Negative for headaches 10-point ROS otherwise negative.  ____________________________________________   PHYSICAL EXAM:  VITAL SIGNS: ED Triage Vitals [06/15/16 1545]  Enc Vitals Group     BP (!) 149/94  Pulse Rate (!) 103     Resp 20     Temp 98 F (36.7 C)     Temp Source Oral     SpO2 100 %     Weight 180 lb (81.6 kg)     Height  (1.702 m)     Head Circumference      Peak Flow      Pain Score 10     Pain Loc      Pain Edu?      Excl. in GC?     Vital signs reviewed, nursing assessments reviewed.   Constitutional:   Alert and oriented. Uncomfortable but not in distress. Eyes:   No scleral icterus. No conjunctival pallor. PERRL. EOMI.  No nystagmus. ENT   Head:   Normocephalic and atraumatic.   Nose:   No congestion/rhinnorhea. No septal hematoma   Mouth/Throat:   MMM, no pharyngeal erythema. No peritonsillar mass.    Neck:   No stridor. No SubQ emphysema. No meningismus. Hematological/Lymphatic/Immunilogical:   No cervical lymphadenopathy. Cardiovascular:   RRR. Symmetric bilateral radial and DP pulses.  No murmurs.  Respiratory:   Normal respiratory effort without tachypnea nor retractions. Breath sounds are clear and equal bilaterally. No  wheezes/rales/rhonchi. Gastrointestinal:   Soft left lower quadrant tenderness. No suprapubic tenderness. Non distended. There is no CVA tenderness.  No rebound, rigidity, or guarding. Genitourinary:   deferred Musculoskeletal:   Normal range of motion in all extremities. No joint effusions.  No lower extremity tenderness.  No edema. Neurologic:   Normal speech and language.  CN 2-10 normal. Motor grossly intact. No gross focal neurologic deficits are appreciated.  Skin:    Skin is warm, dry and intact. No rash noted.  No petechiae, purpura, or bullae.  ____________________________________________    LABS (pertinent positives/negatives) (all labs ordered are listed, but only abnormal results are displayed) Labs Reviewed  URINALYSIS, COMPLETE (UACMP) WITH MICROSCOPIC - Abnormal; Notable for the following:       Result Value   Color, Urine YELLOW (*)    APPearance HAZY (*)    Hgb urine dipstick MODERATE (*)    Squamous Epithelial / LPF 0-5 (*)    All other components within normal limits  URINE CULTURE  BASIC METABOLIC PANEL  CBC WITH DIFFERENTIAL/PLATELET   ____________________________________________   EKG    ____________________________________________    RADIOLOGY  Dg Abdomen 1 View  Result Date: 06/15/2016 CLINICAL DATA:  Left lower pelvic pain, worse for the past 2 days. Recently diagnosed 3 mm left ureteral calculus. Previous appendectomy and cholecystectomy. EXAM: ABDOMEN - 1 VIEW COMPARISON:  Urinary tract ultrasound dated 06/10/2016 and abdomen and pelvis CT dated 06/07/2016. FINDINGS: The previously demonstrated 3 mm mid left ureteral calculus is currently in the inferior left pelvis that the left ureterovesical junction. This measures 5 mm in maximum diameter radiographically. Small bilateral renal calculi are again demonstrated as well as bilateral pelvic phleboliths. Normal bowel gas pattern. Unremarkable bones. Post cholecystectomy and appendectomy changes.  IMPRESSION: 1. 5 mm distal left ureteral calculus at the ureterovesical junction. 2. Small bilateral renal calculi. Electronically Signed   By: Beckie Salts M.D.   On: 06/15/2016 17:24    ____________________________________________   PROCEDURES Procedures  ____________________________________________   INITIAL IMPRESSION / ASSESSMENT AND PLAN / ED COURSE  Pertinent labs & imaging results that were available during my care of the patient were reviewed by me and considered in my medical decision making (see chart for details).  Clinical Course as of Jun 16 2015  Sat Jun 15, 2016  1704 P/w exac. Of renal colic pain. Has uro procedure planned in 3 days for ureteroscopy. Check KUB and urinalysis. Given fentanyl by triage protocol. Add IV Toradol. Reassess pain.  [PS]  1901 D/w Urology. Agrees that without infection, pain control is primary issue. Will give dilaudid, hospitalize for pain control if needed otherwise plan to continue outpt management.  [PS]  2012 Still severe pain. Will redose pain meds. Will need to hospitalize for intractable pain.   [PS]    Clinical Course User Index [PS] Sharman Cheek, MD     ____________________________________________   FINAL CLINICAL IMPRESSION(S) / ED DIAGNOSES  Final diagnoses:  Ureteral colic  Intractable pain      New Prescriptions   No medications on file     Portions of this note were generated with dragon dictation software. Dictation errors may occur despite best attempts at proofreading.    Sharman Cheek, MD 06/15/16 2018

## 2016-06-16 DIAGNOSIS — J45909 Unspecified asthma, uncomplicated: Secondary | ICD-10-CM | POA: Diagnosis not present

## 2016-06-16 DIAGNOSIS — K219 Gastro-esophageal reflux disease without esophagitis: Secondary | ICD-10-CM | POA: Diagnosis not present

## 2016-06-16 DIAGNOSIS — N200 Calculus of kidney: Secondary | ICD-10-CM | POA: Diagnosis not present

## 2016-06-16 LAB — BASIC METABOLIC PANEL
Anion gap: 4 — ABNORMAL LOW (ref 5–15)
BUN: 10 mg/dL (ref 6–20)
CO2: 27 mmol/L (ref 22–32)
Calcium: 8.5 mg/dL — ABNORMAL LOW (ref 8.9–10.3)
Chloride: 108 mmol/L (ref 101–111)
Creatinine, Ser: 0.62 mg/dL (ref 0.44–1.00)
GFR calc Af Amer: >60 mL/min (ref >60)
GFR calc non Af Amer: >60 mL/min (ref >60)
Glucose, Bld: 100 mg/dL — ABNORMAL HIGH (ref 65–99)
Potassium: 3.7 mmol/L (ref 3.5–5.1)
Sodium: 139 mmol/L (ref 135–145)

## 2016-06-16 LAB — CBC
HCT: 38.2 % (ref 35.0–47.0)
Hemoglobin: 12.7 g/dL (ref 12.0–16.0)
MCH: 31.2 pg (ref 26.0–34.0)
MCHC: 33.3 g/dL (ref 32.0–36.0)
MCV: 93.5 fL (ref 80.0–100.0)
Platelets: 175 10*3/uL (ref 150–440)
RBC: 4.09 MIL/uL (ref 3.80–5.20)
RDW: 13.8 % (ref 11.5–14.5)
WBC: 6.4 10*3/uL (ref 3.6–11.0)

## 2016-06-16 MED ORDER — PROMETHAZINE HCL 25 MG/ML IJ SOLN
12.5000 mg | Freq: Four times a day (QID) | INTRAMUSCULAR | Status: DC | PRN
  Administered 2016-06-16: 12.5 mg via INTRAVENOUS
  Filled 2016-06-16: qty 1

## 2016-06-16 MED ORDER — HYDROMORPHONE HCL 1 MG/ML IJ SOLN
2.0000 mg | Freq: Once | INTRAMUSCULAR | Status: AC
  Administered 2016-06-16: 2 mg via INTRAVENOUS
  Filled 2016-06-16: qty 2

## 2016-06-16 MED ORDER — ONDANSETRON HCL 4 MG/2ML IJ SOLN
4.0000 mg | INTRAMUSCULAR | Status: DC | PRN
  Filled 2016-06-16: qty 2

## 2016-06-16 NOTE — Progress Notes (Signed)
Patient complaining of feeling very nauseated.  Last dose of Zofran was administered at 1738 and is currently ordered every 6 hours as needed. Dr. Imogene Burn (on call for hospitalist) was paged and an order was given to change the frequency of Zofran to every 4 hours as needed.  Permission was also given to give a dose now.

## 2016-06-16 NOTE — Progress Notes (Signed)
[facility] [place] at [facility]   PATIENT NAME: [name]    MR#:  [id number]  DATE OF BIRTH:  1980-07-19  SUBJECTIVE:  CHIEF COMPLAINT:   Chief Complaint  Patient presents with  . Groin Pain   -Still complains of significant left lower quadrant abdominal pain. Nausea and vomiting have improved some since last night. -Hasn't passed the stone yet  REVIEW OF SYSTEMS:  Review of Systems  Constitutional: Negative for chills, fever and malaise/fatigue.  HENT: Negative for congestion, ear discharge, hearing loss and nosebleeds.   Eyes: Negative for blurred vision and double vision.  Respiratory: Negative for cough, shortness of breath and wheezing.   Cardiovascular: Negative for chest pain and palpitations.  Gastrointestinal: Positive for abdominal pain and nausea. Negative for constipation, diarrhea and vomiting.  Genitourinary: Negative for dysuria.  Musculoskeletal: Negative for myalgias.  Neurological: Negative for dizziness, sensory change, speech change, focal weakness, seizures and headaches.    DRUG ALLERGIES:   Allergies  Allergen Reactions  . Compazine Other (See Comments)    Dystonia  . Metoclopramide Hcl Other (See Comments)    dystonia    VITALS:  Blood pressure 101/60, pulse 70, temperature 97.9 F (36.6 C), temperature source Oral, resp. rate 18, height  (1.702 m), weight 81.6 kg (180 lb), SpO2 100 %.  PHYSICAL EXAMINATION:  Physical Exam  GENERAL:  36 y.o.-year-old patient lying in the bed with no acute distress.  EYES: Pupils equal, round, reactive to light and accommodation. No scleral icterus. Extraocular muscles intact.  HEENT: Head atraumatic, normocephalic. Oropharynx and nasopharynx clear.  NECK:  Supple, no jugular venous distention. No thyroid enlargement, no tenderness.  LUNGS: Normal breath sounds bilaterally, no wheezing, rales,rhonchi or crepitation. No use of accessory muscles of respiration.    CARDIOVASCULAR: S1, S2 normal. No murmurs, rubs, or gallops.  ABDOMEN: Soft, Minimal tenderness noted in the left lower quadrant, no rigidity or guarding, nondistended. Bowel sounds present. No organomegaly or mass.  EXTREMITIES: No pedal edema, cyanosis, or clubbing.  NEUROLOGIC: Cranial nerves II through XII are intact. Muscle strength 5/5 in all extremities. Sensation intact. Gait not checked.  PSYCHIATRIC: The patient is alert and oriented x 3.  SKIN: No obvious rash, lesion, or ulcer.    LABORATORY PANEL:   CBC  Recent Labs Lab 06/16/16 0426  WBC 6.4  HGB 12.7  HCT 38.2  PLT 175   ------------------------------------------------------------------------------------------------------------------  Chemistries   Recent Labs Lab 06/16/16 0426  NA 139  K 3.7  CL 108  CO2 27  GLUCOSE 100*  BUN 10  CREATININE 0.62  CALCIUM 8.5*   ------------------------------------------------------------------------------------------------------------------  Cardiac Enzymes No results for input(s): TROPONINI in the last 168 hours. ------------------------------------------------------------------------------------------------------------------  RADIOLOGY:  Dg Abdomen 1 View  Result Date: 06/15/2016 CLINICAL DATA:  Left lower pelvic pain, worse for the past 2 days. Recently diagnosed 3 mm left ureteral calculus. Previous appendectomy and cholecystectomy. EXAM: ABDOMEN - 1 VIEW COMPARISON:  Urinary tract ultrasound dated 06/10/2016 and abdomen and pelvis CT dated 06/07/2016. FINDINGS: The previously demonstrated 3 mm mid left ureteral calculus is currently in the inferior left pelvis that the left ureterovesical junction. This measures 5 mm in maximum diameter radiographically. Small bilateral renal calculi are again demonstrated as well as bilateral pelvic phleboliths. Normal bowel gas pattern. Unremarkable bones. Post cholecystectomy and appendectomy changes. IMPRESSION: 1. 5 mm distal  left ureteral calculus at the ureterovesical junction. 2. Small bilateral renal calculi. Electronically Signed   By: Zada Finders.D.  On: 06/15/2016 17:24    EKG:   Orders placed or performed in visit on 07/01/12  . EKG 12-Lead    ASSESSMENT AND PLAN:   36 year old female with past medical history significant for asthma, reflux disease, history of kidney stones presents to hospital secondary to left lower quadrant abdominal pain.  #1 acute renal colic-known bilateral ureteral and nephrolithiasis. Recent ultrasound and CT confirming this same. -KUB in emergency room showing a 5 mm stone at the left ureter- vesical junction. -Continue symptomatic treatment with fluids, pain medications and anti-emetics. -No fevers or renal failure. So we'll hold off on urology consult for today. -Check KUB in a.m. if she does not pass the stone yet -Otherwise labs are stable. -Also on Flomax  #2 GERD-Protonix  #3 DVT prophylaxis-on LOVENOX   All the records are reviewed and case discussed with Care Management/Social Workerr. Management plans discussed with the patient, family and they are in agreement.  CODE STATUS: Full code  TOTAL TIME TAKING CARE OF THIS PATIENT: 38 minutes.   POSSIBLE D/C IN 1-2 DAYS, DEPENDING ON CLINICAL CONDITION.   Enid Baas M.D on 06/16/2016 at 12:00 PM  Between 7am to 6pm - Pager - [phone]  After 6pm go to [email or web address] - Social research officer, government  [facility]  Office  [phone]  CC: Primary care physician; [name], PA-C

## 2016-06-16 NOTE — Progress Notes (Signed)
Notified Dr. Anne Hahn about patient still feeling nausea after prn nausea medications administered and  With no relief; Waiting for new order; Will continue to monitor

## 2016-06-17 ENCOUNTER — Telehealth: Payer: Self-pay | Admitting: Radiology

## 2016-06-17 DIAGNOSIS — K219 Gastro-esophageal reflux disease without esophagitis: Secondary | ICD-10-CM | POA: Diagnosis not present

## 2016-06-17 DIAGNOSIS — N200 Calculus of kidney: Secondary | ICD-10-CM | POA: Diagnosis not present

## 2016-06-17 DIAGNOSIS — J45909 Unspecified asthma, uncomplicated: Secondary | ICD-10-CM | POA: Diagnosis not present

## 2016-06-17 LAB — BASIC METABOLIC PANEL
Anion gap: 3 — ABNORMAL LOW (ref 5–15)
BUN: 9 mg/dL (ref 6–20)
CO2: 24 mmol/L (ref 22–32)
Calcium: 8.7 mg/dL — ABNORMAL LOW (ref 8.9–10.3)
Chloride: 112 mmol/L — ABNORMAL HIGH (ref 101–111)
Creatinine, Ser: 0.71 mg/dL (ref 0.44–1.00)
GFR calc Af Amer: >60 mL/min (ref >60)
GFR calc non Af Amer: >60 mL/min (ref >60)
Glucose, Bld: 85 mg/dL (ref 65–99)
Potassium: 3.9 mmol/L (ref 3.5–5.1)
Sodium: 139 mmol/L (ref 135–145)

## 2016-06-17 LAB — HIV ANTIBODY (ROUTINE TESTING W REFLEX): HIV Screen 4th Generation wRfx: NONREACTIVE

## 2016-06-17 LAB — URINE CULTURE: Culture: <10000 — AB

## 2016-06-17 MED ORDER — PROMETHAZINE HCL 25 MG PO TABS: 25.0000 mg | ORAL_TABLET | Freq: Three times a day (TID) | ORAL | 0 refills | Status: DC | PRN

## 2016-06-17 NOTE — Telephone Encounter (Signed)
Spoke with Josh, pt's nurse on [facility] 2-C, advising pt to proceed with surgery tomorrow as planned with arrival time to [facility] of 8:00am, to be npo after mn & have a driver present. Josh voices understanding & will relay message to pt.

## 2016-06-17 NOTE — Progress Notes (Signed)
Pt to be discharged per MD order. IV removed. Instructions reviewed with pt and all questions answered. Scripts sent to [facility], pt to pickup. Instructed pt to arrive around 0800 for her procedure, to maintain NPO status after midnight and to have ride home.

## 2016-06-17 NOTE — Discharge Summary (Signed)
[facility] - [place] at [facility]   PATIENT NAME: [name]    MR#:  [id number]  DATE OF BIRTH:  1980/10/08  DATE OF ADMISSION:  06/15/2016   ADMITTING PHYSICIAN: Shaune Pollack, MD  DATE OF DISCHARGE: 06/17/2016 12:47 PM  PRIMARY CARE PHYSICIAN: [name], PA-C   ADMISSION DIAGNOSIS:   Ureteral colic [N23] Intractable pain [R52]  DISCHARGE DIAGNOSIS:   Active Problems:   Nephrolithiasis   SECONDARY DIAGNOSIS:   Past Medical History:  Diagnosis Date  . Acute pyelonephritis 06/29/2012  . Anxiety   . Asthma    AS A CHILD  . Complication of anesthesia   . Gallstones   . GERD (gastroesophageal reflux disease)   . History of kidney stones   . Kidney stones    bilateral  . Migraine   . Pelvic pain in female   . PONV (postoperative nausea and vomiting)    WITH HYSTERECTOMY ONLY  . Pyelonephritis 2015  . Renal mass    right  . Tobacco abuse     HOSPITAL COURSE:   36 year old female with past medical history significant for asthma, reflux disease, history of kidney stones presents to hospital secondary to left lower quadrant abdominal pain.  #1 acute renal colic-known bilateral ureteral and nephrolithiasis. Recent ultrasound and CT confirming this same. -KUB in emergency room showing a 5 mm stone at the left ureter- vesical junction. -received symptomatic treatment with fluids, pain medications and anti-emetics. -No fevers or renal failure. No infection noted.  -Also on Flomax -Symptoms are resolved at this time. Patient feels normal. Patient has stent procedure scheduled for tomorrow a.m. Discussed with her urologist and she is being discharged today on pain meds and nausea medications as needed.  #2 GERD- prilosec  Being discharged today  DISCHARGE CONDITIONS:   Stable  CONSULTS OBTAINED:   None  DRUG ALLERGIES:   Allergies  Allergen Reactions  . Compazine Other (See Comments)    Dystonia  . Metoclopramide Hcl  Other (See Comments)    dystonia   DISCHARGE MEDICATIONS:   Allergies as of 06/17/2016      Reactions   Compazine Other (See Comments)   Dystonia   Metoclopramide Hcl Other (See Comments)   dystonia      Medication List    STOP taking these medications   chlorproMAZINE 25 MG tablet Commonly known as:  THORAZINE   ibuprofen 800 MG tablet Commonly known as:  ADVIL,MOTRIN   ondansetron 4 MG disintegrating tablet Commonly known as:  ZOFRAN ODT     TAKE these medications   ALPRAZolam 1 MG tablet Commonly known as:  XANAX TAKE 1 TABLET BY MOUTH EVERY DAY AT BEDTIME AND 1/2 TABLET DAILY AS NEEDED What changed:  See the new instructions.   baclofen 10 MG tablet Commonly known as:  LIORESAL Take 10 mg by mouth 2 (two) times daily as needed.   HYDROcodone-acetaminophen 5-325 MG tablet Commonly known as:  NORCO Take 1 tablet by mouth every 6 (six) hours as needed for moderate pain. Notes to patient:  Last dose given on 06/16/16   multivitamin with minerals Tabs tablet Take 1 tablet by mouth daily.   omeprazole 20 MG capsule Commonly known as:  PRILOSEC Take 1 capsule (20 mg total) by mouth daily. What changed:  when to take this   phenazopyridine 200 MG tablet Commonly known as:  PYRIDIUM Take 1 tablet (200 mg total) by mouth 3 (three) times daily as needed for pain.   promethazine  25 MG tablet Commonly known as:  PHENERGAN Take 1 tablet (25 mg total) by mouth every 8 (eight) hours as needed for nausea or vomiting. What changed:  See the new instructions. Notes to patient:  Last dose given on 06/16/16   tamsulosin 0.4 MG Caps capsule Commonly known as:  FLOMAX Take 1 capsule (0.4 mg total) by mouth daily.   valACYclovir 500 MG tablet Commonly known as:  VALTREX Take 1-2 tablets (500-1,000 mg total) by mouth 2 (two) times daily as needed. What changed:  how much to take  reasons to take this   zonisamide 100 MG capsule Commonly known as:  ZONEGRAN Take 200  mg by mouth at bedtime.        DISCHARGE INSTRUCTIONS:   1. Follow up for stent placement tomorrow AM as prior scheduled  DIET:   Regular diet  ACTIVITY:   Activity as tolerated  OXYGEN:   Home Oxygen: No.  Oxygen Delivery: room air  DISCHARGE LOCATION:   home   If you experience worsening of your admission symptoms, develop shortness of breath, life threatening emergency, suicidal or homicidal thoughts you must seek medical attention immediately by calling 911 or calling your MD immediately  if symptoms less severe.  You Must read complete instructions/literature along with all the possible adverse reactions/side effects for all the Medicines you take and that have been prescribed to you. Take any new Medicines after you have completely understood and accpet all the possible adverse reactions/side effects.   Please note  You were cared for by a hospitalist during your hospital stay. If you have any questions about your discharge medications or the care you received while you were in the hospital after you are discharged, you can call the unit and asked to speak with the hospitalist on call if the hospitalist that took care of you is not available. Once you are discharged, your primary care physician will handle any further medical issues. Please note that NO REFILLS for any discharge medications will be authorized once you are discharged, as it is imperative that you return to your primary care physician (or establish a relationship with a primary care physician if you do not have one) for your aftercare needs so that they can reassess your need for medications and monitor your lab values.    On the day of Discharge:  VITAL SIGNS:   Blood pressure 96/63, pulse 75, temperature 97.8 F (36.6 C), temperature source Oral, resp. rate 18, height  (1.702 m), weight 81.6 kg (180 lb), SpO2 100 %.  PHYSICAL EXAMINATION:   GENERAL:  36 y.o.-year-old patient lying in the bed  with no acute distress.  EYES: Pupils equal, round, reactive to light and accommodation. No scleral icterus. Extraocular muscles intact.  HEENT: Head atraumatic, normocephalic. Oropharynx and nasopharynx clear.  NECK:  Supple, no jugular venous distention. No thyroid enlargement, no tenderness.  LUNGS: Normal breath sounds bilaterally, no wheezing, rales,rhonchi or crepitation. No use of accessory muscles of respiration.  CARDIOVASCULAR: S1, S2 normal. No murmurs, rubs, or gallops.  ABDOMEN: Soft, Minimal tenderness noted in the left lower quadrant, no rigidity or guarding, nondistended. Bowel sounds present. No organomegaly or mass.  EXTREMITIES: No pedal edema, cyanosis, or clubbing.  NEUROLOGIC: Cranial nerves II through XII are intact. Muscle strength 5/5 in all extremities. Sensation intact. Gait not checked.  PSYCHIATRIC: The patient is alert and oriented x 3.  SKIN: No obvious rash, lesion, or ulcer.   DATA REVIEW:   CBC  Recent Labs Lab 06/16/16 0426  WBC 6.4  HGB 12.7  HCT 38.2  PLT 175    Chemistries   Recent Labs Lab 06/17/16 0415  NA 139  K 3.9  CL 112*  CO2 24  GLUCOSE 85  BUN 9  CREATININE 0.71  CALCIUM 8.7*     Microbiology Results  Results for orders placed or performed during the hospital encounter of 06/15/16  Urine culture     Status: Abnormal   Collection Time: 06/15/16  5:52 PM  Result Value Ref Range Status   Specimen Description URINE, CLEAN CATCH  Final   Special Requests NONE  Final   Culture (A)  Final    <10,000 COLONIES/mL INSIGNIFICANT GROWTH Performed at [facility], [address] [place], [place] [address]    Report Status 06/17/2016 FINAL  Final    RADIOLOGY:  No results found.   Management plans discussed with the patient, family and they are in agreement.  CODE STATUS:     Code Status Orders        Start     Ordered   06/15/16 2149  Full code  Continuous     06/15/16 2148    Code Status History    Date  Active Date Inactive Code Status Order ID Comments User Context   06/28/2012 10:39 AM 06/30/2012 10:15 PM Full Code [id number]  Erick Blinks, MD Inpatient   03/11/2011 12:30 AM 03/12/2011 12:40 PM Full Code [id number]  Domingo Dimes, RN Inpatient   03/03/2011  6:17 AM 03/03/2011 11:43 AM Full Code [id number]  Juliet Rude. Rubin Payor, MD ED      TOTAL TIME TAKING CARE OF THIS PATIENT: 38 minutes.    Enid Baas M.D on 06/17/2016 at 1:57 PM  Between 7am to 6pm - Pager - [phone]  After 6pm go to [email or web address] - password [email or web address] [facility]  [facility] [place] [facility]  Office  [phone]  CC: Primary care physician; [name], PA-C   Note: This dictation was prepared with Dragon dictation along with smaller phrase technology. Any transcriptional errors that result from this process are unintentional.

## 2016-06-18 ENCOUNTER — Ambulatory Visit: Payer: BLUE CROSS/BLUE SHIELD | Admitting: Anesthesiology

## 2016-06-18 ENCOUNTER — Ambulatory Visit
Admission: RE | Admit: 2016-06-18 | Discharge: 2016-06-18 | Disposition: A | Discharge status: Home / Self Care | Payer: BLUE CROSS/BLUE SHIELD | Source: Ambulatory Visit | Attending: Urology | Admitting: Urology

## 2016-06-18 ENCOUNTER — Encounter
Admission: RE | Disposition: A | Discharge status: Home / Self Care | Payer: Self-pay | Source: Ambulatory Visit | Attending: Urology

## 2016-06-18 ENCOUNTER — Encounter: Payer: Self-pay | Admitting: Anesthesiology

## 2016-06-18 ENCOUNTER — Other Ambulatory Visit: Payer: Self-pay

## 2016-06-18 DIAGNOSIS — J45909 Unspecified asthma, uncomplicated: Secondary | ICD-10-CM | POA: Diagnosis not present

## 2016-06-18 DIAGNOSIS — Z791 Long term (current) use of non-steroidal anti-inflammatories (NSAID): Secondary | ICD-10-CM | POA: Diagnosis not present

## 2016-06-18 DIAGNOSIS — N201 Calculus of ureter: Secondary | ICD-10-CM

## 2016-06-18 DIAGNOSIS — Z87891 Personal history of nicotine dependence: Secondary | ICD-10-CM | POA: Insufficient documentation

## 2016-06-18 DIAGNOSIS — Z79899 Other long term (current) drug therapy: Secondary | ICD-10-CM | POA: Insufficient documentation

## 2016-06-18 DIAGNOSIS — F419 Anxiety disorder, unspecified: Secondary | ICD-10-CM | POA: Insufficient documentation

## 2016-06-18 DIAGNOSIS — N202 Calculus of kidney with calculus of ureter: Secondary | ICD-10-CM | POA: Insufficient documentation

## 2016-06-18 DIAGNOSIS — K219 Gastro-esophageal reflux disease without esophagitis: Secondary | ICD-10-CM | POA: Diagnosis not present

## 2016-06-18 DIAGNOSIS — N200 Calculus of kidney: Secondary | ICD-10-CM

## 2016-06-18 HISTORY — PX: CYSTOSCOPY/URETEROSCOPY/HOLMIUM LASER/STENT PLACEMENT: SHX6546

## 2016-06-18 SURGERY — CYSTOSCOPY/URETEROSCOPY/HOLMIUM LASER/STENT PLACEMENT
Anesthesia: General | Site: Ureter | Laterality: Left | Wound class: Clean Contaminated

## 2016-06-18 MED ORDER — HYDROMORPHONE HCL 1 MG/ML IJ SOLN
INTRAMUSCULAR | Status: AC
  Administered 2016-06-18: 0.5 mg via INTRAVENOUS
  Filled 2016-06-18: qty 1

## 2016-06-18 MED ORDER — LACTATED RINGERS IV SOLN
INTRAVENOUS | Status: DC
  Administered 2016-06-18 (×2): via INTRAVENOUS

## 2016-06-18 MED ORDER — OXYBUTYNIN CHLORIDE 5 MG PO TABS
5.0000 mg | ORAL_TABLET | Freq: Three times a day (TID) | ORAL | Status: AC | PRN
  Administered 2016-06-18: 5 mg via ORAL
  Filled 2016-06-18: qty 1

## 2016-06-18 MED ORDER — CEFAZOLIN SODIUM 1 G IJ SOLR
INTRAMUSCULAR | Status: AC
  Filled 2016-06-18: qty 10

## 2016-06-18 MED ORDER — OXYBUTYNIN CHLORIDE 5 MG PO TABS
ORAL_TABLET | ORAL | Status: AC
  Administered 2016-06-18: 12:00:00 via ORAL
  Filled 2016-06-18: qty 1

## 2016-06-18 MED ORDER — SCOPOLAMINE 1 MG/3DAYS TD PT72
MEDICATED_PATCH | TRANSDERMAL | Status: AC
  Filled 2016-06-18: qty 1

## 2016-06-18 MED ORDER — FENTANYL CITRATE (PF) 100 MCG/2ML IJ SOLN
INTRAMUSCULAR | Status: AC
  Administered 2016-06-18: 50 ug via INTRAVENOUS
  Filled 2016-06-18: qty 2

## 2016-06-18 MED ORDER — HYDROMORPHONE HCL 1 MG/ML IJ SOLN
0.5000 mg | INTRAMUSCULAR | Status: AC | PRN
  Administered 2016-06-18 (×4): 0.5 mg via INTRAVENOUS

## 2016-06-18 MED ORDER — IOTHALAMATE MEGLUMINE 43 % IV SOLN
INTRAVENOUS | Status: DC | PRN
  Administered 2016-06-18: 20 mL

## 2016-06-18 MED ORDER — CEFAZOLIN SODIUM-DEXTROSE 2-4 GM/100ML-% IV SOLN
INTRAVENOUS | Status: AC
  Administered 2016-06-18: 2 g via INTRAVENOUS
  Filled 2016-06-18: qty 100

## 2016-06-18 MED ORDER — LIDOCAINE HCL (CARDIAC) 20 MG/ML IV SOLN
INTRAVENOUS | Status: DC | PRN
  Administered 2016-06-18: 100 mg via INTRAVENOUS

## 2016-06-18 MED ORDER — MIDAZOLAM HCL 2 MG/2ML IJ SOLN
INTRAMUSCULAR | Status: DC | PRN
  Administered 2016-06-18: 2 mg via INTRAVENOUS

## 2016-06-18 MED ORDER — PROPOFOL 10 MG/ML IV BOLUS
INTRAVENOUS | Status: DC | PRN
  Administered 2016-06-18: 160 mg via INTRAVENOUS

## 2016-06-18 MED ORDER — SCOPOLAMINE 1 MG/3DAYS TD PT72
1.0000 | MEDICATED_PATCH | TRANSDERMAL | Status: DC
  Administered 2016-06-18: 1.5 mg via TRANSDERMAL

## 2016-06-18 MED ORDER — DEXAMETHASONE SODIUM PHOSPHATE 10 MG/ML IJ SOLN
INTRAMUSCULAR | Status: DC | PRN
  Administered 2016-06-18: 10 mg via INTRAVENOUS

## 2016-06-18 MED ORDER — FENTANYL CITRATE (PF) 100 MCG/2ML IJ SOLN
50.0000 ug | INTRAMUSCULAR | Status: AC | PRN
  Administered 2016-06-18 (×2): 50 ug via INTRAVENOUS

## 2016-06-18 MED ORDER — HYDROCODONE-ACETAMINOPHEN 5-325 MG PO TABS: 1.0000 | ORAL_TABLET | Freq: Four times a day (QID) | ORAL | 0 refills | Status: DC | PRN

## 2016-06-18 MED ORDER — OXYBUTYNIN CHLORIDE 5 MG PO TABS: 5.0000 mg | ORAL_TABLET | Freq: Three times a day (TID) | ORAL | 0 refills | Status: DC | PRN

## 2016-06-18 MED ORDER — ROCURONIUM BROMIDE 100 MG/10ML IV SOLN
INTRAVENOUS | Status: DC | PRN
  Administered 2016-06-18: 20 mg via INTRAVENOUS
  Administered 2016-06-18 (×2): 10 mg via INTRAVENOUS

## 2016-06-18 MED ORDER — SUGAMMADEX SODIUM 500 MG/5ML IV SOLN
INTRAVENOUS | Status: DC | PRN
  Administered 2016-06-18: 200 mg via INTRAVENOUS

## 2016-06-18 MED ORDER — CEFAZOLIN SODIUM-DEXTROSE 2-4 GM/100ML-% IV SOLN
2.0000 g | INTRAVENOUS | Status: AC
  Administered 2016-06-18: 2 g via INTRAVENOUS

## 2016-06-18 MED ORDER — PROPOFOL 10 MG/ML IV BOLUS
INTRAVENOUS | Status: AC
  Filled 2016-06-18: qty 20

## 2016-06-18 MED ORDER — MIDAZOLAM HCL 2 MG/2ML IJ SOLN
INTRAMUSCULAR | Status: AC
  Filled 2016-06-18: qty 2

## 2016-06-18 MED ORDER — FENTANYL CITRATE (PF) 100 MCG/2ML IJ SOLN
INTRAMUSCULAR | Status: AC
  Filled 2016-06-18: qty 2

## 2016-06-18 MED ORDER — FENTANYL CITRATE (PF) 100 MCG/2ML IJ SOLN
INTRAMUSCULAR | Status: DC | PRN
  Administered 2016-06-18 (×2): 50 ug via INTRAVENOUS

## 2016-06-18 SURGICAL SUPPLY — 29 items
BAG DRAIN CYSTO-URO LG1000N (MISCELLANEOUS) ×2 IMPLANT
BASKET ZERO TIP 1.9FR (BASKET) ×2 IMPLANT
BRUSH SCRUB EZ 1% IODOPHOR (MISCELLANEOUS) ×2 IMPLANT
CATH URETL 5X70 OPEN END (CATHETERS) ×2 IMPLANT
CNTNR SPEC 2.5X3XGRAD LEK (MISCELLANEOUS) ×1
CONRAY 43 FOR UROLOGY 50M (MISCELLANEOUS) ×2 IMPLANT
CONT SPEC 4OZ STER OR WHT (MISCELLANEOUS) ×1
CONTAINER SPEC 2.5X3XGRAD LEK (MISCELLANEOUS) ×1 IMPLANT
DRAPE UTILITY 15X26 TOWEL STRL (DRAPES) ×2 IMPLANT
FIBER LASER LITHO 273 (Laser) ×2 IMPLANT
GLOVE BIO SURGEON STRL SZ 6.5 (GLOVE) ×2 IMPLANT
GOWN STRL REUS W/ TWL LRG LVL3 (GOWN DISPOSABLE) ×2 IMPLANT
GOWN STRL REUS W/TWL LRG LVL3 (GOWN DISPOSABLE) ×2
GUIDEWIRE GREEN .038 145CM (MISCELLANEOUS) ×2 IMPLANT
GUIDEWIRE SUPER STIFF (WIRE) IMPLANT
INFUSOR MANOMETER BAG 3000ML (MISCELLANEOUS) ×2 IMPLANT
INTRODUCER DILATOR DOUBLE (INTRODUCER) ×2 IMPLANT
KIT RM TURNOVER CYSTO AR (KITS) ×2 IMPLANT
PACK CYSTO AR (MISCELLANEOUS) ×2 IMPLANT
SENSORWIRE 0.038 NOT ANGLED (WIRE) ×2
SET CYSTO W/LG BORE CLAMP LF (SET/KITS/TRAYS/PACK) ×2 IMPLANT
SHEATH URETERAL 12FRX35CM (MISCELLANEOUS) IMPLANT
SOL .9 NS 3000ML IRR  AL (IV SOLUTION) ×1
SOL .9 NS 3000ML IRR UROMATIC (IV SOLUTION) ×1 IMPLANT
STENT URET 6FRX24 CONTOUR (STENTS) ×2 IMPLANT
STENT URET 6FRX26 CONTOUR (STENTS) IMPLANT
SURGILUBE 2OZ TUBE FLIPTOP (MISCELLANEOUS) ×2 IMPLANT
WATER STERILE IRR 1000ML POUR (IV SOLUTION) ×2 IMPLANT
WIRE SENSOR 0.038 NOT ANGLED (WIRE) ×1 IMPLANT

## 2016-06-18 NOTE — Anesthesia Preprocedure Evaluation (Signed)
Anesthesia Evaluation  Patient identified by MRN, date of birth, ID band Patient awake    Reviewed: Allergy & Precautions, H&P , NPO status , Patient's Chart, lab work & pertinent test results  History of Anesthesia Complications (+) PONV and history of anesthetic complications  Airway Mallampati: III  TM Distance: >3 FB Neck ROM: full    Dental  (+) Poor Dentition, Caps, Chipped   Pulmonary neg shortness of breath, asthma , former smoker,    Pulmonary exam normal breath sounds clear to auscultation       Cardiovascular Exercise Tolerance: Good (-) angina(-) DOE negative cardio ROS Normal cardiovascular exam Rhythm:regular Rate:Normal     Neuro/Psych  Headaches, PSYCHIATRIC DISORDERS Anxiety    GI/Hepatic Neg liver ROS, GERD  Medicated and Controlled,  Endo/Other  negative endocrine ROS  Renal/GU Renal disease     Musculoskeletal   Abdominal   Peds  Hematology negative hematology ROS (+)   Anesthesia Other Findings Past Medical History: 06/29/2012: Acute pyelonephritis No date: Anxiety No date: Asthma     Comment: AS A CHILD No date: Complication of anesthesia No date: Gallstones No date: GERD (gastroesophageal reflux disease) No date: History of kidney stones No date: Kidney stones     Comment: bilateral No date: Migraine No date: Pelvic pain in female No date: PONV (postoperative nausea and vomiting)     Comment: WITH HYSTERECTOMY ONLY 2015: Pyelonephritis No date: Renal mass     Comment: right No date: Tobacco abuse  Past Surgical History: 2006: ABDOMINAL HYSTERECTOMY No date: ANKLE ARTHROSCOPY Left 11/24/2015: ANKLE ARTHROSCOPY Left 07/26/13: APPENDECTOMY 12/25/2011: CAST APPLICATION     Comment: Procedure: CAST APPLICATION;  Surgeon: Javier Docker, MD;  Location: [facility]S;  Service:               Orthopedics;  Laterality: Left; 03/11/2011: CHOLECYSTECTOMY     Comment: Procedure:  LAPAROSCOPIC CHOLECYSTECTOMY WITH               INTRAOPERATIVE CHOLANGIOGRAM;  Surgeon: Atilano Ina, MD;  Location: [facility] OR;  Service:               General;  Laterality: N/A; No date: CHOLECYSTECTOMY, LAPAROSCOPIC No date: LITHOTRIPSY     Comment: x 4 12/25/2011: ORIF ANKLE FRACTURE     Comment: Procedure: OPEN REDUCTION INTERNAL FIXATION               (ORIF) ANKLE FRACTURE;  Surgeon: Javier Docker, MD;  Location: [facility];  Service:               Orthopedics;  Laterality: Left; No date: TONSILLECTOMY     Reproductive/Obstetrics negative OB ROS                             Anesthesia Physical Anesthesia Plan  ASA: III  Anesthesia Plan: General ETT   Post-op Pain Management:    Induction: Intravenous  Airway Management Planned: Oral ETT  Additional Equipment:   Intra-op Plan:   Post-operative Plan: Extubation in OR  Informed Consent: I have reviewed the patients History and Physical, chart, labs and discussed the procedure including the risks, benefits and alternatives for the proposed anesthesia with the patient or  authorized representative who has indicated his/her understanding and acceptance.   Dental Advisory Given  Plan Discussed with: Anesthesiologist, CRNA and Surgeon  Anesthesia Plan Comments: (Patient consented for risks of anesthesia including but not limited to:  - adverse reactions to medications - damage to teeth, lips or other oral mucosa - sore throat or hoarseness - Damage to heart, brain, lungs or loss of life  Patient voiced understanding.)        Anesthesia Quick Evaluation

## 2016-06-18 NOTE — Transfer of Care (Signed)
Immediate Anesthesia Transfer of Care Note  Patient: [name]  Procedure(s) Performed: Procedure(s): CYSTOSCOPY/URETEROSCOPY/HOLMIUM LASER/STENT PLACEMENT (Left)  Patient Location: PACU  Anesthesia Type:General  Level of Consciousness: awake, alert  and oriented  Airway & Oxygen Therapy: Patient Spontanous Breathing and Patient connected to face mask oxygen  Post-op Assessment: Report given to RN and Post -op Vital signs reviewed and stable  Post vital signs: Reviewed and stable  Last Vitals:  Vitals:   06/18/16 0842 06/18/16 1025  BP: 102/68 112/83  Pulse:  87  Resp: 18 14  Temp: 36.8 C 36.4 C    Last Pain:  Vitals:   06/18/16 0842  TempSrc: Oral  PainSc: 6          Complications: No apparent anesthesia complications

## 2016-06-18 NOTE — H&P (View-Only) (Signed)
 06/11/2016 7:40 AM   [name] 04/10/1980 [id number]  Referring provider: Jennifer M Burnette, PA-C [address] [place], [place] [address]  Chief Complaint  Patient presents with  . Nephrolithiasis    ER follow up    HPI: 36-year-old female with a history of kidney stones who returns today after several recent emergency room visits, first on 06/07/2016 and 1 again yesterday. CT abdomen and pelvis on 06/07/2016 shows a left mid ureteral calculus measuring 3 mm WITHOUT hydronephrosis. She is also noted to have bilateral nonobstructing calculi measuring up to 5 mm bilaterally.  Follow up renal ultrasound yesterday shows bilateral nonobstructing calculi without hydronephrosis.  She continues to have left flank pain which comes and goes.  She is been taking narcotics off and on since her initial emergency room visit.  She was previously followed by Lindsey Overton, NP in our clinic ~ 2 years ago.  She has had a previous stone analysis and 24-hour urine but the records are not available to me today.  She was started on potassium citrate for kidney stone prevention but not able to tolerate this medication due to stomach upset.  She takes a medication every other day as possible.  She has had ESWL x 4 and ureteroscopy/ stent in the past.  Her most recent procedure ~ 2years ago or more.    Her pain today is much milder but present.  No nausea or vominting.  No fevers, chills, dysuria, or gross hematuria.     PMH: Past Medical History:  Diagnosis Date  . Acute pyelonephritis 06/29/2012  . Anxiety   . Asthma   . Gallstones   . Kidney stones    bilateral  . Migraine   . Pelvic pain in female   . Pyelonephritis 2015  . Renal mass    right  . Tobacco abuse     Surgical History: Past Surgical History:  Procedure Laterality Date  . ABDOMINAL HYSTERECTOMY  2006  . ANKLE ARTHROSCOPY Left   . APPENDECTOMY  07/26/13  . CAST APPLICATION  12/25/2011   Procedure:  CAST APPLICATION;  Surgeon: Jeffrey C Beane, MD;  Location: [facility];  Service: Orthopedics;  Laterality: Left;  . CHOLECYSTECTOMY  03/11/2011   Procedure: LAPAROSCOPIC CHOLECYSTECTOMY WITH INTRAOPERATIVE CHOLANGIOGRAM;  Surgeon: Eric M Wilson, MD;  Location: [facility];  Service: General;  Laterality: N/A;  . CHOLECYSTECTOMY, LAPAROSCOPIC    . LITHOTRIPSY     x 4  . ORIF ANKLE FRACTURE  12/25/2011   Procedure: OPEN REDUCTION INTERNAL FIXATION (ORIF) ANKLE FRACTURE;  Surgeon: Jeffrey C Beane, MD;  Location: [facility];  Service: Orthopedics;  Laterality: Left;  . TONSILLECTOMY      Home Medications:  Allergies as of 06/11/2016      Reactions   Compazine Other (See Comments)   Dystonia   Metoclopramide Hcl Other (See Comments)   dystonia      Medication List       Accurate as of 06/11/16 11:59 PM. Always use your most recent med list.          ALPRAZolam 1 MG tablet Commonly known as:  XANAX TAKE 1 TABLET BY MOUTH EVERY DAY AT BEDTIME AND 1/2 TABLET DAILY AS NEEDED   baclofen 10 MG tablet Commonly known as:  LIORESAL Take 5-10 mg by mouth 3 (three) times daily as needed for migraine.   chlorproMAZINE 25 MG tablet Commonly known as:  THORAZINE Take 25-50 mg by mouth every 4 (four) hours as needed for   migraine.   cyclobenzaprine 10 MG tablet Commonly known as:  FLEXERIL Take 10 mg by mouth 3 (three) times daily as needed for spasms.   HYDROcodone-acetaminophen 5-325 MG tablet Commonly known as:  NORCO Take 1 tablet by mouth every 6 (six) hours as needed for moderate pain.   ibuprofen 800 MG tablet Commonly known as:  ADVIL,MOTRIN Take 1 tablet (800 mg total) by mouth every 8 (eight) hours as needed for moderate pain.   multivitamin with minerals Tabs tablet Take 1 tablet by mouth daily.   omeprazole 20 MG capsule Commonly known as:  PRILOSEC Take 1 capsule (20 mg total) by mouth daily.   ondansetron 4 MG disintegrating tablet Commonly known as:  ZOFRAN ODT Take 1 tablet (4  mg total) by mouth every 8 (eight) hours as needed for nausea or vomiting.   phenazopyridine 200 MG tablet Commonly known as:  PYRIDIUM Take 1 tablet (200 mg total) by mouth 3 (three) times daily as needed for pain.   promethazine 25 MG tablet Commonly known as:  PHENERGAN TAKE 1 TABLET (25 MG TOTAL) BY MOUTH EVERY 6 (SIX) HOURS AS NEEDED FOR NAUSEA OR VOMITING.   tamsulosin 0.4 MG Caps capsule Commonly known as:  FLOMAX Take 1 capsule (0.4 mg total) by mouth daily.   valACYclovir 500 MG tablet Commonly known as:  VALTREX Take 1-2 tablets (500-1,000 mg total) by mouth 2 (two) times daily as needed.   zonisamide 100 MG capsule Commonly known as:  ZONEGRAN Take 200 mg by mouth at bedtime.       Allergies:  Allergies  Allergen Reactions  . Compazine Other (See Comments)    Dystonia  . Metoclopramide Hcl Other (See Comments)    dystonia    Family History: Family History  Problem Relation Age of Onset  . Diabetes Mother   . Diabetes Father   . Heart failure Father   . Hypertension Father   . Pulmonary fibrosis Father   . Nephrolithiasis Father     Social History:  reports that she quit smoking about 20 months ago. Her smoking use included Cigarettes. She started smoking about 16 years ago. She has a 7.50 pack-year smoking history. She has never used smokeless tobacco. She reports that she drinks alcohol. She reports that she does not use drugs.  ROS: UROLOGY Frequent Urination?: Yes Hard to postpone urination?: No Burning/pain with urination?: Yes Get up at night to urinate?: No Leakage of urine?: No Urine stream starts and stops?: No Trouble starting stream?: No Do you have to strain to urinate?: No Blood in urine?: Yes Urinary tract infection?: No Sexually transmitted disease?: No Injury to kidneys or bladder?: No Painful intercourse?: No Weak stream?: No Currently pregnant?: No Vaginal bleeding?: No Last menstrual period?: n  Gastrointestinal Nausea?:  No Vomiting?: No Indigestion/heartburn?: No Diarrhea?: No Constipation?: No  Constitutional Fever: No Night sweats?: No Weight loss?: No Fatigue?: No  Skin Skin rash/lesions?: No Itching?: No  Eyes Blurred vision?: No Double vision?: No  Ears/Nose/Throat Sore throat?: No Sinus problems?: No  Hematologic/Lymphatic Swollen glands?: No Easy bruising?: No  Cardiovascular Leg swelling?: No Chest pain?: No  Respiratory Cough?: No Shortness of breath?: No  Endocrine Excessive thirst?: No  Musculoskeletal Back pain?: No Joint pain?: No  Neurological Headaches?: No Dizziness?: No  Psychologic Depression?: No Anxiety?: No  Physical Exam: BP 122/83   Pulse 80   Ht 5' 7" (1.702 m)   Wt 180 lb (81.6 kg)   BMI 28.19 kg/m     Constitutional:  Alert and oriented, No acute distress. HEENT:  AT, moist mucus membranes.  Trachea midline, no masses. Cardiovascular: No clubbing, cyanosis, or edema. Respiratory: Normal respiratory effort, no increased work of breathing. GI: Abdomen is soft, nontender, nondistended, no abdominal masses GU: Mild left CVA tenderness. Skin: No rashes, bruises or suspicious lesions. Neurologic: Grossly intact, no focal deficits, moving all 4 extremities. Psychiatric: Normal mood and affect.  Laboratory Data: Lab Results  Component Value Date   WBC 9.6 06/10/2016   HGB 14.0 06/10/2016   HCT 41.5 06/10/2016   MCV 92.6 06/10/2016   PLT 239 06/10/2016    Lab Results  Component Value Date   CREATININE 0.86 06/10/2016    Urinalysis Results for orders placed or performed during the hospital encounter of 06/10/16  Urinalysis, Complete w Microscopic  Result Value Ref Range   Color, Urine YELLOW (A) YELLOW   APPearance HAZY (A) CLEAR   Specific Gravity, Urine 1.020 1.005 - 1.030   pH 5.0 5.0 - 8.0   Glucose, UA NEGATIVE NEGATIVE mg/dL   Hgb urine dipstick MODERATE (A) NEGATIVE   Bilirubin Urine NEGATIVE NEGATIVE   Ketones, ur  NEGATIVE NEGATIVE mg/dL   Protein, ur NEGATIVE NEGATIVE mg/dL   Nitrite NEGATIVE NEGATIVE   Leukocytes, UA NEGATIVE NEGATIVE   RBC / HPF 6-30 0 - 5 RBC/hpf   WBC, UA 0-5 0 - 5 WBC/hpf   Bacteria, UA NONE SEEN NONE SEEN   Squamous Epithelial / LPF 0-5 (A) NONE SEEN   Mucous PRESENT    Pertinent Imaging: CLINICAL DATA:  Flank pain  EXAM: CT ABDOMEN AND PELVIS WITHOUT CONTRAST  TECHNIQUE: Multidetector CT imaging of the abdomen and pelvis was performed following the standard protocol without IV contrast.  COMPARISON:  CT abdomen pelvis 05/15/2015  FINDINGS: Lower chest: No pulmonary nodules or pleural effusion. No visible pericardial effusion.  Hepatobiliary: Status post cholecystectomy.  Unremarkable liver.  Pancreas: Normal noncontrast appearance of the pancreas. No peripancreatic fluid collection.  Spleen: Normal.  Adrenal glands: Normal.  Urinary Tract:  --Right kidney/ureter: There are multiple right renal calculi, measuring up to 5 mm. No hydronephrosis or perinephric stranding. No ureteral obstruction.  --Left kidney/ureter: There are multiple left-sided renal calculi measuring up to 5 mm. No hydronephrosis or perinephric stranding. Within the midportion of the left ureter there is a calculus measuring 3 x 3 mm. The ureter is not dilated.  --Urinary bladder: Unremarkable.  Stomach/Bowel: No dilated loops of bowel. No evidence of colonic or enteric inflammation. No fluid collection within the abdomen.  Vascular/Lymphatic: No abdominal aortic aneurysm or atherosclerotic calcification. No abdominal or pelvic lymphadenopathy.  Reproductive: Status post hysterectomy.  Normal ovaries.  Musculoskeletal. No focal osseous lesion. Normal visualized extraperitoneal and extrathoracic soft tissues.  IMPRESSION: 1. Left ureteral calculus within the mid ureter measuring 3 x 3 mm. No associated hydronephrosis or perinephric stranding. 2. Multiple  bilateral nonobstructing renal calculi measuring up to 5 mm.   Electronically Signed   By: Kevin  Herman M.D.   On: 06/07/2016 00:44    CT scan personally reviewed  Assessment & Plan:    1. Left ureteral stone Left flank pain with a 3 mm left mid ureteral stone as well as multiple nonobstructing left intrarenal calculi. Symptomatic without signs or symptoms of infection.   Continued observation with medical expulsive therapy versus ureteroscopy were discussed. Given her multiple nonobstructing stones on that side, she is most interested in ureteroscopy to treat all of her stone burden on the left.      Risks and benefits of ureteroscopy were reviewed including but not limited to infection, bleeding, pain, ureteral injury which could require open surgery versus prolonged indwelling if ureteralperforation occurs, persistent stone disease, requirement for staged procedure, possible stent, and global anesthesia risks. Patient expressed understanding and desires to proceed with ureteroscopy.  - CULTURE, URINE COMPREHENSIVE  2. Bilateral nephrolithiasis Multiple bilateral calculi measuring up to 5 mm  3. Recurrent nephrolithiasis We'll attempt to get records from previous 24-hour urine, may need to adjust stone prevention medications/intervention   Return for schedule left ureteroscopy.  Gumaro Brightbill, MD  [facility] [address], [address] [place], [place] [address] [phone]  I spent 25 min with this patient of which greater than 50% was spent in counseling and coordination of care with the patient.    

## 2016-06-18 NOTE — Op Note (Signed)
Date of procedure: 06/18/16  Preoperative diagnosis:  1.  Left distal ureteral stone 2. Left nephrolithiasis  Postoperative diagnosis:  1. Same as above   Procedure: 1. Left ureteroscopy 2. Left laser lithotripsy 3. Left basket extraction of stone fragments  4. Left ureteral stent placement 5. Left retrograde pyelogram  Surgeon: Vanna Scotland, MD  Anesthesia: General  Complications: None  Intraoperative findings:  5 mm left distal ureteral stone treated as well as several small nonobstructing left renal calculi.   EBL: Minimal   Specimens: stone fragement  Drains: 6 x 24 French double-J ureteral stent on left   Indication: [name] is a 36 y.o. patient with gross hematuria found to have a 5 mm left distal ureteral obstructing calculus which failed to pass spontaneously.   After reviewing the management options for treatment, she elected to proceed with the above surgical procedure(s). We have discussed the potential benefits and risks of the procedure, side effects of the proposed treatment, the likelihood of the patient achieving the goals of the procedure, and any potential problems that might occur during the procedure or recuperation. Informed consent has been obtained.  Description of procedure:  The patient was taken to the operating room and general anesthesia was induced.  The patient was placed in the dorsal lithotomy position, prepped and draped in the usual sterile fashion, and preoperative antibiotics were administered. A preoperative time-out was performed.   21 French cystoscope was advanced per urethra into the bladder.  Attention was then turned to the left ureteral orifice which was also cannulated using a 5 [place] open-ended ureteral catheter. Gentle retrograde pyelogram was performed on this side revealing a small filling defect within the left distal ureter consistent with her known stone. There is minimal hydronephrosis on the side. Wire  was then placed up to level of the kidney without difficulty. A 4.5 French semirigid ureteroscope was then advanced alongside of the wire up to level of the stone. A 273  laser fiber was using the settings of 0.8 J and 10 Hz to fragment the stone into approximately 5 or 6 smaller pieces. Each of these pieces was then extracted using a 1.9 [place] to plus nitinol basket. The Super Stiff wire was then advanced through the scope up to level of the kidney to be used as a working wire.  A flexible 7 [place] Wolf ureteroscope was then advanced over this Super Stiff wire up to level of the renal pelvis. A few small stones were encountered in mid pole, and lower pole calyces. The laser fiber was used using the settings appear at 0.2 J and 40 Hz to dust the stones into very small fragments smaller than the tip of the laser fiber. The largest 2 fragments were extracted using a nitinol basket. Each time upon reintroducing the scope, a dual lumen access sheath was used within the distal ureter to reintroduce the Super Stiff wire and the scope was advanced over this back to the level of the renal pelvis. Finally, once the collecting system was adequately cleared of all stone burden, a retrograde pyelogram was performed at the level of the UPJ to create a roadmap of the kidney. Again, each and every calyx was strictly visualized. The scope was then backed down the length of the ureter inspecting along the way. There was no ureteral injury appreciated and no evidence of residual stone fragments. A 6 x 24 French double-J ureteral stent was advanced over the wire up to level of the renal pelvis.  The wire was partially drawn until full coil still within the renal pelvis. The wire was then fully withdrawn and full coil stone within the bladder. No string was left on the stent. The bladder was drained using the cystoscope sheath. She was then cleaned and dried, repositioned the supine position, reversed anesthesia, taken the PACU in  stable condition.  Plan: Patient will follow-up next week for cystoscopy, stent removal.   Vanna Scotland, M.D.

## 2016-06-18 NOTE — Progress Notes (Signed)
States pain is somewhat better  Still feeling pressure

## 2016-06-18 NOTE — Anesthesia Procedure Notes (Signed)
Procedure Name: Intubation Date/Time: 06/18/2016 9:21 AM Performed by: Justus Memory Pre-anesthesia Checklist: Patient identified Patient Re-evaluated:Patient Re-evaluated prior to inductionOxygen Delivery Method: Circle system utilized Preoxygenation: Pre-oxygenation with 100% oxygen Intubation Type: IV induction Ventilation: Mask ventilation without difficulty Laryngoscope Size: Mac and 3 Grade View: Grade I Tube type: Oral Tube size: 7.0 mm Number of attempts: 1 Airway Equipment and Method: Stylet Placement Confirmation: ETT inserted through vocal cords under direct vision,  positive ETCO2 and breath sounds checked- equal and bilateral Secured at: 21 cm Tube secured with: Tape Dental Injury: Teeth and Oropharynx as per pre-operative assessment

## 2016-06-18 NOTE — Discharge Instructions (Signed)
AMBULATORY SURGERY  DISCHARGE INSTRUCTIONS   1) The drugs that you were given will stay in your system until tomorrow so for the next 24 hours you should not:  A) Drive an automobile B) Make any legal decisions C) Drink any alcoholic beverage   2) You may resume regular meals tomorrow.  Today it is better to start with liquids and gradually work up to solid foods.  You may eat anything you prefer, but it is better to start with liquids, then soup and crackers, and gradually work up to solid foods.   3) Please notify your doctor immediately if you have any unusual bleeding, trouble breathing, redness and pain at the surgery site, drainage, fever, or pain not relieved by medication.    4) Additional Instructions:        Please contact your physician with any problems or Same Day Surgery at [phone], Monday through Friday 6 am to 4 pm, or [place] at [facility] number at [phone].AMBULATORY SURGERY  DISCHARGE INSTRUCTIONS   5) The drugs that you were given will stay in your system until tomorrow so for the next 24 hours you should not:  D) Drive an automobile E) Make any legal decisions F) Drink any alcoholic beverage   6) You may resume regular meals tomorrow.  Today it is better to start with liquids and gradually work up to solid foods.  You may eat anything you prefer, but it is better to start with liquids, then soup and crackers, and gradually work up to solid foods.   7) Please notify your doctor immediately if you have any unusual bleeding, trouble breathing, redness and pain at the surgery site, drainage, fever, or pain not relieved by medication.    8) Additional Instructions:        Please contact your physician with any problems or Same Day Surgery at [phone], Monday through Friday 6 am to 4 pm, or [place] at [facility] number at [phone].You have a ureteral stent in place.  This is a tube that extends from your kidney to  your bladder.  This may cause urinary bleeding, burning with urination, and urinary frequency.  Please call our office or present to the ED if you develop fevers >101 or pain which is not able to be controlled with oral pain medications.  You may be given either Flomax and/ or ditropan to help with bladder spasms and stent pain in addition to pain medications.    [facility] [address], [address] [place], [place] [address] [phone]

## 2016-06-18 NOTE — Anesthesia Postprocedure Evaluation (Signed)
Anesthesia Post Note  Patient: [name]  Procedure(s) Performed: Procedure(s) (LRB): CYSTOSCOPY/URETEROSCOPY/HOLMIUM LASER/STENT PLACEMENT (Left)  Patient location during evaluation: PACU Anesthesia Type: General Level of consciousness: awake and alert Pain management: pain level controlled Vital Signs Assessment: post-procedure vital signs reviewed and stable Respiratory status: spontaneous breathing, nonlabored ventilation, respiratory function stable and patient connected to nasal cannula oxygen Cardiovascular status: blood pressure returned to baseline and stable Postop Assessment: no signs of nausea or vomiting Anesthetic complications: no     Last Vitals:  Vitals:   06/18/16 1120 06/18/16 1134  BP: 118/84 120/75  Pulse: 62 62  Resp: 12 16  Temp: 37.1 C 36.4 C    Last Pain:  Vitals:   06/18/16 1134  TempSrc: Temporal  PainSc: 6                  Cleda Mccreedy Jamelyn Bovard

## 2016-06-18 NOTE — Anesthesia Post-op Follow-up Note (Cosign Needed)
Anesthesia QCDR form completed.        

## 2016-06-18 NOTE — Interval H&P Note (Signed)
History and Physical Interval Note:  06/18/2016 8:54 AM  [name]  has presented today for surgery, with the diagnosis of left ureteral stone  The various methods of treatment have been discussed with the patient and family. After consideration of risks, benefits and other options for treatment, the patient has consented to  Procedure(s): CYSTOSCOPY/URETEROSCOPY/HOLMIUM LASER/STENT PLACEMENT (Left) as a surgical intervention .  The patient's history has been reviewed, patient examined, no change in status, stable for surgery.  I have reviewed the patient's chart and labs.  Questions were answered to the patient's satisfaction.    Admitted over weekend for pain control without symptoms of infection.  Stone remains in place on KUB.  RRR CTAB   Vanna Scotland

## 2016-06-21 ENCOUNTER — Encounter: Payer: Self-pay | Admitting: Urology

## 2016-06-26 ENCOUNTER — Encounter: Payer: Self-pay | Admitting: Urology

## 2016-06-26 ENCOUNTER — Ambulatory Visit (INDEPENDENT_AMBULATORY_CARE_PROVIDER_SITE_OTHER): Payer: BLUE CROSS/BLUE SHIELD | Admitting: Urology

## 2016-06-26 VITALS — BP 119/77 | HR 73 | Ht 67.0 in | Wt 180.0 lb

## 2016-06-26 DIAGNOSIS — N201 Calculus of ureter: Secondary | ICD-10-CM | POA: Diagnosis not present

## 2016-06-26 LAB — URINALYSIS, COMPLETE
Bilirubin, UA: NEGATIVE
Glucose, UA: NEGATIVE
Ketones, UA: NEGATIVE
Nitrite, UA: NEGATIVE
Specific Gravity, UA: 1.02 (ref 1.005–1.030)
Urobilinogen, Ur: 0.2 mg/dL (ref 0.2–1.0)
pH, UA: 7.5 (ref 5.0–7.5)

## 2016-06-26 LAB — MICROSCOPIC EXAMINATION
RBC, UA: >30 /hpf — ABNORMAL HIGH (ref 0–?)
WBC, UA: NONE SEEN /hpf (ref 0–?)

## 2016-06-26 MED ORDER — LIDOCAINE HCL 2 % EX GEL
1.0000 "application " | Freq: Once | CUTANEOUS | Status: AC
  Administered 2016-06-26: 1 via URETHRAL

## 2016-06-26 MED ORDER — CIPROFLOXACIN HCL 500 MG PO TABS
500.0000 mg | ORAL_TABLET | Freq: Once | ORAL | Status: AC
  Administered 2016-06-26: 500 mg via ORAL

## 2016-06-26 NOTE — Progress Notes (Signed)
   06/26/16  CC:  Chief Complaint  Patient presents with  . Cysto Stent Removal    HPI: 36 year old female who underwent left ureteroscopy, laser lithotripsy returns today for cystoscopy, stent removal.  She proceeded to the operating room and 06/18/2016 for treatment of a 5 mm left distal ureteral stone as well as treatment of several nonobstructing left-sided stones. Procedure was uncomplicated.  She was having discomfort from the stent including urgency, frequency, and flank pain with voiding. No fevers or chills.  Blood pressure 119/77, pulse 73, height  (1.702 m), weight 180 lb (81.6 kg). NED. A&Ox3.   No respiratory distress   Abd soft, NT, ND Normal external genitalia with patent urethral meatus  Cystoscopy/ Stent removal procedure  Patient identification was confirmed, informed consent was obtained, and patient was prepped using Betadine solution.  Lidocaine jelly was administered per urethral meatus.    Preoperative abx where received prior to procedure.    Procedure: - Flexible cystoscope introduced, without any difficulty.   - Thorough search of the bladder revealed:    normal urethral meatus  Stent seen emanating from left ureteral orifice, grasped with stent graspers, and removed in entirety.    Post-Procedure: - Patient tolerated the procedure well   Assessment/ Plan:  1. Left ureteral stone Status post left ureteroscopy Stent removed today without complications Warning symptoms are reviewed Recommend follow-up renal ultrasound in 4 weeks - Urinalysis, Complete - ciprofloxacin (CIPRO) tablet 500 mg; Take 1 tablet (500 mg total) by mouth once. - lidocaine (XYLOCAINE) 2 % jelly 1 application; Place 1 application into the urethra once.  Return in about 4 weeks (around 07/24/2016) for renal ultrasound.  Vanna Scotland, MD

## 2016-07-03 LAB — STONE ANALYSIS
Ca Oxalate,Dihydrate: 5 %
Ca Oxalate,Monohydr.: 45 %
Ca phos cry stone ql IR: 50 %
Stone Weight KSTONE: 4.8 mg

## 2016-07-05 DIAGNOSIS — G43719 Chronic migraine without aura, intractable, without status migrainosus: Secondary | ICD-10-CM | POA: Diagnosis not present

## 2016-07-25 ENCOUNTER — Ambulatory Visit: Payer: Self-pay | Admitting: Urology

## 2016-07-30 ENCOUNTER — Ambulatory Visit
Admission: RE | Admit: 2016-07-30 | Discharge: 2016-07-30 | Disposition: A | Discharge status: Home / Self Care | Payer: BLUE CROSS/BLUE SHIELD | Source: Ambulatory Visit | Attending: Urology | Admitting: Urology

## 2016-07-30 DIAGNOSIS — N201 Calculus of ureter: Secondary | ICD-10-CM | POA: Diagnosis not present

## 2016-07-30 DIAGNOSIS — Z87442 Personal history of urinary calculi: Secondary | ICD-10-CM | POA: Diagnosis not present

## 2016-07-30 DIAGNOSIS — N261 Atrophy of kidney (terminal): Secondary | ICD-10-CM | POA: Insufficient documentation

## 2016-08-02 ENCOUNTER — Encounter: Payer: Self-pay | Admitting: Urology

## 2016-08-02 ENCOUNTER — Ambulatory Visit (INDEPENDENT_AMBULATORY_CARE_PROVIDER_SITE_OTHER): Payer: BLUE CROSS/BLUE SHIELD | Admitting: Urology

## 2016-08-02 VITALS — BP 112/77 | HR 71 | Ht 67.0 in | Wt 179.7 lb

## 2016-08-02 DIAGNOSIS — N200 Calculus of kidney: Secondary | ICD-10-CM | POA: Diagnosis not present

## 2016-08-02 NOTE — Progress Notes (Signed)
08/02/2016 9:15 AM   [name] 1980-04-03 [id number]  Referring provider: [name], PA-C [address] [facility] [address] [place], [place] [address]  Chief Complaint  Patient presents with  . Routine Post Op    4 week follow up  . Results    RUS    HPI: 36 year old female with a history of kidney stones who returns today following left ureteroscopy for treatment of obstructing distal ureteral calculus as well as nonobstructing calculi on 06/18/2016. Her stent was subsequently removed. Follow up renal ultrasound today shows no evidence of residual stone burden hydroureteronephrosis.  She denies any flank pain, dysuria, hematuria, or any other symptoms. She is feeling well today.  She was previously followed by Karena Addison, NP in our clinic ~ 2 years ago.  She has had a previous stone analysis and 24-hour urine but the records are not available to me today.  Stone analysis shows 5% calcium oxalate dihydrate, 45% calcium oxalate monohydrate, and 50% calcium phosphate.  She is been previously managed on potassium citrate. This upsets her stomach and she is only able to take this medication every other day at most.    She has had ESWL x 4 and ureteroscopy/ stent in the past.  Her most recent procedure ~ 2years ago or more prior to this.  She reports that she generally has stone episodes every other year around this time of year.     PMH: Past Medical History:  Diagnosis Date  . Acute pyelonephritis 06/29/2012  . Anxiety   . Asthma    AS A CHILD  . Complication of anesthesia   . Gallstones   . GERD (gastroesophageal reflux disease)   . History of kidney stones   . Kidney stones    bilateral  . Migraine   . Pelvic pain in female   . PONV (postoperative nausea and vomiting)    WITH HYSTERECTOMY ONLY  . Pyelonephritis 2015  . Renal mass    right  . Tobacco abuse     Surgical History: Past Surgical History:  Procedure Laterality Date  . ABDOMINAL  HYSTERECTOMY  2006  . ANKLE ARTHROSCOPY Left   . ANKLE ARTHROSCOPY Left 11/24/2015  . APPENDECTOMY  07/26/13  . CAST APPLICATION  12/25/2011   Procedure: CAST APPLICATION;  Surgeon: Javier Docker, MD;  Location: [facility];  Service: Orthopedics;  Laterality: Left;  . CHOLECYSTECTOMY  03/11/2011   Procedure: LAPAROSCOPIC CHOLECYSTECTOMY WITH INTRAOPERATIVE CHOLANGIOGRAM;  Surgeon: Atilano Ina, MD;  Location: [facility] OR;  Service: General;  Laterality: N/A;  . CHOLECYSTECTOMY, LAPAROSCOPIC    . CYSTOSCOPY/URETEROSCOPY/HOLMIUM LASER/STENT PLACEMENT Left 06/18/2016   Procedure: CYSTOSCOPY/URETEROSCOPY/HOLMIUM LASER/STENT PLACEMENT;  Surgeon: Vanna Scotland, MD;  Location: [facility] ORS;  Service: Urology;  Laterality: Left;  . LITHOTRIPSY     x 4  . ORIF ANKLE FRACTURE  12/25/2011   Procedure: OPEN REDUCTION INTERNAL FIXATION (ORIF) ANKLE FRACTURE;  Surgeon: Javier Docker, MD;  Location: [facility];  Service: Orthopedics;  Laterality: Left;  . TONSILLECTOMY      Home Medications:  Allergies as of 08/02/2016      Reactions   Compazine Other (See Comments)   Dystonia   Metoclopramide Other (See Comments)      Medication List       Accurate as of 08/02/16  9:15 AM. Always use your most recent med list.          ALPRAZolam 1 MG tablet Commonly known as:  XANAX TAKE 1 TABLET BY MOUTH EVERY  DAY AT BEDTIME AND 1/2 TABLET DAILY AS NEEDED   baclofen 10 MG tablet Commonly known as:  LIORESAL Take 10 mg by mouth 2 (two) times daily as needed.   chlorproMAZINE 25 MG tablet Commonly known as:  THORAZINE chlorpromazine 25 mg tablet   HYDROcodone-acetaminophen 5-325 MG tablet Commonly known as:  NORCO Take 1 tablet by mouth every 6 (six) hours as needed for moderate pain.   methylPREDNISolone 4 MG Tbpk tablet Commonly known as:  MEDROL DOSEPAK methylprednisolone 4 mg tablets in a dose pack   multivitamin with minerals Tabs tablet Take 1 tablet by mouth daily.   omeprazole 40 MG capsule Commonly  known as:  PRILOSEC omeprazole 40 mg capsule,delayed release   oxybutynin 5 MG tablet Commonly known as:  DITROPAN Take 1 tablet (5 mg total) by mouth every 8 (eight) hours as needed for bladder spasms.   promethazine 25 MG tablet Commonly known as:  PHENERGAN Take 1 tablet (25 mg total) by mouth every 8 (eight) hours as needed for nausea or vomiting.   tamsulosin 0.4 MG Caps capsule Commonly known as:  FLOMAX Take 1 capsule (0.4 mg total) by mouth daily.   traMADol 50 MG tablet Commonly known as:  ULTRAM tramadol 50 mg tablet  TAKE 1 TABLET(S) EVERY 6 HOURS BY ORAL ROUTE.   valACYclovir 500 MG tablet Commonly known as:  VALTREX Take 1-2 tablets (500-1,000 mg total) by mouth 2 (two) times daily as needed.   zonisamide 100 MG capsule Commonly known as:  ZONEGRAN Take 200 mg by mouth at bedtime.       Allergies:  Allergies  Allergen Reactions  . Compazine Other (See Comments)    Dystonia  . Metoclopramide Other (See Comments)    Family History: Family History  Problem Relation Age of Onset  . Diabetes Mother   . Diabetes Father   . Heart failure Father   . Hypertension Father   . Pulmonary fibrosis Father   . Nephrolithiasis Father   . Kidney cancer Neg Hx   . Bladder Cancer Neg Hx     Social History:  reports that she quit smoking about 22 months ago. Her smoking use included Cigarettes. She started smoking about 16 years ago. She has a 7.50 pack-year smoking history. She has never used smokeless tobacco. She reports that she drinks alcohol. She reports that she does not use drugs.  ROS: UROLOGY Frequent Urination?: No Hard to postpone urination?: No Burning/pain with urination?: No Get up at night to urinate?: No Leakage of urine?: No Urine stream starts and stops?: No Trouble starting stream?: No Do you have to strain to urinate?: No Blood in urine?: No Urinary tract infection?: No Sexually transmitted disease?: No Injury to kidneys or bladder?:  No Painful intercourse?: No Weak stream?: No Currently pregnant?: No Vaginal bleeding?: No Last menstrual period?: n  Gastrointestinal Nausea?: No Vomiting?: No Indigestion/heartburn?: No Diarrhea?: No Constipation?: No  Constitutional Fever: No Night sweats?: No Weight loss?: No Fatigue?: No  Skin Skin rash/lesions?: No Itching?: No  Eyes Blurred vision?: No Double vision?: No  Ears/Nose/Throat Sore throat?: No Sinus problems?: No  Hematologic/Lymphatic Swollen glands?: No Easy bruising?: No  Cardiovascular Leg swelling?: No Chest pain?: No  Respiratory Cough?: No Shortness of breath?: No  Endocrine Excessive thirst?: No  Musculoskeletal Back pain?: No Joint pain?: No  Neurological Headaches?: No Dizziness?: No  Psychologic Depression?: No Anxiety?: No  Physical Exam: BP 112/77   Pulse 71   Ht 5\' 7"  (1.702 m)  Wt 179 lb 11.2 oz (81.5 kg)   BMI 28.14 kg/m   Constitutional:  Alert and oriented, No acute distress. HEENT: [place] AT, moist mucus membranes.  Trachea midline, no masses. Cardiovascular: No clubbing, cyanosis, or edema. Respiratory: Normal respiratory effort, no increased work of breathing. GI: Abdomen is soft, nontender, nondistended, no abdominal masses GU: No CVA tenderness bilaterally. Skin: No rashes, bruises or suspicious lesions. Neurologic: Grossly intact, no focal deficits, moving all 4 extremities. Psychiatric: Normal mood and affect.  Laboratory Data: Lab Results  Component Value Date   WBC 6.4 06/16/2016   HGB 12.7 06/16/2016   HCT 38.2 06/16/2016   MCV 93.5 06/16/2016   PLT 175 06/16/2016    Lab Results  Component Value Date   CREATININE 0.71 06/17/2016    Urinalysis n/a   Pertinent Imaging: CLINICAL DATA:  LEFT ureteral stone, history of renal stones post lithotripsy and ureteroscopy 4 weeks ago with stent placement  EXAM: RENAL / URINARY TRACT ULTRASOUND COMPLETE  COMPARISON:   06/10/2016  FINDINGS: Right Kidney:  Length: 11.3 cm. Cortical thinning. Normal cortical echogenicity. No mass, hydronephrosis, or shadowing calcification  Left Kidney:  Length: 11.5 cm. Mild cortical thinning with upper normal cortical echogenicity. No definite mass, hydronephrosis or shadowing calcification. No definite ureteral stent identified.  Bladder:  Appears normal for degree of bladder distention. BILATERAL ureteral jets visualized.  IMPRESSION: Renal cortical atrophy bilaterally without evidence of hydronephrosis.   Electronically Signed   By: Ulyses Southward M.D.   On: 07/30/2016 17:10  Renal ultrasound images were reviewed personally today.  Assessment & Plan:    1. Left ureteral stone S/p uncomplicated URS 05/2016 No residual stones or hydronephrosis  2. Right nephrolithiasis  Small punctate right stones present on most recent CT scan 4/18 Conservative management at this time  3. Recurrent nephrolithiasis We'll attempt to get records from previous 24-hour urine, may need to adjust stone prevention medications/intervention  We discussed general stone prevention techniques including drinking plenty water with goal of producing 2.5 L urine daily, increased citric acid intake, avoidance of high oxalate containing foods, and decreased salt intake.  Information about dietary recommendations given today.   If previous urine calcium was elevated, may consider starting on low-dose indapamide for stone prevention given inability to tolerate potassium citrate   Return in about 1 year (around 08/02/2017) for KUB.  Vanna Scotland, MD  [facility] [address] [place], [place] [address] [phone]

## 2016-08-05 ENCOUNTER — Encounter: Payer: Self-pay | Admitting: Physician Assistant

## 2016-08-05 ENCOUNTER — Ambulatory Visit (INDEPENDENT_AMBULATORY_CARE_PROVIDER_SITE_OTHER): Payer: BLUE CROSS/BLUE SHIELD | Admitting: Physician Assistant

## 2016-08-05 ENCOUNTER — Other Ambulatory Visit: Payer: Self-pay | Admitting: Obstetrics and Gynecology

## 2016-08-05 VITALS — BP 104/68 | HR 76 | Temp 98.5°F | Resp 16 | Wt 180.0 lb

## 2016-08-05 DIAGNOSIS — J029 Acute pharyngitis, unspecified: Secondary | ICD-10-CM

## 2016-08-05 DIAGNOSIS — J020 Streptococcal pharyngitis: Secondary | ICD-10-CM

## 2016-08-05 LAB — POCT RAPID STREP A (OFFICE): Rapid Strep A Screen: POSITIVE — AB

## 2016-08-05 MED ORDER — AMOXICILLIN-POT CLAVULANATE 875-125 MG PO TABS: 1.0000 | ORAL_TABLET | Freq: Two times a day (BID) | ORAL | 0 refills | Status: DC

## 2016-08-05 NOTE — Progress Notes (Signed)
Patient: [name] Female    DOB: [birth year]   36 y.o.   MRN: [id number] Visit Date: 08/05/2016  Today's Provider: Trey Sailors, PA-C   Chief Complaint  Patient presents with  . 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 She additionally had a tick bite 3 weeks ago but the tick was attached for <24 hours and she has not had GI symptoms or rash since then.   Fever   This is a new problem. The current episode started yesterday. The maximum temperature noted was 102 to 102.9 F (Highest was 102 last night.). The temperature was taken using an oral thermometer. Associated symptoms include congestion, nausea and a sore throat. Pertinent negatives include no abdominal pain, chest pain, coughing, diarrhea, ear pain, headaches, muscle aches, rash, sleepiness, urinary pain, vomiting or wheezing. Associated symptoms comments: Also had a tice bite 3 weeks ago. .       Allergies  Allergen Reactions  . Compazine Other (See Comments)    Dystonia  . Metoclopramide Other (See Comments)     Current Outpatient Prescriptions:  .  ALPRAZolam (XANAX) 1 MG tablet, TAKE 1 TABLET BY MOUTH EVERY DAY AT BEDTIME AND 1/2 TABLET DAILY AS NEEDED (Patient taking differently: TAKE 1 TABLET BY MOUTH EVERY DAY AT BEDTIME), Disp: 45 tablet, Rfl: 5 .  baclofen (LIORESAL) 10 MG tablet, Take 10 mg by mouth 2 (two) times daily as needed. , Disp: , Rfl: 0 .  chlorproMAZINE (THORAZINE) 25 MG tablet, chlorpromazine 25 mg tablet, Disp: , Rfl:  .  Multiple Vitamin (MULTIVITAMIN WITH MINERALS) TABS tablet, Take 1 tablet by mouth daily., Disp: , Rfl:  .  omeprazole (PRILOSEC) 40 MG capsule, omeprazole 40 mg capsule,delayed release, Disp: , Rfl:  .  promethazine (PHENERGAN) 25 MG tablet, Take 1 tablet (25 mg total) by mouth every 8 (eight) hours as needed for nausea or vomiting., Disp: 30 tablet, Rfl: 0 .   valACYclovir (VALTREX) 500 MG tablet, Take 1-2 tablets (500-1,000 mg total) by mouth 2 (two) times daily as needed. (Patient taking differently: Take 1,000 mg by mouth 2 (two) times daily as needed (cold sores). ), Disp: 30 tablet, Rfl: 0 .  zonisamide (ZONEGRAN) 100 MG capsule, Take 200 mg by mouth at bedtime., Disp: , Rfl: 0  Review of Systems  Constitutional: Positive for fatigue and fever. Negative for activity change, appetite change, chills, diaphoresis and unexpected weight change.  HENT: Positive for congestion, postnasal drip and sore throat. Negative for ear pain, nosebleeds, rhinorrhea, sinus pain, sinus pressure, sneezing, tinnitus, trouble swallowing and voice change.   Eyes: Negative.   Respiratory: Negative for apnea, cough, choking, chest tightness, shortness of breath, wheezing and stridor.   Cardiovascular: Negative.  Negative for chest pain.  Gastrointestinal: Positive for nausea. Negative for abdominal distention, abdominal pain, anal bleeding, blood in stool, constipation, diarrhea, rectal pain and vomiting.  Genitourinary: Negative for dysuria.  Skin: Negative for rash.  Neurological: Negative for dizziness, light-headedness and headaches.    Social History  Substance Use Topics  . Smoking status: Former Smoker    Packs/day: 0.50    Years: 15.00    Types: Cigarettes    Start date: 11/03/1999    Quit date: 09/26/2014  . Smokeless tobacco: Never Used  . Alcohol use 0.0 oz/week     Comment: social-rare   Objective:  BP 104/68 (BP Location: Left Arm, Patient Position: Sitting, Cuff Size: Normal)   Pulse 76   Temp 98.5 F (36.9 C) (Oral)   Resp 16   Wt 180 lb (81.6 kg)   BMI 28.19 kg/m  Vitals:   08/05/16 1312  BP: 104/68  Pulse: 76  Resp: 16  Temp: 98.5 F (36.9 C)  TempSrc: Oral  Weight: 180 lb (81.6 kg)     Physical Exam  Constitutional: She appears well-developed and well-nourished. No distress.  HENT:  Right Ear: Tympanic membrane and external ear  normal.  Left Ear: Tympanic membrane and external ear normal.  Mouth/Throat: Oropharyngeal exudate present.  Absent tonsils but moderate white exudate in oropharynx.  Neck: Neck supple.  Cardiovascular: Normal rate and regular rhythm.   Pulmonary/Chest: Effort normal and breath sounds normal.  Abdominal: Soft. Bowel sounds are normal.  Lymphadenopathy:    She has no cervical adenopathy.  Skin: Skin is warm and dry.  Psychiatric: She has a normal mood and affect. Her behavior is normal.        Assessment & Plan:     1. Strep pharyngitis  Rapid strep positive. Will treat as below 2/2 severity. Counseled that she stops being contagious 24 hours after antibiotics and provided with worknote. With clear respiratory cause and low risk event with tick, will not cover for tick borne illness and patient agrees.  - amoxicillin-clavulanate (AUGMENTIN) 875-125 MG tablet; Take 1 tablet by mouth 2 (two) times daily.  Dispense: 20 tablet; Refill: 0  2. Sore throat  - POCT rapid strep A  Return if symptoms worsen or fail to improve.  The entirety of the information documented in the History of Present Illness, Review of Systems and Physical Exam were personally obtained by me. Portions of this information were initially documented by Kavin LeechLaura Walsh, CMA and reviewed by me for thoroughness and accuracy.         Trey SailorsAdriana M Pollak, PA-C  [facility]

## 2016-08-05 NOTE — Patient Instructions (Signed)

## 2016-08-15 ENCOUNTER — Encounter: Payer: Self-pay | Admitting: Physician Assistant

## 2016-08-15 ENCOUNTER — Ambulatory Visit (INDEPENDENT_AMBULATORY_CARE_PROVIDER_SITE_OTHER): Payer: BLUE CROSS/BLUE SHIELD | Admitting: Physician Assistant

## 2016-08-15 VITALS — BP 104/62 | HR 88 | Temp 97.6°F | Resp 16 | Ht 67.0 in | Wt 182.0 lb

## 2016-08-15 DIAGNOSIS — N200 Calculus of kidney: Secondary | ICD-10-CM | POA: Diagnosis not present

## 2016-08-15 DIAGNOSIS — K5909 Other constipation: Secondary | ICD-10-CM

## 2016-08-15 DIAGNOSIS — G43009 Migraine without aura, not intractable, without status migrainosus: Secondary | ICD-10-CM

## 2016-08-15 DIAGNOSIS — F5101 Primary insomnia: Secondary | ICD-10-CM | POA: Diagnosis not present

## 2016-08-15 DIAGNOSIS — Z0000 Encounter for general adult medical examination without abnormal findings: Secondary | ICD-10-CM | POA: Diagnosis not present

## 2016-08-15 DIAGNOSIS — F411 Generalized anxiety disorder: Secondary | ICD-10-CM

## 2016-08-15 DIAGNOSIS — Z6828 Body mass index (BMI) 28.0-28.9, adult: Secondary | ICD-10-CM

## 2016-08-15 LAB — POCT URINALYSIS DIPSTICK
Bilirubin, UA: NEGATIVE
Blood, UA: NEGATIVE
Glucose, UA: NEGATIVE
Ketones, UA: NEGATIVE
Leukocytes, UA: NEGATIVE
Nitrite, UA: NEGATIVE
Protein, UA: NEGATIVE
Spec Grav, UA: 1.01 (ref 1.010–1.025)
Urobilinogen, UA: 0.2 E.U./dL
pH, UA: 6.5 (ref 5.0–8.0)

## 2016-08-15 MED ORDER — ALPRAZOLAM 1 MG PO TABS: 0.5000 mg | ORAL_TABLET | Freq: Three times a day (TID) | ORAL | 5 refills | Status: DC | PRN

## 2016-08-15 NOTE — Patient Instructions (Signed)

## 2016-08-15 NOTE — Progress Notes (Signed)
Patient: [name], Female    DOB: [birth year], 36 y.o.   MRN: [id number] Visit Date: 08/15/2016  Today's Provider: [name], PA-C   Chief Complaint  Patient presents with  . Annual Exam   Subjective:    Annual physical exam [name] is a 36 y.o. female who presents today for health maintenance and complete physical. She feels well. She reports exercising . She reports she is sleeping fairly well. -----------------------------------------------------------------  Last CPE- 08/03/15  She also wants to discuss her anxiety. She has been having worsening anxiety with obsessive thoughts about her daughter and husband dying. Both of her parents passed young and her mother struggled with addiction. She has never had counseling. She reports some months are good, some are bad. She does not want to restart any antidepression medications as she reports having bad experiences in the past with them. She has used lexapro, zoloft and paxil in the past.  Immunization History  Administered Date(s) Administered  . Influenza-Unspecified 01/03/2015  . Tdap 03/22/2011    Review of Systems  Constitutional: Negative.   HENT: Negative.   Eyes: Negative.   Cardiovascular: Negative.   Gastrointestinal: Positive for constipation.  Endocrine: Negative.   Genitourinary: Negative.   Musculoskeletal: Positive for arthralgias, back pain and myalgias.  Skin: Negative.   Allergic/Immunologic: Negative.   Neurological: Positive for headaches.  Hematological: Negative.   Psychiatric/Behavioral: Positive for agitation. The patient is nervous/anxious.     Social History      She  reports that she quit smoking about 22 months ago. Her smoking use included Cigarettes. She started smoking about 16 years ago. She has a 7.50 pack-year smoking history. She has never used smokeless tobacco. She reports that she drinks alcohol. She reports that she does not use drugs.       Social  History   Social History  . Marital status: Married    Spouse name: N/A  . Number of children: N/A  . Years of education: N/A   Social History Main Topics  . Smoking status: Former Smoker    Packs/day: 0.50    Years: 15.00    Types: Cigarettes    Start date: 11/03/1999    Quit date: 09/26/2014  . Smokeless tobacco: Never Used  . Alcohol use 0.0 oz/week     Comment: social-rare  . Drug use: No  . Sexual activity: Yes    Birth control/ protection: Surgical   Other Topics Concern  . None   Social History Narrative  . None    Past Medical History:  Diagnosis Date  . Acute pyelonephritis 06/29/2012  . Anxiety   . Asthma    AS A CHILD  . Complication of anesthesia   . Gallstones   . GERD (gastroesophageal reflux disease)   . History of kidney stones   . Kidney stones    bilateral  . Migraine   . Pelvic pain in female   . PONV (postoperative nausea and vomiting)    WITH HYSTERECTOMY ONLY  . Pyelonephritis 2015  . Renal mass    right  . Tobacco abuse      Patient Active Problem List   Diagnosis Date Noted  . IBS (irritable bowel syndrome) 02/02/2015  . Herpes simplex 02/02/2015  . Asthma, mild intermittent 11/03/2014  . Migraine without aura or status migrainosus 11/03/2014  . Gastroesophageal reflux disease without esophagitis 11/03/2014  . Generalized anxiety disorder 11/03/2014  . Other and unspecified ovarian cyst  08/06/2013  . Left lower quadrant pain 07/16/2013  . Appendicolith 07/16/2013  . Chronic constipation 06/30/2012  . Gross hematuria 06/30/2012  . History of pyelonephritis 06/29/2012  . Nephrolithiasis 06/28/2012    Past Surgical History:  Procedure Laterality Date  . ABDOMINAL HYSTERECTOMY  2006  . ANKLE ARTHROSCOPY Left   . ANKLE ARTHROSCOPY Left 11/24/2015  . APPENDECTOMY  07/26/13  . CAST APPLICATION  12/25/2011   Procedure: CAST APPLICATION;  Surgeon: Javier Docker, MD;  Location: [facility];  Service: Orthopedics;  Laterality: Left;  .  CHOLECYSTECTOMY  03/11/2011   Procedure: LAPAROSCOPIC CHOLECYSTECTOMY WITH INTRAOPERATIVE CHOLANGIOGRAM;  Surgeon: Atilano Ina, MD;  Location[facility] OR;  Service: General;  Laterality: N/A;  . CHOLECYSTECTOMY, LAPAROSCOPIC    . CYSTOSCOPY/URETEROSCOPY/HOLMIUM LASER/STENT PLACEMENT Left 06/18/2016   Procedure: CYSTOSCOPY/URETEROSCOPY/HOLMIUM LASER/STENT PLACEMENT;  Surgeon: Vanna Scotland, MD;  Location: [facility] ORS;  Service: Urology;  Laterality: Left;  . LITHOTRIPSY     x 4  . ORIF ANKLE FRACTURE  12/25/2011   Procedure: OPEN REDUCTION INTERNAL FIXATION (ORIF) ANKLE FRACTURE;  Surgeon: Javier Docker, MD;  Location: [facility];  Service: Orthopedics;  Laterality: Left;  . TONSILLECTOMY      Family History        Family Status  Relation Status  . Mother Deceased  . Father Deceased  . Sister Alive  . Brother Alive  . Brother Alive  . Brother Alive  . Neg Hx (Not Specified)        Her family history includes Diabetes in her father and mother; Heart failure in her father; Hypertension in her father; Nephrolithiasis in her father; Pulmonary fibrosis in her father.     Allergies  Allergen Reactions  . Compazine Other (See Comments)    Dystonia  . Metoclopramide Other (See Comments)     Current Outpatient Prescriptions:  .  ALPRAZolam (XANAX) 1 MG tablet, TAKE 1 TABLET BY MOUTH EVERY DAY AT BEDTIME AND 1/2 TABLET DAILY AS NEEDED (Patient taking differently: TAKE 1 TABLET BY MOUTH EVERY DAY AT BEDTIME), Disp: 45 tablet, Rfl: 5 .  baclofen (LIORESAL) 10 MG tablet, Take 10 mg by mouth 2 (two) times daily as needed. , Disp: , Rfl: 0 .  chlorproMAZINE (THORAZINE) 25 MG tablet, chlorpromazine 25 mg tablet, Disp: , Rfl:  .  Multiple Vitamin (MULTIVITAMIN WITH MINERALS) TABS tablet, Take 1 tablet by mouth daily., Disp: , Rfl:  .  omeprazole (PRILOSEC) 40 MG capsule, omeprazole 40 mg capsule,delayed release, Disp: , Rfl:  .  promethazine (PHENERGAN) 25 MG tablet, Take 1 tablet (25 mg total) by mouth  every 8 (eight) hours as needed for nausea or vomiting., Disp: 30 tablet, Rfl: 0 .  valACYclovir (VALTREX) 500 MG tablet, Take 1-2 tablets (500-1,000 mg total) by mouth 2 (two) times daily as needed. (Patient taking differently: Take 1,000 mg by mouth 2 (two) times daily as needed (cold sores). ), Disp: 30 tablet, Rfl: 0 .  zonisamide (ZONEGRAN) 100 MG capsule, Take 200 mg by mouth at bedtime., Disp: , Rfl: 0 .  amoxicillin-clavulanate (AUGMENTIN) 875-125 MG tablet, Take 1 tablet by mouth 2 (two) times daily. (Patient not taking: Reported on 08/15/2016), Disp: 20 tablet, Rfl: 0   Patient Care Team: [name], PA-C as PCP - General (Family Medicine) Alba Cory, MD as Attending Physician (Family Medicine) Lemar Livings Merrily Pew, MD (General Surgery)      Objective:   Vitals: BP 104/62 (BP Location: Left Arm, Patient Position: Sitting, Cuff Size: Normal)  Pulse 88   Temp 97.6 F (36.4 C) (Oral)   Resp 16   Ht 5\' 7"  (1.702 m)   Wt 182 lb (82.6 kg)   BMI 28.51 kg/m    Vitals:   08/15/16 1402  BP: 104/62  Pulse: 88  Resp: 16  Temp: 97.6 F (36.4 C)  TempSrc: Oral  Weight: 182 lb (82.6 kg)  Height: 5\' 7"  (1.702 m)     Physical Exam  Constitutional: She is oriented to person, place, and time. She appears well-developed and well-nourished. No distress.  HENT:  Head: Normocephalic and atraumatic.  Right Ear: Hearing, tympanic membrane, external ear and ear canal normal.  Left Ear: Hearing, tympanic membrane, external ear and ear canal normal.  Nose: Nose normal.  Mouth/Throat: Uvula is midline, oropharynx is clear and moist and mucous membranes are normal. No oropharyngeal exudate.  Eyes: Conjunctivae and EOM are normal. Pupils are equal, round, and reactive to light. Right eye exhibits no discharge. Left eye exhibits no discharge. No scleral icterus.  Neck: Normal range of motion. Neck supple. No JVD present. No tracheal deviation present. No thyromegaly present.    Cardiovascular: Normal rate, regular rhythm, normal heart sounds and intact distal pulses.  Exam reveals no gallop and no friction rub.   No murmur heard. Pulmonary/Chest: Effort normal and breath sounds normal. No respiratory distress. She has no decreased breath sounds. She has no wheezes. She has no rales. She exhibits no tenderness. Right breast exhibits no inverted nipple, no mass, no nipple discharge, no skin change and no tenderness. Left breast exhibits no inverted nipple, no mass, no nipple discharge, no skin change and no tenderness. Breasts are symmetrical.  Abdominal: Soft. Bowel sounds are normal. She exhibits no distension and no mass. There is no tenderness. There is no rebound and no guarding.  Musculoskeletal: Normal range of motion. She exhibits no edema or tenderness.  Lymphadenopathy:    She has no cervical adenopathy.  Neurological: She is alert and oriented to person, place, and time.  Skin: Skin is warm and dry. No rash noted. She is not diaphoretic.  Psychiatric: She has a normal mood and affect. Her behavior is normal. Judgment and thought content normal.  Vitals reviewed.    Depression Screen PHQ 2/9 Scores 08/15/2016 02/02/2015 11/03/2014  PHQ - 2 Score 0 0 0  PHQ- 9 Score 2 - -      Assessment & Plan:     Routine Health Maintenance and Physical Exam  Exercise Activities and Dietary recommendations Goals    None      Immunization History  Administered Date(s) Administered  . Influenza-Unspecified 01/03/2015  . Tdap 03/22/2011    Health Maintenance  Topic Date Due  . PAP SMEAR  12/07/2019 (Originally 06/11/1999)  . INFLUENZA VACCINE  09/25/2016  . TETANUS/TDAP  03/21/2021  . HIV Screening  Completed     Discussed health benefits of physical activity, and encouraged her to engage in regular exercise appropriate for her age and condition.    1. Annual physical exam UA normal. Normal physical exam today. Will check labs as below and f/u pending  lab results. If labs are stable and WNL she will not need to have these rechecked for one year at her next annual physical exam. She is to call the office in the meantime if she has any acute issue, questions or concerns. - POCT urinalysis dipstick - CBC w/Diff/Platelet - Comprehensive Metabolic Panel (CMET) - TSH - Lipid Profile  2. Generalized anxiety disorder  Referral for counseling placed. Xanax increased so she may use up to 3 times per day. Patient refuses anti-depression medications. She is to call if symptoms worsen before she is seen. - Ambulatory referral to Psychology - ALPRAZolam (XANAX) 1 MG tablet; Take 0.5-1 tablets (0.5-1 mg total) by mouth 3 (three) times daily as needed for anxiety.  Dispense: 90 tablet; Refill: 5  3. Nephrolithiasis Currently stable. Followed by Urology, Dr. Apolinar JunesBrandon. Was told she has some mild renal atrophy that will be followed annually by urology.  4. Chronic constipation Stable with daily miralax.   5. Migraine without aura and without status migrainosus, not intractable [place] [facility] [place]. Uses Zonisamide and chlorpromamazine.  6. BMI 28.0-28.9,adult Starting to hike again. Counseled patient on healthy lifestyle modifications including dieting and exercise.   7. Primary insomnia See above medical treatment plan for #2. - Ambulatory referral to Psychology - ALPRAZolam (XANAX) 1 MG tablet; Take 0.5-1 tablets (0.5-1 mg total) by mouth 3 (three) times daily as needed for anxiety.  Dispense: 90 tablet; Refill: 5  --------------------------------------------------------------------    Margaretann LovelessJennifer M Burnette, PA-C  [facility]

## 2016-09-20 DIAGNOSIS — M542 Cervicalgia: Secondary | ICD-10-CM | POA: Diagnosis not present

## 2016-09-20 DIAGNOSIS — G518 Other disorders of facial nerve: Secondary | ICD-10-CM | POA: Diagnosis not present

## 2016-09-20 DIAGNOSIS — M791 Myalgia: Secondary | ICD-10-CM | POA: Diagnosis not present

## 2016-09-20 DIAGNOSIS — G43719 Chronic migraine without aura, intractable, without status migrainosus: Secondary | ICD-10-CM | POA: Diagnosis not present

## 2016-10-04 DIAGNOSIS — M791 Myalgia: Secondary | ICD-10-CM | POA: Diagnosis not present

## 2016-10-04 DIAGNOSIS — G518 Other disorders of facial nerve: Secondary | ICD-10-CM | POA: Diagnosis not present

## 2016-10-04 DIAGNOSIS — M542 Cervicalgia: Secondary | ICD-10-CM | POA: Diagnosis not present

## 2016-10-04 DIAGNOSIS — G43719 Chronic migraine without aura, intractable, without status migrainosus: Secondary | ICD-10-CM | POA: Diagnosis not present

## 2016-10-07 ENCOUNTER — Other Ambulatory Visit: Payer: Self-pay | Admitting: Family Medicine

## 2016-10-07 DIAGNOSIS — B009 Herpesviral infection, unspecified: Secondary | ICD-10-CM

## 2016-10-08 ENCOUNTER — Other Ambulatory Visit: Payer: Self-pay

## 2016-10-08 ENCOUNTER — Encounter: Payer: Self-pay | Admitting: Physician Assistant

## 2016-10-08 DIAGNOSIS — B009 Herpesviral infection, unspecified: Secondary | ICD-10-CM

## 2016-10-08 MED ORDER — VALACYCLOVIR HCL 500 MG PO TABS: 1000.0000 mg | ORAL_TABLET | Freq: Two times a day (BID) | ORAL | 5 refills | Status: DC | PRN

## 2016-10-15 DIAGNOSIS — G43719 Chronic migraine without aura, intractable, without status migrainosus: Secondary | ICD-10-CM | POA: Diagnosis not present

## 2016-10-23 DIAGNOSIS — G43719 Chronic migraine without aura, intractable, without status migrainosus: Secondary | ICD-10-CM | POA: Diagnosis not present

## 2016-10-23 DIAGNOSIS — G518 Other disorders of facial nerve: Secondary | ICD-10-CM | POA: Diagnosis not present

## 2016-10-23 DIAGNOSIS — M791 Myalgia: Secondary | ICD-10-CM | POA: Diagnosis not present

## 2016-10-23 DIAGNOSIS — M542 Cervicalgia: Secondary | ICD-10-CM | POA: Diagnosis not present

## 2016-12-06 DIAGNOSIS — G43719 Chronic migraine without aura, intractable, without status migrainosus: Secondary | ICD-10-CM | POA: Diagnosis not present

## 2016-12-06 DIAGNOSIS — M791 Myalgia, unspecified site: Secondary | ICD-10-CM | POA: Diagnosis not present

## 2016-12-06 DIAGNOSIS — M542 Cervicalgia: Secondary | ICD-10-CM | POA: Diagnosis not present

## 2016-12-06 DIAGNOSIS — G518 Other disorders of facial nerve: Secondary | ICD-10-CM | POA: Diagnosis not present

## 2017-01-10 ENCOUNTER — Encounter: Payer: Self-pay | Admitting: Physician Assistant

## 2017-01-10 ENCOUNTER — Ambulatory Visit: Payer: BLUE CROSS/BLUE SHIELD | Admitting: Physician Assistant

## 2017-01-10 VITALS — BP 110/86 | HR 70 | Temp 98.1°F | Resp 16 | Wt 190.0 lb

## 2017-01-10 DIAGNOSIS — F411 Generalized anxiety disorder: Secondary | ICD-10-CM | POA: Diagnosis not present

## 2017-01-10 DIAGNOSIS — G43009 Migraine without aura, not intractable, without status migrainosus: Secondary | ICD-10-CM

## 2017-01-10 DIAGNOSIS — G43719 Chronic migraine without aura, intractable, without status migrainosus: Secondary | ICD-10-CM | POA: Diagnosis not present

## 2017-01-10 MED ORDER — TOPIRAMATE 100 MG PO TABS: ORAL_TABLET | ORAL | 5 refills | Status: DC

## 2017-01-10 NOTE — Progress Notes (Signed)
Patient: Sophia LyonRebecca M Hall Female    DOB: 25-Sep-1980   36 y.o.   MRN: [id number] Visit Date: 01/10/2017  Today's Provider: Margaretann LovelessJennifer M Jyquan Kenley, PA-C   Chief Complaint  Patient presents with  . Follow-up   Subjective:    HPI  Patient here today to discuss restarting medication zonisamide 100 mg patient has been off medication for about 8 weeks. Patient reports medication was started by neurologist Dr. Neale [place]Freeman in [place]. Patient reports that she did advise Dr. Neale BurlyFreeman of going off medication. Patient reports that she was taking topiramate 100 mg 2 tablets before starting zonisamide. She is currently getting botox injections by Dr. Neale BurlyFreeman but noticed an improvement in her anxiety with Topamax. She is interested in restarting Topamax off label for her anxiety to prevent her from using alprazolam more frequently for her anxiety.      Allergies  Allergen Reactions  . Compazine Other (See Comments)    Dystonia  . Metoclopramide Other (See Comments)     Current Outpatient Medications:  .  ALPRAZolam (XANAX) 1 MG tablet, Take 0.5-1 tablets (0.5-1 mg total) by mouth 3 (three) times daily as needed for anxiety., Disp: 90 tablet, Rfl: 5 .  baclofen (LIORESAL) 10 MG tablet, Take 10 mg by mouth 2 (two) times daily as needed. , Disp: , Rfl: 0 .  chlorproMAZINE (THORAZINE) 25 MG tablet, chlorpromazine 25 mg tablet, Disp: , Rfl:  .  Multiple Vitamin (MULTIVITAMIN WITH MINERALS) TABS tablet, Take 1 tablet by mouth daily., Disp: , Rfl:  .  omeprazole (PRILOSEC) 40 MG capsule, omeprazole 40 mg capsule,delayed release, Disp: , Rfl:  .  promethazine (PHENERGAN) 25 MG tablet, Take 1 tablet (25 mg total) by mouth every 8 (eight) hours as needed for nausea or vomiting., Disp: 30 tablet, Rfl: 0 .  valACYclovir (VALTREX) 500 MG tablet, Take 2 tablets (1,000 mg total) by mouth 2 (two) times daily as needed (cold sores)., Disp: 20 tablet, Rfl: 5 .  topiramate (TOPAMAX) 100 MG tablet, Take one  tablet daily x 1 week and then can increase to 2 tabs PO daily, Disp: 60 tablet, Rfl: 5 .  zonisamide (ZONEGRAN) 100 MG capsule, Take 200 mg by mouth at bedtime., Disp: , Rfl: 0  Review of Systems  Constitutional: Negative.   Respiratory: Negative.   Cardiovascular: Negative.   Gastrointestinal: Negative.   Neurological: Negative.     Social History   Tobacco Use  . Smoking status: Former Smoker    Packs/day: 0.50    Years: 15.00    Pack years: 7.50    Types: Cigarettes    Start date: 11/03/1999    Last attempt to quit: 09/26/2014    Years since quitting: 2.2  . Smokeless tobacco: Never Used  Substance Use Topics  . Alcohol use: Yes    Alcohol/week: 0.0 oz    Comment: social-rare   Objective:   BP 110/86 (BP Location: Left Arm, Patient Position: Sitting, Cuff Size: Large)   Pulse 70   Temp 98.1 F (36.7 C) (Oral)   Resp 16   Wt 190 lb (86.2 kg)   SpO2 99%   BMI 29.76 kg/m  Vitals:   01/10/17 1557  BP: 110/86  Pulse: 70  Resp: 16  Temp: 98.1 F (36.7 C)  TempSrc: Oral  SpO2: 99%  Weight: 190 lb (86.2 kg)     Physical Exam  Constitutional: She appears well-developed and well-nourished. No distress.  Neck: Normal range of motion. Neck supple.  Cardiovascular: Normal rate, regular rhythm and normal heart sounds. Exam reveals no gallop and no friction rub.  No murmur heard. Pulmonary/Chest: Effort normal and breath sounds normal. No respiratory distress. She has no wheezes. She has no rales.  Skin: She is not diaphoretic.  Psychiatric: Her speech is normal and behavior is normal. Judgment and thought content normal. Her mood appears anxious. Cognition and memory are normal.  Vitals reviewed.       Assessment & Plan:     1. Migraine without aura and without status migrainosus, not intractable Will restart topamax back as below. Will have her start with one tab daily to see if this controls her symptoms well enough. If not she can increase to 2 tabs PO q hs as  she was previously taking.  - topiramate (TOPAMAX) 100 MG tablet; Take one tablet daily x 1 week and then can increase to 2 tabs PO daily  Dispense: 60 tablet; Refill: 5  2. GAD (generalized anxiety disorder) See above medical treatment plan. - topiramate (TOPAMAX) 100 MG tablet; Take one tablet daily x 1 week and then can increase to 2 tabs PO daily  Dispense: 60 tablet; Refill: 5       Sophia LovelessJennifer M Aradhya Shellenbarger, PA-C  [facility]

## 2017-01-10 NOTE — Patient Instructions (Signed)
Topiramate tablets What is this medicine? TOPIRAMATE (toe PYRE a mate) is used to treat seizures in adults or children with epilepsy. It is also used for the prevention of migraine headaches. This medicine may be used for other purposes; ask your health care provider or pharmacist if you have questions. COMMON BRAND NAME(S): Topamax, Topiragen What should I tell my health care provider before I take this medicine? They need to know if you have any of these conditions: -bleeding disorders -cirrhosis of the liver or liver disease -diarrhea -glaucoma -kidney stones or kidney disease -low blood counts, like low white cell, platelet, or red cell counts -lung disease like asthma, obstructive pulmonary disease, emphysema -metabolic acidosis -on a ketogenic diet -schedule for surgery or a procedure -suicidal thoughts, plans, or attempt; a previous suicide attempt by you or a family member -an unusual or allergic reaction to topiramate, other medicines, foods, dyes, or preservatives -pregnant or trying to get pregnant -breast-feeding How should I use this medicine? Take this medicine by mouth with a glass of water. Follow the directions on the prescription label. Do not crush or chew. You may take this medicine with meals. Take your medicine at regular intervals. Do not take it more often than directed. Talk to your pediatrician regarding the use of this medicine in children. Special care may be needed. While this drug may be prescribed for children as young as 2 years of age for selected conditions, precautions do apply. Overdosage: If you think you have taken too much of this medicine contact a poison control center or emergency room at once. NOTE: This medicine is only for you. Do not share this medicine with others. What if I miss a dose? If you miss a dose, take it as soon as you can. If your next dose is to be taken in less than 6 hours, then do not take the missed dose. Take the next dose at  your regular time. Do not take double or extra doses. What may interact with this medicine? Do not take this medicine with any of the following medications: -probenecid This medicine may also interact with the following medications: -acetazolamide -alcohol -amitriptyline -aspirin and aspirin-like medicines -birth control pills -certain medicines for depression -certain medicines for seizures -certain medicines that treat or prevent blood clots like warfarin, enoxaparin, dalteparin, apixaban, dabigatran, and rivaroxaban -digoxin -hydrochlorothiazide -lithium -medicines for pain, sleep, or muscle relaxation -metformin -methazolamide -NSAIDS, medicines for pain and inflammation, like ibuprofen or naproxen -pioglitazone -risperidone This list may not describe all possible interactions. Give your health care provider a list of all the medicines, herbs, non-prescription drugs, or dietary supplements you use. Also tell them if you smoke, drink alcohol, or use illegal drugs. Some items may interact with your medicine. What should I watch for while using this medicine? Visit your doctor or health care professional for regular checks on your progress. Do not stop taking this medicine suddenly. This increases the risk of seizures if you are using this medicine to control epilepsy. Wear a medical identification bracelet or chain to say you have epilepsy or seizures, and carry a card that lists all your medicines. This medicine can decrease sweating and increase your body temperature. Watch for signs of deceased sweating or fever, especially in children. Avoid extreme heat, hot baths, and saunas. Be careful about exercising, especially in hot weather. Contact your health care provider right away if you notice a fever or decrease in sweating. You should drink plenty of fluids while taking this medicine.   If you have had kidney stones in the past, this will help to reduce your chances of forming kidney  stones. If you have stomach pain, with nausea or vomiting and yellowing of your eyes or skin, call your doctor immediately. You may get drowsy, dizzy, or have blurred vision. Do not drive, use machinery, or do anything that needs mental alertness until you know how this medicine affects you. To reduce dizziness, do not sit or stand up quickly, especially if you are an older patient. Alcohol can increase drowsiness and dizziness. Avoid alcoholic drinks. If you notice blurred vision, eye pain, or other eye problems, seek medical attention at once for an eye exam. The use of this medicine may increase the chance of suicidal thoughts or actions. Pay special attention to how you are responding while on this medicine. Any worsening of mood, or thoughts of suicide or dying should be reported to your health care professional right away. This medicine may increase the chance of developing metabolic acidosis. If left untreated, this can cause kidney stones, bone disease, or slowed growth in children. Symptoms include breathing fast, fatigue, loss of appetite, irregular heartbeat, or loss of consciousness. Call your doctor immediately if you experience any of these side effects. Also, tell your doctor about any surgery you plan on having while taking this medicine since this may increase your risk for metabolic acidosis. Birth control pills may not work properly while you are taking this medicine. Talk to your doctor about using an extra method of birth control. Women who become pregnant while using this medicine may enroll in the [place] Antiepileptic Drug Pregnancy Registry by calling 1-888-233-2334. This registry collects information about the safety of antiepileptic drug use during pregnancy. What side effects may I notice from receiving this medicine? Side effects that you should report to your doctor or health care professional as soon as possible: -allergic reactions like skin rash, itching or hives,  swelling of the face, lips, or tongue -decreased sweating and/or rise in body temperature -depression -difficulty breathing, fast or irregular breathing patterns -difficulty speaking -difficulty walking or controlling muscle movements -hearing impairment -redness, blistering, peeling or loosening of the skin, including inside the mouth -tingling, pain or numbness in the hands or feet -unusual bleeding or bruising -unusually weak or tired -worsening of mood, thoughts or actions of suicide or dying Side effects that usually do not require medical attention (report to your doctor or health care professional if they continue or are bothersome): -altered taste -back pain, joint or muscle aches and pains -diarrhea, or constipation -headache -loss of appetite -nausea -stomach upset, indigestion -tremors This list may not describe all possible side effects. Call your doctor for medical advice about side effects. You may report side effects to FDA at 1-800-FDA-1088. Where should I keep my medicine? Keep out of the reach of children. Store at room temperature between 15 and 30 degrees C (59 and 86 degrees F) in a tightly closed container. Protect from moisture. Throw away any unused medicine after the expiration date. NOTE: This sheet is a summary. It may not cover all possible information. If you have questions about this medicine, talk to your doctor, pharmacist, or health care provider.  2018 Elsevier/Gold Standard (2013-02-15 23:17:57)  

## 2017-01-23 ENCOUNTER — Encounter: Payer: Self-pay | Admitting: Physician Assistant

## 2017-01-23 DIAGNOSIS — M542 Cervicalgia: Secondary | ICD-10-CM | POA: Diagnosis not present

## 2017-01-23 DIAGNOSIS — G518 Other disorders of facial nerve: Secondary | ICD-10-CM | POA: Diagnosis not present

## 2017-01-23 DIAGNOSIS — G43719 Chronic migraine without aura, intractable, without status migrainosus: Secondary | ICD-10-CM | POA: Diagnosis not present

## 2017-01-23 DIAGNOSIS — M791 Myalgia, unspecified site: Secondary | ICD-10-CM | POA: Diagnosis not present

## 2017-01-30 ENCOUNTER — Encounter: Payer: Self-pay | Admitting: Physician Assistant

## 2017-01-30 ENCOUNTER — Ambulatory Visit: Payer: BLUE CROSS/BLUE SHIELD | Admitting: Physician Assistant

## 2017-01-30 VITALS — BP 108/78 | HR 62 | Temp 98.0°F | Resp 16 | Ht 67.0 in | Wt 187.6 lb

## 2017-01-30 DIAGNOSIS — G8929 Other chronic pain: Secondary | ICD-10-CM | POA: Diagnosis not present

## 2017-01-30 DIAGNOSIS — M5441 Lumbago with sciatica, right side: Secondary | ICD-10-CM

## 2017-01-30 DIAGNOSIS — M5442 Lumbago with sciatica, left side: Secondary | ICD-10-CM | POA: Diagnosis not present

## 2017-01-30 MED ORDER — CYCLOBENZAPRINE HCL 5 MG PO TABS: 5.0000 mg | ORAL_TABLET | Freq: Three times a day (TID) | ORAL | 1 refills | Status: DC | PRN

## 2017-01-30 MED ORDER — METHYLPREDNISOLONE 4 MG PO TBPK: ORAL_TABLET | ORAL | 0 refills | Status: DC

## 2017-01-30 NOTE — Progress Notes (Signed)
Patient: Sophia LyonRebecca M Hall Female    DOB: 1980/05/02   36 y.o.   MRN: [id number] Visit Date: 01/30/2017  Today's Provider: Margaretann LovelessJennifer M Jasmin Trumbull, PA-C   Chief Complaint  Patient presents with  . Back Pain   Subjective:    HPI Back Pain: Patient presents for presents evaluation of low back problems.  Symptoms have been present for several years and include pain in center lower back (aching and dull in character; 7/10 in severity). Initial inciting event: Patient reports that in 2007 she fractured her left foot and has been having problems since then. Symptoms are worst: nighttime. Alleviating factors identifiable by patient are none. Exacerbating factors identifiable by patient are none. Treatments so far initiated by patient: PT Previous lower back problems: on and off since left foot fracture. Previous workup: [facility]. Previous treatments: PT, oral medications. Patient reports she has been treated in the past with oral steroid pack. Patient reports that her Ortho doctor recommended an MRI, but has not been able to have done due to other medical bills.     Allergies  Allergen Reactions  . Compazine Other (See Comments)    Dystonia  . Metoclopramide Other (See Comments)     Current Outpatient Medications:  .  ALPRAZolam (XANAX) 1 MG tablet, Take 0.5-1 tablets (0.5-1 mg total) by mouth 3 (three) times daily as needed for anxiety., Disp: 90 tablet, Rfl: 5 .  baclofen (LIORESAL) 10 MG tablet, Take 10 mg by mouth 2 (two) times daily as needed. , Disp: , Rfl: 0 .  chlorproMAZINE (THORAZINE) 25 MG tablet, chlorpromazine 25 mg tablet, Disp: , Rfl:  .  Multiple Vitamin (MULTIVITAMIN WITH MINERALS) TABS tablet, Take 1 tablet by mouth daily., Disp: , Rfl:  .  omeprazole (PRILOSEC) 40 MG capsule, omeprazole 40 mg capsule,delayed release, Disp: , Rfl:  .  promethazine (PHENERGAN) 25 MG tablet, Take 1 tablet (25 mg total) by mouth every 8 (eight) hours as needed for nausea or vomiting.,  Disp: 30 tablet, Rfl: 0 .  topiramate (TOPAMAX) 100 MG tablet, Take one tablet daily x 1 week and then can increase to 2 tabs PO daily, Disp: 60 tablet, Rfl: 5 .  valACYclovir (VALTREX) 500 MG tablet, Take 2 tablets (1,000 mg total) by mouth 2 (two) times daily as needed (cold sores)., Disp: 20 tablet, Rfl: 5  Review of Systems  HENT: Negative.   Respiratory: Negative.   Cardiovascular: Negative.   Musculoskeletal: Positive for arthralgias, back pain, gait problem and myalgias.  Neurological: Positive for weakness and numbness.    Social History   Tobacco Use  . Smoking status: Former Smoker    Packs/day: 0.50    Years: 15.00    Pack years: 7.50    Types: Cigarettes    Start date: 11/03/1999    Last attempt to quit: 09/26/2014    Years since quitting: 2.3  . Smokeless tobacco: Never Used  Substance Use Topics  . Alcohol use: Yes    Alcohol/week: 0.0 oz    Comment: social-rare   Objective:   BP 108/78 (BP Location: Left Arm, Patient Position: Sitting, Cuff Size: Large)   Pulse 62   Temp 98 F (36.7 C) (Oral)   Resp 16   Ht 5\' 7"  (1.702 m)   Wt 187 lb 9.6 oz (85.1 kg)   SpO2 98%   BMI 29.38 kg/m  Vitals:   01/30/17 0908  BP: 108/78  Pulse: 62  Resp: 16  Temp: 98 F (  36.7 C)  TempSrc: Oral  SpO2: 98%  Weight: 187 lb 9.6 oz (85.1 kg)  Height: 5\' 7"  (1.702 m)     Physical Exam  Constitutional: She appears well-developed and well-nourished. No distress.  Neck: Normal range of motion. Neck supple.  Cardiovascular: Normal rate, regular rhythm and normal heart sounds. Exam reveals no gallop and no friction rub.  No murmur heard. Pulmonary/Chest: Effort normal and breath sounds normal. No respiratory distress. She has no wheezes. She has no rales.  Musculoskeletal:       Lumbar back: She exhibits tenderness, bony tenderness, pain and spasm. She exhibits normal range of motion and no swelling.  Skin: She is not diaphoretic.  Vitals reviewed.       Assessment &  Plan:     1. Chronic bilateral low back pain with bilateral sciatica Will give Medrol dose pak and flexeril as below for inflammation and muscle spasm. Discussed possibly adding gabapentin for nerve pain if needed over time. She is to call if the symptoms worsen.  - methylPREDNISolone (MEDROL) 4 MG TBPK tablet; 6 day taper; take as directed on package instructions  Dispense: 21 tablet; Refill: 0 - cyclobenzaprine (FLEXERIL) 5 MG tablet; Take 1 tablet (5 mg total) by mouth 3 (three) times daily as needed for muscle spasms.  Dispense: 30 tablet; Refill: 1       Margaretann LovelessJennifer M Ilynn Stauffer, PA-C  [facility]

## 2017-01-30 NOTE — Patient Instructions (Signed)
Low Back Sprain Rehab  Ask your health care provider which exercises are safe for you. Do exercises exactly as told by your health care provider and adjust them as directed. It is normal to feel mild stretching, pulling, tightness, or discomfort as you do these exercises, but you should stop right away if you feel sudden pain or your pain gets worse. Do not begin these exercises until told by your health care provider.  Stretching and range of motion exercises  These exercises warm up your muscles and joints and improve the movement and flexibility of your back. These exercises also help to relieve pain, numbness, and tingling.  Exercise A: Lumbar rotation    1. Lie on your back on a firm surface and bend your knees.  2. Straighten your arms out to your sides so each arm forms an "L" shape with a side of your body (a 90 degree angle).  3. Slowly move both of your knees to one side of your body until you feel a stretch in your lower back. Try not to let your shoulders move off of the floor.  4. Hold for __________ seconds.  5. Tense your abdominal muscles and slowly move your knees back to the starting position.  6. Repeat this exercise on the other side of your body.  Repeat __________ times. Complete this exercise __________ times a day.  Exercise B: Prone extension on elbows    1. Lie on your abdomen on a firm surface.  2. Prop yourself up on your elbows.  3. Use your arms to help lift your chest up until you feel a gentle stretch in your abdomen and your lower back.  ? This will place some of your body weight on your elbows. If this is uncomfortable, try stacking pillows under your chest.  ? Your hips should stay down, against the surface that you are lying on. Keep your hip and back muscles relaxed.  4. Hold for __________ seconds.  5. Slowly relax your upper body and return to the starting position.  Repeat __________ times. Complete this exercise __________ times a day.  Strengthening exercises  These  exercises build strength and endurance in your back. Endurance is the ability to use your muscles for a long time, even after they get tired.  Exercise C: Pelvic tilt  1. Lie on your back on a firm surface. Bend your knees and keep your feet flat.  2. Tense your abdominal muscles. Tip your pelvis up toward the ceiling and flatten your lower back into the floor.  ? To help with this exercise, you may place a small towel under your lower back and try to push your back into the towel.  3. Hold for __________ seconds.  4. Let your muscles relax completely before you repeat this exercise.  Repeat __________ times. Complete this exercise __________ times a day.  Exercise D: Alternating arm and leg raises    1. Get on your hands and knees on a firm surface. If you are on a hard floor, you may want to use padding to cushion your knees, such as an exercise mat.  2. Line up your arms and legs. Your hands should be below your shoulders, and your knees should be below your hips.  3. Lift your left leg behind you. At the same time, raise your right arm and straighten it in front of you.  ? Do not lift your leg higher than your hip.  ? Do not lift your arm   higher than your shoulder.  ? Keep your abdominal and back muscles tight.  ? Keep your hips facing the ground.  ? Do not arch your back.  ? Keep your balance carefully, and do not hold your breath.  4. Hold for __________ seconds.  5. Slowly return to the starting position and repeat with your right leg and your left arm.  Repeat __________ times. Complete this exercise __________ times a day.  Exercise E: Abdominal set with straight leg raise    1. Lie on your back on a firm surface.  2. Bend one of your knees and keep your other leg straight.  3. Tense your abdominal muscles and lift your straight leg up, 4-6 inches (10-15 cm) off the ground.  4. Keep your abdominal muscles tight and hold for __________ seconds.  ? Do not hold your breath.  ? Do not arch your back. Keep it  flat against the ground.  5. Keep your abdominal muscles tense as you slowly lower your leg back to the starting position.  6. Repeat with your other leg.  Repeat __________ times. Complete this exercise __________ times a day.  Posture and body mechanics    Body mechanics refers to the movements and positions of your body while you do your daily activities. Posture is part of body mechanics. Good posture and healthy body mechanics can help to relieve stress in your body's tissues and joints. Good posture means that your spine is in its natural S-curve position (your spine is neutral), your shoulders are pulled back slightly, and your head is not tipped forward. The following are general guidelines for applying improved posture and body mechanics to your everyday activities.  Standing    · When standing, keep your spine neutral and your feet about hip-width apart. Keep a slight bend in your knees. Your ears, shoulders, and hips should line up.  · When you do a task in which you stand in one place for a long time, place one foot up on a stable object that is 2-4 inches (5-10 cm) high, such as a footstool. This helps keep your spine neutral.  Sitting    · When sitting, keep your spine neutral and keep your feet flat on the floor. Use a footrest, if necessary, and keep your thighs parallel to the floor. Avoid rounding your shoulders, and avoid tilting your head forward.  · When working at a desk or a computer, keep your desk at a height where your hands are slightly lower than your elbows. Slide your chair under your desk so you are close enough to maintain good posture.  · When working at a computer, place your monitor at a height where you are looking straight ahead and you do not have to tilt your head forward or downward to look at the screen.  Resting    · When lying down and resting, avoid positions that are most painful for you.  · If you have pain with activities such as sitting, bending, stooping, or squatting  (flexion-based activities), lie in a position in which your body does not bend very much. For example, avoid curling up on your side with your arms and knees near your chest (fetal position).  · If you have pain with activities such as standing for a long time or reaching with your arms (extension-based activities), lie with your spine in a neutral position and bend your knees slightly. Try the following positions:  · Lying on your side with a   pillow between your knees.  · Lying on your back with a pillow under your knees.  Lifting    · When lifting objects, keep your feet at least shoulder-width apart and tighten your abdominal muscles.  · Bend your knees and hips and keep your spine neutral. It is important to lift using the strength of your legs, not your back. Do not lock your knees straight out.  · Always ask for help to lift heavy or awkward objects.  This information is not intended to replace advice given to you by your health care provider. Make sure you discuss any questions you have with your health care provider.  Document Released: 02/11/2005 Document Revised: 10/19/2015 Document Reviewed: 11/23/2014  Elsevier Interactive Patient Education © 2018 [employer]

## 2017-03-05 ENCOUNTER — Encounter: Payer: Self-pay | Admitting: Physician Assistant

## 2017-03-05 DIAGNOSIS — F5101 Primary insomnia: Secondary | ICD-10-CM

## 2017-03-05 DIAGNOSIS — F411 Generalized anxiety disorder: Secondary | ICD-10-CM

## 2017-03-06 ENCOUNTER — Telehealth: Payer: Self-pay

## 2017-03-06 MED ORDER — ALPRAZOLAM 1 MG PO TABS: 0.5000 mg | ORAL_TABLET | Freq: Three times a day (TID) | ORAL | 5 refills | Status: DC | PRN

## 2017-03-06 NOTE — Telephone Encounter (Signed)
[facility] [address] Patient advised.  Thanks,  -Joseline

## 2017-03-07 ENCOUNTER — Encounter: Payer: Self-pay | Admitting: Physician Assistant

## 2017-03-07 DIAGNOSIS — G43719 Chronic migraine without aura, intractable, without status migrainosus: Secondary | ICD-10-CM | POA: Diagnosis not present

## 2017-03-07 DIAGNOSIS — M542 Cervicalgia: Secondary | ICD-10-CM | POA: Diagnosis not present

## 2017-03-07 DIAGNOSIS — M5442 Lumbago with sciatica, left side: Principal | ICD-10-CM

## 2017-03-07 DIAGNOSIS — M791 Myalgia, unspecified site: Secondary | ICD-10-CM | POA: Diagnosis not present

## 2017-03-07 DIAGNOSIS — G518 Other disorders of facial nerve: Secondary | ICD-10-CM | POA: Diagnosis not present

## 2017-03-07 DIAGNOSIS — M5441 Lumbago with sciatica, right side: Principal | ICD-10-CM

## 2017-03-07 DIAGNOSIS — G8929 Other chronic pain: Secondary | ICD-10-CM

## 2017-03-07 MED ORDER — GABAPENTIN 100 MG PO CAPS: ORAL_CAPSULE | ORAL | 0 refills | Status: DC

## 2017-04-01 ENCOUNTER — Other Ambulatory Visit: Payer: Self-pay | Admitting: Physician Assistant

## 2017-04-01 DIAGNOSIS — M5442 Lumbago with sciatica, left side: Principal | ICD-10-CM

## 2017-04-01 DIAGNOSIS — M5441 Lumbago with sciatica, right side: Principal | ICD-10-CM

## 2017-04-01 DIAGNOSIS — G8929 Other chronic pain: Secondary | ICD-10-CM

## 2017-04-01 NOTE — Telephone Encounter (Signed)
Can we see which dose she is taking for refill?  Thanks.

## 2017-04-01 NOTE — Telephone Encounter (Signed)
Please Review

## 2017-04-01 NOTE — Telephone Encounter (Signed)
Patient reports she is on gabapentin 300 mg daily. Patient reports that she did not request refill. Patient reports that she will message provider on how she is doing on this dose on Friday.

## 2017-04-01 NOTE — Telephone Encounter (Signed)
Noted  

## 2017-04-01 NOTE — Telephone Encounter (Signed)
[facility] for a 90-days supply for the following medication. Thanks CC ° °gabapentin (NEURONTIN) 100 MG capsule  ° °

## 2017-04-04 ENCOUNTER — Encounter: Payer: Self-pay | Admitting: Physician Assistant

## 2017-04-04 DIAGNOSIS — G629 Polyneuropathy, unspecified: Secondary | ICD-10-CM

## 2017-04-04 MED ORDER — GABAPENTIN 300 MG PO CAPS: 300.0000 mg | ORAL_CAPSULE | Freq: Every day | ORAL | 3 refills | Status: DC

## 2017-04-21 DIAGNOSIS — G43719 Chronic migraine without aura, intractable, without status migrainosus: Secondary | ICD-10-CM | POA: Diagnosis not present

## 2017-04-24 DIAGNOSIS — M791 Myalgia, unspecified site: Secondary | ICD-10-CM | POA: Diagnosis not present

## 2017-04-24 DIAGNOSIS — M542 Cervicalgia: Secondary | ICD-10-CM | POA: Diagnosis not present

## 2017-04-24 DIAGNOSIS — G43719 Chronic migraine without aura, intractable, without status migrainosus: Secondary | ICD-10-CM | POA: Diagnosis not present

## 2017-04-24 DIAGNOSIS — G518 Other disorders of facial nerve: Secondary | ICD-10-CM | POA: Diagnosis not present

## 2017-06-10 DIAGNOSIS — G518 Other disorders of facial nerve: Secondary | ICD-10-CM | POA: Diagnosis not present

## 2017-06-10 DIAGNOSIS — M542 Cervicalgia: Secondary | ICD-10-CM | POA: Diagnosis not present

## 2017-06-10 DIAGNOSIS — G43719 Chronic migraine without aura, intractable, without status migrainosus: Secondary | ICD-10-CM | POA: Diagnosis not present

## 2017-06-10 DIAGNOSIS — M791 Myalgia, unspecified site: Secondary | ICD-10-CM | POA: Diagnosis not present

## 2017-07-02 ENCOUNTER — Other Ambulatory Visit: Payer: Self-pay | Admitting: Physician Assistant

## 2017-07-14 DIAGNOSIS — G43719 Chronic migraine without aura, intractable, without status migrainosus: Secondary | ICD-10-CM | POA: Diagnosis not present

## 2017-07-18 ENCOUNTER — Ambulatory Visit
Admission: RE | Admit: 2017-07-18 | Discharge: 2017-07-18 | Disposition: A | Discharge status: Home / Self Care | Payer: BLUE CROSS/BLUE SHIELD | Source: Ambulatory Visit | Attending: Physician Assistant | Admitting: Physician Assistant

## 2017-07-18 ENCOUNTER — Ambulatory Visit: Payer: BLUE CROSS/BLUE SHIELD | Admitting: Physician Assistant

## 2017-07-18 ENCOUNTER — Encounter: Payer: Self-pay | Admitting: Physician Assistant

## 2017-07-18 VITALS — BP 102/64 | HR 72 | Temp 97.8°F | Resp 16 | Wt 189.0 lb

## 2017-07-18 DIAGNOSIS — R1032 Left lower quadrant pain: Secondary | ICD-10-CM | POA: Insufficient documentation

## 2017-07-18 DIAGNOSIS — Z87442 Personal history of urinary calculi: Secondary | ICD-10-CM | POA: Diagnosis not present

## 2017-07-18 DIAGNOSIS — N200 Calculus of kidney: Secondary | ICD-10-CM | POA: Insufficient documentation

## 2017-07-18 MED ORDER — OXYCODONE-ACETAMINOPHEN 5-325 MG PO TABS: 1.0000 | ORAL_TABLET | Freq: Four times a day (QID) | ORAL | 0 refills | Status: DC | PRN

## 2017-07-18 MED ORDER — SULFAMETHOXAZOLE-TRIMETHOPRIM 800-160 MG PO TABS: 1.0000 | ORAL_TABLET | Freq: Two times a day (BID) | ORAL | 0 refills | Status: AC

## 2017-07-18 MED ORDER — TAMSULOSIN HCL 0.4 MG PO CAPS: 0.4000 mg | ORAL_CAPSULE | Freq: Every day | ORAL | 0 refills | Status: DC

## 2017-07-18 NOTE — Progress Notes (Addendum)
Patient: [name] Female    DOB: 1980/11/25   37 y.o.   MRN: [id number] Visit Date: 07/18/2017  Today's Provider: Trey Sailors, PA-C   Chief Complaint  Patient presents with  . Abdominal Pain    Lower left side started about four days ago.    Subjective:    [name] is a 37 y/o female with extensive history of nephrolithiasis and hysterectomy presenting today with LLQ pain. She reports she has had this pain for 4-5 days and it is constant and worsening. She reports a history of kidney stones typically on her left side. At times, she has had pyelonpehritis from this. She has had a hysterectomy and does not have a cervix. She does have both her ovaries and at times has had ovarian cysts. She denies vaginal discharge, bleeding, fever, diarrhea. She denies vomiting but is experiencing some nausea.   Abdominal Pain  This is a new problem. The current episode started in the past 7 days. The problem occurs constantly. The problem has been gradually worsening. The pain is located in the LLQ. The pain is at a severity of 7/10. The quality of the pain is aching, dull and tearing. The abdominal pain does not radiate. Pertinent negatives include no constipation, diarrhea, fever, nausea or vomiting.       Allergies  Allergen Reactions  . Compazine Other (See Comments)    Dystonia  . Metoclopramide Other (See Comments)     Current Outpatient Medications:  .  ALPRAZolam (XANAX) 1 MG tablet, Take 0.5-1 tablets (0.5-1 mg total) by mouth 3 (three) times daily as needed for anxiety., Disp: 90 tablet, Rfl: 5 .  baclofen (LIORESAL) 10 MG tablet, Take 10 mg by mouth 2 (two) times daily as needed. , Disp: , Rfl: 0 .  chlorproMAZINE (THORAZINE) 25 MG tablet, chlorpromazine 25 mg tablet, Disp: , Rfl:  .  cyclobenzaprine (FLEXERIL) 5 MG tablet, Take 1 tablet (5 mg total) by mouth 3 (three) times daily as needed for muscle spasms., Disp: 30 tablet, Rfl: 1 .  gabapentin  (NEURONTIN) 300 MG capsule, Take 1 capsule (300 mg total) by mouth at bedtime., Disp: 90 capsule, Rfl: 3 .  Multiple Vitamin (MULTIVITAMIN WITH MINERALS) TABS tablet, Take 1 tablet by mouth daily., Disp: , Rfl:  .  omeprazole (PRILOSEC) 20 MG capsule, TAKE 1 CAPSULE (20 MG TOTAL) BY MOUTH DAILY., Disp: 30 capsule, Rfl: 2 .  promethazine (PHENERGAN) 25 MG tablet, Take 1 tablet (25 mg total) by mouth every 8 (eight) hours as needed for nausea or vomiting., Disp: 30 tablet, Rfl: 0 .  topiramate (TOPAMAX) 100 MG tablet, Take one tablet daily x 1 week and then can increase to 2 tabs PO daily, Disp: 60 tablet, Rfl: 5 .  valACYclovir (VALTREX) 500 MG tablet, Take 2 tablets (1,000 mg total) by mouth 2 (two) times daily as needed (cold sores)., Disp: 20 tablet, Rfl: 5 .  methylPREDNISolone (MEDROL) 4 MG TBPK tablet, 6 day taper; take as directed on package instructions, Disp: 21 tablet, Rfl: 0 .  omeprazole (PRILOSEC) 40 MG capsule, omeprazole 40 mg capsule,delayed release, Disp: , Rfl:  .  oxyCODONE-acetaminophen (PERCOCET/ROXICET) 5-325 MG tablet, Take 1 tablet by mouth every 6 (six) hours as needed for severe pain., Disp: 20 tablet, Rfl: 0 .  sulfamethoxazole-trimethoprim (BACTRIM DS) 800-160 MG tablet, Take 1 tablet by mouth 2 (two) times daily for 7 days., Disp: 14 tablet, Rfl: 0 .  tamsulosin (FLOMAX) 0.4  MG CAPS capsule, Take 1 capsule (0.4 mg total) by mouth daily., Disp: 30 capsule, Rfl: 0  Review of Systems  Constitutional: Positive for fatigue. Negative for activity change, appetite change, chills, diaphoresis, fever and unexpected weight change.  Gastrointestinal: Positive for abdominal pain. Negative for abdominal distention, anal bleeding, blood in stool, constipation, diarrhea, nausea, rectal pain and vomiting.  Genitourinary: Negative.    Family History  Problem Relation Age of Onset  . Diabetes Mother   . Diabetes Father   . Heart failure Father   . Hypertension Father   . Pulmonary  fibrosis Father   . Nephrolithiasis Father   . Kidney cancer Neg Hx   . Bladder Cancer Neg Hx    Past Surgical History:  Procedure Laterality Date  . ABDOMINAL HYSTERECTOMY  2006  . ANKLE ARTHROSCOPY Left   . ANKLE ARTHROSCOPY Left 11/24/2015  . APPENDECTOMY  07/26/13  . CAST APPLICATION  12/25/2011   Procedure: CAST APPLICATION;  Surgeon: Javier Docker, MD;  Location: [facility];  Service: Orthopedics;  Laterality: Left;  . CHOLECYSTECTOMY  03/11/2011   Procedure: LAPAROSCOPIC CHOLECYSTECTOMY WITH INTRAOPERATIVE CHOLANGIOGRAM;  Surgeon: Atilano Ina, MD;  Location: [facility];  Service: General;  Laterality: N/A;  . CHOLECYSTECTOMY, LAPAROSCOPIC    . CYSTOSCOPY/URETEROSCOPY/HOLMIUM LASER/STENT PLACEMENT Left 06/18/2016   Procedure: CYSTOSCOPY/URETEROSCOPY/HOLMIUM LASER/STENT PLACEMENT;  Surgeon: Vanna Scotland, MD;  Location: [facility] ORS;  Service: Urology;  Laterality: Left;  . LITHOTRIPSY     x 4  . ORIF ANKLE FRACTURE  12/25/2011   Procedure: OPEN REDUCTION INTERNAL FIXATION (ORIF) ANKLE FRACTURE;  Surgeon: Javier Docker, MD;  Location: [facility];  Service: Orthopedics;  Laterality: Left;  . TONSILLECTOMY       Social History   Tobacco Use  . Smoking status: Former Smoker    Packs/day: 0.50    Years: 15.00    Pack years: 7.50    Types: Cigarettes    Start date: 11/03/1999    Last attempt to quit: 09/26/2014    Years since quitting: 2.8  . Smokeless tobacco: Never Used  Substance Use Topics  . Alcohol use: Yes    Alcohol/week: 0.0 oz    Comment: social-rare   Objective:   BP 102/64 (BP Location: Right Arm, Patient Position: Sitting, Cuff Size: Large)   Pulse 72   Temp 97.8 F (36.6 C) (Oral)   Resp 16   Wt 189 lb (85.7 kg)   BMI 29.60 kg/m  Vitals:   07/18/17 1154  BP: 102/64  Pulse: 72  Resp: 16  Temp: 97.8 F (36.6 C)  TempSrc: Oral  Weight: 189 lb (85.7 kg)     Physical Exam  Constitutional: She is oriented to person, place, and time. She appears well-developed  and well-nourished.  Cardiovascular: Normal rate, regular rhythm and normal heart sounds.  Pulmonary/Chest: Effort normal and breath sounds normal.  Abdominal: Soft. Normal appearance and bowel sounds are normal. There is no tenderness. There is no CVA tenderness.  Neurological: She is alert and oriented to person, place, and time.  Skin: Skin is warm and dry.  Psychiatric: She has a normal mood and affect. Her behavior is normal.        Assessment & Plan:     1. LLQ pain  There is trace blood in her UA but it is otherwise unremarkable. Will send for cx. Will get KUB to try and image ureteral stone. Treat as kidney stone. Pain medication and flomax as below. Will send home hard script of  bactrim if infectious symptoms emerge over the weekend. She has had a hysterectomy but retains her ovaries. There may be a possibility of an ovarian cyst and a very slightly possibility of ovarian torsion. However, if it is an ovarian cyst, the treatment is expectant. I have a low suspicion of ovarian torsion due to prior hysterectomy and length of symptoms.   - oxyCODONE-acetaminophen (PERCOCET/ROXICET) 5-325 MG tablet; Take 1 tablet by mouth every 6 (six) hours as needed for severe pain.  Dispense: 20 tablet; Refill: 0 - tamsulosin (FLOMAX) 0.4 MG CAPS capsule; Take 1 capsule (0.4 mg total) by mouth daily.  Dispense: 30 capsule; Refill: 0 - sulfamethoxazole-trimethoprim (BACTRIM DS) 800-160 MG tablet; Take 1 tablet by mouth 2 (two) times daily for 7 days.  Dispense: 14 tablet; Refill: 0 - DG Abd 1 View; Future  2. Nephrolithiasis  - oxyCODONE-acetaminophen (PERCOCET/ROXICET) 5-325 MG tablet; Take 1 tablet by mouth every 6 (six) hours as needed for severe pain.  Dispense: 20 tablet; Refill: 0 - tamsulosin (FLOMAX) 0.4 MG CAPS capsule; Take 1 capsule (0.4 mg total) by mouth daily.  Dispense: 30 capsule; Refill: 0 - sulfamethoxazole-trimethoprim (BACTRIM DS) 800-160 MG tablet; Take 1 tablet by mouth 2 (two)  times daily for 7 days.  Dispense: 14 tablet; Refill: 0 - DG Abd 1 View; Future  Return if symptoms worsen or fail to improve.  The entirety of the information documented in the History of Present Illness, Review of Systems and Physical Exam were personally obtained by me. Portions of this information were initially documented by Kavin Leech, CMA and reviewed by me for thoroughness and accuracy.         Trey Sailors, PA-C  [facility]

## 2017-07-18 NOTE — Patient Instructions (Signed)

## 2017-07-22 ENCOUNTER — Telehealth: Payer: Self-pay

## 2017-07-22 NOTE — Telephone Encounter (Signed)
Pt advised. She reports she is feeling much better.   Thanks,   -Laura  

## 2017-07-22 NOTE — Telephone Encounter (Signed)
-----   Message from Trey Sailors, [place] sent at 07/18/2017  4:30 PM EDT ----- No ureteral stone visualized. Still bilateral kidney stones. Xray is not as sensitive as CT, so there could potentially be a stone. Please have her follow up if not improving.

## 2017-07-22 NOTE — Telephone Encounter (Signed)
LMTCB 07/22/2017  Thanks,   -Jocsan Mcginley  

## 2017-07-23 ENCOUNTER — Encounter: Payer: Self-pay | Admitting: Physician Assistant

## 2017-07-23 DIAGNOSIS — R1032 Left lower quadrant pain: Secondary | ICD-10-CM

## 2017-07-24 ENCOUNTER — Encounter: Payer: Self-pay | Admitting: Physician Assistant

## 2017-07-24 DIAGNOSIS — G43719 Chronic migraine without aura, intractable, without status migrainosus: Secondary | ICD-10-CM | POA: Diagnosis not present

## 2017-07-24 DIAGNOSIS — M791 Myalgia, unspecified site: Secondary | ICD-10-CM | POA: Diagnosis not present

## 2017-07-24 DIAGNOSIS — M542 Cervicalgia: Secondary | ICD-10-CM | POA: Diagnosis not present

## 2017-07-24 DIAGNOSIS — G518 Other disorders of facial nerve: Secondary | ICD-10-CM | POA: Diagnosis not present

## 2017-07-25 ENCOUNTER — Encounter (HOSPITAL_COMMUNITY): Payer: Self-pay

## 2017-07-25 ENCOUNTER — Emergency Department (HOSPITAL_COMMUNITY): Payer: BLUE CROSS/BLUE SHIELD

## 2017-07-25 ENCOUNTER — Emergency Department (HOSPITAL_COMMUNITY)
Admission: EM | Admit: 2017-07-25 | Discharge: 2017-07-25 | Disposition: A | Discharge status: Home / Self Care | Payer: BLUE CROSS/BLUE SHIELD | Attending: Emergency Medicine | Admitting: Emergency Medicine

## 2017-07-25 ENCOUNTER — Other Ambulatory Visit: Payer: Self-pay

## 2017-07-25 DIAGNOSIS — N200 Calculus of kidney: Secondary | ICD-10-CM | POA: Diagnosis not present

## 2017-07-25 DIAGNOSIS — J45909 Unspecified asthma, uncomplicated: Secondary | ICD-10-CM | POA: Insufficient documentation

## 2017-07-25 DIAGNOSIS — Z87891 Personal history of nicotine dependence: Secondary | ICD-10-CM | POA: Diagnosis not present

## 2017-07-25 DIAGNOSIS — N23 Unspecified renal colic: Secondary | ICD-10-CM | POA: Diagnosis not present

## 2017-07-25 DIAGNOSIS — Z79899 Other long term (current) drug therapy: Secondary | ICD-10-CM | POA: Insufficient documentation

## 2017-07-25 DIAGNOSIS — R1032 Left lower quadrant pain: Secondary | ICD-10-CM | POA: Diagnosis not present

## 2017-07-25 LAB — COMPREHENSIVE METABOLIC PANEL
ALT: 24 U/L (ref 14–54)
AST: 26 U/L (ref 15–41)
Albumin: 4.1 g/dL (ref 3.5–5.0)
Alkaline Phosphatase: 59 U/L (ref 38–126)
Anion gap: 11 (ref 5–15)
BUN: 10 mg/dL (ref 6–20)
CO2: 21 mmol/L — ABNORMAL LOW (ref 22–32)
Calcium: 9.6 mg/dL (ref 8.9–10.3)
Chloride: 104 mmol/L (ref 101–111)
Creatinine, Ser: 0.83 mg/dL (ref 0.44–1.00)
GFR calc Af Amer: >60 mL/min (ref >60)
GFR calc non Af Amer: >60 mL/min (ref >60)
Glucose, Bld: 96 mg/dL (ref 65–99)
Potassium: 3.7 mmol/L (ref 3.5–5.1)
Sodium: 136 mmol/L (ref 135–145)
Total Bilirubin: 0.6 mg/dL (ref 0.3–1.2)
Total Protein: 7.2 g/dL (ref 6.5–8.1)

## 2017-07-25 LAB — CBC
HCT: 43.2 % (ref 36.0–46.0)
Hemoglobin: 14 g/dL (ref 12.0–15.0)
MCH: 30.3 pg (ref 26.0–34.0)
MCHC: 32.4 g/dL (ref 30.0–36.0)
MCV: 93.5 fL (ref 78.0–100.0)
Platelets: 225 10*3/uL (ref 150–400)
RBC: 4.62 MIL/uL (ref 3.87–5.11)
RDW: 12.2 % (ref 11.5–15.5)
WBC: 8.3 10*3/uL (ref 4.0–10.5)

## 2017-07-25 LAB — URINALYSIS, ROUTINE W REFLEX MICROSCOPIC
Bilirubin Urine: NEGATIVE
Glucose, UA: NEGATIVE mg/dL
Ketones, ur: NEGATIVE mg/dL
Leukocytes, UA: NEGATIVE
Nitrite: NEGATIVE
Protein, ur: NEGATIVE mg/dL
Specific Gravity, Urine: 1.015 (ref 1.005–1.030)
pH: 7 (ref 5.0–8.0)

## 2017-07-25 LAB — LIPASE, BLOOD: Lipase: 29 U/L (ref 11–51)

## 2017-07-25 MED ORDER — ONDANSETRON HCL 4 MG/2ML IJ SOLN
4.0000 mg | Freq: Once | INTRAMUSCULAR | Status: AC
  Administered 2017-07-25: 4 mg via INTRAVENOUS
  Filled 2017-07-25: qty 2

## 2017-07-25 MED ORDER — SODIUM CHLORIDE 0.9 % IV BOLUS
1000.0000 mL | Freq: Once | INTRAVENOUS | Status: AC
  Administered 2017-07-25: 1000 mL via INTRAVENOUS

## 2017-07-25 MED ORDER — KETOROLAC TROMETHAMINE 30 MG/ML IJ SOLN
30.0000 mg | Freq: Once | INTRAMUSCULAR | Status: AC
  Administered 2017-07-25: 30 mg via INTRAVENOUS
  Filled 2017-07-25: qty 1

## 2017-07-25 MED ORDER — OXYCODONE-ACETAMINOPHEN 5-325 MG PO TABS: 1.0000 | ORAL_TABLET | Freq: Four times a day (QID) | ORAL | 0 refills | Status: DC | PRN

## 2017-07-25 MED ORDER — MORPHINE SULFATE (PF) 4 MG/ML IV SOLN
4.0000 mg | Freq: Once | INTRAVENOUS | Status: AC
  Administered 2017-07-25: 4 mg via INTRAVENOUS
  Filled 2017-07-25: qty 1

## 2017-07-25 NOTE — ED Provider Notes (Signed)
[facility] EMERGENCY DEPARTMENT Provider Note   CSN: [id number] Arrival date & time: 07/25/17  0704     History   Chief Complaint No chief complaint on file.   HPI [name] is a 37 y.o. female.  HPI Patient presents to the emergency department with left lower abdomen and flank pain.  Patient states that she has a long history of renal stones.  The patient states that she has a urologist that she follows with.  Patient states she is due to see them on June 14.  She states this pain started last Friday.  She saw her primary doctor who prescribed medications and did a KUB.  The patient states that nothing seems to make the condition better or worse.  The patient denies chest pain, shortness of breath, headache,blurred vision, neck pain, fever, cough, weakness, numbness, dizziness, anorexia, edema,  vomiting, diarrhea, rash, back pain, dysuria, hematemesis, bloody stool, near syncope, or syncope. Past Medical History:  Diagnosis Date  . Acute pyelonephritis 06/29/2012  . Anxiety   . Asthma    AS A CHILD  . Complication of anesthesia   . Gallstones   . GERD (gastroesophageal reflux disease)   . History of kidney stones   . Kidney stones    bilateral  . Migraine   . Pelvic pain in female   . PONV (postoperative nausea and vomiting)    WITH HYSTERECTOMY ONLY  . Pyelonephritis 2015  . Renal mass    right  . Tobacco abuse     Patient Active Problem List   Diagnosis Date Noted  . IBS (irritable bowel syndrome) 02/02/2015  . Herpes simplex 02/02/2015  . Asthma, mild intermittent 11/03/2014  . Migraine without aura or status migrainosus 11/03/2014  . Gastroesophageal reflux disease without esophagitis 11/03/2014  . Generalized anxiety disorder 11/03/2014  . Other and unspecified ovarian cyst 08/06/2013  . Appendicolith 07/16/2013  . Chronic constipation 06/30/2012  . History of pyelonephritis 06/29/2012  . Nephrolithiasis 06/28/2012    Past  Surgical History:  Procedure Laterality Date  . ABDOMINAL HYSTERECTOMY  2006  . ANKLE ARTHROSCOPY Left   . ANKLE ARTHROSCOPY Left 11/24/2015  . APPENDECTOMY  07/26/13  . CAST APPLICATION  12/25/2011   Procedure: CAST APPLICATION;  Surgeon: Javier Docker, MD;  Location: [facility] ORS;  Service: Orthopedics;  Laterality: Left;  . CHOLECYSTECTOMY  03/11/2011   Procedure: LAPAROSCOPIC CHOLECYSTECTOMY WITH INTRAOPERATIVE CHOLANGIOGRAM;  Surgeon: Atilano Ina, MD;  Location: [facility] OR;  Service: General;  Laterality: N/A;  . CHOLECYSTECTOMY, LAPAROSCOPIC    . CYSTOSCOPY/URETEROSCOPY/HOLMIUM LASER/STENT PLACEMENT Left 06/18/2016   Procedure: CYSTOSCOPY/URETEROSCOPY/HOLMIUM LASER/STENT PLACEMENT;  Surgeon: Vanna Scotland, MD;  Location: [facility] ORS;  Service: Urology;  Laterality: Left;  . LITHOTRIPSY     x 4  . ORIF ANKLE FRACTURE  12/25/2011   Procedure: OPEN REDUCTION INTERNAL FIXATION (ORIF) ANKLE FRACTURE;  Surgeon: Javier Docker, MD;  Location: [facility] ORS;  Service: Orthopedics;  Laterality: Left;  . TONSILLECTOMY       OB History    Gravida  2   Para  2   Term      Preterm      AB      Living  2     SAB      TAB      Ectopic      Multiple      Live Births           Obstetric Comments  1st Menstrual Cycle:  10 1st Pregnancy:  17         Home Medications    Prior to Admission medications   Medication Sig Start Date End Date Taking? Authorizing Provider  ALPRAZolam Prudy Feeler) 1 MG tablet Take 0.5-1 tablets (0.5-1 mg total) by mouth 3 (three) times daily as needed for anxiety. 03/06/17  Yes [name], PA-C  baclofen (LIORESAL) 10 MG tablet Take 10 mg by mouth 2 (two) times daily as needed.  05/15/16  Yes [provider]  chlorproMAZINE (THORAZINE) 25 MG tablet take  by mouth as needed for migraines   Yes [provider]  gabapentin (NEURONTIN) 300 MG capsule Take 1 capsule (300 mg total) by mouth at bedtime. 04/04/17  Yes [name], PA-C    Multiple Vitamin (MULTIVITAMIN WITH MINERALS) TABS tablet Take 1 tablet by mouth daily.   Yes [provider]  omeprazole (PRILOSEC) 20 MG capsule TAKE 1 CAPSULE (20 MG TOTAL) BY MOUTH DAILY. 07/02/17  Yes [name], PA-C  OnabotulinumtoxinA (BOTOX IJ) Inject 1 each as directed every 3 (three) months. Alternating with trigger point inj   Yes [provider]  oxyCODONE-acetaminophen (PERCOCET/ROXICET) 5-325 MG tablet Take 1 tablet by mouth every 6 (six) hours as needed for severe pain. 07/18/17  Yes Trey Sailors, PA-C  promethazine (PHENERGAN) 25 MG tablet Take 1 tablet (25 mg total) by mouth every 8 (eight) hours as needed for nausea or vomiting. 06/17/16  Yes Enid Baas, MD  tamsulosin (FLOMAX) 0.4 MG CAPS capsule Take 1 capsule (0.4 mg total) by mouth daily. 07/18/17  Yes Osvaldo Angst M, PA-C  topiramate (TOPAMAX) 100 MG tablet Take one tablet daily x 1 week and then can increase to 2 tabs PO daily Patient taking differently: Take 100 mg by mouth daily.  01/10/17  Yes Burnette, Alessandra Bevels, PA-C  UNABLE TO FIND Inject 1 each as directed every 3 (three) months. lidocaine mixed w/ steroid Treatment for migraines(trigger point inj) Alternating with botox inj   Yes [provider]  valACYclovir (VALTREX) 500 MG tablet Take 2 tablets (1,000 mg total) by mouth 2 (two) times daily as needed (cold sores). 10/08/16  Yes [name], PA-C  cyclobenzaprine (FLEXERIL) 5 MG tablet Take 1 tablet (5 mg total) by mouth 3 (three) times daily as needed for muscle spasms. Patient not taking: Reported on 07/25/2017 01/30/17   [name], PA-C  sulfamethoxazole-trimethoprim (BACTRIM DS) 800-160 MG tablet Take 1 tablet by mouth 2 (two) times daily for 7 days. Patient not taking: Reported on 07/25/2017 07/18/17 07/25/17  Trey Sailors, PA-C    Family History Family History  Problem Relation Age of Onset  . Diabetes Mother   . Diabetes Father   .  Heart failure Father   . Hypertension Father   . Pulmonary fibrosis Father   . Nephrolithiasis Father   . Kidney cancer Neg Hx   . Bladder Cancer Neg Hx     Social History Social History   Tobacco Use  . Smoking status: Former Smoker    Packs/day: 0.50    Years: 15.00    Pack years: 7.50    Types: Cigarettes    Start date: 11/03/1999    Last attempt to quit: 09/26/2014    Years since quitting: 2.8  . Smokeless tobacco: Never Used  Substance Use Topics  . Alcohol use: Yes    Alcohol/week: 0.0 oz    Comment: social-rare  . Drug use: No     Allergies  Compazine and Metoclopramide   Review of Systems Review of Systems All other systems negative except as documented in the HPI. All pertinent positives and negatives as reviewed in the HPI. Physical Exam Updated Vital Signs BP 93/77   Pulse 68   Temp 98.2 F (36.8 C) (Oral)   Resp 15   Ht  (1.702 m)   Wt 86.2 kg (190 lb)   SpO2 100%   BMI 29.76 kg/m   Physical Exam  Constitutional: She is oriented to person, place, and time. She appears well-developed and well-nourished. No distress.  HENT:  Head: Normocephalic and atraumatic.  Mouth/Throat: Oropharynx is clear and moist.  Eyes: Pupils are equal, round, and reactive to light.  Neck: Normal range of motion. Neck supple.  Cardiovascular: Normal rate, regular rhythm and normal heart sounds. Exam reveals no gallop and no friction rub.  No murmur heard. Pulmonary/Chest: Effort normal and breath sounds normal. No respiratory distress. She has no wheezes.  Abdominal: Soft. Bowel sounds are normal. She exhibits no distension and no mass. There is no tenderness. There is no rebound and no guarding.  Neurological: She is alert and oriented to person, place, and time. She exhibits normal muscle tone. Coordination normal.  Skin: Skin is warm and dry. Capillary refill takes less than 2 seconds. No rash noted. No erythema.  Psychiatric: She has a normal mood and affect. Her  behavior is normal.  Nursing note and vitals reviewed.    ED Treatments / Results  Labs (all labs ordered are listed, but only abnormal results are displayed) Labs Reviewed  COMPREHENSIVE METABOLIC PANEL - Abnormal; Notable for the following components:      Result Value   CO2 21 (*)    All other components within normal limits  URINALYSIS, ROUTINE W REFLEX MICROSCOPIC - Abnormal; Notable for the following components:   APPearance HAZY (*)    Hgb urine dipstick SMALL (*)    Bacteria, UA FEW (*)    All other components within normal limits  LIPASE, BLOOD  CBC    EKG None  Radiology Ct Renal Stone Study  Result Date: 07/25/2017 CLINICAL DATA:  Lower abdominal pain EXAM: CT ABDOMEN AND PELVIS WITHOUT CONTRAST TECHNIQUE: Multidetector CT imaging of the abdomen and pelvis was performed following the standard protocol without oral or IV contrast. COMPARISON:  June 07, 2016 FINDINGS: Lower chest: Lung bases are clear. Hepatobiliary: There is a tiny granuloma in the left lobe of the liver. No other liver lesions are evident on this noncontrast enhanced study. Gallbladder is absent. There is no biliary duct dilatation. Pancreas: No pancreatic mass or inflammatory focus. Spleen: No splenic lesions are evident. Adrenals/Urinary Tract: Adrenals bilaterally appear normal. There is no renal mass or hydronephrosis on either side. There is a 1 mm calculus in the mid right kidney. There is a 5 x 3 mm calculus in the lower pole of the right kidney with two nearby 1 mm calculi in the lower pole the right kidney. On the left, there is a 3 mm calculus in the midportion of the left kidney. There is a 5 x 3 mm calculus in the mid to lower pole left kidney with a 1 mm calculus in a 2 mm calculus slightly more inferiorly in the left kidney. There is no appreciable ureteral calculus on either side. Urinary bladder is midline with wall thickness within normal limits. Stomach/Bowel: There is no appreciable bowel  wall or mesenteric thickening. There are occasional sigmoid diverticula without diverticulitis evident. There  is no evident bowel obstruction. No free air or portal venous air. Vascular/Lymphatic: There is no abdominal aortic aneurysm. No vascular lesions are evident. No adenopathy is appreciable in the abdomen or pelvis. Reproductive: Uterus is absent.  There is no evident pelvic mass. Other: Appendix is absent. There is no periappendiceal region inflammatory change. There is no ascites or abscess in the abdomen or pelvis. Musculoskeletal: There are no evident blastic or lytic bone lesions. There is no intramuscular or abdominal wall lesion. IMPRESSION: 1. Several nonobstructing intrarenal calculi bilaterally. No hydronephrosis or ureteral calculus on either side. Urinary bladder wall thickness is within normal limits. 2.  No evident bowel obstruction.  No abscess.  Appendix absent. 3.  Gallbladder absent.  Uterus absent. Electronically Signed   By: Bretta Bang III M.D.   On: 07/25/2017 14:57    Procedures Procedures (including critical care time)  Medications Ordered in ED Medications  sodium chloride 0.9 % bolus 1,000 mL (0 mLs Intravenous Stopped 07/25/17 1533)  morphine 4 MG/ML injection 4 mg (4 mg Intravenous Given 07/25/17 1420)  ondansetron (ZOFRAN) injection 4 mg (4 mg Intravenous Given 07/25/17 1420)  ketorolac (TORADOL) 30 MG/ML injection 30 mg (30 mg Intravenous Given 07/25/17 1540)     Initial Impression / Assessment and Plan / ED Course  I have reviewed the triage vital signs and the nursing notes.  Pertinent labs & imaging results that were available during my care of the patient were reviewed by me and considered in my medical decision making (see chart for details).     Patient is advised to follow-up with her urologist.  Patient states her pain is under good control at this point.  I advised her to return here for any worsening in her condition.  I feel this is less likely  assist based the fact she has no palpable abdominal pain.  I did advise her to follow-up with her primary doctor as well.  Final Clinical Impressions(s) / ED Diagnoses   Final diagnoses:  None    ED Discharge Orders    None       Charlestine Night, PA-C 07/25/17 1605    Arby Barrette, MD 08/01/17 1537

## 2017-07-25 NOTE — Discharge Instructions (Addendum)
Return here as needed.  Follow-up with your urologist as soon as possible and your primary doctor.

## 2017-07-25 NOTE — ED Triage Notes (Signed)
Patient complains of ongoing LLQ pain x 1 week. Seen by primary MD and KUB due to hx of stones. No hematuria. States that she is to have U/S scheduled for possible cyst. No dysuria. No associated symptoms other than nausea

## 2017-07-28 ENCOUNTER — Telehealth: Payer: Self-pay | Admitting: Urology

## 2017-07-28 NOTE — Telephone Encounter (Signed)
No obstructing stones seen thus no need to move up follow up or repeat imaging.    Vanna ScotlandAshley Haylee Mcanany, MD

## 2017-07-28 NOTE — Telephone Encounter (Signed)
Patient called and stated that she ended up in the ED over the weekend and had a CT scan and a KUB on the 24th. She is due to see you on the 14th with a KUB prior. Does she still need to get the KUB prior to her app? And she wanted to know if you could take a look at her CT and see if she needed to be seen sooner?   Marcelino DusterMichelle

## 2017-07-29 ENCOUNTER — Telehealth: Payer: Self-pay | Admitting: Physician Assistant

## 2017-07-29 NOTE — Telephone Encounter (Signed)
Pelvic ultrasound was scheduled but cancelled at pt's request.She states she was seen in ER and a CT was done

## 2017-07-31 ENCOUNTER — Ambulatory Visit: Payer: BLUE CROSS/BLUE SHIELD | Admitting: Physician Assistant

## 2017-07-31 ENCOUNTER — Encounter: Payer: Self-pay | Admitting: Physician Assistant

## 2017-07-31 VITALS — BP 114/80 | HR 76 | Temp 98.2°F | Resp 16 | Wt 192.0 lb

## 2017-07-31 DIAGNOSIS — R102 Pelvic and perineal pain: Secondary | ICD-10-CM | POA: Diagnosis not present

## 2017-07-31 LAB — POCT URINALYSIS DIPSTICK
Bilirubin, UA: NEGATIVE
Glucose, UA: NEGATIVE
Ketones, UA: NEGATIVE
Leukocytes, UA: NEGATIVE
Nitrite, UA: NEGATIVE
Protein, UA: NEGATIVE
Spec Grav, UA: 1.01 (ref 1.010–1.025)
Urobilinogen, UA: 0.2 E.U./dL
pH, UA: 7.5 (ref 5.0–8.0)

## 2017-07-31 NOTE — Patient Instructions (Signed)
Ovarian Cyst An ovarian cyst is a fluid-filled sac on an ovary. The ovaries are organs that make eggs in women. Most ovarian cysts go away on their own and are not cancerous (are benign). Some cysts need treatment. Follow these instructions at home:  Take over-the-counter and prescription medicines only as told by your doctor.  Do not drive or use heavy machinery while taking prescription pain medicine.  Get pelvic exams and Pap tests as often as told by your doctor.  Return to your normal activities as told by your doctor. Ask your doctor what activities are safe for you.  Do not use any products that contain nicotine or tobacco, such as cigarettes and e-cigarettes. If you need help quitting, ask your doctor.  Keep all follow-up visits as told by your doctor. This is important. Contact a doctor if:  Your periods are: ? Late. ? Irregular. ? Painful.  Your periods stop.  You have pelvic pain that does not go away.  You have pressure on your bladder.  You have trouble making your bladder empty when you pee (urinate).  You have pain during sex.  You have any of the following in your belly (abdomen): ? A feeling of fullness. ? Pressure. ? Discomfort. ? Pain that does not go away. ? Swelling.  You feel sick most of the time.  You have trouble pooping (have constipation).  You are not as hungry as usual (you lose your appetite).  You get very bad acne.  You start to have more hair on your body and face.  You are gaining weight or losing weight without changing your exercise and eating habits.  You think you may be pregnant. Get help right away if:  You have belly pain that is very bad or gets worse.  You cannot eat or drink without throwing up (vomiting).  You suddenly get a fever.  Your period is a lot heavier than usual. This information is not intended to replace advice given to you by your health care provider. Make sure you discuss any questions you have  with your health care provider. Document Released: 07/31/2007 Document Revised: 09/01/2015 Document Reviewed: 07/16/2015 Elsevier Interactive Patient Education  2018 Elsevier Inc.  

## 2017-07-31 NOTE — Addendum Note (Signed)
Addended by: Kavin LeechWALSH, Miryam Mcelhinney E on: 07/31/2017 03:51 PM   Modules accepted: Orders

## 2017-07-31 NOTE — Addendum Note (Signed)
Addended by: Kavin LeechWALSH, Deionna Marcantonio E on: 07/31/2017 02:42 PM   Modules accepted: Orders

## 2017-07-31 NOTE — Progress Notes (Signed)
Patient: Sophia LyonRebecca M Desrosier Female    DOB: 1980/07/24   37 y.o.   MRN: [id number] Visit Date: 07/31/2017  Today's Provider: Trey SailorsAdriana M Liliann File, PA-C   No chief complaint on file.  Subjective:    Sophia LyonRebecca M Lunz is a 37 y/o woman s/p hysterectomy who is presenting today with worsening left pelvic pain. She was seen on 07/18/2017 with similar pain that was sharp and stabbing. Patient has a longstanding and frequent history of kidney stones and reported it felt similar. There was some trace blood in her urine sample. Abdominal exam benign. KUB showed no ureteral stone. There was some debate between kidney stone vs. Ovarian cyst but she was treated empirically for kidney stones with flomax, narcotics, and bactrim in case of infectious symptoms. Pelvic and transvaginal ultrasound were scheduled outpatient.   She reported several days of relief between recurrence of symptoms. She reported a recurrence of symptoms several days later that came back to the extent that she had to leave work. She was directed to the ER and Ct renal stone showed non obstructing renal stones, no diverticulitis, no obvious pelvic mass. She was treated for pain and release. She cancelled the pelvic ultrasound because she was seen in the ER.  Today, her pain continues. It is left lower quadrant in the pelvis. It is sharp and stabbing. It is near constant. She reports it does feel like a stone though no stone has been discovered. Nausea without vomiting.  No fevers, chills, or back pain. She does not have a uterus but still has ovaries.  Abdominal Pain  This is a new problem. The problem occurs constantly. The pain is located in the LLQ. The quality of the pain is aching and dull. The abdominal pain does not radiate. Pertinent negatives include no constipation, diarrhea, headaches, nausea or vomiting. The pain is relieved by nothing. She has tried oral narcotic analgesics for the symptoms. The treatment provided no relief.        Allergies  Allergen Reactions  . Compazine Other (See Comments)    Dystonia  . Metoclopramide Other (See Comments)     Current Outpatient Medications:  .  ALPRAZolam (XANAX) 1 MG tablet, Take 0.5-1 tablets (0.5-1 mg total) by mouth 3 (three) times daily as needed for anxiety., Disp: 90 tablet, Rfl: 5 .  baclofen (LIORESAL) 10 MG tablet, Take 10 mg by mouth 2 (two) times daily as needed. , Disp: , Rfl: 0 .  chlorproMAZINE (THORAZINE) 25 MG tablet, take 25mg  by mouth as needed for migraines, Disp: , Rfl:  .  cyclobenzaprine (FLEXERIL) 5 MG tablet, Take 1 tablet (5 mg total) by mouth 3 (three) times daily as needed for muscle spasms., Disp: 30 tablet, Rfl: 1 .  gabapentin (NEURONTIN) 300 MG capsule, Take 1 capsule (300 mg total) by mouth at bedtime., Disp: 90 capsule, Rfl: 3 .  Multiple Vitamin (MULTIVITAMIN WITH MINERALS) TABS tablet, Take 1 tablet by mouth daily., Disp: , Rfl:  .  omeprazole (PRILOSEC) 20 MG capsule, TAKE 1 CAPSULE (20 MG TOTAL) BY MOUTH DAILY., Disp: 30 capsule, Rfl: 2 .  OnabotulinumtoxinA (BOTOX IJ), Inject 1 each as directed every 3 (three) months. Alternating with trigger point inj, Disp: , Rfl:  .  oxyCODONE-acetaminophen (PERCOCET/ROXICET) 5-325 MG tablet, Take 1 tablet by mouth every 6 (six) hours as needed for severe pain., Disp: 20 tablet, Rfl: 0 .  oxyCODONE-acetaminophen (PERCOCET/ROXICET) 5-325 MG tablet, Take 1 tablet by mouth every 6 (six) hours as  needed for severe pain., Disp: 10 tablet, Rfl: 0 .  promethazine (PHENERGAN) 25 MG tablet, Take 1 tablet (25 mg total) by mouth every 8 (eight) hours as needed for nausea or vomiting., Disp: 30 tablet, Rfl: 0 .  tamsulosin (FLOMAX) 0.4 MG CAPS capsule, Take 1 capsule (0.4 mg total) by mouth daily., Disp: 30 capsule, Rfl: 0 .  topiramate (TOPAMAX) 100 MG tablet, Take one tablet daily x 1 week and then can increase to 2 tabs PO daily (Patient taking differently: Take 100 mg by mouth daily. ), Disp: 60 tablet,  Rfl: 5 .  UNABLE TO FIND, Inject 1 each as directed every 3 (three) months. lidocaine mixed w/ steroid Treatment for migraines(trigger point inj) Alternating with botox inj, Disp: , Rfl:  .  valACYclovir (VALTREX) 500 MG tablet, Take 2 tablets (1,000 mg total) by mouth 2 (two) times daily as needed (cold sores)., Disp: 20 tablet, Rfl: 5  Review of Systems  Constitutional: Negative.   Gastrointestinal: Positive for abdominal pain. Negative for abdominal distention, anal bleeding, blood in stool, constipation, diarrhea, nausea, rectal pain and vomiting.  Genitourinary: Negative.   Neurological: Negative for dizziness, light-headedness and headaches.    Social History   Tobacco Use  . Smoking status: Former Smoker    Packs/day: 0.50    Years: 15.00    Pack years: 7.50    Types: Cigarettes    Start date: 11/03/1999    Last attempt to quit: 09/26/2014    Years since quitting: 2.8  . Smokeless tobacco: Never Used  Substance Use Topics  . Alcohol use: Yes    Alcohol/week: 0.0 oz    Comment: social-rare   Objective:   BP 114/80 (BP Location: Right Arm, Patient Position: Sitting, Cuff Size: Large)   Pulse 76   Temp 98.2 F (36.8 C) (Oral)   Resp 16   Wt 192 lb (87.1 kg)   BMI 30.07 kg/m  Vitals:   07/31/17 1208  BP: 114/80  Pulse: 76  Resp: 16  Temp: 98.2 F (36.8 C)  TempSrc: Oral  Weight: 192 lb (87.1 kg)     Physical Exam  Constitutional: She is oriented to person, place, and time. She appears well-developed and well-nourished.  Cardiovascular: Normal rate and regular rhythm.  Pulmonary/Chest: Effort normal and breath sounds normal.  Abdominal: She exhibits no distension and no mass. There is tenderness. There is no rebound.  Genitourinary: No labial fusion. There is no rash, tenderness, lesion or injury on the right labia. There is no rash, tenderness, lesion or injury on the left labia. Right adnexum displays no mass, no tenderness and no fullness. Left adnexum displays  tenderness. Left adnexum displays no mass and no fullness.  Neurological: She is alert and oriented to person, place, and time.  Skin: Skin is warm and dry.  Psychiatric: She has a normal mood and affect. Her behavior is normal.        Assessment & Plan:     1. Pelvic pain  Continued pelvic pain, worsening. Tenderness in left adnexa but no palpable mass. No cervix present. No vaginal discharge. CT did not show obstructing renal stones, no diverticulitis. Did not show pelvic mass but not as specific as ultrasound. Will order ultrasound to assess for ovarian cyst. Will provide pain medication and follow up pending u/s results. May need GI referral.  - US PELVIC COMPLETE WITH TRANSVAGINAL; Future  Return if symptoms worsen or fail to improve.  The entirety of the information documented in the History  of Present Illness, Review of Systems and Physical Exam were personally obtained by me. Portions of this information were initially documented by Kavin Leech, CMA and reviewed by me for thoroughness and accuracy.        Trey Sailors, PA-C  [facility]

## 2017-08-01 ENCOUNTER — Ambulatory Visit
Admission: RE | Admit: 2017-08-01 | Discharge: 2017-08-01 | Disposition: A | Discharge status: Home / Self Care | Payer: BLUE CROSS/BLUE SHIELD | Source: Ambulatory Visit | Attending: Physician Assistant | Admitting: Physician Assistant

## 2017-08-01 ENCOUNTER — Encounter: Payer: Self-pay | Admitting: Physician Assistant

## 2017-08-01 ENCOUNTER — Telehealth: Payer: Self-pay

## 2017-08-01 DIAGNOSIS — R1032 Left lower quadrant pain: Secondary | ICD-10-CM

## 2017-08-01 DIAGNOSIS — N200 Calculus of kidney: Secondary | ICD-10-CM

## 2017-08-01 DIAGNOSIS — R102 Pelvic and perineal pain: Secondary | ICD-10-CM | POA: Diagnosis not present

## 2017-08-01 MED ORDER — OXYCODONE-ACETAMINOPHEN 5-325 MG PO TABS: 1.0000 | ORAL_TABLET | Freq: Four times a day (QID) | ORAL | 0 refills | Status: AC | PRN

## 2017-08-01 NOTE — Telephone Encounter (Signed)
-----   Message from Trey SailorsAdriana M Pollak, [place]PA-C sent at 08/01/2017 12:18 PM EDT ----- The ultrasound shows a RIGHT sided possibly hemorrhagic ovarian cyst with recommendations to repeat ultrasound in 6 weeks. The LEFT ovary is actually normal in appearance. Unfortunately, this doesn't explain her pain, did speak to the ultrasound tech who described pain on the left side with transvaginal ultrasound. At this point, I don't have an exact explanation for her pain. I know we talked a bit about GI but if her pain was amplified by the transvaginal ultrasound I think it might be reasonable to see an OBGYN. Let me know her thoughts.

## 2017-08-01 NOTE — Telephone Encounter (Signed)
Pt advised. States the transvaginal US caused residual cramping that caused her to leave work for the day. She has had these in the past while pregnant, without this severity of pain. She would like to see whichever GYN you recommend.

## 2017-08-03 LAB — CULTURE, URINE COMPREHENSIVE

## 2017-08-04 ENCOUNTER — Other Ambulatory Visit: Payer: Self-pay | Admitting: Physician Assistant

## 2017-08-04 ENCOUNTER — Ambulatory Visit: Payer: BLUE CROSS/BLUE SHIELD

## 2017-08-04 DIAGNOSIS — R102 Pelvic and perineal pain: Secondary | ICD-10-CM

## 2017-08-04 NOTE — Telephone Encounter (Signed)
Referral for pelvic pain placed.

## 2017-08-04 NOTE — Progress Notes (Signed)
Ref pelvic pain placed.

## 2017-08-05 ENCOUNTER — Telehealth: Payer: Self-pay

## 2017-08-05 NOTE — Telephone Encounter (Signed)
Pt advised.   Thanks,   -Nathalya Wolanski  

## 2017-08-05 NOTE — Telephone Encounter (Signed)
-----   Message from Adriana M Trey SailorsPollak, [place]PA-C sent at 08/05/2017 10:01 AM EDT ----- Urine culture negative.

## 2017-08-07 ENCOUNTER — Telehealth: Payer: Self-pay | Admitting: Obstetrics & Gynecology

## 2017-08-07 NOTE — Telephone Encounter (Signed)
[facility] referring for Pelvic pain. Called and left voicemail for patient to call back to be schedule °

## 2017-08-08 ENCOUNTER — Encounter: Payer: Self-pay | Admitting: Urology

## 2017-08-08 ENCOUNTER — Ambulatory Visit: Payer: BLUE CROSS/BLUE SHIELD | Admitting: Urology

## 2017-08-08 VITALS — BP 108/75 | HR 79 | Resp 16 | Ht 67.0 in | Wt 191.8 lb

## 2017-08-08 DIAGNOSIS — N200 Calculus of kidney: Secondary | ICD-10-CM

## 2017-08-08 DIAGNOSIS — R102 Pelvic and perineal pain: Secondary | ICD-10-CM

## 2017-08-08 MED ORDER — KETOROLAC TROMETHAMINE 15.75 MG/SPRAY NA SOLN: 1.0000 | Freq: Three times a day (TID) | NASAL | 1 refills | Status: DC | PRN

## 2017-08-08 NOTE — Progress Notes (Signed)
08/08/2017 9:19 AM   [name] 12/01/1980 [id number]  Referring provider: Margaretann LovelessBurnette, Jennifer M, PA-C [address] [place], [place] [id number]  Chief Complaint  Patient presents with  . Nephrolithiasis    HPI: 37 year old female with a history of kidney stones who returns today for routine follow-up.  Since her last visit, she was seen and evaluated in the emergency room on 07/25/2017 with pelvic pain felt to be related to possible stone at the time.  She had a KUB as well as a CT scan which showed bilateral nonobstructing stones up to 5 mm.  She had no ureteral calculi.  She is now undergoing GYN evaluation for pelvic pain.  No associated urinary symptoms.  No additional stone episodes no gross hematuria.    Stone analysis shows 5% calcium oxalate dihydrate, 45% calcium oxalate monohydrate, and 50% calcium phosphate.  She is been previously managed on potassium citrate. This upsets her stomach and she is only able to take this medication every other day at most.    She has had ESWL x 4 and URS x 1.  She reports that she generally has stone episodes every other year around this time of year.   PMH: Past Medical History:  Diagnosis Date  . Acute pyelonephritis 06/29/2012  . Anxiety   . Asthma    AS A CHILD  . Complication of anesthesia   . Gallstones   . GERD (gastroesophageal reflux disease)   . History of kidney stones   . Kidney stones    bilateral  . Migraine   . Pelvic pain in female   . PONV (postoperative nausea and vomiting)    WITH HYSTERECTOMY ONLY  . Pyelonephritis 2015  . Renal mass    right  . Tobacco abuse     Surgical History: Past Surgical History:  Procedure Laterality Date  . ABDOMINAL HYSTERECTOMY  2006  . ANKLE ARTHROSCOPY Left   . ANKLE ARTHROSCOPY Left 11/24/2015  . APPENDECTOMY  07/26/13  . CAST APPLICATION  12/25/2011   Procedure: CAST APPLICATION;  Surgeon: Javier DockerJeffrey C Beane, MD;  Location: [facility];  Service:  Orthopedics;  Laterality: Left;  . CHOLECYSTECTOMY  03/11/2011   Procedure: LAPAROSCOPIC CHOLECYSTECTOMY WITH INTRAOPERATIVE CHOLANGIOGRAM;  Surgeon: Atilano InaEric M Wilson, MD;  [facility];  Service: General;  Laterality: N/A;  . CHOLECYSTECTOMY, LAPAROSCOPIC    . CYSTOSCOPY/URETEROSCOPY/HOLMIUM LASER/STENT PLACEMENT Left 06/18/2016   Procedure: CYSTOSCOPY/URETEROSCOPY/HOLMIUM LASER/STENT PLACEMENT;  Surgeon: Vanna ScotlandAshley Leonda Cristo, MD;  Location: [facility];  Service: Urology;  Laterality: Left;  . LITHOTRIPSY     x 4  . ORIF ANKLE FRACTURE  12/25/2011   Procedure: OPEN REDUCTION INTERNAL FIXATION (ORIF) ANKLE FRACTURE;  Surgeon: Javier DockerJeffrey C Beane, MD;  Location: [facility];  Service: Orthopedics;  Laterality: Left;  . TONSILLECTOMY      Home Medications:  Allergies as of 08/08/2017      Reactions   Compazine Other (See Comments)   Dystonia   Metoclopramide Other (See Comments)      Medication List        Accurate as of 08/08/17  9:19 AM. Always use your most recent med list.          ALPRAZolam 1 MG tablet Commonly known as:  XANAX Take 0.5-1 tablets (0.5-1 mg total) by mouth 3 (three) times daily as needed for anxiety.   baclofen 10 MG tablet Commonly known as:  LIORESAL Take 10 mg by mouth 2 (two) times daily as needed.   BOTOX IJ Inject  1 each as directed every 3 (three) months. Alternating with trigger point inj   chlorproMAZINE 25 MG tablet Commonly known as:  THORAZINE take 25mg  by mouth as needed for migraines   cyclobenzaprine 5 MG tablet Commonly known as:  FLEXERIL Take 1 tablet (5 mg total) by mouth 3 (three) times daily as needed for muscle spasms.   gabapentin 300 MG capsule Commonly known as:  NEURONTIN Take 1 capsule (300 mg total) by mouth at bedtime.   multivitamin with minerals Tabs tablet Take 1 tablet by mouth daily.   omeprazole 20 MG capsule Commonly known as:  PRILOSEC TAKE 1 CAPSULE (20 MG TOTAL) BY MOUTH DAILY.   promethazine 25 MG tablet Commonly known  as:  PHENERGAN Take 1 tablet (25 mg total) by mouth every 8 (eight) hours as needed for nausea or vomiting.   tamsulosin 0.4 MG Caps capsule Commonly known as:  FLOMAX Take 1 capsule (0.4 mg total) by mouth daily.   topiramate 100 MG tablet Commonly known as:  TOPAMAX Take one tablet daily x 1 week and then can increase to 2 tabs PO daily   UNABLE TO FIND Inject 1 each as directed every 3 (three) months. lidocaine mixed w/ steroid Treatment for migraines(trigger point inj) Alternating with botox inj   valACYclovir 500 MG tablet Commonly known as:  VALTREX Take 2 tablets (1,000 mg total) by mouth 2 (two) times daily as needed (cold sores).       Allergies:  Allergies  Allergen Reactions  . Compazine Other (See Comments)    Dystonia  . Metoclopramide Other (See Comments)    Family History: Family History  Problem Relation Age of Onset  . Diabetes Mother   . Diabetes Father   . Heart failure Father   . Hypertension Father   . Pulmonary fibrosis Father   . Nephrolithiasis Father   . Kidney cancer Neg Hx   . Bladder Cancer Neg Hx     Social History:  reports that she quit smoking about 2 years ago. Her smoking use included cigarettes. She started smoking about 17 years ago. She has a 7.50 pack-year smoking history. She has never used smokeless tobacco. She reports that she drinks alcohol. She reports that she does not use drugs.  ROS: UROLOGY Frequent Urination?: No Hard to postpone urination?: No Burning/pain with urination?: No Get up at night to urinate?: No Leakage of urine?: No Urine stream starts and stops?: No Trouble starting stream?: No Do you have to strain to urinate?: No Blood in urine?: No Urinary tract infection?: No Sexually transmitted disease?: No Injury to kidneys or bladder?: No Painful intercourse?: No Weak stream?: No Currently pregnant?: No Vaginal bleeding?: No  Gastrointestinal Nausea?: No Vomiting?: No Indigestion/heartburn?:  No Diarrhea?: No Constipation?: No  Constitutional Fever: No Night sweats?: No Weight loss?: No Fatigue?: No  Skin Skin rash/lesions?: No Itching?: No  Eyes Blurred vision?: No Double vision?: No  Ears/Nose/Throat Sore throat?: No Sinus problems?: No  Hematologic/Lymphatic Swollen glands?: No Easy bruising?: No  Cardiovascular Leg swelling?: No Chest pain?: No  Respiratory Cough?: No Shortness of breath?: No  Endocrine Excessive thirst?: No  Musculoskeletal Back pain?: No Joint pain?: No  Neurological Headaches?: No Dizziness?: No  Psychologic Depression?: No Anxiety?: Yes  Physical Exam: BP 108/75   Pulse 79   Resp 16   Ht 5\' 7"  (1.702 m)   Wt 191 lb 12.8 oz (87 kg)   SpO2 96%   BMI 30.04 kg/m   Constitutional:  Alert and oriented, No acute distress. HEENT: [place] AT, moist mucus membranes.  Trachea midline, no masses. Cardiovascular: No clubbing, cyanosis, or edema. Respiratory: Normal respiratory effort, no increased work of breathing. Skin: No rashes, bruises or suspicious lesions. Neurologic: Grossly intact, no focal deficits, moving all 4 extremities. Psychiatric: Normal mood and affect.  Laboratory Data: Lab Results  Component Value Date   WBC 8.3 07/25/2017   HGB 14.0 07/25/2017   HCT 43.2 07/25/2017   MCV 93.5 07/25/2017   PLT 225 07/25/2017    Lab Results  Component Value Date   CREATININE 0.83 07/25/2017    Urinalysis N/a  Pertinent Imaging: Results for orders placed during the hospital encounter of 07/18/17  DG Abd 1 View   Narrative CLINICAL DATA:  Left lower quadrant pain for several days  EXAM: ABDOMEN - 1 VIEW  COMPARISON:  06/15/2016  FINDINGS: Scattered large and small bowel gas is noted. Postsurgical changes are noted in the right lower quadrant. Multiple small renal calculi are again identified and stable. Previously seen distal left ureteral stone is no longer identified. No definitive ureteral calculi  are seen. No bony abnormality is noted.  IMPRESSION: Nonobstructing bilateral renal calculi.   Electronically Signed   By: Alcide Clever M.D.   On: 07/18/2017 16:08    Results for orders placed during the hospital encounter of 07/25/17  CT Renal Stone Study   Narrative CLINICAL DATA:  Lower abdominal pain  EXAM: CT ABDOMEN AND PELVIS WITHOUT CONTRAST  TECHNIQUE: Multidetector CT imaging of the abdomen and pelvis was performed following the standard protocol without oral or IV contrast.  COMPARISON:  June 07, 2016  FINDINGS: Lower chest: Lung bases are clear.  Hepatobiliary: There is a tiny granuloma in the left lobe of the liver. No other liver lesions are evident on this noncontrast enhanced study. Gallbladder is absent. There is no biliary duct dilatation.  Pancreas: No pancreatic mass or inflammatory focus.  Spleen: No splenic lesions are evident.  Adrenals/Urinary Tract: Adrenals bilaterally appear normal. There is no renal mass or hydronephrosis on either side. There is a 1 mm calculus in the mid right kidney. There is a 5 x 3 mm calculus in the lower pole of the right kidney with two nearby 1 mm calculi in the lower pole the right kidney. On the left, there is a 3 mm calculus in the midportion of the left kidney. There is a 5 x 3 mm calculus in the mid to lower pole left kidney with a 1 mm calculus in a 2 mm calculus slightly more inferiorly in the left kidney. There is no appreciable ureteral calculus on either side. Urinary bladder is midline with wall thickness within normal limits.  Stomach/Bowel: There is no appreciable bowel wall or mesenteric thickening. There are occasional sigmoid diverticula without diverticulitis evident. There is no evident bowel obstruction. No free air or portal venous air.  Vascular/Lymphatic: There is no abdominal aortic aneurysm. No vascular lesions are evident. No adenopathy is appreciable in the abdomen or  pelvis.  Reproductive: Uterus is absent.  There is no evident pelvic mass.  Other: Appendix is absent. There is no periappendiceal region inflammatory change. There is no ascites or abscess in the abdomen or pelvis.  Musculoskeletal: There are no evident blastic or lytic bone lesions. There is no intramuscular or abdominal wall lesion.  IMPRESSION: 1. Several nonobstructing intrarenal calculi bilaterally. No hydronephrosis or ureteral calculus on either side. Urinary bladder wall thickness is within normal limits.  2.  No evident bowel obstruction.  No abscess.  Appendix absent.  3.  Gallbladder absent.  Uterus absent.   Electronically Signed   By: Bretta Bang III M.D.   On: 07/25/2017 14:57    CT scan imaging as well as KUB were personally reviewed today with the patient.  As compared to last year was essentially stable,  Assessment & Plan:    1. Bilateral nephrolithiasis Stones essentially stable, nonobstructing Briefly reviewed stone diet, encourage hydration No intervention recommended at this time KUB in 1 year or sooner as needed Intranasal Toradol ordered for acute stone crisis as precaution, to be used only in episodes of severe pain   2. Pelvic pain in female Follow-up scheduled with OB/GYN Monday   Return in about 1 year (around 08/09/2018) for KUB.  Vanna Scotland, MD  [facility] [address], [address] [place], [place] [address] [phone]

## 2017-08-08 NOTE — Telephone Encounter (Signed)
Patient is schedule 08/11/17 with ABC

## 2017-08-11 ENCOUNTER — Ambulatory Visit (INDEPENDENT_AMBULATORY_CARE_PROVIDER_SITE_OTHER): Payer: BLUE CROSS/BLUE SHIELD | Admitting: Obstetrics and Gynecology

## 2017-08-11 ENCOUNTER — Encounter: Payer: Self-pay | Admitting: Obstetrics and Gynecology

## 2017-08-11 VITALS — BP 114/60 | HR 64 | Ht 67.0 in | Wt 193.0 lb

## 2017-08-11 DIAGNOSIS — R1032 Left lower quadrant pain: Secondary | ICD-10-CM | POA: Diagnosis not present

## 2017-08-11 NOTE — Progress Notes (Signed)
Sophia Loveless, PA-C   Chief Complaint  Patient presents with  . Pelvic Pain    dull, achy pain, lower left side started on 07/14/17 started to go away then came back yesterday     HPI:      Ms. [name] is a 37 y.o. G2P2 who LMP was No LMP recorded. Patient has had a hysterectomy., presents today for NP ref for LLQ pain for the past month, referred by PCP. Pt spontaneously developed a deep, dull achy, constant pain LLQ about 07/14/17. Pain radiates to back and down LT upper thigh. Pt saw her PCP and given hx of kidney stones, pt had KUB, UA and C&S. Results were neg. Pt then had CT scan that showed small stones bilat kidneys that are non-contributory to sx. She saw urology and was cleared. She had a GYN u/s 6/19 which showed a 2.1 x 1.7 hemorrhagic RT ovar cyst. Pt tried NSAIDs without relief of pain. Was given percocet that has helped but takes sparingly. Tried baclofen without relief. GYN u/s increased sx LLQ for about 24 hrs. Sx started to improve but flared up again yesterday. Pt has had to miss work with sx. No aggrav/allev factors.   Pt is s/p TAH in 2006 due to endometriosis, still has bilat ovaries. Pt had RTO cystectomy 07/2012 with Dr. Patton Salles.   Pt denies any GI sx. Hx of constipation and takes miralax daily with sx control. Has BM daily. No n/v/d. No fevers. Mild diverticula on CT without diverticulitis. No vag sx, no vag bleeding, no urin sx. Pt is sex active, no new partners. Having increased pain with sex recently, no postcoital bleeding.    Past Medical History:  Diagnosis Date  . Acute pyelonephritis 06/29/2012  . Anxiety   . Asthma    AS A CHILD  . Complication of anesthesia   . Gallstones   . GERD (gastroesophageal reflux disease)   . History of kidney stones   . Kidney stones    bilateral  . Migraine   . Pelvic pain in female   . PONV (postoperative nausea and vomiting)    WITH HYSTERECTOMY ONLY  . Pyelonephritis 2015  . Renal mass    right   . Tobacco abuse     Past Surgical History:  Procedure Laterality Date  . ABDOMINAL HYSTERECTOMY  2006  . ANKLE ARTHROSCOPY Left   . ANKLE ARTHROSCOPY Left 11/24/2015  . APPENDECTOMY  07/26/13  . CAST APPLICATION  12/25/2011   Procedure: CAST APPLICATION;  Surgeon: Javier Docker, MD;  Location: [facility];  Service: Orthopedics;  Laterality: Left;  . CHOLECYSTECTOMY  03/11/2011   Procedure: LAPAROSCOPIC CHOLECYSTECTOMY WITH INTRAOPERATIVE CHOLANGIOGRAM;  Surgeon: Atilano Ina, MD;  Location: [facility] OR;  Service: General;  Laterality: N/A;  . CHOLECYSTECTOMY, LAPAROSCOPIC    . CYSTOSCOPY/URETEROSCOPY/HOLMIUM LASER/STENT PLACEMENT Left 06/18/2016   Procedure: CYSTOSCOPY/URETEROSCOPY/HOLMIUM LASER/STENT PLACEMENT;  Surgeon: Vanna Scotland, MD;  Location: [facility] OR[facility];  Service: Urology;  Laterality: Left;  . LITHOTRIPSY     x 4  . ORIF ANKLE FRACTURE  12/25/2011   Procedure: OPEN REDUCTION INTERNAL FIXATION (ORIF) ANKLE FRACTURE;  Surgeon: Javier Docker, MD;  Location: [facility];  Service: Orthopedics;  Laterality: Left;  . TONSILLECTOMY      Family History  Problem Relation Age of Onset  . Diabetes Mother   . Diabetes Father   . Heart failure Father   . Hypertension Father   . Pulmonary fibrosis Father   . Nephrolithiasis  Father   . Kidney cancer Neg Hx   . Bladder Cancer Neg Hx     Social History   Socioeconomic History  . Marital status: Married    Spouse name: Not on file  . Number of children: Not on file  . Years of education: Not on file  . Highest education level: Not on file  Occupational History  . Not on file  Social Needs  . Financial resource strain: Not on file  . Food insecurity:    Worry: Not on file    Inability: Not on file  . Transportation needs:    Medical: Not on file    Non-medical: Not on file  Tobacco Use  . Smoking status: Former Smoker    Packs/day: 0.50    Years: 15.00    Pack years: 7.50    Types: Cigarettes    Start date: 11/03/1999    Last  attempt to quit: 09/26/2014    Years since quitting: 2.8  . Smokeless tobacco: Never Used  Substance and Sexual Activity  . Alcohol use: Yes    Alcohol/week: 0.0 oz    Comment: social-rare  . Drug use: No  . Sexual activity: Yes    Birth control/protection: Surgical    Comment: Hysterectomy   Lifestyle  . Physical activity:    Days per week: Not on file    Minutes per session: Not on file  . Stress: Not on file  Relationships  . Social connections:    Talks on phone: Not on file    Gets together: Not on file    Attends religious service: Not on file    Active member of club or organization: Not on file    Attends meetings of clubs or organizations: Not on file    Relationship status: Not on file  . Intimate partner violence:    Fear of current or ex partner: Not on file    Emotionally abused: Not on file    Physically abused: Not on file    Forced sexual activity: Not on file  Other Topics Concern  . Not on file  Social History Narrative  . Not on file    Outpatient Medications Prior to Visit  Medication Sig Dispense Refill  . ALPRAZolam (XANAX) 1 MG tablet Take 0.5-1 tablets (0.5-1 mg total) by mouth 3 (three) times daily as needed for anxiety. 90 tablet 5  . baclofen (LIORESAL) 10 MG tablet Take 10 mg by mouth 2 (two) times daily as needed.   0  . chlorproMAZINE (THORAZINE) 25 MG tablet take 25mg  by mouth as needed for migraines    . gabapentin (NEURONTIN) 300 MG capsule Take 1 capsule (300 mg total) by mouth at bedtime. 90 capsule 3  . Multiple Vitamin (MULTIVITAMIN WITH MINERALS) TABS tablet Take 1 tablet by mouth daily.    Marland Kitchen. omeprazole (PRILOSEC) 20 MG capsule TAKE 1 CAPSULE (20 MG TOTAL) BY MOUTH DAILY. 30 capsule 2  . OnabotulinumtoxinA (BOTOX IJ) Inject 1 each as directed every 3 (three) months. Alternating with trigger point inj    . promethazine (PHENERGAN) 25 MG tablet Take 1 tablet (25 mg total) by mouth every 8 (eight) hours as needed for nausea or vomiting. 30  tablet 0  . UNABLE TO FIND Inject 1 each as directed every 3 (three) months. lidocaine mixed w/ steroid Treatment for migraines(trigger point inj) Alternating with botox inj    . valACYclovir (VALTREX) 500 MG tablet Take 2 tablets (1,000 mg total) by mouth 2 (  two) times daily as needed (cold sores). 20 tablet 5  . Ketorolac Tromethamine (SPRIX) 15.75 MG/SPRAY SOLN Place 1 spray into the nose every 8 (eight) hours as needed. (Patient not taking: Reported on 08/11/2017) 5 each 1  . oxyCODONE-acetaminophen (PERCOCET/ROXICET) 5-325 MG tablet Take 1 tablet by mouth every 4 (four) hours as needed for severe pain.    Marland Kitchen topiramate (TOPAMAX) 100 MG tablet Take one tablet daily x 1 week and then can increase to 2 tabs PO daily (Patient not taking: Reported on 08/11/2017) 60 tablet 5  . cyclobenzaprine (FLEXERIL) 5 MG tablet Take 1 tablet (5 mg total) by mouth 3 (three) times daily as needed for muscle spasms. 30 tablet 1  . tamsulosin (FLOMAX) 0.4 MG CAPS capsule Take 1 capsule (0.4 mg total) by mouth daily. 30 capsule 0   No facility-administered medications prior to visit.       ROS:  Review of Systems  Constitutional: Negative for fatigue, fever and unexpected weight change.  Respiratory: Negative for cough, shortness of breath and wheezing.   Cardiovascular: Negative for chest pain, palpitations and leg swelling.  Gastrointestinal: Negative for blood in stool, constipation, diarrhea, nausea and vomiting.  Endocrine: Negative for cold intolerance, heat intolerance and polyuria.  Genitourinary: Positive for pelvic pain. Negative for dyspareunia, dysuria, flank pain, frequency, genital sores, hematuria, menstrual problem, urgency, vaginal bleeding, vaginal discharge and vaginal pain.  Musculoskeletal: Positive for back pain. Negative for joint swelling and myalgias.  Skin: Negative for rash.  Neurological: Negative for dizziness, syncope, light-headedness, numbness and headaches.  Hematological:  Negative for adenopathy.  Psychiatric/Behavioral: Positive for agitation. Negative for confusion, sleep disturbance and suicidal ideas. The patient is not nervous/anxious.   BREAST: No symptoms   OBJECTIVE:   Vitals:  BP 114/60   Pulse 64   Ht 5\' 7"  (1.702 m)   Wt 193 lb (87.5 kg)   BMI 30.23 kg/m   Physical Exam  Constitutional: She is oriented to person, place, and time. Vital signs are normal. She appears well-developed.  Pulmonary/Chest: Effort normal.  Abdominal: Soft. She exhibits no mass. There is no tenderness. There is no rigidity and no guarding.  Genitourinary: Vagina normal. There is no rash, tenderness or lesion on the right labia. There is no rash, tenderness or lesion on the left labia. Uterus is not tender. Right adnexum displays tenderness. Right adnexum displays no mass. Left adnexum displays tenderness. Left adnexum displays no mass. No erythema or tenderness in the vagina. No vaginal discharge found.  Genitourinary Comments: MILD TENDERNESS BLQ WITH PALPATION  Musculoskeletal: Normal range of motion.  Neurological: She is alert and oriented to person, place, and time.  Psychiatric: She has a normal mood and affect. Her behavior is normal. Thought content normal.  Vitals reviewed.   Assessment/Plan: LLQ pain - Refer to MD for dx lap conf. Neg eval for GU, GI etiology. Mildly tender on exam. Question endometriosis vs adhesions.   PCP AND ED NOTES REVIEWED; CT SCAN AND U/S REVIEWED.    Return in about 3 days (around 08/14/2017) for dx lap consultation with MD.  Ilona Sorrel. , PA-C 08/11/2017 3:34 PM

## 2017-08-11 NOTE — Patient Instructions (Signed)
I value your feedback and entrusting us with your care. If you get a [facility] patient survey, I would appreciate you taking the time to let us know about your experience today. Thank you! 

## 2017-08-15 ENCOUNTER — Encounter: Payer: Self-pay | Admitting: Physician Assistant

## 2017-08-15 DIAGNOSIS — R102 Pelvic and perineal pain: Secondary | ICD-10-CM

## 2017-08-21 MED ORDER — OXYCODONE-ACETAMINOPHEN 5-325 MG PO TABS: 1.0000 | ORAL_TABLET | ORAL | 0 refills | Status: AC | PRN

## 2017-08-26 DIAGNOSIS — M542 Cervicalgia: Secondary | ICD-10-CM | POA: Diagnosis not present

## 2017-08-26 DIAGNOSIS — G518 Other disorders of facial nerve: Secondary | ICD-10-CM | POA: Diagnosis not present

## 2017-08-26 DIAGNOSIS — G43719 Chronic migraine without aura, intractable, without status migrainosus: Secondary | ICD-10-CM | POA: Diagnosis not present

## 2017-08-26 DIAGNOSIS — M791 Myalgia, unspecified site: Secondary | ICD-10-CM | POA: Diagnosis not present

## 2017-08-27 ENCOUNTER — Telehealth: Payer: Self-pay | Admitting: Obstetrics & Gynecology

## 2017-08-27 ENCOUNTER — Encounter: Payer: Self-pay | Admitting: Obstetrics & Gynecology

## 2017-08-27 ENCOUNTER — Ambulatory Visit (INDEPENDENT_AMBULATORY_CARE_PROVIDER_SITE_OTHER): Payer: BLUE CROSS/BLUE SHIELD | Admitting: Obstetrics & Gynecology

## 2017-08-27 VITALS — BP 100/70 | Ht 67.0 in | Wt 196.0 lb

## 2017-08-27 DIAGNOSIS — N809 Endometriosis, unspecified: Secondary | ICD-10-CM

## 2017-08-27 DIAGNOSIS — N83209 Unspecified ovarian cyst, unspecified side: Secondary | ICD-10-CM

## 2017-08-27 DIAGNOSIS — N83201 Unspecified ovarian cyst, right side: Secondary | ICD-10-CM

## 2017-08-27 DIAGNOSIS — R102 Pelvic and perineal pain: Secondary | ICD-10-CM

## 2017-08-27 NOTE — Telephone Encounter (Signed)
Patient is aware of Pre-admit Testing phone interview on 08/29/17 9am-1pm, and OR on 09/02/17. Patient is aware [facility] and [facility].

## 2017-08-27 NOTE — H&P (View-Only) (Signed)
Gynecology Pelvic Pain Evaluation   Chief Complaint: Pain  History of Present Illness:   Patient is a 37 y.o. [id number] who LMP was No LMP recorded. Patient has had a hysterectomy., presents today for a problem visit.  She complains of pain.   Her pain is localized to the LLQ area, described as intermittent, sharp, stabbing and ina cyclical fashion, began 2 months ago as worse pains although has had monthly pains for years (known endometriosis) and its severity is described as severe. The pain radiates to the  Non-radiating. She has these associated symptoms which include none. Patient has these modifiers which include nothing that make it better and unable to associate with any factor that make it worse.  Context includes: Pt has had known endometriosis for years; 2001 had infertility but was successful w pregnancy; then had TLH in 2006; has had recurrent ovarian cysts over the years w one laparoscopy for that.  Denies dyspareunia.  Has also had chronic constipation.  Previous evaluation: PCP and visit here with [place]. Prior Diagnosis: endometriosis in past; recent ovarian cyst by [place]. Previous Treatment: none.  PMHx: She  has a past medical history of Acute pyelonephritis (06/29/2012), Anxiety, Asthma, Complication of anesthesia, Gallstones, GERD (gastroesophageal reflux disease), History of kidney stones, Kidney stones, Migraine, Pelvic pain in female, PONV (postoperative nausea and vomiting), Pyelonephritis (2015), Renal mass, and Tobacco abuse. Also,  has a past surgical history that includes Abdominal hysterectomy (2006); Tonsillectomy; Lithotripsy; Cholecystectomy (03/11/2011); Cholecystectomy, laparoscopic; ORIF ankle fracture (12/25/2011); Cast application (12/25/2011); Appendectomy (07/26/13); Ankle arthroscopy (Left); Ankle arthroscopy (Left, 11/24/2015); and Cystoscopy/ureteroscopy/holmium laser/stent placement (Left, 06/18/2016)., family history includes Diabetes in her father and mother; Heart  failure in her father; Hypertension in her father; Nephrolithiasis in her father; Pulmonary fibrosis in her father.,  reports that she quit smoking about 2 years ago. Her smoking use included cigarettes. She started smoking about 17 years ago. She has a 7.50 pack-year smoking history. She has never used smokeless tobacco. She reports that she drinks alcohol. She reports that she does not use drugs.  She has a current medication list which includes the following prescription(s): alprazolam, baclofen, chlorpromazine, gabapentin, ketorolac tromethamine, multivitamin with minerals, omeprazole, onabotulinumtoxina, promethazine, topiramate, UNABLE TO FIND, and valacyclovir. Also, is allergic to compazine and metoclopramide.  Review of Systems  Constitutional: Negative for chills, fever and malaise/fatigue.  HENT: Negative for congestion, sinus pain and sore throat.   Eyes: Negative for blurred vision and pain.  Respiratory: Negative for cough and wheezing.   Cardiovascular: Negative for chest pain and leg swelling.  Gastrointestinal: Negative for abdominal pain, constipation, diarrhea, heartburn, nausea and vomiting.  Genitourinary: Negative for dysuria, frequency, hematuria and urgency.  Musculoskeletal: Negative for back pain, joint pain, myalgias and neck pain.  Skin: Negative for itching and rash.  Neurological: Negative for dizziness, tremors and weakness.  Endo/Heme/Allergies: Does not bruise/bleed easily.  Psychiatric/Behavioral: Negative for depression. The patient is not nervous/anxious and does not have insomnia.    Objective: BP 100/70   Ht 5\' 7"  (1.702 m)   Wt 196 lb (88.9 kg)   BMI 30.70 kg/m  Physical Exam  Constitutional: She is oriented to person, place, and time. She appears well-developed and well-nourished. No distress.  HENT:  Head: Normocephalic and atraumatic. Head is without laceration.  Right Ear: Hearing normal.  Left Ear: Hearing normal.  Nose: No epistaxis.  No  foreign bodies.  Mouth/Throat: Uvula is midline, oropharynx is clear and moist and mucous membranes are normal.  Eyes:  Pupils are equal, round, and reactive to light.  Neck: Normal range of motion. Neck supple. No thyromegaly present.  Cardiovascular: Normal rate and regular rhythm. Exam reveals no gallop and no friction rub.  No murmur heard. Pulmonary/Chest: Effort normal and breath sounds normal. No respiratory distress. She has no wheezes. Right breast exhibits no mass, no skin change and no tenderness. Left breast exhibits no mass, no skin change and no tenderness.  Abdominal: Soft. Bowel sounds are normal. She exhibits no distension. There is no tenderness. There is no rebound.  Musculoskeletal: Normal range of motion.  Neurological: She is alert and oriented to person, place, and time. No cranial nerve deficit.  Skin: Skin is warm and dry.  Psychiatric: She has a normal mood and affect. Judgment normal.  Vitals reviewed.  Female chaperone present for pelvic portion of the physical exam  Assessment: 37 y.o. [id number] with Pelvic pain, LLQ pain, and ovarian cyst; also chronic endometriosis.  Problem List Items Addressed This Visit      Genitourinary   Cyst of ovary     Other   Endometriosis - Primary   Pelvic pain    Options discussed.  Cyst may resolve itself but the recurrent pains are more suggestive of endometriosis.  Laparoscopy can help define etiology and if endometriosis then can treat surgically +/- post op meds such as Lupron or Orilissa.  I have had a careful discussion with this patient about all the options available and the risk/benefits of each. I have fully informed this patient that surgery may subject her to a variety of discomforts and risks: She understands that most patients have surgery with little difficulty, but problems can happen ranging from minor to fatal. These include nausea, vomiting, pain, bleeding, infection, poor healing, hernia, or formation of  adhesions. Unexpected reactions may occur from any drug or anesthetic given. Unintended injury may occur to other pelvic or abdominal structures such as Fallopian tubes, ovaries, bladder, ureter (tube from kidney to bladder), or bowel. Nerves going from the pelvis to the legs may be injured. Any such injury may require immediate or later additional surgery to correct the problem. Excessive blood loss requiring transfusion is very unlikely but possible. Dangerous blood clots may form in the legs or lungs. Physical and sexual activity will be restricted in varying degrees for an indeterminate period of time but most often 2-6 weeks.  Finally, she understands that it is impossible to list every possible undesirable effect and that the condition for which surgery is done is not always cured or significantly improved, and in rare cases may be even worse.Ample time was given to answer all questions.  Annamarie MajorPaul Junaid Wurzer, MD, Merlinda FrederickFACOG [facility], [facility] 08/27/2017  3:17 PM

## 2017-08-27 NOTE — Progress Notes (Signed)
 Gynecology Pelvic Pain Evaluation   Chief Complaint: Pain  History of Present Illness:   Patient is a 37 y.o. G2P2002 who LMP was No LMP recorded. Patient has had a hysterectomy., presents today for a problem visit.  She complains of pain.   Her pain is localized to the LLQ area, described as intermittent, sharp, stabbing and ina cyclical fashion, began 2 months ago as worse pains although has had monthly pains for years (known endometriosis) and its severity is described as severe. The pain radiates to the  Non-radiating. She has these associated symptoms which include none. Patient has these modifiers which include nothing that make it better and unable to associate with any factor that make it worse.  Context includes: Pt has had known endometriosis for years; 2001 had infertility but was successful w pregnancy; then had TLH in 2006; has had recurrent ovarian cysts over the years w one laparoscopy for that.  Denies dyspareunia.  Has also had chronic constipation.  Previous evaluation: PCP and visit here with [place]. Prior Diagnosis: endometriosis in past; recent ovarian cyst by [place]. Previous Treatment: none.  PMHx: She  has a past medical history of Acute pyelonephritis (06/29/2012), Anxiety, Asthma, Complication of anesthesia, Gallstones, GERD (gastroesophageal reflux disease), History of kidney stones, Kidney stones, Migraine, Pelvic pain in female, PONV (postoperative nausea and vomiting), Pyelonephritis (2015), Renal mass, and Tobacco abuse. Also,  has a past surgical history that includes Abdominal hysterectomy (2006); Tonsillectomy; Lithotripsy; Cholecystectomy (03/11/2011); Cholecystectomy, laparoscopic; ORIF ankle fracture (12/25/2011); Cast application (12/25/2011); Appendectomy (07/26/13); Ankle arthroscopy (Left); Ankle arthroscopy (Left, 11/24/2015); and Cystoscopy/ureteroscopy/holmium laser/stent placement (Left, 06/18/2016)., family history includes Diabetes in her father and mother; Heart  failure in her father; Hypertension in her father; Nephrolithiasis in her father; Pulmonary fibrosis in her father.,  reports that she quit smoking about 2 years ago. Her smoking use included cigarettes. She started smoking about 17 years ago. She has a 7.50 pack-year smoking history. She has never used smokeless tobacco. She reports that she drinks alcohol. She reports that she does not use drugs.  She has a current medication list which includes the following prescription(s): alprazolam, baclofen, chlorpromazine, gabapentin, ketorolac tromethamine, multivitamin with minerals, omeprazole, onabotulinumtoxina, promethazine, topiramate, UNABLE TO FIND, and valacyclovir. Also, is allergic to compazine and metoclopramide.  Review of Systems  Constitutional: Negative for chills, fever and malaise/fatigue.  HENT: Negative for congestion, sinus pain and sore throat.   Eyes: Negative for blurred vision and pain.  Respiratory: Negative for cough and wheezing.   Cardiovascular: Negative for chest pain and leg swelling.  Gastrointestinal: Negative for abdominal pain, constipation, diarrhea, heartburn, nausea and vomiting.  Genitourinary: Negative for dysuria, frequency, hematuria and urgency.  Musculoskeletal: Negative for back pain, joint pain, myalgias and neck pain.  Skin: Negative for itching and rash.  Neurological: Negative for dizziness, tremors and weakness.  Endo/Heme/Allergies: Does not bruise/bleed easily.  Psychiatric/Behavioral: Negative for depression. The patient is not nervous/anxious and does not have insomnia.    Objective: BP 100/70   Ht 5' 7" (1.702 m)   Wt 196 lb (88.9 kg)   BMI 30.70 kg/m  Physical Exam  Constitutional: She is oriented to person, place, and time. She appears well-developed and well-nourished. No distress.  HENT:  Head: Normocephalic and atraumatic. Head is without laceration.  Right Ear: Hearing normal.  Left Ear: Hearing normal.  Nose: No epistaxis.  No  foreign bodies.  Mouth/Throat: Uvula is midline, oropharynx is clear and moist and mucous membranes are normal.  Eyes:   Pupils are equal, round, and reactive to light.  Neck: Normal range of motion. Neck supple. No thyromegaly present.  Cardiovascular: Normal rate and regular rhythm. Exam reveals no gallop and no friction rub.  No murmur heard. Pulmonary/Chest: Effort normal and breath sounds normal. No respiratory distress. She has no wheezes. Right breast exhibits no mass, no skin change and no tenderness. Left breast exhibits no mass, no skin change and no tenderness.  Abdominal: Soft. Bowel sounds are normal. She exhibits no distension. There is no tenderness. There is no rebound.  Musculoskeletal: Normal range of motion.  Neurological: She is alert and oriented to person, place, and time. No cranial nerve deficit.  Skin: Skin is warm and dry.  Psychiatric: She has a normal mood and affect. Judgment normal.  Vitals reviewed.  Female chaperone present for pelvic portion of the physical exam  Assessment: 37 y.o. G2P2002 with Pelvic pain, LLQ pain, and ovarian cyst; also chronic endometriosis.  Problem List Items Addressed This Visit      Genitourinary   Cyst of ovary     Other   Endometriosis - Primary   Pelvic pain    Options discussed.  Cyst may resolve itself but the recurrent pains are more suggestive of endometriosis.  Laparoscopy can help define etiology and if endometriosis then can treat surgically +/- post op meds such as Lupron or Orilissa.  I have had a careful discussion with this patient about all the options available and the risk/benefits of each. I have fully informed this patient that surgery may subject her to a variety of discomforts and risks: She understands that most patients have surgery with little difficulty, but problems can happen ranging from minor to fatal. These include nausea, vomiting, pain, bleeding, infection, poor healing, hernia, or formation of  adhesions. Unexpected reactions may occur from any drug or anesthetic given. Unintended injury may occur to other pelvic or abdominal structures such as Fallopian tubes, ovaries, bladder, ureter (tube from kidney to bladder), or bowel. Nerves going from the pelvis to the legs may be injured. Any such injury may require immediate or later additional surgery to correct the problem. Excessive blood loss requiring transfusion is very unlikely but possible. Dangerous blood clots may form in the legs or lungs. Physical and sexual activity will be restricted in varying degrees for an indeterminate period of time but most often 2-6 weeks.  Finally, she understands that it is impossible to list every possible undesirable effect and that the condition for which surgery is done is not always cured or significantly improved, and in rare cases may be even worse.Ample time was given to answer all questions.  Paul Kamile Fassler, MD, FACOG [facility], [facility] 08/27/2017  3:17 PM  

## 2017-08-27 NOTE — Telephone Encounter (Signed)
-----   Message from Nadara Mustardobert P Harris, MD sent at 08/27/2017  3:15 PM EDT ----- Regarding: surg Surgery Booking Request Patient Full Name:  [name]  MRN: [id number]  DOB: 05-Feb-1981  Surgeon: Letitia Libraobert Paul Harris, MD  Requested Surgery Date and Time: 09/02/17 Primary Diagnosis AND Code: Endometriosis, Pelvic pain, Ovarian cyst Secondary Diagnosis and Code:  Surgical Procedure: Dx Laparoscopy, Ovarian Cystectomy L&D Notification: No Admission Status: same day surgery Length of Surgery: 1 hr Special Case Needs: no H&P: no (date) Phone Interview???: yes Interpreter: Language:  Medical Clearance: no Special Scheduling Instructions: no

## 2017-08-27 NOTE — Patient Instructions (Signed)
Diagnostic Laparoscopy  A diagnostic laparoscopy is a procedure to diagnose diseases in the abdomen. During the procedure, a thin, lighted, pencil-sized instrument called a laparoscope is inserted into the abdomen through an incision. The laparoscope allows your health care provider to look at the organs inside your body.  Tell a health care provider about:   Any allergies you have.   All medicines you are taking, including vitamins, herbs, eye drops, creams, and over-the-counter medicines.   Any problems you or family members have had with anesthetic medicines.   Any blood disorders you have.   Any surgeries you have had.   Any medical conditions you have.  What are the risks?  Generally, this is a safe procedure. However, problems can occur, which may include:   Infection.   Bleeding.   Damage to other organs.   Allergic reaction to the anesthetics used during the procedure.    What happens before the procedure?   Do not eat or drink anything after midnight on the night before the procedure or as directed by your health care provider.   Ask your health care provider about:  ? Changing or stopping your regular medicines.  ? Taking medicines such as aspirin and ibuprofen. These medicines can thin your blood. Do not take these medicines before your procedure if your health care provider instructs you not to.   Plan to have someone take you home after the procedure.  What happens during the procedure?   You may be given a medicine to help you relax (sedative).   You will be given a medicine to make you sleep (general anesthetic).   Your abdomen will be inflated with a gas. This will make your organs easier to see.   Small incisions will be made in your abdomen.   A laparoscope and other small instruments will be inserted into the abdomen through the incisions.   A tissue sample may be removed from an organ in the abdomen for examination.   The instruments will be removed from the abdomen.   The  gas will be released.   The incisions will be closed with stitches (sutures).  What happens after the procedure?  Your blood pressure, heart rate, breathing rate, and blood oxygen level will be monitored often until the medicines you were given have worn off.  This information is not intended to replace advice given to you by your health care provider. Make sure you discuss any questions you have with your health care provider.  Document Released: 05/20/2000 Document Revised: 06/22/2015 Document Reviewed: 09/24/2013  Elsevier Interactive Patient Education  2018 [employer]

## 2017-08-29 ENCOUNTER — Encounter
Admission: RE | Admit: 2017-08-29 | Discharge: 2017-08-29 | Disposition: A | Discharge status: Home / Self Care | Payer: BLUE CROSS/BLUE SHIELD | Source: Ambulatory Visit | Attending: Obstetrics & Gynecology | Admitting: Obstetrics & Gynecology

## 2017-08-29 ENCOUNTER — Other Ambulatory Visit: Payer: Self-pay

## 2017-08-29 NOTE — Patient Instructions (Addendum)
Your procedure is scheduled on: 09-02-17 TUESDAY Report to Same Day Surgery 2nd floor medical mall [facility](Medical Mall Entrance-take elevator on left to 2nd floor.  Check in with surgery information desk.) To find out your arrival time please call [phone] between 1PM - 3PM on 09-01-17 MONDAY  Remember: Instructions that are not followed completely may result in serious medical risk, up to and including death, or upon the discretion of your surgeon and anesthesiologist your surgery may need to be rescheduled.    _x___ 1. Do not eat food after midnight the night before your procedure. NO GUM OR CANDY AFTER MIDNIGHT.  You may drink clear liquids up to 2 hours before you are scheduled to arrive at the hospital for your procedure.  Do not drink clear liquids within 2 hours of your scheduled arrival to the hospital.  Clear liquids include  --Water or Apple juice without pulp  --Clear carbohydrate beverage such as ClearFast or Gatorade  --Black Coffee or Clear Tea (No milk, no creamers, do not add anything to the coffee or Tea    __x__ 2. No Alcohol for 24 hours before or after surgery.   __x__3. No Smoking or e-cigarettes for 24 prior to surgery.  Do not use any chewable tobacco products for at least 6 hour prior to surgery   ____  4. Bring all medications with you on the day of surgery if instructed.    __x__ 5. Notify your doctor if there is any change in your medical condition     (cold, fever, infections).    x___6. On the morning of surgery brush your teeth with toothpaste and water.  You may rinse your mouth with mouth wash if you wish.  Do not swallow any toothpaste or mouthwash.   Do not wear jewelry, make-up, hairpins, clips or nail polish.  Do not wear lotions, powders, or perfumes. You may wear deodorant.  Do not shave 48 hours prior to surgery. Men may shave face and neck.  Do not bring valuables to the hospital.    [facility] is not responsible for any belongings or  valuables.               Contacts, dentures or bridgework may not be worn into surgery.  Leave your suitcase in the car. After surgery it may be brought to your room.  For patients admitted to the hospital, discharge time is determined by your treatment team.  _  Patients discharged the day of surgery will not be allowed to drive home.  You will need someone to drive you home and stay with you the night of your procedure.    Please read over the following fact sheets that you were given:   [facility] Preparing [facility]   _x___ TAKE THE FOLLOWING MEDICATION THE MORNING OF SURGERY WITH A SMALL SIP OF WATER. These include:  1. PRILOSEC (OMEPRAZOLE)  2. TAKE AN EXTRA PRILOSEC THE NIGHT BEFORE YOUR SURGERY  3. YOU MAY TAKE A XANAX DAY OF SURGERY IF NEEDED  4.  5.  6.  ____Fleets enema or Magnesium Citrate as directed.   _x___ Use CHG Soap or sage wipes as directed on instruction sheet   ____ Use inhalers on the day of surgery and bring to hospital day of surgery  ____ Stop Metformin and Janumet 2 days prior to surgery.    ____ Take 1/2 of usual insulin dose the night before surgery and none on the morning surgery.   ____ Follow recommendations  from Cardiologist, Pulmonologist or PCP regarding stopping Aspirin, Coumadin, Plavix ,Eliquis, Effient, or Pradaxa, and Pletal.  X____Stop Anti-inflammatories such as Advil, Aleve, Ibuprofen, Motrin, Naproxen, Naprosyn, Goodies powders, EXCEDRIN MIGRAINE or aspirin products NOW-OK to take Tylenol OR PERCOCET IF NEEDED   ____ Stop supplements until after surgery.    ____ Bring C-Pap to the hospital.

## 2017-09-01 ENCOUNTER — Encounter
Admission: RE | Admit: 2017-09-01 | Discharge: 2017-09-01 | Disposition: A | Discharge status: Home / Self Care | Payer: BLUE CROSS/BLUE SHIELD | Source: Ambulatory Visit | Attending: Obstetrics & Gynecology | Admitting: Obstetrics & Gynecology

## 2017-09-01 DIAGNOSIS — Z01812 Encounter for preprocedural laboratory examination: Secondary | ICD-10-CM | POA: Diagnosis not present

## 2017-09-01 LAB — TYPE AND SCREEN
ABO/RH(D): O POS
Antibody Screen: NEGATIVE

## 2017-09-02 ENCOUNTER — Ambulatory Visit
Admission: RE | Admit: 2017-09-02 | Discharge: 2017-09-02 | Disposition: A | Discharge status: Home / Self Care | Payer: BLUE CROSS/BLUE SHIELD | Source: Ambulatory Visit | Attending: Obstetrics & Gynecology | Admitting: Obstetrics & Gynecology

## 2017-09-02 ENCOUNTER — Other Ambulatory Visit: Payer: Self-pay

## 2017-09-02 ENCOUNTER — Ambulatory Visit: Payer: BLUE CROSS/BLUE SHIELD | Admitting: Anesthesiology

## 2017-09-02 ENCOUNTER — Encounter
Admission: RE | Disposition: A | Discharge status: Home / Self Care | Payer: Self-pay | Source: Ambulatory Visit | Attending: Obstetrics & Gynecology

## 2017-09-02 DIAGNOSIS — Z87891 Personal history of nicotine dependence: Secondary | ICD-10-CM | POA: Diagnosis not present

## 2017-09-02 DIAGNOSIS — N83201 Unspecified ovarian cyst, right side: Secondary | ICD-10-CM | POA: Diagnosis not present

## 2017-09-02 DIAGNOSIS — K219 Gastro-esophageal reflux disease without esophagitis: Secondary | ICD-10-CM | POA: Diagnosis not present

## 2017-09-02 DIAGNOSIS — Z79899 Other long term (current) drug therapy: Secondary | ICD-10-CM | POA: Insufficient documentation

## 2017-09-02 DIAGNOSIS — F419 Anxiety disorder, unspecified: Secondary | ICD-10-CM | POA: Insufficient documentation

## 2017-09-02 DIAGNOSIS — J45909 Unspecified asthma, uncomplicated: Secondary | ICD-10-CM | POA: Diagnosis not present

## 2017-09-02 DIAGNOSIS — N8311 Corpus luteum cyst of right ovary: Secondary | ICD-10-CM | POA: Insufficient documentation

## 2017-09-02 DIAGNOSIS — R102 Pelvic and perineal pain: Secondary | ICD-10-CM | POA: Diagnosis present

## 2017-09-02 DIAGNOSIS — N83209 Unspecified ovarian cyst, unspecified side: Secondary | ICD-10-CM | POA: Diagnosis present

## 2017-09-02 DIAGNOSIS — N809 Endometriosis, unspecified: Secondary | ICD-10-CM | POA: Diagnosis not present

## 2017-09-02 HISTORY — PX: LAPAROSCOPIC OVARIAN CYSTECTOMY: SHX6248

## 2017-09-02 LAB — CBC
HCT: 39 % (ref 35.0–47.0)
Hemoglobin: 13.6 g/dL (ref 12.0–16.0)
MCH: 32.3 pg (ref 26.0–34.0)
MCHC: 34.7 g/dL (ref 32.0–36.0)
MCV: 93 fL (ref 80.0–100.0)
Platelets: 208 10*3/uL (ref 150–440)
RBC: 4.2 MIL/uL (ref 3.80–5.20)
RDW: 13.2 % (ref 11.5–14.5)
WBC: 6.8 10*3/uL (ref 3.6–11.0)

## 2017-09-02 LAB — ABO/RH: ABO/RH(D): O POS

## 2017-09-02 SURGERY — EXCISION, CYST, OVARY, LAPAROSCOPIC
Anesthesia: General | Laterality: Right | Wound class: Clean Contaminated

## 2017-09-02 MED ORDER — ONDANSETRON HCL 4 MG/2ML IJ SOLN
INTRAMUSCULAR | Status: DC | PRN
  Administered 2017-09-02: 4 mg via INTRAVENOUS

## 2017-09-02 MED ORDER — PROMETHAZINE HCL 25 MG/ML IJ SOLN: 6.2500 mg | INTRAMUSCULAR | Status: DC | PRN

## 2017-09-02 MED ORDER — BUPIVACAINE HCL (PF) 0.5 % IJ SOLN
INTRAMUSCULAR | Status: AC
  Filled 2017-09-02: qty 30

## 2017-09-02 MED ORDER — FENTANYL CITRATE (PF) 100 MCG/2ML IJ SOLN
INTRAMUSCULAR | Status: DC | PRN
  Administered 2017-09-02 (×2): 100 ug via INTRAVENOUS

## 2017-09-02 MED ORDER — MORPHINE SULFATE (PF) 4 MG/ML IV SOLN: 1.0000 mg | INTRAVENOUS | Status: DC | PRN

## 2017-09-02 MED ORDER — OXYCODONE-ACETAMINOPHEN 5-325 MG PO TABS: 1.0000 | ORAL_TABLET | ORAL | 0 refills | Status: DC | PRN

## 2017-09-02 MED ORDER — EPHEDRINE SULFATE 50 MG/ML IJ SOLN
INTRAMUSCULAR | Status: DC | PRN
  Administered 2017-09-02 (×2): 10 mg via INTRAVENOUS

## 2017-09-02 MED ORDER — BUPIVACAINE HCL (PF) 0.5 % IJ SOLN
INTRAMUSCULAR | Status: DC | PRN
  Administered 2017-09-02: 11 mL

## 2017-09-02 MED ORDER — FENTANYL CITRATE (PF) 100 MCG/2ML IJ SOLN
INTRAMUSCULAR | Status: AC
  Filled 2017-09-02: qty 2

## 2017-09-02 MED ORDER — LACTATED RINGERS IV SOLN: INTRAVENOUS | Status: DC

## 2017-09-02 MED ORDER — LIDOCAINE HCL (CARDIAC) PF 100 MG/5ML IV SOSY
PREFILLED_SYRINGE | INTRAVENOUS | Status: DC | PRN
  Administered 2017-09-02: 60 mg via INTRAVENOUS

## 2017-09-02 MED ORDER — SCOPOLAMINE 1 MG/3DAYS TD PT72
1.0000 | MEDICATED_PATCH | Freq: Once | TRANSDERMAL | Status: DC
  Administered 2017-09-02: 1.5 mg via TRANSDERMAL

## 2017-09-02 MED ORDER — SUGAMMADEX SODIUM 500 MG/5ML IV SOLN
INTRAVENOUS | Status: DC | PRN
  Administered 2017-09-02: 177.8 mg via INTRAVENOUS

## 2017-09-02 MED ORDER — OXYCODONE-ACETAMINOPHEN 5-325 MG PO TABS: 1.0000 | ORAL_TABLET | ORAL | Status: DC | PRN

## 2017-09-02 MED ORDER — ACETAMINOPHEN 325 MG PO TABS: 650.0000 mg | ORAL_TABLET | ORAL | Status: DC | PRN

## 2017-09-02 MED ORDER — MIDAZOLAM HCL 2 MG/2ML IJ SOLN
INTRAMUSCULAR | Status: AC
  Filled 2017-09-02: qty 2

## 2017-09-02 MED ORDER — SCOPOLAMINE 1 MG/3DAYS TD PT72
MEDICATED_PATCH | TRANSDERMAL | Status: AC
  Filled 2017-09-02: qty 1

## 2017-09-02 MED ORDER — FENTANYL CITRATE (PF) 100 MCG/2ML IJ SOLN
25.0000 ug | INTRAMUSCULAR | Status: DC | PRN
  Administered 2017-09-02 (×3): 50 ug via INTRAVENOUS

## 2017-09-02 MED ORDER — KETOROLAC TROMETHAMINE 30 MG/ML IJ SOLN
30.0000 mg | Freq: Four times a day (QID) | INTRAMUSCULAR | Status: DC
  Filled 2017-09-02: qty 1

## 2017-09-02 MED ORDER — SUGAMMADEX SODIUM 200 MG/2ML IV SOLN
INTRAVENOUS | Status: AC
  Filled 2017-09-02: qty 2

## 2017-09-02 MED ORDER — ACETAMINOPHEN NICU IV SYRINGE 10 MG/ML
INTRAVENOUS | Status: AC
  Filled 2017-09-02: qty 1

## 2017-09-02 MED ORDER — PROPOFOL 10 MG/ML IV BOLUS
INTRAVENOUS | Status: DC | PRN
  Administered 2017-09-02: 150 mg via INTRAVENOUS

## 2017-09-02 MED ORDER — SUCCINYLCHOLINE CHLORIDE 20 MG/ML IJ SOLN
INTRAMUSCULAR | Status: AC
  Filled 2017-09-02: qty 1

## 2017-09-02 MED ORDER — EPHEDRINE SULFATE 50 MG/ML IJ SOLN
INTRAMUSCULAR | Status: AC
  Filled 2017-09-02: qty 1

## 2017-09-02 MED ORDER — DEXAMETHASONE SODIUM PHOSPHATE 10 MG/ML IJ SOLN
INTRAMUSCULAR | Status: DC | PRN
  Administered 2017-09-02: 10 mg via INTRAVENOUS

## 2017-09-02 MED ORDER — MIDAZOLAM HCL 2 MG/2ML IJ SOLN
INTRAMUSCULAR | Status: DC | PRN
  Administered 2017-09-02: 2 mg via INTRAVENOUS

## 2017-09-02 MED ORDER — LACTATED RINGERS IV SOLN
INTRAVENOUS | Status: DC
  Administered 2017-09-02: 09:00:00 via INTRAVENOUS

## 2017-09-02 MED ORDER — ROCURONIUM BROMIDE 100 MG/10ML IV SOLN
INTRAVENOUS | Status: DC | PRN
  Administered 2017-09-02: 15 mg via INTRAVENOUS
  Administered 2017-09-02: 30 mg via INTRAVENOUS

## 2017-09-02 MED ORDER — ACETAMINOPHEN 10 MG/ML IV SOLN
INTRAVENOUS | Status: DC | PRN
  Administered 2017-09-02: 1000 mg via INTRAVENOUS

## 2017-09-02 MED ORDER — PROPOFOL 10 MG/ML IV BOLUS
INTRAVENOUS | Status: AC
  Filled 2017-09-02: qty 20

## 2017-09-02 MED ORDER — ACETAMINOPHEN 650 MG RE SUPP
650.0000 mg | RECTAL | Status: DC | PRN
  Filled 2017-09-02: qty 1

## 2017-09-02 SURGICAL SUPPLY — 37 items
BLADE SURG SZ11 CARB STEEL (BLADE) ×2 IMPLANT
CANISTER SUCT 1200ML W/VALVE (MISCELLANEOUS) ×2 IMPLANT
CATH ROBINSON RED A/P 16FR (CATHETERS) ×2 IMPLANT
CHLORAPREP W/TINT 26ML (MISCELLANEOUS) ×2 IMPLANT
DERMABOND ADVANCED (GAUZE/BANDAGES/DRESSINGS) ×1
DERMABOND ADVANCED .7 DNX12 (GAUZE/BANDAGES/DRESSINGS) ×1 IMPLANT
DRSG TELFA 4X3 1S NADH ST (GAUZE/BANDAGES/DRESSINGS) IMPLANT
GLOVE BIO SURGEON STRL SZ8 (GLOVE) ×2 IMPLANT
GLOVE INDICATOR 8.0 STRL GRN (GLOVE) ×2 IMPLANT
GOWN STRL REUS W/ TWL LRG LVL3 (GOWN DISPOSABLE) ×1 IMPLANT
GOWN STRL REUS W/ TWL XL LVL3 (GOWN DISPOSABLE) ×1 IMPLANT
GOWN STRL REUS W/TWL LRG LVL3 (GOWN DISPOSABLE) ×1
GOWN STRL REUS W/TWL XL LVL3 (GOWN DISPOSABLE) ×1
GRASPER SUT TROCAR 14GX15 (MISCELLANEOUS) ×2 IMPLANT
IRRIGATION STRYKERFLOW (MISCELLANEOUS) IMPLANT
IRRIGATOR STRYKERFLOW (MISCELLANEOUS)
IV LACTATED RINGERS 1000ML (IV SOLUTION) IMPLANT
KIT PINK PAD W/HEAD ARE REST (MISCELLANEOUS) ×2
KIT PINK PAD W/HEAD ARM REST (MISCELLANEOUS) ×1 IMPLANT
LABEL OR SOLS (LABEL) ×2 IMPLANT
NEEDLE VERESS 14GA 120MM (NEEDLE) ×2 IMPLANT
NS IRRIG 500ML POUR BTL (IV SOLUTION) ×2 IMPLANT
PACK GYN LAPAROSCOPIC (MISCELLANEOUS) ×2 IMPLANT
PAD PREP 24X41 OB/GYN DISP (PERSONAL CARE ITEMS) ×2 IMPLANT
POUCH SPECIMEN RETRIEVAL 10MM (ENDOMECHANICALS) IMPLANT
SCISSORS METZENBAUM CVD 33 (INSTRUMENTS) ×2 IMPLANT
SHEARS HARMONIC ACE PLUS 36CM (ENDOMECHANICALS) IMPLANT
SLEEVE ENDOPATH XCEL 5M (ENDOMECHANICALS) ×4 IMPLANT
SPONGE GAUZE 2X2 8PLY STRL LF (GAUZE/BANDAGES/DRESSINGS) IMPLANT
STRAP SAFETY 5IN WIDE (MISCELLANEOUS) ×2 IMPLANT
SUT VIC AB 0 CT1 36 (SUTURE) ×2 IMPLANT
SUT VIC AB 2-0 UR6 27 (SUTURE) IMPLANT
SUT VIC AB 4-0 PS2 18 (SUTURE) IMPLANT
SYR 10ML LL (SYRINGE) ×2 IMPLANT
TROCAR ENDO BLADELESS 11MM (ENDOMECHANICALS) IMPLANT
TROCAR XCEL NON-BLD 5MMX100MML (ENDOMECHANICALS) ×2 IMPLANT
TUBING INSUFFLATION (TUBING) ×2 IMPLANT

## 2017-09-02 NOTE — Anesthesia Postprocedure Evaluation (Signed)
Anesthesia Post Note  Patient: Ethelle LyonRebecca M Weseman  Procedure(s) Performed: LAPAROSCOPIC OVARIAN CYSTECTOMY (Right )  Patient location during evaluation: PACU Anesthesia Type: General Level of consciousness: awake and alert Pain management: pain level controlled Vital Signs Assessment: post-procedure vital signs reviewed and stable Respiratory status: spontaneous breathing, nonlabored ventilation, respiratory function stable and patient connected to nasal cannula oxygen Cardiovascular status: blood pressure returned to baseline and stable Postop Assessment: no apparent nausea or vomiting Anesthetic complications: no     Last Vitals:  Vitals:   09/02/17 1126 09/02/17 1150  BP: 96/62 101/60  Pulse: 64 61  Resp: 14 16  Temp: (!) 36.2 C   SpO2: 100% 100%    Last Pain:  Vitals:   09/02/17 1126  TempSrc: Temporal  PainSc: 5                  Lenard SimmerAndrew Montgomery Favor

## 2017-09-02 NOTE — Transfer of Care (Signed)
Immediate Anesthesia Transfer of Care Note  Patient: Sophia LyonRebecca M Kitzmiller  Procedure(s) Performed: LAPAROSCOPIC OVARIAN CYSTECTOMY (Right )  Patient Location: PACU  Anesthesia Type:General  Level of Consciousness: awake and alert   Airway & Oxygen Therapy: Patient Spontanous Breathing and Patient connected to face mask oxygen  Post-op Assessment: Report given to RN and Post -op Vital signs reviewed and stable  Post vital signs: Reviewed and stable  Last Vitals:  Vitals Value Taken Time  BP 115/70 09/02/2017 10:34 AM  Temp 36.5 C 09/02/2017 10:34 AM  Pulse 84 09/02/2017 10:36 AM  Resp 12 09/02/2017 10:36 AM  SpO2 100 % 09/02/2017 10:36 AM  Vitals shown include unvalidated device data.  Last Pain:  Vitals:   09/02/17 1034  TempSrc:   PainSc: (P) Asleep         Complications: No apparent anesthesia complications

## 2017-09-02 NOTE — Anesthesia Preprocedure Evaluation (Addendum)
Anesthesia Evaluation  Patient identified by MRN, date of birth, ID band Patient awake    Reviewed: Allergy & Precautions, H&P , NPO status , Patient's Chart, lab work & pertinent test results, reviewed documented beta blocker date and time   History of Anesthesia Complications (+) PONV and history of anesthetic complications  Airway Mallampati: I  TM Distance: >3 FB Neck ROM: full    Dental  (+) Dental Advidsory Given, Teeth Intact, Missing   Pulmonary neg shortness of breath, asthma (as a child) , neg recent URI, former smoker,           Cardiovascular Exercise Tolerance: Good negative cardio ROS       Neuro/Psych PSYCHIATRIC DISORDERS Anxiety negative neurological ROS     GI/Hepatic Neg liver ROS, GERD  ,  Endo/Other  negative endocrine ROS  Renal/GU Renal disease (kidney stones)  negative genitourinary   Musculoskeletal   Abdominal   Peds  Hematology negative hematology ROS (+)   Anesthesia Other Findings Past Medical History: 06/29/2012: Acute pyelonephritis No date: Anxiety No date: Asthma     Comment:  AS A CHILD No date: Complication of anesthesia     Comment:  TROUBLE URINATING IN THE PAST No date: Gallstones No date: GERD (gastroesophageal reflux disease) No date: History of kidney stones No date: Kidney stones     Comment:  bilateral No date: Migraine No date: Pelvic pain in female No date: PONV (postoperative nausea and vomiting)     Comment:  WITH HYSTERECTOMY ONLY 2015: Pyelonephritis No date: Renal mass     Comment:  right No date: Tobacco abuse   Reproductive/Obstetrics negative OB ROS                            Anesthesia Physical Anesthesia Plan  ASA: II  Anesthesia Plan: General   Post-op Pain Management:    Induction: Intravenous  PONV Risk Score and Plan: 4 or greater and Ondansetron, Dexamethasone, Midazolam and Scopolamine patch -  Pre-op  Airway Management Planned: Oral ETT  Additional Equipment:   Intra-op Plan:   Post-operative Plan: Extubation in OR  Informed Consent: I have reviewed the patients History and Physical, chart, labs and discussed the procedure including the risks, benefits and alternatives for the proposed anesthesia with the patient or authorized representative who has indicated his/her understanding and acceptance.   Dental Advisory Given  Plan Discussed with: Anesthesiologist, CRNA and Surgeon  Anesthesia Plan Comments:         Anesthesia Quick Evaluation

## 2017-09-02 NOTE — Anesthesia Procedure Notes (Signed)
Procedure Name: Intubation Date/Time: 09/02/2017 9:30 AM Performed by: Hedda Slade, CRNA Pre-anesthesia Checklist: Patient identified, Patient being monitored, Timeout performed, Emergency Drugs available and Suction available Patient Re-evaluated:Patient Re-evaluated prior to induction Oxygen Delivery Method: Circle system utilized Preoxygenation: Pre-oxygenation with 100% oxygen Induction Type: IV induction Ventilation: Mask ventilation without difficulty Laryngoscope Size: Mac and 3 Grade View: Grade I Tube type: Oral Tube size: 7.0 mm Number of attempts: 1 Airway Equipment and Method: Stylet Placement Confirmation: ETT inserted through vocal cords under direct vision,  positive ETCO2 and breath sounds checked- equal and bilateral Secured at: 21 cm Tube secured with: Tape Dental Injury: Teeth and Oropharynx as per pre-operative assessment

## 2017-09-02 NOTE — Discharge Instructions (Signed)
General Gynecological Post-Operative Instructions °You may expect to feel dizzy, weak, and drowsy for as long as 24 hours after receiving the medicine that made you sleep (anesthetic).  °Do not drive a car, ride a bicycle, participate in physical activities, or take public transportation until you are done taking narcotic pain medicines or as directed by your doctor.  °Do not drink alcohol or take tranquilizers.  °Do not take medicine that has not been prescribed by your doctor.  °Do not sign important papers or make important decisions while on narcotic pain medicines.  °Have a responsible person with you.  °CARE OF INCISION  °Keep incision clean and dry. °Take showers instead of baths until your doctor gives you permission to take baths.  °Avoid heavy lifting (more than 10 pounds/4.5 kilograms), pushing, or pulling.  °Avoid activities that may risk injury to your surgical site.  °No sexual intercourse or placement of anything in the vagina for 1 weeks or as instructed by your doctor. °If you have tubes coming from the wound site, check with your doctor regarding appropriate care of the tubes. °Only take prescription or over-the-counter medicines  for pain, discomfort, or fever as directed by your doctor. Do not take aspirin. It can make you bleed. Take medicines (antibiotics) that kill germs if they are prescribed for you.  °Call the office or go to the ER if:  °You feel sick to your stomach (nauseous) and you start to throw up (vomit).  °You have trouble eating or drinking.  °You have an oral temperature above 101.  °You have constipation that is not helped by adjusting diet or increasing fluid intake. Pain medicines are a common cause of constipation.  °You have any other concerns. °SEEK IMMEDIATE MEDICAL CARE IF:  °You have persistent dizziness.  °You have difficulty breathing or a congested sounding (croupy) cough.  °You have an oral temperature above 102.5, not controlled by medicine.  °There is increasing  pain or tenderness near or in the surgical site.  ° ° ° °

## 2017-09-02 NOTE — Op Note (Signed)
  Operative Note   09/02/2017  PRE-OP DIAGNOSIS: Right Ovarian Cyst, Pelvic Pain   POST-OP DIAGNOSIS: same   PROCEDURE: Procedure(s): LAPAROSCOPIC OVARIAN CYSTECTOMY   SURGEON: Annamarie MajorPaul Tyrus Wilms, MD, FACOG  ANESTHESIA: Choice   ESTIMATED BLOOD LOSS: Minimal  COMPLICATIONS: None  DISPOSITION: PACU - hemodynamically stable.  CONDITION: stable  FINDINGS: Laparoscopic survey of the abdomen revealed a grossly normal tubes, ovaries, liver edge, gallbladder edge, Minimal intra-abdominal adhesions were noted. Small CL Right Ovarian Cyst noted.  Absent uterus and appendix. There was no visible evidence for endometriosis or adhesive disease, particularly in the LLQ where her pre-op pain was isolated.  PROCEDURE IN DETAIL: The patient was taken to the OR where anesthesia was administed. The patient was positioned in the supine position. A foley catheter was placed.   Attention was turned to the patient's abdomen where a 5 mm skin incision was made in the umbilical fold, after injection of local anesthesia. The Veress step needle was carefully introduced into the peritoneal cavity with placement confirmed using the hanging drop technique.  Pneumoperitoneum was obtained. The 5 mm port was then placed under direct visualization with the operative laparoscope.  Trendelenburg positioning.  Additional 5mm trocar was then placed in the RLQ lateral to the inferior epigastric blood vessels under direct visualization with the laparoscope.  Instrumentation to visualize complete pelvic anatomy performed.  A 5mm trocar was also then placed in the suprapubic region.  The ovarian cyst is identified and stabilized.  An incision is made with scant clear fluid noted.  Fluid is aspirated.  Cyst wall is dissected free from the ovarian cortex and removed.  Hemostasis is visualized and assured.  Contralateral ovary seen as normal.  Pelvic cavity is cleaned with any fluid aspirated.  Instruments and trocars removed, gas  expelled, and skin closed with skin adhesive glue.  Instrument, needle, and sponge counts correct x2 at the conclusion of the case.  Pt goes to recovery room in stable condition.  Annamarie MajorPaul Tierre Gerard, MD, Merlinda FrederickFACOG [facility], [facility] 09/02/2017  10:23 AM

## 2017-09-02 NOTE — Interval H&P Note (Signed)
History and Physical Interval Note:  09/02/2017 8:57 AM  [name]  has presented today for surgery, with the diagnosis of endometriosis, pelvic pain, ovarian cyst  The various methods of treatment have been discussed with the patient and family. After consideration of risks, benefits and other options for treatment, the patient has consented to  Procedure(s): LAPAROSCOPIC OVARIAN CYSTECTOMY (N/A) as a surgical intervention .  The patient's history has been reviewed, patient examined, no change in status, stable for surgery.  I have reviewed the patient's chart and labs.  Questions were answered to the patient's satisfaction.     [name]

## 2017-09-02 NOTE — Anesthesia Post-op Follow-up Note (Signed)
Anesthesia QCDR form completed.        

## 2017-09-03 LAB — SURGICAL PATHOLOGY

## 2017-09-04 ENCOUNTER — Other Ambulatory Visit: Payer: Self-pay | Admitting: Physician Assistant

## 2017-09-04 DIAGNOSIS — F411 Generalized anxiety disorder: Secondary | ICD-10-CM

## 2017-09-04 DIAGNOSIS — F5101 Primary insomnia: Secondary | ICD-10-CM

## 2017-09-05 ENCOUNTER — Encounter: Payer: Self-pay | Admitting: Obstetrics & Gynecology

## 2017-09-05 ENCOUNTER — Telehealth: Payer: Self-pay

## 2017-09-05 ENCOUNTER — Ambulatory Visit (INDEPENDENT_AMBULATORY_CARE_PROVIDER_SITE_OTHER): Payer: BLUE CROSS/BLUE SHIELD | Admitting: Obstetrics & Gynecology

## 2017-09-05 VITALS — BP 104/68 | HR 65 | Ht 67.0 in | Wt 201.0 lb

## 2017-09-05 DIAGNOSIS — Z9889 Other specified postprocedural states: Secondary | ICD-10-CM

## 2017-09-05 DIAGNOSIS — R102 Pelvic and perineal pain: Secondary | ICD-10-CM

## 2017-09-05 NOTE — Telephone Encounter (Signed)
OK to work in for me to see

## 2017-09-05 NOTE — Telephone Encounter (Signed)
Patient is schedule 09/05/17 with RPH °

## 2017-09-05 NOTE — Telephone Encounter (Signed)
Please advise 

## 2017-09-05 NOTE — Telephone Encounter (Signed)
Pt called triage line stating [facility]. She has had an incident where the incision above the pubic area is bleeding and has a sm.opening. She is wondering if she just needs to cover it or be seen. CB# [phone]  If patient needs to be worked in please let Huntley DecSara know. Thanks

## 2017-09-05 NOTE — Progress Notes (Signed)
  Postoperative Follow-up Patient presents post op from laparoscopy for pelvic pain, 1 week ago.  Subjective: Patient reports some improvement in her preop symptoms. Eating a regular diet without difficulty. The patient is not having any pain.  Activity: normal activities of daily living. Patient reports vaginal sx's of No bleeding.  She reports incision concern after exertional activity today, some separation and oozing.  Objective: BP 104/68   Pulse 65   Ht 5\' 7"  (1.702 m)   Wt 201 lb (91.2 kg)   BMI 31.48 kg/m  Physical Exam  Constitutional: She is oriented to person, place, and time. She appears well-developed and well-nourished. No distress.  Cardiovascular: Normal rate.  Pulmonary/Chest: Effort normal.  Abdominal: Soft. She exhibits no distension. There is no tenderness.  Incision w separation at suprapubic site   Musculoskeletal: Normal range of motion.  Neurological: She is alert and oriented to person, place, and time. No cranial nerve deficit.  Skin: Skin is warm and dry.  Psychiatric: She has a normal mood and affect.    Assessment: s/p :  laparoscopy and right ovarian cystectomy stable  Plan: Patient has done well after surgery with no apparent complications.  I have discussed the post-operative course to date, and the expected progress moving forward.  The patient understands what complications to be concerned about.  I will see the patient in routine follow up, or sooner if needed.    Activity plan: No restriction.  Steristrip applied to incision.  Wound care discussed  Surgery findings discussed    Next step is GI or Pelvic Floor PT    Monitor to see if pain improves first    Reassured that no endometriosis was found  [name] 09/05/2017, 2:16 PM

## 2017-09-08 ENCOUNTER — Encounter: Payer: Self-pay | Admitting: Physician Assistant

## 2017-09-08 DIAGNOSIS — R1084 Generalized abdominal pain: Secondary | ICD-10-CM

## 2017-09-18 ENCOUNTER — Ambulatory Visit: Payer: BLUE CROSS/BLUE SHIELD | Admitting: Obstetrics & Gynecology

## 2017-09-24 ENCOUNTER — Encounter: Payer: Self-pay | Admitting: Physician Assistant

## 2017-09-24 DIAGNOSIS — G2581 Restless legs syndrome: Secondary | ICD-10-CM

## 2017-09-25 MED ORDER — GABAPENTIN 400 MG PO CAPS: 400.0000 mg | ORAL_CAPSULE | Freq: Every day | ORAL | 0 refills | Status: DC

## 2017-10-06 ENCOUNTER — Other Ambulatory Visit: Payer: Self-pay | Admitting: Physician Assistant

## 2017-10-07 ENCOUNTER — Ambulatory Visit: Payer: BLUE CROSS/BLUE SHIELD | Admitting: Physician Assistant

## 2017-10-07 ENCOUNTER — Encounter: Payer: Self-pay | Admitting: Physician Assistant

## 2017-10-07 ENCOUNTER — Ambulatory Visit
Admission: RE | Admit: 2017-10-07 | Discharge: 2017-10-07 | Disposition: A | Discharge status: Home / Self Care | Payer: BLUE CROSS/BLUE SHIELD | Source: Ambulatory Visit | Attending: Physician Assistant | Admitting: Physician Assistant

## 2017-10-07 VITALS — BP 110/70 | HR 67 | Temp 97.7°F | Resp 16 | Wt 199.2 lb

## 2017-10-07 DIAGNOSIS — R1032 Left lower quadrant pain: Secondary | ICD-10-CM

## 2017-10-07 DIAGNOSIS — R110 Nausea: Secondary | ICD-10-CM | POA: Diagnosis not present

## 2017-10-07 DIAGNOSIS — R112 Nausea with vomiting, unspecified: Secondary | ICD-10-CM | POA: Diagnosis not present

## 2017-10-07 DIAGNOSIS — N200 Calculus of kidney: Secondary | ICD-10-CM | POA: Diagnosis not present

## 2017-10-07 DIAGNOSIS — Z87442 Personal history of urinary calculi: Secondary | ICD-10-CM

## 2017-10-07 DIAGNOSIS — R1110 Vomiting, unspecified: Secondary | ICD-10-CM | POA: Diagnosis not present

## 2017-10-07 LAB — POCT URINALYSIS DIPSTICK
Bilirubin, UA: NEGATIVE
Glucose, UA: NEGATIVE
Ketones, UA: NEGATIVE
Leukocytes, UA: NEGATIVE
Nitrite, UA: NEGATIVE
Protein, UA: NEGATIVE
Spec Grav, UA: 1.02 (ref 1.010–1.025)
Urobilinogen, UA: 0.2 E.U./dL
pH, UA: 6 (ref 5.0–8.0)

## 2017-10-07 MED ORDER — IOHEXOL 300 MG/ML  SOLN
100.0000 mL | Freq: Once | INTRAMUSCULAR | Status: AC | PRN
  Administered 2017-10-07: 100 mL via INTRAVENOUS

## 2017-10-07 MED ORDER — PROMETHAZINE HCL 25 MG PO TABS: 25.0000 mg | ORAL_TABLET | Freq: Three times a day (TID) | ORAL | 0 refills | Status: DC | PRN

## 2017-10-07 NOTE — Progress Notes (Signed)
Patient: [name] Female    DOB: 08-31-1980   37 y.o.   MRN: [id number] Visit Date: 10/07/2017  Today's Provider: [name], PA-C   No chief complaint on file.  Subjective:    Abdominal Pain  This is a new (New episode. Reports that she has seen Adriana for the abdominal pain. She reports that this abdominal pain started since May of this year.) problem. The current episode started in the past 7 days (Started Sunday). The problem occurs daily. The problem has been gradually worsening. The pain is located in the LLQ ("Towards the center"). The pain is at a severity of 7/10. The pain is moderate. The quality of the pain is colicky and tearing. The abdominal pain radiates to the back (Lower back). Associated symptoms include constipation (at baseline, but improved with Miralax), nausea and vomiting. Pertinent negatives include no diarrhea, fever, frequency or hematuria. Associated symptoms comments: "poor appetite". The pain is aggravated by vomiting. The pain is relieved by nothing. Treatments tried: Pain medicine and Aleve, heating pads.Reports that she Hysterectomy in 2006. The treatment provided no relief. Prior diagnostic workup includes ultrasound, CT scan and GI consult ("Fluids" she has had laparoscopic done end of June.). Hx of endometriosis, US showed a hemorrhagic cyst   On 07/18/17 patient presented to the office for the first time with the LLQ abdominal pain. At that time it was felt to be a renal stone as she had trace hematuria and h/o stones. She underwent a KUB that showed bilateral renal stones. She subsequently underwent a CT renal stone protocol during an ER visit on 07/25/17. This revealed bilateral renal stones that were still within the kidneys, no ureteral stone. CT did mention sigmoid diverticula, none showing signs of diverticulitis at that time.  She followed up with [place] on 07/31/17 and a pelvic and transvaginal US were ordered for continued pain. This  revealed a RIGHT hemorrhagic cyst, no abnormalities noted on the left. She then saw Urology on 08/08/17 and they agreed the renal stones were stable WITHIN the kidneys, none moving and most likely not the source of the pain at that time. She was then referred to GYN on 08/11/17. Due to her endometriosis history and right ovarian cyst it was felt she may benefit from an exploratory laparotomy. This was done on 09/02/17. Other than the cyst the laparotomy was unremarkable. The RIGHT cyst was removed.    Patient has reported during this time her BM have remained normal. She has CIC at baseline and uses Miralax daily with relief. She reports she has a normal smooth BM once daily with Miralax. Denies diarrhea. Denies melena. Has occasional BRB noted on stool, never mixed in. Patient always felt it was a hemorrhoid. Main issue is LLQ pain, nausea and vomiting. Denies fevers. BM do not help the pain. LLQ pain only subsides with sleep. It has been episodic. Will come on for 2-3 weeks, then subside for 1-2 weeks then return.     Allergies  Allergen Reactions  . Compazine Other (See Comments)    Dystonia  . Metoclopramide Other (See Comments)    dystonia     Current Outpatient Medications:  .  ALPRAZolam (XANAX) 1 MG tablet, TAKE 1/2 TO 1 TABLET BY MOUTH 3 TIMES A DAY AS NEEDED FOR ANXIETY, Disp: 90 tablet, Rfl: 5 .  aspirin-acetaminophen-caffeine (EXCEDRIN MIGRAINE) 250-250-65 MG tablet, Take 2 tablets by mouth daily as needed for headache., Disp: , Rfl:  .  baclofen (LIORESAL) 10 MG tablet, Take 10 mg by mouth 2 (two) times daily as needed (migraines). , Disp: , Rfl: 0 .  chlorproMAZINE (THORAZINE) 25 MG tablet, take 25mg  by mouth every 4 to 6 hours as needed for migraines, Disp: , Rfl:  .  Cholecalciferol (VITAMIN D3) 5000 units CAPS, Take 5,000 Units by mouth daily., Disp: , Rfl:  .  gabapentin (NEURONTIN) 400 MG capsule, Take 1 capsule (400 mg total) by mouth at bedtime., Disp: 90 capsule, Rfl: 0 .   ibuprofen (ADVIL,MOTRIN) 200 MG tablet, Take 800 mg by mouth 3 (three) times daily as needed for moderate pain., Disp: , Rfl:  .  Ketorolac Tromethamine (SPRIX) 15.75 MG/SPRAY SOLN, Place 1 spray into the nose every 8 (eight) hours as needed. (Patient not taking: Reported on 09/02/2017), Disp: 5 each, Rfl: 1 .  Multiple Vitamin (MULTIVITAMIN WITH MINERALS) TABS tablet, Take 1 tablet by mouth daily., Disp: , Rfl:  .  omeprazole (PRILOSEC) 20 MG capsule, TAKE 1 CAPSULE BY MOUTH ONCE DAILY, Disp: 30 capsule, Rfl: 2 .  OnabotulinumtoxinA (BOTOX IJ), Inject 1 each as directed every 3 (three) months. Alternating with trigger point inj, Disp: , Rfl:  .  oxyCODONE-acetaminophen (PERCOCET/ROXICET) 5-325 MG tablet, Take 1 tablet by mouth every 3 (three) hours as needed for moderate pain., Disp: 30 tablet, Rfl: 0 .  promethazine (PHENERGAN) 25 MG tablet, Take 1 tablet (25 mg total) by mouth every 8 (eight) hours as needed for nausea or vomiting., Disp: 30 tablet, Rfl: 0 .  topiramate (TOPAMAX) 100 MG tablet, Take one tablet daily x 1 week and then can increase to 2 tabs PO daily (Patient not taking: Reported on 08/27/2017), Disp: 60 tablet, Rfl: 5 .  valACYclovir (VALTREX) 500 MG tablet, Take 2 tablets (1,000 mg total) by mouth 2 (two) times daily as needed (cold sores). (Patient not taking: Reported on 09/02/2017), Disp: 20 tablet, Rfl: 5  Review of Systems  Constitutional: Positive for fatigue. Negative for fever.  Respiratory: Negative for cough, chest tightness, shortness of breath and wheezing.   Cardiovascular: Negative for chest pain, palpitations and leg swelling.  Gastrointestinal: Positive for abdominal pain, constipation (at baseline, but improved with Miralax), nausea and vomiting. Negative for abdominal distention and diarrhea.  Endocrine: Negative for polyuria.  Genitourinary: Negative for difficulty urinating, frequency, hematuria and pelvic pain.  Musculoskeletal: Positive for back pain (LLQ pain  radiates to back).  Neurological: Negative.     Social History   Tobacco Use  . Smoking status: Former Smoker    Packs/day: 0.50    Years: 15.00    Pack years: 7.50    Types: Cigarettes    Start date: 11/03/1999    Last attempt to quit: 09/26/2014    Years since quitting: 3.0  . Smokeless tobacco: Never Used  Substance Use Topics  . Alcohol use: Yes    Alcohol/week: 0.0 standard drinks    Comment: social-rare   Objective:   Wt 199 lb 3.2 oz (90.4 kg)   BMI 31.20 kg/m  Vitals:   10/07/17 1346  Weight: 199 lb 3.2 oz (90.4 kg)     Physical Exam  Constitutional: She is oriented to person, place, and time. She appears well-developed and well-nourished. No distress.  Cardiovascular: Normal rate, regular rhythm and normal heart sounds. Exam reveals no gallop and no friction rub.  No murmur heard. Pulmonary/Chest: Effort normal and breath sounds normal. No respiratory distress. She has no wheezes. She has no rales.  Abdominal: Soft. Normal appearance  and bowel sounds are normal. She exhibits no distension and no mass. There is no hepatosplenomegaly. There is tenderness in the suprapubic area and left lower quadrant. There is guarding (LLQ). There is no rebound and no CVA tenderness.  Neurological: She is alert and oriented to person, place, and time.  Skin: Skin is warm and dry. She is not diaphoretic.  Vitals reviewed.      Assessment & Plan:     1. Left lower quadrant pain UA unremarkable. DDx: diverticulitis, ureteral stone, ovarian cyst, enteritis. CT Abd/pelvis ordered stat for further evaluation. I will f/u pending results. Patient has upcoming appt with GI on 10/13/17. Phenergan refilled as below. Push fluids as tolerated. Call if symptoms worsen.  - CT Abdomen Pelvis W Contrast; Future - POCT Urinalysis Dipstick  2. Nausea See above medical treatment plan. - promethazine (PHENERGAN) 25 MG tablet; Take 1 tablet (25 mg total) by mouth every 8 (eight) hours as needed for  nausea or vomiting.  Dispense: 30 tablet; Refill: 0 - POCT Urinalysis Dipstick  3. History of kidney stones - POCT Urinalysis Dipstick       Margaretann LovelessJennifer M Burnette, PA-C  [facility]

## 2017-10-08 ENCOUNTER — Telehealth: Payer: Self-pay

## 2017-10-08 ENCOUNTER — Encounter: Payer: Self-pay | Admitting: Physician Assistant

## 2017-10-08 NOTE — Telephone Encounter (Signed)
-----   Message from Margaretann LovelessJennifer M Burnette, PA-C sent at 10/08/2017  8:32 AM EDT ----- CT only reveals constipation. No other source of pain.

## 2017-10-08 NOTE — Telephone Encounter (Signed)
-----   Message from Jennifer M Burnette, PA-C sent at 10/08/2017  8:32 AM EDT ----- CT only reveals constipation. No other source of pain. 

## 2017-10-08 NOTE — Telephone Encounter (Signed)
Viewed by Ethelle Lyonebecca M Ramiro on 10/08/2017 8:33 AM

## 2017-10-09 ENCOUNTER — Encounter: Payer: Self-pay | Admitting: Physician Assistant

## 2017-10-09 DIAGNOSIS — R1032 Left lower quadrant pain: Secondary | ICD-10-CM

## 2017-10-09 MED ORDER — TRAMADOL HCL 50 MG PO TABS: 50.0000 mg | ORAL_TABLET | Freq: Three times a day (TID) | ORAL | 0 refills | Status: DC | PRN

## 2017-10-09 NOTE — Addendum Note (Signed)
Addended by: Margaretann LovelessBURNETTE, Arley Garant M on: 10/09/2017 12:15 PM   Modules accepted: Orders

## 2017-10-13 ENCOUNTER — Ambulatory Visit: Payer: BLUE CROSS/BLUE SHIELD | Admitting: Gastroenterology

## 2017-10-13 ENCOUNTER — Other Ambulatory Visit: Payer: Self-pay

## 2017-10-13 ENCOUNTER — Telehealth: Payer: Self-pay

## 2017-10-13 ENCOUNTER — Encounter: Payer: Self-pay | Admitting: Gastroenterology

## 2017-10-13 ENCOUNTER — Encounter: Payer: Self-pay | Admitting: Urology

## 2017-10-13 VITALS — BP 113/81 | HR 85 | Ht 67.0 in | Wt 198.8 lb

## 2017-10-13 DIAGNOSIS — N200 Calculus of kidney: Secondary | ICD-10-CM

## 2017-10-13 DIAGNOSIS — R1032 Left lower quadrant pain: Secondary | ICD-10-CM | POA: Diagnosis not present

## 2017-10-13 NOTE — Telephone Encounter (Signed)
Spoke with patient she states she has burning with the sprix in her nose. She is having flank pain and gross hematuria. Patient told to come in tomorrow for a nurse visit UA and KUB

## 2017-10-13 NOTE — Progress Notes (Signed)
Melodie Bouillon [address]  [address]  [place], [place] [address]  Main: [phone]  Fax: [phone]   Gastroenterology Consultation  Referring Provider:     Maryella Shivers Primary Care Physician:  [name], PA-C Primary Gastroenterologist:  Dr. Melodie Bouillon Reason for Consultation:     Abdominal pain        HPI:    Chief Complaint  Patient presents with  . New Patient (Initial Visit)    referred by Osvaldo Angst, PA-C-currently having acute lower left sided pain    KISTA ROBB is a 37 y.o. y/o female referred for consultation & management  by Dr. Rosezetta Schlatter, Alessandra Bevels, PA-C.  Patient presents with history of abdominal pain since May 2019, intermittent, left lower quadrant, sharp, radiating to the left flank, 8/10, lasting 2 or 3 days, and then relieved spontaneously resolving.  Occurs every 2 to 3 weeks.  Patient has had extensive work-up for this. Most recently, underwent laparoscopy for the abdominal pain, that was normal except for minimal intra-abdominal adhesions, no endometriosis.  Her recent primary care note from October 07, 2017, details her recent work-up which  included CT renal protocol, for work-up of possible kidney stones as well, treatment for constipation, and other work-up that has been unrevealing.  Most recent CT on August 13 shows bilateral nephrolithiasis, without obstructive uropathy.  Possible constipation. She has seen urology and they do not think that nephrolithiasis is the cause of her pain since it is not obstructive. Due to constipation suggested on CT scan, patient states her primary care provider had her do "cleanout", with 16 caps of MiraLAX over 2 to 3 days, which completely clean her out.  This did not help with her pain.  She otherwise reports taking MiraLAX daily and that allows her to have a soft, formed bowel movement every day without straining.  She was seen by Dr. Servando Snare in 2016 due to  abdominal pain and constipation, and was given Amitiza, which patient states caused headaches and so she stopped taking it, but the MiraLAX works for her.  She is also been on Linzess in the past.  She also reports history of a colonoscopy over 10 years ago, that she states was done for constipation and was normal. No family history of colon cancer.  Reports intermittent bright red blood per rectum, about once a month.  Only on toilet paper on wiping, no blood in stool.  Past Medical History:  Diagnosis Date  . Acute pyelonephritis 06/29/2012  . Anxiety   . Asthma    AS A CHILD  . Complication of anesthesia    TROUBLE URINATING IN THE PAST  . Gallstones   . GERD (gastroesophageal reflux disease)   . History of kidney stones   . Kidney stones    bilateral  . Migraine   . Pelvic pain in female   . PONV (postoperative nausea and vomiting)    WITH HYSTERECTOMY ONLY  . Pyelonephritis 2015  . Renal mass    right  . Tobacco abuse     Past Surgical History:  Procedure Laterality Date  . ABDOMINAL HYSTERECTOMY  2006  . ANKLE ARTHROSCOPY Left   . ANKLE ARTHROSCOPY Left 11/24/2015  . APPENDECTOMY  07/26/13  . CAST APPLICATION  12/25/2011   Procedure: CAST APPLICATION;  Surgeon: Javier Docker, MD;  Location: [facility];  Service: Orthopedics;  Laterality: Left;  . CHOLECYSTECTOMY  03/11/2011   Procedure: LAPAROSCOPIC CHOLECYSTECTOMY WITH INTRAOPERATIVE CHOLANGIOGRAM;  Surgeon: Atilano InaEric M Wilson, MD;  [facility];  Service: General;  Laterality: N/A;  . CHOLECYSTECTOMY, LAPAROSCOPIC    . CYSTOSCOPY/URETEROSCOPY/HOLMIUM LASER/STENT PLACEMENT Left 06/18/2016   Procedure: CYSTOSCOPY/URETEROSCOPY/HOLMIUM LASER/STENT PLACEMENT;  Surgeon: Vanna ScotlandAshley Brandon, MD;  Location: [facility];  Service: Urology;  Laterality: Left;  . LAPAROSCOPIC OVARIAN CYSTECTOMY Right 09/02/2017   Procedure: LAPAROSCOPIC OVARIAN CYSTECTOMY;  Surgeon: Nadara MustardHarris, Robert P, MD;  Location: [facility];  Service: Gynecology;  Laterality:  Right;  . LITHOTRIPSY     x 4  . ORIF ANKLE FRACTURE  12/25/2011   Procedure: OPEN REDUCTION INTERNAL FIXATION (ORIF) ANKLE FRACTURE;  Surgeon: Javier DockerJeffrey C Beane, MD;  Location: [facility];  Service: Orthopedics;  Laterality: Left;  . TONSILLECTOMY      Prior to Admission medications   Medication Sig Start Date End Date Taking? Authorizing Provider  ALPRAZolam Prudy Feeler(XANAX) 1 MG tablet TAKE 1/2 TO 1 TABLET BY MOUTH 3 TIMES A DAY AS NEEDED FOR ANXIETY 09/04/17  Yes Burnette, Victorino DikeJennifer M, PA-C  baclofen (LIORESAL) 10 MG tablet Take 10 mg by mouth 2 (two) times daily as needed (migraines).  05/15/16  Yes [provider]  chlorproMAZINE (THORAZINE) 25 MG tablet take 25mg  by mouth every 4 to 6 hours as needed for migraines   Yes [provider]  Cholecalciferol (VITAMIN D3) 5000 units CAPS Take 5,000 Units by mouth daily.   Yes [provider]  gabapentin (NEURONTIN) 400 MG capsule Take 1 capsule (400 mg total) by mouth at bedtime. 09/25/17  Yes Margaretann LovelessBurnette, Jennifer M, PA-C  Multiple Vitamin (MULTIVITAMIN WITH MINERALS) TABS tablet Take 1 tablet by mouth daily.   Yes [provider]  omeprazole (PRILOSEC) 20 MG capsule TAKE 1 CAPSULE BY MOUTH ONCE DAILY 10/06/17  Yes Joycelyn ManBurnette, Jennifer M, PA-C  OnabotulinumtoxinA (BOTOX IJ) Inject 1 each as directed every 3 (three) months. Alternating with trigger point inj   Yes [provider]  polyethylene glycol (MIRALAX / GLYCOLAX) packet Take 17 g by mouth daily.   Yes [provider]  promethazine (PHENERGAN) 25 MG tablet Take 1 tablet (25 mg total) by mouth every 8 (eight) hours as needed for nausea or vomiting. 10/07/17  Yes Burnette, Alessandra BevelsJennifer M, PA-C  traMADol (ULTRAM) 50 MG tablet Take 1 tablet (50 mg total) by mouth every 8 (eight) hours as needed. 10/09/17  Yes Margaretann LovelessBurnette, Jennifer M, PA-C  valACYclovir (VALTREX) 500 MG tablet Take 2 tablets (1,000 mg total) by mouth 2 (two) times daily as needed (cold sores). 10/08/16  Yes  Margaretann LovelessBurnette, Jennifer M, PA-C  ibuprofen (ADVIL,MOTRIN) 200 MG tablet Take 800 mg by mouth 3 (three) times daily as needed for moderate pain.    [provider]  Ketorolac Tromethamine (SPRIX) 15.75 MG/SPRAY SOLN Place 1 spray into the nose every 8 (eight) hours as needed. Patient not taking: Reported on 10/07/2017 08/08/17   Vanna ScotlandBrandon, Ashley, MD    Family History  Problem Relation Age of Onset  . Diabetes Mother   . Diabetes Father   . Heart failure Father   . Hypertension Father   . Pulmonary fibrosis Father   . Nephrolithiasis Father   . Kidney cancer Neg Hx   . Bladder Cancer Neg Hx      Social History   Tobacco Use  . Smoking status: Former Smoker    Packs/day: 0.50    Years: 15.00    Pack years: 7.50    Types: Cigarettes    Start date: 11/03/1999    Last attempt to quit: 09/26/2014  Years since quitting: 3.0  . Smokeless tobacco: Never Used  Substance Use Topics  . Alcohol use: Yes    Alcohol/week: 0.0 standard drinks    Comment: social-rare  . Drug use: No    Allergies as of 10/13/2017 - Review Complete 10/13/2017  Allergen Reaction Noted  . Compazine Other (See Comments) 01/19/2011  . Metoclopramide Other (See Comments)     Review of Systems:    All systems reviewed and negative except where noted in HPI.   Physical Exam:  BP 113/81   Pulse 85   Ht 5\' 7"  (1.702 m)   Wt 198 lb 12.8 oz (90.2 kg)   BMI 31.14 kg/m  No LMP recorded. Patient has had a hysterectomy. Psych:  Alert and cooperative. Normal mood and affect. General:   Alert,  Well-developed, well-nourished, pleasant and cooperative in NAD Head:  Normocephalic and atraumatic. Eyes:  Sclera clear, no icterus.   Conjunctiva pink. Ears:  Normal auditory acuity. Nose:  No deformity, discharge, or lesions. Mouth:  No deformity or lesions,oropharynx pink & moist. Neck:  Supple; no masses or thyromegaly. Lungs:  Respirations even and unlabored.  Clear throughout to auscultation.   No wheezes,  crackles, or rhonchi. No acute distress. Heart:  Regular rate and rhythm; no murmurs, clicks, rubs, or gallops. Abdomen:  Normal bowel sounds.  No bruits.  Soft, non-tender and non-distended without masses, hepatosplenomegaly or hernias noted.  No guarding or rebound tenderness.    Msk:  Symmetrical without gross deformities. Good, equal movement & strength bilaterally. Pulses:  Normal pulses noted. Extremities:  No clubbing or edema.  No cyanosis. Neurologic:  Alert and oriented x3;  grossly normal neurologically. Skin:  Intact without significant lesions or rashes. No jaundice. Lymph Nodes:  No significant cervical adenopathy. Psych:  Alert and cooperative. Normal mood and affect.   Labs: CBC    Component Value Date/Time   WBC 6.8 09/02/2017 0844   RBC 4.20 09/02/2017 0844   HGB 13.6 09/02/2017 0844   HGB 13.2 06/14/2014 1557   HCT 39.0 09/02/2017 0844   HCT 40.3 06/14/2014 1557   PLT 208 09/02/2017 0844   PLT 237 06/14/2014 1557   MCV 93.0 09/02/2017 0844   MCV 93 06/14/2014 1557   MCH 32.3 09/02/2017 0844   MCHC 34.7 09/02/2017 0844   RDW 13.2 09/02/2017 0844   RDW 13.4 06/14/2014 1557   LYMPHSABS 2.9 06/15/2016 2054   LYMPHSABS 2.3 06/14/2014 1557   MONOABS 0.5 06/15/2016 2054   MONOABS 0.5 06/14/2014 1557   EOSABS 0.1 06/15/2016 2054   EOSABS 0.1 06/14/2014 1557   BASOSABS 0.1 06/15/2016 2054   BASOSABS 0.1 06/14/2014 1557   CMP     Component Value Date/Time   NA 136 07/25/2017 0714   NA 138 06/14/2014 1557   K 3.7 07/25/2017 0714   K 3.6 06/14/2014 1557   CL 104 07/25/2017 0714   CL 112 (H) 06/14/2014 1557   CO2 21 (L) 07/25/2017 0714   CO2 23 06/14/2014 1557   GLUCOSE 96 07/25/2017 0714   GLUCOSE 85 06/14/2014 1557   BUN 10 07/25/2017 0714   BUN 12 06/14/2014 1557   CREATININE 0.83 07/25/2017 0714   CREATININE 0.74 06/14/2014 1557   CALCIUM 9.6 07/25/2017 0714   CALCIUM 9.6 06/14/2014 1557   PROT 7.2 07/25/2017 0714   PROT 7.7 06/14/2014 1557    ALBUMIN 4.1 07/25/2017 0714   ALBUMIN 4.5 06/14/2014 1557   AST 26 07/25/2017 0714   AST 13 (L) 06/14/2014  1557   ALT 24 07/25/2017 0714   ALT 10 (L) 06/14/2014 1557   ALKPHOS 59 07/25/2017 0714   ALKPHOS 81 06/14/2014 1557   BILITOT 0.6 07/25/2017 0714   BILITOT 0.7 06/14/2014 1557   GFRNONAA >60 07/25/2017 0714   GFRNONAA >60 06/14/2014 1557   GFRAA >60 07/25/2017 0714   GFRAA >60 06/14/2014 1557    Imaging Studies: Ct Abdomen Pelvis W Contrast  Result Date: 10/07/2017 CLINICAL DATA:  Left lower quadrant pain with nausea and vomiting for 1 week. Hysterectomy. Cholecystectomy. Appendectomy. Diverticulitis suspected. EXAM: CT ABDOMEN AND PELVIS WITH CONTRAST TECHNIQUE: Multidetector CT imaging of the abdomen and pelvis was performed using the standard protocol following bolus administration of intravenous contrast. CONTRAST:  100mL OMNIPAQUE IOHEXOL 300 MG/ML  SOLN COMPARISON:  07/25/2017 stone study. FINDINGS: Lower chest: Clear lung bases. Normal heart size without pericardial or pleural effusion. Hepatobiliary: Variant lateral segment left liver lobe extending in the left upper quadrant. Cholecystectomy, without biliary ductal dilatation. Pancreas: Normal, without mass or ductal dilatation. Spleen: 6 mm low-density splenic lesion is of no clinical significance. Adrenals/Urinary Tract: Normal adrenal glands. Bilateral renal collecting system calculi again identified. These measure maximally 4 mm. No hydronephrosis. Normal ureters and urinary bladder. Stomach/Bowel: Normal stomach, without wall thickening. Colonic stool burden suggests constipation. Normal terminal ileum. Normal small bowel. Vascular/Lymphatic: Normal caliber of the aorta and branch vessels. No abdominopelvic adenopathy. Reproductive: Hysterectomy.  No adnexal mass. Other: No significant free fluid.  No free intraperitoneal air. Musculoskeletal: No acute osseous abnormality. IMPRESSION: 1. Bilateral nephrolithiasis, without  obstructive uropathy. 2.  Possible constipation.  No other explanation for pain. These results will be called to the ordering clinician or representative by the Radiology Department at the imaging location. Electronically Signed   By: Jeronimo GreavesKyle  Talbot M.D.   On: 10/07/2017 16:57    Assessment and Plan:   Ethelle LyonRebecca M Scritchfield is a 37 y.o. y/o female has been referred for left lower quadrant abdominal pain, with previous extensive work-up being unrevealing  Patient states her pain feels like her previous kidney stones However, urology has evaluated the patient and and they do not believe renal stone seen on CT of the cause of her pain  Given her extensive work-up, and reassuring findings, her pain is likely functional in nature Treatment for constipation has not relieved her pain  She denies any new stressors, and does not think anxiety and depression bring on her pain either  I will check for H. pylori serology, although it is unlikely to be leading to left lower quadrant abdominal pain even if it is positive  If pain does not resolve, can consider colonoscopy to complete work-up Continue MiraLAX daily Continue high-fiber diet  Patient is on Prilosec daily for heartburn, and states when she tried to stop the medication she had severe heartburn and thus had to resume the medication.  Used to be on omeprazole 40 mg daily, and has cut it down to 20 mg daily which controls her heartburn.  Is not willing to discontinue the medication.  Dr Melodie BouillonVarnita Andromeda Poppen

## 2017-10-13 NOTE — Addendum Note (Signed)
Addended by: Jackquline DenmarkIDGEWAY, Shabrea Weldin W on: 10/13/2017 03:26 PM   Modules accepted: Orders

## 2017-10-14 ENCOUNTER — Telehealth: Payer: Self-pay

## 2017-10-14 ENCOUNTER — Ambulatory Visit (INDEPENDENT_AMBULATORY_CARE_PROVIDER_SITE_OTHER): Payer: BLUE CROSS/BLUE SHIELD

## 2017-10-14 ENCOUNTER — Ambulatory Visit
Admission: RE | Admit: 2017-10-14 | Discharge: 2017-10-14 | Disposition: A | Discharge status: Home / Self Care | Payer: BLUE CROSS/BLUE SHIELD | Source: Ambulatory Visit | Attending: Urology | Admitting: Urology

## 2017-10-14 ENCOUNTER — Encounter: Payer: Self-pay | Admitting: Urology

## 2017-10-14 DIAGNOSIS — N200 Calculus of kidney: Secondary | ICD-10-CM

## 2017-10-14 LAB — MICROSCOPIC EXAMINATION
Bacteria, UA: NONE SEEN
WBC, UA: NONE SEEN /hpf (ref 0–5)

## 2017-10-14 LAB — URINALYSIS, COMPLETE
Bilirubin, UA: NEGATIVE
Glucose, UA: NEGATIVE
Ketones, UA: NEGATIVE
Leukocytes, UA: NEGATIVE
Nitrite, UA: NEGATIVE
Protein, UA: NEGATIVE
Specific Gravity, UA: 1.015 (ref 1.005–1.030)
Urobilinogen, Ur: 0.2 mg/dL (ref 0.2–1.0)
pH, UA: 7.5 (ref 5.0–7.5)

## 2017-10-14 MED ORDER — TAMSULOSIN HCL 0.4 MG PO CAPS: 0.4000 mg | ORAL_CAPSULE | Freq: Every day | ORAL | 0 refills | Status: DC

## 2017-10-14 MED ORDER — KETOROLAC TROMETHAMINE 60 MG/2ML IM SOLN
30.0000 mg | Freq: Once | INTRAMUSCULAR | Status: AC
  Administered 2017-10-14: 30 mg via INTRAMUSCULAR

## 2017-10-14 NOTE — Progress Notes (Signed)
Spoke with patient on the phone and she states pain is worse. Patient was told to come back to the office for a Toradol injection per Dr. Apolinar JunesBrandon, Flomax script was sent to pharmacy.  IM Injection  Patient is present today for an IM Injection for treatment of stone pain Drug: Ketorolac Dose:30mg / 1ml Location:right upper outer buttocks Lot: [id number] Exp:02/2018 Patient tolerated well, no complications were noted  Preformed by: Eligha BridegroomSarah Watts, CMA  Additional notes/ Follow up: will call with renal u/s results

## 2017-10-14 NOTE — Progress Notes (Signed)
Pt presented in office today for a nurse visit. She is having continued flank pain with hematuria. A urine sample was collected, KUB was reviewed by Dr. Apolinar JunesBrandon, noting no significant findings. Ultrasound has been ordered for further evaluation of hydronephrosis or swelling.

## 2017-10-14 NOTE — Addendum Note (Signed)
Addended by: Martha ClanWATTS, Taiven Greenley M on: 10/14/2017 04:54 PM   Modules accepted: Orders

## 2017-10-14 NOTE — Telephone Encounter (Signed)
Renal ultrasound has been ordered for pt. Please help her get this scheduled.

## 2017-10-14 NOTE — Telephone Encounter (Signed)
KUB personally reviewed.  It does not appear that she is passing any significant ureteral stones on x-ray.  That being said, x-ray is not a perfect study for evaluating for ureteral stones.  If she continues to have pain, we can order ultrasound to see if she has any hydronephrosis or swelling.   Drink plenty of water and start Flomax.  Also like her to come by drop of urine.  Sophia ScotlandAshley Anina Schnake, MD

## 2017-10-15 DIAGNOSIS — G43719 Chronic migraine without aura, intractable, without status migrainosus: Secondary | ICD-10-CM | POA: Diagnosis not present

## 2017-10-15 NOTE — Telephone Encounter (Signed)
Sent a message to scheduling   [name] [name]

## 2017-10-20 ENCOUNTER — Ambulatory Visit: Payer: BLUE CROSS/BLUE SHIELD | Attending: Urology

## 2017-10-20 ENCOUNTER — Encounter: Payer: Self-pay | Admitting: Physician Assistant

## 2017-10-20 DIAGNOSIS — N83201 Unspecified ovarian cyst, right side: Secondary | ICD-10-CM | POA: Diagnosis not present

## 2017-10-20 DIAGNOSIS — M549 Dorsalgia, unspecified: Secondary | ICD-10-CM | POA: Diagnosis not present

## 2017-10-20 DIAGNOSIS — R1032 Left lower quadrant pain: Secondary | ICD-10-CM | POA: Diagnosis not present

## 2017-10-20 DIAGNOSIS — N760 Acute vaginitis: Secondary | ICD-10-CM | POA: Diagnosis not present

## 2017-10-20 DIAGNOSIS — Z87891 Personal history of nicotine dependence: Secondary | ICD-10-CM | POA: Diagnosis not present

## 2017-10-20 DIAGNOSIS — R110 Nausea: Secondary | ICD-10-CM | POA: Diagnosis not present

## 2017-10-20 DIAGNOSIS — M545 Low back pain, unspecified: Secondary | ICD-10-CM

## 2017-10-20 DIAGNOSIS — R319 Hematuria, unspecified: Secondary | ICD-10-CM | POA: Diagnosis not present

## 2017-10-20 DIAGNOSIS — R630 Anorexia: Secondary | ICD-10-CM | POA: Diagnosis not present

## 2017-10-20 DIAGNOSIS — N200 Calculus of kidney: Secondary | ICD-10-CM | POA: Diagnosis not present

## 2017-10-20 DIAGNOSIS — Z90710 Acquired absence of both cervix and uterus: Secondary | ICD-10-CM | POA: Diagnosis not present

## 2017-10-20 DIAGNOSIS — R000 Tachycardia, unspecified: Secondary | ICD-10-CM | POA: Diagnosis not present

## 2017-10-20 DIAGNOSIS — Z9049 Acquired absence of other specified parts of digestive tract: Secondary | ICD-10-CM | POA: Diagnosis not present

## 2017-10-20 DIAGNOSIS — G43909 Migraine, unspecified, not intractable, without status migrainosus: Secondary | ICD-10-CM | POA: Diagnosis not present

## 2017-10-20 DIAGNOSIS — B9689 Other specified bacterial agents as the cause of diseases classified elsewhere: Secondary | ICD-10-CM | POA: Diagnosis not present

## 2017-10-20 NOTE — Addendum Note (Signed)
Addended by: Margaretann LovelessBURNETTE, Shayle Donahoo M on: 10/20/2017 05:09 PM   Modules accepted: Orders

## 2017-10-23 DIAGNOSIS — M791 Myalgia, unspecified site: Secondary | ICD-10-CM | POA: Diagnosis not present

## 2017-10-23 DIAGNOSIS — G43719 Chronic migraine without aura, intractable, without status migrainosus: Secondary | ICD-10-CM | POA: Diagnosis not present

## 2017-10-23 DIAGNOSIS — M542 Cervicalgia: Secondary | ICD-10-CM | POA: Diagnosis not present

## 2017-10-23 DIAGNOSIS — G518 Other disorders of facial nerve: Secondary | ICD-10-CM | POA: Diagnosis not present

## 2017-11-04 ENCOUNTER — Ambulatory Visit
Admission: RE | Admit: 2017-11-04 | Discharge: 2017-11-04 | Disposition: A | Discharge status: Home / Self Care | Payer: BLUE CROSS/BLUE SHIELD | Source: Ambulatory Visit | Attending: Physician Assistant | Admitting: Physician Assistant

## 2017-11-04 DIAGNOSIS — M545 Low back pain, unspecified: Secondary | ICD-10-CM

## 2017-11-06 ENCOUNTER — Telehealth: Payer: Self-pay

## 2017-11-06 NOTE — Telephone Encounter (Signed)
Patient advised as below. Viewed by Ethelle Lyonebecca M Hedtke on 11/05/2017 5:36 PM

## 2017-11-06 NOTE — Telephone Encounter (Signed)
-----   Message from Margaretann LovelessJennifer M Burnette, PA-C sent at 11/05/2017  8:25 AM EDT ----- There is no bony abnormality of the lumbar spine noted. Disc space is maintained. There is some arthritic changes of the RIGHT SI joint (her pain is worse on the left). There are bilateral kidney stones present (largest measuring 5mm). These are still in the kidneys. There is also moderate stool burden noted (constipation). The only thing that fits her symptoms from the xray would be the constipation.

## 2017-11-19 ENCOUNTER — Telehealth: Payer: Self-pay | Admitting: Physician Assistant

## 2017-11-20 ENCOUNTER — Ambulatory Visit (INDEPENDENT_AMBULATORY_CARE_PROVIDER_SITE_OTHER): Payer: BLUE CROSS/BLUE SHIELD | Admitting: Physician Assistant

## 2017-11-20 ENCOUNTER — Encounter: Payer: Self-pay | Admitting: Physician Assistant

## 2017-11-20 VITALS — BP 110/82 | HR 64 | Temp 98.9°F | Resp 16 | Ht 67.0 in | Wt 197.0 lb

## 2017-11-20 DIAGNOSIS — Z13220 Encounter for screening for lipoid disorders: Secondary | ICD-10-CM | POA: Diagnosis not present

## 2017-11-20 DIAGNOSIS — K5909 Other constipation: Secondary | ICD-10-CM

## 2017-11-20 DIAGNOSIS — Z1231 Encounter for screening mammogram for malignant neoplasm of breast: Secondary | ICD-10-CM

## 2017-11-20 DIAGNOSIS — Z0000 Encounter for general adult medical examination without abnormal findings: Secondary | ICD-10-CM | POA: Diagnosis not present

## 2017-11-20 DIAGNOSIS — Z136 Encounter for screening for cardiovascular disorders: Secondary | ICD-10-CM

## 2017-11-20 DIAGNOSIS — Z1239 Encounter for other screening for malignant neoplasm of breast: Secondary | ICD-10-CM

## 2017-11-20 DIAGNOSIS — G43009 Migraine without aura, not intractable, without status migrainosus: Secondary | ICD-10-CM

## 2017-11-20 DIAGNOSIS — M797 Fibromyalgia: Secondary | ICD-10-CM

## 2017-11-20 DIAGNOSIS — N760 Acute vaginitis: Secondary | ICD-10-CM

## 2017-11-20 DIAGNOSIS — B9689 Other specified bacterial agents as the cause of diseases classified elsewhere: Secondary | ICD-10-CM

## 2017-11-20 MED ORDER — DULOXETINE HCL 30 MG PO CPEP: 30.0000 mg | ORAL_CAPSULE | Freq: Every day | ORAL | 0 refills | Status: DC

## 2017-11-20 MED ORDER — METRONIDAZOLE 500 MG PO TABS: 500.0000 mg | ORAL_TABLET | Freq: Two times a day (BID) | ORAL | 0 refills | Status: DC

## 2017-11-20 MED ORDER — METRONIDAZOLE 0.75 % EX GEL: CUTANEOUS | 0 refills | Status: DC

## 2017-11-20 NOTE — Progress Notes (Signed)
Patient: [name], Female    DOB: 12/10/1980, 37 y.o.   MRN: [id number] Visit Date: 11/20/2017  Today's Provider: [name], PA-C   Chief Complaint  Patient presents with  . Annual Exam  . Abdominal Pain  . Back Pain   Subjective:    Annual physical exam [name] is a 37 y.o. female who presents today for health maintenance and complete physical. She feels well. She reports exercising 3 days a week. She reports she is sleeping well. Hysterectomy (total)-2006 -----------------------------------------------------------------  Back Pain: Patient presents for presents evaluation of low back problems.  Symptoms have been present for several weeks and include lower back. Initial inciting event: none Treatments so far initiated by patient: gabapentin 400 mg at bedtime   Abdominal Pain: Patient complains of abdominal pain. The pain is described as cramping. Pain is located in the suprapubic without radiation. Onset was several days ago. Symptoms have been gradually improving since. Aggravating factors: none.  Alleviating factors: Miralax. Associated symptoms: constipation. The patient denies anorexia. Patient reports abdominal pain is better.   Review of Systems  Constitutional: Positive for fatigue.  HENT: Negative.   Eyes: Negative.   Respiratory: Negative.   Cardiovascular: Negative.   Gastrointestinal: Positive for abdominal distention.  Endocrine: Negative.   Genitourinary: Negative.   Musculoskeletal: Positive for arthralgias, back pain and myalgias.  Skin: Negative.   Allergic/Immunologic: Negative.   Neurological: Negative.   Hematological: Negative.   Psychiatric/Behavioral: The patient is nervous/anxious.     Social History      She  reports that she quit smoking about 3 years ago. Her smoking use included cigarettes. She started smoking about 18 years ago. She has a 7.50 pack-year smoking history. She has never used smokeless  tobacco. She reports that she drinks alcohol. She reports that she does not use drugs.       Social History   Socioeconomic History  . Marital status: Married    Spouse name: Not on file  . Number of children: Not on file  . Years of education: Not on file  . Highest education level: Not on file  Occupational History  . Not on file  Social Needs  . Financial resource strain: Not on file  . Food insecurity:    Worry: Not on file    Inability: Not on file  . Transportation needs:    Medical: Not on file    Non-medical: Not on file  Tobacco Use  . Smoking status: Former Smoker    Packs/day: 0.50    Years: 15.00    Pack years: 7.50    Types: Cigarettes    Start date: 11/03/1999    Last attempt to quit: 09/26/2014    Years since quitting: 3.1  . Smokeless tobacco: Never Used  Substance and Sexual Activity  . Alcohol use: Yes    Alcohol/week: 0.0 standard drinks    Comment: social-rare  . Drug use: No  . Sexual activity: Yes    Birth control/protection: Surgical    Comment: Hysterectomy   Lifestyle  . Physical activity:    Days per week: Not on file    Minutes per session: Not on file  . Stress: Not on file  Relationships  . Social connections:    Talks on phone: Not on file    Gets together: Not on file    Attends religious service: Not on file    Active member of club or organization: Not on  file    Attends meetings of clubs or organizations: Not on file    Relationship status: Not on file  Other Topics Concern  . Not on file  Social History Narrative  . Not on file    Past Medical History:  Diagnosis Date  . Acute pyelonephritis 06/29/2012  . Anxiety   . Asthma    AS A CHILD  . Complication of anesthesia    TROUBLE URINATING IN THE PAST  . Gallstones   . GERD (gastroesophageal reflux disease)   . History of kidney stones   . Kidney stones    bilateral  . Migraine   . Pelvic pain in female   . PONV (postoperative nausea and vomiting)    WITH HYSTERECTOMY  ONLY  . Pyelonephritis 2015  . Renal mass    right  . Tobacco abuse      Patient Active Problem List   Diagnosis Date Noted  . Endometriosis 08/27/2017  . Pelvic pain 08/27/2017  . IBS (irritable bowel syndrome) 02/02/2015  . Herpes simplex 02/02/2015  . Asthma, mild intermittent 11/03/2014  . Migraine without aura or status migrainosus 11/03/2014  . Gastroesophageal reflux disease without esophagitis 11/03/2014  . Generalized anxiety disorder 11/03/2014  . Cyst of ovary 08/06/2013  . Appendicolith 07/16/2013  . Chronic constipation 06/30/2012  . History of pyelonephritis 06/29/2012  . Nephrolithiasis 06/28/2012    Past Surgical History:  Procedure Laterality Date  . ABDOMINAL HYSTERECTOMY  2006  . ANKLE ARTHROSCOPY Left   . ANKLE ARTHROSCOPY Left 11/24/2015  . APPENDECTOMY  07/26/13  . CAST APPLICATION  12/25/2011   Procedure: CAST APPLICATION;  Surgeon: Javier Docker, MD;  Location: [facility];  Service: Orthopedics;  Laterality: Left;  . CHOLECYSTECTOMY  03/11/2011   Procedure: LAPAROSCOPIC CHOLECYSTECTOMY WITH INTRAOPERATIVE CHOLANGIOGRAM;  Surgeon: Atilano Ina, MD;  Location: [facility] OR;  Service: General;  Laterality: N/A;  . CHOLECYSTECTOMY, LAPAROSCOPIC    . CYSTOSCOPY/URETEROSCOPY/HOLMIUM LASER/STENT PLACEMENT Left 06/18/2016   Procedure: CYSTOSCOPY/URETEROSCOPY/HOLMIUM LASER/STENT PLACEMENT;  Surgeon: Vanna Scotland, MD;  Location: [facility] ORS;  Service: Urology;  Laterality: Left;  . LAPAROSCOPIC OVARIAN CYSTECTOMY Right 09/02/2017   Procedure: LAPAROSCOPIC OVARIAN CYSTECTOMY;  Surgeon: Nadara Mustard, MD;  Location: [facility] ORS;  Service: Gynecology;  Laterality: Right;  . LITHOTRIPSY     x 4  . ORIF ANKLE FRACTURE  12/25/2011   Procedure: OPEN REDUCTION INTERNAL FIXATION (ORIF) ANKLE FRACTURE;  Surgeon: Javier Docker, MD;  Location: [facility];  Service: Orthopedics;  Laterality: Left;  . TONSILLECTOMY      Family History        Family Status  Relation Name Status  .  Mother  Deceased  . Father  Deceased  . Sister  Alive  . Brother  Alive  . Brother  Alive  . Brother  Alive  . Neg Hx  (Not Specified)        Her family history includes Diabetes in her father and mother; Heart failure in her father; Hypertension in her father; Nephrolithiasis in her father; Pulmonary fibrosis in her father. There is no history of Kidney cancer or Bladder Cancer.      Allergies  Allergen Reactions  . Compazine Other (See Comments)    Dystonia  . Metoclopramide Other (See Comments)    dystonia     Current Outpatient Medications:  .  ALPRAZolam (XANAX) 1 MG tablet, TAKE 1/2 TO 1 TABLET BY MOUTH 3 TIMES A DAY AS NEEDED FOR ANXIETY, Disp: 90 tablet, Rfl: 5 .  baclofen (LIORESAL) 10 MG tablet, Take 10 mg by mouth 2 (two) times daily as needed (migraines). , Disp: , Rfl: 0 .  chlorproMAZINE (THORAZINE) 25 MG tablet, take 25mg  by mouth every 4 to 6 hours as needed for migraines, Disp: , Rfl:  .  Cholecalciferol (VITAMIN D3) 5000 units CAPS, Take 5,000 Units by mouth daily., Disp: , Rfl:  .  gabapentin (NEURONTIN) 400 MG capsule, Take 1 capsule (400 mg total) by mouth at bedtime., Disp: 90 capsule, Rfl: 0 .  Multiple Vitamin (MULTIVITAMIN WITH MINERALS) TABS tablet, Take 1 tablet by mouth daily., Disp: , Rfl:  .  omeprazole (PRILOSEC) 20 MG capsule, TAKE 1 CAPSULE BY MOUTH ONCE DAILY, Disp: 30 capsule, Rfl: 2 .  OnabotulinumtoxinA (BOTOX IJ), Inject 1 each as directed every 3 (three) months. Alternating with trigger point inj, Disp: , Rfl:  .  polyethylene glycol (MIRALAX / GLYCOLAX) packet, Take 17 g by mouth daily., Disp: , Rfl:  .  promethazine (PHENERGAN) 25 MG tablet, Take 1 tablet (25 mg total) by mouth every 8 (eight) hours as needed for nausea or vomiting., Disp: 30 tablet, Rfl: 0 .  valACYclovir (VALTREX) 500 MG tablet, Take 2 tablets (1,000 mg total) by mouth 2 (two) times daily as needed (cold sores)., Disp: 20 tablet, Rfl: 5   Patient Care Team: [name], PA-C as PCP - General (Family Medicine) Alba Cory, MD as Attending Physician (Family Medicine) Earline Mayotte, MD (General Surgery)      Objective:   Vitals: BP 110/82 (BP Location: Left Arm, Patient Position: Sitting, Cuff Size: Normal)   Pulse 64   Temp 98.9 F (37.2 C) (Oral)   Resp 16   Ht 5\' 7"  (1.702 m)   Wt 197 lb (89.4 kg)   SpO2 99%   BMI 30.85 kg/m    Vitals:   11/20/17 1440  BP: 110/82  Pulse: 64  Resp: 16  Temp: 98.9 F (37.2 C)  TempSrc: Oral  SpO2: 99%  Weight: 197 lb (89.4 kg)  Height: 5\' 7"  (1.702 m)     Physical Exam  Constitutional: She is oriented to person, place, and time. She appears well-developed and well-nourished. No distress.  HENT:  Head: Normocephalic and atraumatic.  Right Ear: Hearing, tympanic membrane, external ear and ear canal normal.  Left Ear: Hearing, tympanic membrane, external ear and ear canal normal.  Nose: Nose normal.  Mouth/Throat: Uvula is midline, oropharynx is clear and moist and mucous membranes are normal. No oropharyngeal exudate.  Eyes: Pupils are equal, round, and reactive to light. Conjunctivae, EOM and lids are normal. Right eye exhibits no discharge. Left eye exhibits no discharge. No scleral icterus.  Neck: Trachea normal and normal range of motion. Neck supple. No JVD present. Carotid bruit is not present. No tracheal deviation present. No thyroid mass and no thyromegaly present.  Cardiovascular: Normal rate, regular rhythm, normal heart sounds and intact distal pulses. Exam reveals no gallop and no friction rub.  No murmur heard. Pulmonary/Chest: Effort normal and breath sounds normal. No respiratory distress. She has no wheezes. She has no rales. She exhibits no tenderness. Right breast exhibits no inverted nipple, no mass, no nipple discharge, no skin change and no tenderness. Left breast exhibits no inverted nipple, no mass, no nipple discharge, no skin change and no tenderness. No breast  swelling, tenderness, discharge or bleeding. Breasts are symmetrical.  Abdominal: Soft. Normal appearance and bowel sounds are normal. She exhibits no distension and no mass. There is  no hepatosplenomegaly. There is no tenderness. There is no rebound and no guarding.  Musculoskeletal: Normal range of motion. She exhibits no edema or tenderness.  Lymphadenopathy:    She has no cervical adenopathy.    She has no axillary adenopathy.  Neurological: She is alert and oriented to person, place, and time. She has normal strength. No cranial nerve deficit.  Skin: Skin is warm, dry and intact. No rash noted. She is not diaphoretic.  Psychiatric: She has a normal mood and affect. Her speech is normal and behavior is normal. Judgment and thought content normal. Cognition and memory are normal.  Vitals reviewed.    Depression Screen PHQ 2/9 Scores 11/20/2017 08/15/2016 02/02/2015 11/03/2014  PHQ - 2 Score 1 0 0 0  PHQ- 9 Score 4 2 - -      Assessment & Plan:     Routine Health Maintenance and Physical Exam  Exercise Activities and Dietary recommendations Goals   None     Immunization History  Administered Date(s) Administered  . Influenza-Unspecified 01/03/2015  . Tdap 03/22/2011    Health Maintenance  Topic Date Due  . PAP SMEAR  12/07/2019 (Originally 06/11/1999)  . TETANUS/TDAP  03/21/2021  . INFLUENZA VACCINE  Completed  . HIV Screening  Completed     Discussed health benefits of physical activity, and encouraged her to engage in regular exercise appropriate for her age and condition.    1. Annual physical exam Normal physical exam today. Will check labs as below and f/u pending lab results. If labs are stable and WNL she will not need to have these rechecked for one year at her next annual physical exam. She is to call the office in the meantime if she has any acute issue, questions or concerns. - CBC with Differential/Platelet - Comprehensive metabolic panel - Lipid Panel  With LDL/HDL Ratio - TSH - HgB N5A  2. Breast cancer screening Breast exam today was normal. There is no family history of breast cancer. She does perform regular self breast exams.   3. Chronic constipation Continue Miralax BID.   4. Migraine without aura and without status migrainosus, not intractable Stable. Continue Maxalt prn.   5. Encounter for lipid screening for cardiovascular disease Will check labs as below and f/u pending results. - Lipid Panel With LDL/HDL Ratio  6. Fibromyalgia Discussed physical activity. May consider trial of cymbalta for chronic pain and myalgias.   7. BV (bacterial vaginosis) Recurrent. Will treat with flagyl as below. Call if symptoms worsen or return after treatment. Will give metrogel as well for her to use intravaginally after intercourse for prophylaxis.  - metroNIDAZOLE (FLAGYL) 500 MG tablet; Take 1 tablet (500 mg total) by mouth 2 (two) times daily.  Dispense: 14 tablet; Refill: 0  --------------------------------------------------------------------    [name], PA-C  [facility]

## 2017-11-20 NOTE — Patient Instructions (Signed)
Myofascial Pain Syndrome and Fibromyalgia Myofascial pain syndrome and fibromyalgia are both pain disorders. This pain may be felt mainly in your muscles.  Myofascial pain syndrome: ? Always has trigger points or tender points in the muscle that will cause pain when pressed. The pain may come and go. ? Usually affects your neck, upper back, and shoulder areas. The pain often radiates into your arms and hands.  Fibromyalgia: ? Has muscle pains and tenderness that come and go. ? Is often associated with fatigue and sleep disturbances. ? Has trigger points. ? Tends to be long-lasting (chronic), but is not life-threatening.  Fibromyalgia and myofascial pain are not the same. However, they often occur together. If you have both conditions, each can make the other worse. Both are common and can cause enough pain and fatigue to make day-to-day activities difficult. What are the causes? The exact causes of fibromyalgia and myofascial pain are not known. People with certain gene types may be more likely to develop fibromyalgia. Some factors can be triggers for both conditions, such as:  Spine disorders.  Arthritis.  Severe injury (trauma) and other physical stressors.  Being under a lot of stress.  A medical illness.  What are the signs or symptoms? Fibromyalgia The main symptom of fibromyalgia is widespread pain and tenderness in your muscles. This can vary over time. Pain is sometimes described as stabbing, shooting, or burning. You may have tingling or numbness, too. You may also have sleep problems and fatigue. You may wake up feeling tired and groggy (fibro fog). Other symptoms may include:  Bowel and bladder problems.  Headaches.  Visual problems.  Problems with odors and noises.  Depression or mood changes.  Painful menstrual periods (dysmenorrhea).  Dry skin or eyes.  Myofascial pain syndrome Symptoms of myofascial pain syndrome include:  Tight, ropy bands of  muscle.  Uncomfortable sensations in muscular areas, such as: ? Aching. ? Cramping. ? Burning. ? Numbness. ? Tingling. ? Muscle weakness.  Trouble moving certain muscles freely (range of motion).  How is this diagnosed? There are no specific tests to diagnose fibromyalgia or myofascial pain syndrome. Both can be hard to diagnose because their symptoms are common in many other conditions. Your health care provider may suspect one or both of these conditions based on your symptoms and medical history. Your health care provider will also do a physical exam. The key to diagnosing fibromyalgia is having pain, fatigue, and other symptoms for more than three months that cannot be explained by another condition. The key to diagnosing myofascial pain syndrome is finding trigger points in muscles that are tender and cause pain elsewhere in your body (referred pain). How is this treated? Treating fibromyalgia and myofascial pain often requires a team of health care providers. This usually starts with your primary provider and a physical therapist. You may also find it helpful to work with alternative health care providers, such as massage therapists or acupuncturists. Treatment for fibromyalgia may include medicines. This may include nonsteroidal anti-inflammatory drugs (NSAIDs), along with other medicines. Treatment for myofascial pain may also include:  NSAIDs.  Cooling and stretching of muscles.  Trigger point injections.  Sound wave (ultrasound) treatments to stimulate muscles.  Follow these instructions at home:  Take medicines only as directed by your health care provider.  Exercise as directed by your health care provider or physical therapist.  Try to avoid stressful situations.  Practice relaxation techniques to control your stress. You may want to try: ? Biofeedback. ? Visual   imagery. ? Hypnosis. ? Muscle relaxation. ? Yoga. ? Meditation.  Talk to your health care provider  about alternative treatments, such as acupuncture or massage treatment.  Maintain a healthy lifestyle. This includes eating a healthy diet and getting enough sleep.  Consider joining a support group.  Do not do activities that stress or strain your muscles. That includes repetitive motions and heavy lifting. Where to find more information:  National Fibromyalgia Association: www.fmaware.org  Arthritis Foundation: www.arthritis.org  American Chronic Pain Association: www.theacpa.org/condition/myofascial-pain Contact a health care provider if:  You have new symptoms.  Your symptoms get worse.  You have side effects from your medicines.  You have trouble sleeping.  Your condition is causing depression or anxiety. This information is not intended to replace advice given to you by your health care provider. Make sure you discuss any questions you have with your health care provider. Document Released: 02/11/2005 Document Revised: 07/20/2015 Document Reviewed: 11/17/2013 Elsevier Interactive Patient Education  2018 Elsevier Inc.  

## 2017-11-21 ENCOUNTER — Encounter: Payer: Self-pay | Admitting: Physician Assistant

## 2017-11-21 ENCOUNTER — Telehealth: Payer: Self-pay

## 2017-11-21 ENCOUNTER — Telehealth: Payer: Self-pay | Admitting: Physician Assistant

## 2017-11-21 DIAGNOSIS — R112 Nausea with vomiting, unspecified: Secondary | ICD-10-CM

## 2017-11-21 DIAGNOSIS — N760 Acute vaginitis: Principal | ICD-10-CM

## 2017-11-21 DIAGNOSIS — B9689 Other specified bacterial agents as the cause of diseases classified elsewhere: Secondary | ICD-10-CM

## 2017-11-21 LAB — CBC WITH DIFFERENTIAL/PLATELET
Basophils Absolute: 0.1 10*3/uL (ref 0.0–0.2)
Basos: 1 %
EOS (ABSOLUTE): 0.1 10*3/uL (ref 0.0–0.4)
Eos: 1 %
Hematocrit: 40.5 % (ref 34.0–46.6)
Hemoglobin: 13.8 g/dL (ref 11.1–15.9)
Immature Grans (Abs): 0 10*3/uL (ref 0.0–0.1)
Immature Granulocytes: 0 %
Lymphocytes Absolute: 2.8 10*3/uL (ref 0.7–3.1)
Lymphs: 29 %
MCH: 31.2 pg (ref 26.6–33.0)
MCHC: 34.1 g/dL (ref 31.5–35.7)
MCV: 91 fL (ref 79–97)
Monocytes Absolute: 0.6 10*3/uL (ref 0.1–0.9)
Monocytes: 6 %
Neutrophils Absolute: 6 10*3/uL (ref 1.4–7.0)
Neutrophils: 63 %
Platelets: 241 10*3/uL (ref 150–450)
RBC: 4.43 x10E6/uL (ref 3.77–5.28)
RDW: 12 % — ABNORMAL LOW (ref 12.3–15.4)
WBC: 9.6 10*3/uL (ref 3.4–10.8)

## 2017-11-21 LAB — COMPREHENSIVE METABOLIC PANEL
ALT: 10 IU/L (ref 0–32)
AST: 11 IU/L (ref 0–40)
Albumin/Globulin Ratio: 1.8 (ref 1.2–2.2)
Albumin: 4.7 g/dL (ref 3.5–5.5)
Alkaline Phosphatase: 75 IU/L (ref 39–117)
BUN/Creatinine Ratio: 18 (ref 9–23)
BUN: 13 mg/dL (ref 6–20)
Bilirubin Total: 0.3 mg/dL (ref 0.0–1.2)
CO2: 23 mmol/L (ref 20–29)
Calcium: 9.5 mg/dL (ref 8.7–10.2)
Chloride: 104 mmol/L (ref 96–106)
Creatinine, Ser: 0.73 mg/dL (ref 0.57–1.00)
GFR calc Af Amer: 120 mL/min/{1.73_m2} (ref >59)
GFR calc non Af Amer: 104 mL/min/{1.73_m2} (ref >59)
Globulin, Total: 2.6 g/dL (ref 1.5–4.5)
Glucose: 84 mg/dL (ref 65–99)
Potassium: 4.1 mmol/L (ref 3.5–5.2)
Sodium: 144 mmol/L (ref 134–144)
Total Protein: 7.3 g/dL (ref 6.0–8.5)

## 2017-11-21 LAB — LIPID PANEL WITH LDL/HDL RATIO
Cholesterol, Total: 189 mg/dL (ref 100–199)
HDL: 53 mg/dL (ref >39)
LDL Calculated: 112 mg/dL — ABNORMAL HIGH (ref 0–99)
LDl/HDL Ratio: 2.1 ratio (ref 0.0–3.2)
Triglycerides: 121 mg/dL (ref 0–149)
VLDL Cholesterol Cal: 24 mg/dL (ref 5–40)

## 2017-11-21 LAB — HEMOGLOBIN A1C
Est. average glucose Bld gHb Est-mCnc: 94 mg/dL
Hgb A1c MFr Bld: 4.9 % (ref 4.8–5.6)

## 2017-11-21 LAB — TSH: TSH: 1.47 u[IU]/mL (ref 0.450–4.500)

## 2017-11-21 MED ORDER — METRONIDAZOLE 0.75 % VA GEL: VAGINAL | 0 refills | Status: DC

## 2017-11-21 MED ORDER — ONDANSETRON HCL 4 MG PO TABS: 4.0000 mg | ORAL_TABLET | Freq: Three times a day (TID) | ORAL | 0 refills | Status: DC | PRN

## 2017-11-21 NOTE — Telephone Encounter (Signed)
Changed order.

## 2017-11-21 NOTE — Telephone Encounter (Signed)
-----   Message from [name], [place] sent at 11/21/2017 11:49 AM EDT ----- All labs are within normal limits and stable with exception of LDL which is only borderline high at 112. Would like below 100. Continue healthy lifestyle modifications.  Thanks! -JB

## 2017-11-21 NOTE — Telephone Encounter (Signed)
LMTCB

## 2017-11-21 NOTE — Telephone Encounter (Signed)
Please clarify so I cant contact pharmacist. Thanks. KW

## 2017-11-21 NOTE — Telephone Encounter (Signed)
[name] w/ pharmacy called asking if  [name] intended for the metroNIDAZOLE (METROGEL) 0.75 % gel to be prescribed as the vaginal gel at .75% with 70 gram packaging?  Please call [name], the pharmacist to have this clarified and or changed since the instructions were for vaginal use.  Please advise.  Thanks, [employer]

## 2017-11-24 ENCOUNTER — Encounter: Payer: Self-pay | Admitting: Physician Assistant

## 2017-11-24 DIAGNOSIS — G2581 Restless legs syndrome: Secondary | ICD-10-CM

## 2017-11-24 MED ORDER — GABAPENTIN 400 MG PO CAPS: 400.0000 mg | ORAL_CAPSULE | Freq: Two times a day (BID) | ORAL | 1 refills | Status: DC

## 2017-11-24 NOTE — Telephone Encounter (Signed)
Viewed by Ethelle Lyon on 11/21/2017 2:08 PM

## 2017-12-01 ENCOUNTER — Encounter: Payer: Self-pay | Admitting: Physician Assistant

## 2017-12-01 DIAGNOSIS — M25551 Pain in right hip: Secondary | ICD-10-CM

## 2017-12-01 DIAGNOSIS — M25552 Pain in left hip: Principal | ICD-10-CM

## 2017-12-01 DIAGNOSIS — M545 Low back pain, unspecified: Secondary | ICD-10-CM

## 2017-12-01 DIAGNOSIS — M533 Sacrococcygeal disorders, not elsewhere classified: Secondary | ICD-10-CM

## 2017-12-01 NOTE — Addendum Note (Signed)
Addended by: [name] on: 12/01/2017 04:02 PM   Modules accepted: Orders

## 2017-12-02 ENCOUNTER — Ambulatory Visit
Admission: RE | Admit: 2017-12-02 | Discharge: 2017-12-02 | Disposition: A | Discharge status: Home / Self Care | Payer: BLUE CROSS/BLUE SHIELD | Source: Ambulatory Visit | Attending: Physician Assistant | Admitting: Physician Assistant

## 2017-12-02 ENCOUNTER — Telehealth: Payer: Self-pay

## 2017-12-02 ENCOUNTER — Other Ambulatory Visit: Payer: Self-pay | Admitting: Physician Assistant

## 2017-12-02 DIAGNOSIS — M25551 Pain in right hip: Secondary | ICD-10-CM

## 2017-12-02 DIAGNOSIS — M25552 Pain in left hip: Secondary | ICD-10-CM | POA: Insufficient documentation

## 2017-12-02 NOTE — Telephone Encounter (Signed)
Patient advised as directed below.  Thanks,  -Joseline 

## 2017-12-02 NOTE — Telephone Encounter (Signed)
-----   Message from [name], PA-C sent at 12/02/2017  2:23 PM EDT ----- Hips are normal.

## 2017-12-03 ENCOUNTER — Other Ambulatory Visit: Payer: Self-pay | Admitting: Physician Assistant

## 2017-12-09 DIAGNOSIS — M4716 Other spondylosis with myelopathy, lumbar region: Secondary | ICD-10-CM | POA: Diagnosis not present

## 2017-12-09 DIAGNOSIS — M549 Dorsalgia, unspecified: Secondary | ICD-10-CM | POA: Diagnosis not present

## 2017-12-09 DIAGNOSIS — M791 Myalgia, unspecified site: Secondary | ICD-10-CM | POA: Diagnosis not present

## 2017-12-09 DIAGNOSIS — M545 Low back pain: Secondary | ICD-10-CM | POA: Diagnosis not present

## 2017-12-12 DIAGNOSIS — M542 Cervicalgia: Secondary | ICD-10-CM | POA: Diagnosis not present

## 2017-12-12 DIAGNOSIS — M791 Myalgia, unspecified site: Secondary | ICD-10-CM | POA: Diagnosis not present

## 2017-12-12 DIAGNOSIS — G518 Other disorders of facial nerve: Secondary | ICD-10-CM | POA: Diagnosis not present

## 2017-12-12 DIAGNOSIS — G43719 Chronic migraine without aura, intractable, without status migrainosus: Secondary | ICD-10-CM | POA: Diagnosis not present

## 2017-12-22 ENCOUNTER — Other Ambulatory Visit (HOSPITAL_COMMUNITY): Payer: Self-pay | Admitting: Orthopaedic Surgery

## 2017-12-22 DIAGNOSIS — M791 Myalgia, unspecified site: Secondary | ICD-10-CM

## 2017-12-26 DIAGNOSIS — M5136 Other intervertebral disc degeneration, lumbar region: Secondary | ICD-10-CM | POA: Insufficient documentation

## 2018-01-01 ENCOUNTER — Ambulatory Visit (HOSPITAL_COMMUNITY)
Admission: RE | Admit: 2018-01-01 | Discharge: 2018-01-01 | Disposition: A | Discharge status: Home / Self Care | Payer: BLUE CROSS/BLUE SHIELD | Source: Ambulatory Visit | Attending: Orthopaedic Surgery | Admitting: Orthopaedic Surgery

## 2018-01-01 DIAGNOSIS — M791 Myalgia, unspecified site: Secondary | ICD-10-CM | POA: Insufficient documentation

## 2018-01-01 DIAGNOSIS — M545 Low back pain: Secondary | ICD-10-CM | POA: Diagnosis not present

## 2018-01-05 DIAGNOSIS — G43719 Chronic migraine without aura, intractable, without status migrainosus: Secondary | ICD-10-CM | POA: Diagnosis not present

## 2018-01-08 DIAGNOSIS — M47816 Spondylosis without myelopathy or radiculopathy, lumbar region: Secondary | ICD-10-CM | POA: Diagnosis not present

## 2018-01-08 DIAGNOSIS — M545 Low back pain: Secondary | ICD-10-CM | POA: Diagnosis not present

## 2018-01-08 DIAGNOSIS — M5136 Other intervertebral disc degeneration, lumbar region: Secondary | ICD-10-CM | POA: Diagnosis not present

## 2018-01-13 ENCOUNTER — Ambulatory Visit: Payer: BLUE CROSS/BLUE SHIELD | Admitting: Gastroenterology

## 2018-01-19 DIAGNOSIS — M47816 Spondylosis without myelopathy or radiculopathy, lumbar region: Secondary | ICD-10-CM | POA: Diagnosis not present

## 2018-01-21 DIAGNOSIS — G518 Other disorders of facial nerve: Secondary | ICD-10-CM | POA: Diagnosis not present

## 2018-01-21 DIAGNOSIS — M542 Cervicalgia: Secondary | ICD-10-CM | POA: Diagnosis not present

## 2018-01-21 DIAGNOSIS — G43719 Chronic migraine without aura, intractable, without status migrainosus: Secondary | ICD-10-CM | POA: Diagnosis not present

## 2018-01-21 DIAGNOSIS — M791 Myalgia, unspecified site: Secondary | ICD-10-CM | POA: Diagnosis not present

## 2018-01-29 DIAGNOSIS — Z6831 Body mass index (BMI) 31.0-31.9, adult: Secondary | ICD-10-CM | POA: Diagnosis not present

## 2018-01-29 DIAGNOSIS — M7918 Myalgia, other site: Secondary | ICD-10-CM | POA: Diagnosis not present

## 2018-01-29 DIAGNOSIS — M47816 Spondylosis without myelopathy or radiculopathy, lumbar region: Secondary | ICD-10-CM | POA: Diagnosis not present

## 2018-01-29 DIAGNOSIS — I10 Essential (primary) hypertension: Secondary | ICD-10-CM | POA: Diagnosis not present

## 2018-03-02 DIAGNOSIS — N200 Calculus of kidney: Secondary | ICD-10-CM | POA: Diagnosis not present

## 2018-03-02 DIAGNOSIS — R8290 Unspecified abnormal findings in urine: Secondary | ICD-10-CM | POA: Diagnosis not present

## 2018-03-03 DIAGNOSIS — G518 Other disorders of facial nerve: Secondary | ICD-10-CM | POA: Diagnosis not present

## 2018-03-03 DIAGNOSIS — G43719 Chronic migraine without aura, intractable, without status migrainosus: Secondary | ICD-10-CM | POA: Diagnosis not present

## 2018-03-03 DIAGNOSIS — M542 Cervicalgia: Secondary | ICD-10-CM | POA: Diagnosis not present

## 2018-03-03 DIAGNOSIS — M791 Myalgia, unspecified site: Secondary | ICD-10-CM | POA: Diagnosis not present

## 2018-03-05 ENCOUNTER — Other Ambulatory Visit: Payer: Self-pay

## 2018-03-05 ENCOUNTER — Encounter: Payer: Self-pay | Admitting: Urology

## 2018-03-05 ENCOUNTER — Ambulatory Visit
Admission: RE | Admit: 2018-03-05 | Discharge: 2018-03-05 | Disposition: A | Discharge status: Home / Self Care | Payer: BLUE CROSS/BLUE SHIELD | Attending: Urology | Admitting: Urology

## 2018-03-05 ENCOUNTER — Ambulatory Visit
Admission: RE | Admit: 2018-03-05 | Discharge: 2018-03-05 | Disposition: A | Discharge status: Home / Self Care | Payer: BLUE CROSS/BLUE SHIELD | Source: Ambulatory Visit | Attending: Urology | Admitting: Urology

## 2018-03-05 DIAGNOSIS — N200 Calculus of kidney: Secondary | ICD-10-CM

## 2018-03-05 DIAGNOSIS — N132 Hydronephrosis with renal and ureteral calculous obstruction: Secondary | ICD-10-CM | POA: Diagnosis not present

## 2018-03-05 NOTE — Progress Notes (Signed)
Patient sent mychart message stating she is having pain and would like to get a KUB. UA was done at urgent care no infection noted was started on abx.  I placed an order and told her we would call with results FYI

## 2018-03-10 NOTE — Progress Notes (Signed)
Patient: [name] Female    DOB: [birth year]   38 y.o.   MRN: [id number] Visit Date: 03/11/2018  Today's Provider: [name], PA-C   Chief Complaint  Patient presents with  . Obesity   Subjective:     HPI   Patient is here to discuss starting weight loss medication phentermine. She has made dietary changes and is walking and doing yoga. She has used phentermine in the past, but reports it was almost 10 years ago. She remembers tolerating well and being successful.  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    Allergies  Allergen Reactions  . Compazine Other (See Comments)    Dystonia  . Metoclopramide Other (See Comments)    dystonia     Current Outpatient Medications:  .  ALPRAZolam (XANAX) 1 MG tablet, TAKE 1/2 TO 1 TABLET BY MOUTH 3 TIMES A DAY AS NEEDED FOR ANXIETY, Disp: 90 tablet, Rfl: 5 .  baclofen (LIORESAL) 10 MG tablet, Take 10 mg by mouth 2 (two) times daily as needed (migraines). , Disp: , Rfl: 0 .  chlorproMAZINE (THORAZINE) 25 MG tablet, take 25mg  by mouth every 4 to 6 hours as needed for migraines, Disp: , Rfl:  .  Cholecalciferol (VITAMIN D3) 5000 units CAPS, Take 5,000 Units by mouth daily., Disp: , Rfl:  .  gabapentin (NEURONTIN) 400 MG capsule, Take 1 capsule (400 mg total) by mouth 2 (two) times daily., Disp: 180 capsule, Rfl: 1 .  metroNIDAZOLE (METROGEL) 0.75 % vaginal gel, Use one applicator intravaginally after sexual intercourse, Disp: 70 g, Rfl: 0 .  Multiple Vitamin (MULTIVITAMIN WITH MINERALS) TABS tablet, Take 1 tablet by mouth daily., Disp: , Rfl:  .  omeprazole (PRILOSEC) 20 MG capsule, TAKE 1 CAPSULE BY MOUTH ONCE DAILY, Disp: 30 capsule, Rfl: 2 .  OnabotulinumtoxinA (BOTOX IJ), Inject 1 each as directed every 3 (three) months. Alternating with trigger point inj, Disp: , Rfl:  .  polyethylene glycol (MIRALAX / GLYCOLAX) packet, Take 17 g by mouth daily., Disp: , Rfl:  .  promethazine  (PHENERGAN) 25 MG tablet, Take 1 tablet (25 mg total) by mouth every 8 (eight) hours as needed for nausea or vomiting., Disp: 30 tablet, Rfl: 0 .  valACYclovir (VALTREX) 500 MG tablet, Take 2 tablets (1,000 mg total) by mouth 2 (two) times daily as needed (cold sores)., Disp: 20 tablet, Rfl: 5 .  metroNIDAZOLE (FLAGYL) 500 MG tablet, Take 1 tablet (500 mg total) by mouth 2 (two) times daily. (Patient not taking: Reported on 03/11/2018), Disp: 14 tablet, Rfl: 0 .  ondansetron (ZOFRAN) 4 MG tablet, Take 1 tablet (4 mg total) by mouth every 8 (eight) hours as needed. (Patient not taking: Reported on 03/11/2018), Disp: 20 tablet, Rfl: 0  Review of Systems  Constitutional: Negative for appetite change, chills, fatigue and fever.  Respiratory: Negative for chest tightness and shortness of breath.   Cardiovascular: Negative for chest pain and palpitations.  Gastrointestinal: Negative for abdominal pain, nausea and vomiting.  Genitourinary: Negative.   Neurological: Negative for dizziness and weakness.    Social History   Tobacco Use  . Smoking status: Former Smoker    Packs/day: 0.50    Years: 15.00    Pack years: 7.50    Types: Cigarettes    Start date: 11/03/1999    Last attempt to quit: 09/26/2014    Years since quitting: 3.4  . Smokeless tobacco: Never Used  Substance  Use Topics  . Alcohol use: Yes    Alcohol/week: 0.0 standard drinks    Comment: social-rare      Objective:   BP 120/64 (BP Location: Left Arm, Patient Position: Sitting, Cuff Size: Large)   Pulse 70   Temp 97.9 F (36.6 C) (Oral)   Resp 16   Ht 5\' 7"  (1.702 m)   Wt 203 lb (92.1 kg)   SpO2 99%   BMI 31.79 kg/m  Vitals:   03/11/18 1051  BP: 120/64  Pulse: 70  Resp: 16  Temp: 97.9 F (36.6 C)  TempSrc: Oral  SpO2: 99%  Weight: 203 lb (92.1 kg)  Height: 5\' 7"  (1.702 m)     Physical Exam Vitals signs reviewed.  Constitutional:      General: She is not in acute distress.    Appearance: She is  well-developed. She is obese. She is not diaphoretic.  Neck:     Musculoskeletal: Normal range of motion and neck supple.  Cardiovascular:     Rate and Rhythm: Normal rate and regular rhythm.     Heart sounds: Normal heart sounds. No murmur. No friction rub. No gallop.   Pulmonary:     Effort: Pulmonary effort is normal. No respiratory distress.     Breath sounds: Normal breath sounds. No wheezing or rales.  Psychiatric:        Mood and Affect: Mood normal.        Behavior: Behavior normal.         Assessment & Plan    1. Encounter for weight loss counseling Will start phentermine as below. Discussed food diary and trying to stay around 1200 calories daily. Continue exercise and increase as tolerated. Return in 3 months for weight check if continuation desired.  - phentermine (ADIPEX-P) 37.5 MG tablet; Take 1 tablet (37.5 mg total) by mouth daily before breakfast.  Dispense: 30 tablet; Refill: 2  2. Class 1 obesity due to excess calories without serious comorbidity with body mass index (BMI) of 31.0 to 31.9 in adult See above medical treatment plan. - phentermine (ADIPEX-P) 37.5 MG tablet; Take 1 tablet (37.5 mg total) by mouth daily before breakfast.  Dispense: 30 tablet; Refill: 2  3. Herpes simplex Stable. Diagnosis pulled for medication refill. Continue current medical treatment plan. - valACYclovir (VALTREX) 500 MG tablet; Take 2 tablets (1,000 mg total) by mouth 2 (two) times daily as needed (cold sores).  Dispense: 20 tablet; Refill: 5  4. BV (bacterial vaginosis) Stable. Diagnosis pulled for medication refill. Continue current medical treatment plan. - metroNIDAZOLE (METROGEL) 0.75 % vaginal gel; Use one applicator intravaginally after sexual intercourse  Dispense: 70 g; Refill: 3     [name], PA-C  [facility]

## 2018-03-11 ENCOUNTER — Ambulatory Visit: Payer: BLUE CROSS/BLUE SHIELD | Admitting: Physician Assistant

## 2018-03-11 ENCOUNTER — Encounter: Payer: Self-pay | Admitting: Physician Assistant

## 2018-03-11 VITALS — BP 120/64 | HR 70 | Temp 97.9°F | Resp 16 | Ht 67.0 in | Wt 203.0 lb

## 2018-03-11 DIAGNOSIS — E6609 Other obesity due to excess calories: Secondary | ICD-10-CM

## 2018-03-11 DIAGNOSIS — Z713 Dietary counseling and surveillance: Secondary | ICD-10-CM

## 2018-03-11 DIAGNOSIS — N760 Acute vaginitis: Secondary | ICD-10-CM

## 2018-03-11 DIAGNOSIS — B009 Herpesviral infection, unspecified: Secondary | ICD-10-CM

## 2018-03-11 DIAGNOSIS — B9689 Other specified bacterial agents as the cause of diseases classified elsewhere: Secondary | ICD-10-CM

## 2018-03-11 DIAGNOSIS — E66811 Obesity, class 1: Secondary | ICD-10-CM

## 2018-03-11 DIAGNOSIS — Z6831 Body mass index (BMI) 31.0-31.9, adult: Secondary | ICD-10-CM

## 2018-03-11 MED ORDER — METRONIDAZOLE 0.75 % VA GEL: VAGINAL | 3 refills | Status: DC

## 2018-03-11 MED ORDER — PHENTERMINE HCL 37.5 MG PO TABS: 37.5000 mg | ORAL_TABLET | Freq: Every day | ORAL | 2 refills | Status: DC

## 2018-03-11 MED ORDER — VALACYCLOVIR HCL 500 MG PO TABS: 1000.0000 mg | ORAL_TABLET | Freq: Two times a day (BID) | ORAL | 5 refills | Status: AC | PRN

## 2018-03-16 DIAGNOSIS — G43719 Chronic migraine without aura, intractable, without status migrainosus: Secondary | ICD-10-CM | POA: Diagnosis not present

## 2018-03-16 DIAGNOSIS — M542 Cervicalgia: Secondary | ICD-10-CM | POA: Diagnosis not present

## 2018-03-16 DIAGNOSIS — G518 Other disorders of facial nerve: Secondary | ICD-10-CM | POA: Diagnosis not present

## 2018-03-16 DIAGNOSIS — M791 Myalgia, unspecified site: Secondary | ICD-10-CM | POA: Diagnosis not present

## 2018-03-16 NOTE — Progress Notes (Signed)
Patient: [name] Female    DOB: 30-Dec-1980   38 y.o.   MRN: [id number] Visit Date: 03/18/2018  Today's Provider: [name], PA-C   Chief Complaint  Patient presents with  . Hip Problem   Subjective:     HPI   Patient is here to get injections in her hips. She has been having worsening greater trochanteric bursitis bilaterally. She has had injections previously with good success.   Allergies  Allergen Reactions  . Compazine Other (See Comments)    Dystonia  . Metoclopramide Other (See Comments)    dystonia     Current Outpatient Medications:  .  ALPRAZolam (XANAX) 1 MG tablet, TAKE 1/2 TO 1 TABLET BY MOUTH 3 TIMES A DAY AS NEEDED FOR ANXIETY, Disp: 90 tablet, Rfl: 5 .  baclofen (LIORESAL) 10 MG tablet, Take 10 mg by mouth 2 (two) times daily as needed (migraines). , Disp: , Rfl: 0 .  chlorproMAZINE (THORAZINE) 25 MG tablet, take 25mg  by mouth every 4 to 6 hours as needed for migraines, Disp: , Rfl:  .  Cholecalciferol (VITAMIN D3) 5000 units CAPS, Take 5,000 Units by mouth daily., Disp: , Rfl:  .  gabapentin (NEURONTIN) 400 MG capsule, Take 1 capsule (400 mg total) by mouth 2 (two) times daily., Disp: 180 capsule, Rfl: 1 .  metroNIDAZOLE (METROGEL) 0.75 % vaginal gel, Use one applicator intravaginally after sexual intercourse, Disp: 70 g, Rfl: 3 .  Multiple Vitamin (MULTIVITAMIN WITH MINERALS) TABS tablet, Take 1 tablet by mouth daily., Disp: , Rfl:  .  omeprazole (PRILOSEC) 20 MG capsule, TAKE 1 CAPSULE BY MOUTH ONCE DAILY, Disp: 30 capsule, Rfl: 2 .  OnabotulinumtoxinA (BOTOX IJ), Inject 1 each as directed every 3 (three) months. Alternating with trigger point inj, Disp: , Rfl:  .  ondansetron (ZOFRAN) 4 MG tablet, Take 1 tablet (4 mg total) by mouth every 8 (eight) hours as needed., Disp: 20 tablet, Rfl: 0 .  phentermine (ADIPEX-P) 37.5 MG tablet, Take 1 tablet (37.5 mg total) by mouth daily before breakfast., Disp: 30 tablet, Rfl: 2 .   polyethylene glycol (MIRALAX / GLYCOLAX) packet, Take 17 g by mouth daily., Disp: , Rfl:  .  promethazine (PHENERGAN) 25 MG tablet, Take 1 tablet (25 mg total) by mouth every 8 (eight) hours as needed for nausea or vomiting., Disp: 30 tablet, Rfl: 0 .  valACYclovir (VALTREX) 500 MG tablet, Take 2 tablets (1,000 mg total) by mouth 2 (two) times daily as needed (cold sores)., Disp: 20 tablet, Rfl: 5  Review of Systems  Constitutional: Negative for appetite change, chills, fatigue and fever.  Respiratory: Negative for chest tightness and shortness of breath.   Cardiovascular: Negative for chest pain and palpitations.  Gastrointestinal: Negative for abdominal pain, nausea and vomiting.  Musculoskeletal: Positive for arthralgias.  Neurological: Negative for dizziness and weakness.    Social History   Tobacco Use  . Smoking status: Former Smoker    Packs/day: 0.50    Years: 15.00    Pack years: 7.50    Types: Cigarettes    Start date: 11/03/1999    Last attempt to quit: 09/26/2014    Years since quitting: 3.4  . Smokeless tobacco: Never Used  Substance Use Topics  . Alcohol use: Yes    Alcohol/week: 0.0 standard drinks    Comment: social-rare      Objective:   BP 96/69 (BP Location: Left Arm, Patient Position: Sitting, Cuff Size: Large)   Pulse  78   Temp 97.9 F (36.6 C) (Oral)   Resp 16   Wt 200 lb (90.7 kg)   SpO2 99%   BMI 31.32 kg/m  Vitals:   03/18/18 1521  BP: 96/69  Pulse: 78  Resp: 16  Temp: 97.9 F (36.6 C)  TempSrc: Oral  SpO2: 99%  Weight: 200 lb (90.7 kg)     Physical Exam Vitals signs reviewed.  Constitutional:      General: She is not in acute distress.    Appearance: She is well-developed. She is not diaphoretic.  Neck:     Musculoskeletal: Normal range of motion and neck supple.  Cardiovascular:     Rate and Rhythm: Normal rate and regular rhythm.     Heart sounds: Normal heart sounds. No murmur. No friction rub. No gallop.   Pulmonary:      Effort: Pulmonary effort is normal. No respiratory distress.     Breath sounds: Normal breath sounds. No wheezing or rales.  Musculoskeletal:     Right hip: She exhibits bony tenderness (greater trochanter tenderness).     Left hip: She exhibits bony tenderness (grerater trochanter tenderness).        Assessment & Plan    1. Trochanteric bursitis of both hips Depo medrol injection given bilaterally. See procedure note below. After care instructions given on AVS.  - methylPREDNISolone acetate (DEPO-MEDROL) injection 40 mg - methylPREDNISolone acetate (DEPO-MEDROL) injection 40 mg  Procedure Note: Benefits, risks (including infection, tattooing, adipose dimpling, and tendon rupture) and alternatives were explained to the patient. All questions were sought and answered.  Patient agreed to continue and verbal consent was obtained.   An aspiration and steroid injection was performed on right hip and left hip using 4cc of 1% plain Xyloocaine and 40 mg of depo-medrol. There was minimal bleeding. Hemostasis was intact. A dry dressing was applied. The procedure was well tolerated.     Margaretann LovelessJennifer M , PA-C  [facility]

## 2018-03-18 ENCOUNTER — Encounter: Payer: Self-pay | Admitting: Physician Assistant

## 2018-03-18 ENCOUNTER — Ambulatory Visit: Payer: BLUE CROSS/BLUE SHIELD | Admitting: Physician Assistant

## 2018-03-18 VITALS — BP 96/69 | HR 78 | Temp 97.9°F | Resp 16 | Wt 200.0 lb

## 2018-03-18 DIAGNOSIS — M7062 Trochanteric bursitis, left hip: Secondary | ICD-10-CM | POA: Diagnosis not present

## 2018-03-18 DIAGNOSIS — M7061 Trochanteric bursitis, right hip: Secondary | ICD-10-CM | POA: Diagnosis not present

## 2018-03-18 MED ORDER — METHYLPREDNISOLONE ACETATE 40 MG/ML IJ SUSP
40.0000 mg | Freq: Once | INTRAMUSCULAR | Status: AC
  Administered 2018-03-18: 40 mg via INTRAMUSCULAR

## 2018-03-18 NOTE — Patient Instructions (Signed)
Hip Injection, Care After Refer to this sheet in the next few weeks. These instructions provide you with information about caring for yourself after your procedure. Your health care provider may also give you more specific instructions. Your treatment has been planned according to current medical practices, but problems sometimes occur. Call your health care provider if you have any problems or questions after your procedure. WHAT TO EXPECT AFTER THE PROCEDURE After your procedure, it is common to have:  Soreness.  Warmth.  Swelling. You may have more pain, swelling, and warmth than you did before the injection. This reaction may last for about one day.  HOME CARE INSTRUCTIONS Bathing  If you were given a bandage (dressing), keep it dry until your health care provider says it can be removed. Ask your health care provider when you can start showering or taking a bath.  Managing Pain, Stiffness, and Swelling  If directed, apply ice to the injection area:  Put ice in a plastic bag.  Place a towel between your skin and the bag.  Leave the ice on for 20 minutes, 2-3 times per day.  Do not apply heat to your hip.  Raise the injection area above the level of your heart while you are sitting or lying down. Activity  Avoid strenuous activities for as long as directed by your health care provider. Ask your health care provider when you can return to your normal activities.  General Instructions  Take medicines only as directed by your health care provider.  Do not take aspirin or other over-the-counter medicines unless your health care provider says you can.  Check your injection site every day for signs of infection. Watch for:  Redness, swelling, or pain.  Fluid, blood, or pus.  Follow your health care provider's instructions about dressing changes and removal. SEEK MEDICAL CARE IF:  You have symptoms at your injection site that last longer than two days after your  procedure.  You have redness, swelling, or pain in your injection area.  You have fluid, blood, or pus coming from your injection site.  You have warmth in your injection area.  You have a fever.  Your pain is not controlled with medicine. SEEK IMMEDIATE MEDICAL CARE IF:  Your hip turns very red.  Your hip becomes very swollen.  Your hip pain is severe.   This information is not intended to replace advice given to you by your health care provider. Make sure you discuss any questions you have with your health care provider.   Document Released: 03/04/2014 Document Reviewed: 03/04/2014 Elsevier Interactive Patient Education 2016 [employer]  

## 2018-04-15 ENCOUNTER — Encounter: Payer: Self-pay | Admitting: Physician Assistant

## 2018-04-16 ENCOUNTER — Ambulatory Visit: Payer: BLUE CROSS/BLUE SHIELD | Admitting: Physician Assistant

## 2018-04-16 ENCOUNTER — Other Ambulatory Visit: Payer: Self-pay | Admitting: *Deleted

## 2018-04-16 ENCOUNTER — Encounter: Payer: Self-pay | Admitting: Physician Assistant

## 2018-04-16 VITALS — BP 106/74 | HR 91 | Temp 98.0°F | Resp 16 | Wt 195.0 lb

## 2018-04-16 DIAGNOSIS — F5101 Primary insomnia: Secondary | ICD-10-CM

## 2018-04-16 DIAGNOSIS — R300 Dysuria: Secondary | ICD-10-CM

## 2018-04-16 DIAGNOSIS — F411 Generalized anxiety disorder: Secondary | ICD-10-CM

## 2018-04-16 DIAGNOSIS — R102 Pelvic and perineal pain: Secondary | ICD-10-CM

## 2018-04-16 LAB — POCT URINALYSIS DIPSTICK
Bilirubin, UA: NEGATIVE
Glucose, UA: NEGATIVE
Ketones, UA: NEGATIVE
Leukocytes, UA: NEGATIVE
Nitrite, UA: NEGATIVE
Protein, UA: NEGATIVE
Spec Grav, UA: 1.01 (ref 1.010–1.025)
Urobilinogen, UA: 0.2 E.U./dL
pH, UA: 6.5 (ref 5.0–8.0)

## 2018-04-16 MED ORDER — OXYCODONE-ACETAMINOPHEN 5-325 MG PO TABS: 1.0000 | ORAL_TABLET | Freq: Four times a day (QID) | ORAL | 0 refills | Status: DC | PRN

## 2018-04-16 MED ORDER — SULFAMETHOXAZOLE-TRIMETHOPRIM 800-160 MG PO TABS: 1.0000 | ORAL_TABLET | Freq: Two times a day (BID) | ORAL | 0 refills | Status: AC

## 2018-04-16 NOTE — Progress Notes (Signed)
Patient: [name] Female    DOB: 06-09-1980   38 y.o.   MRN: [id number] Visit Date: 04/17/2018  Today's Provider: Trey Sailors, PA-C   Chief Complaint  Patient presents with  . Urinary Tract Infection   Subjective:     HPI  Urinary Tract Infection: Patient with history of nephrolithiasis complains of burning with urination and suprapubic pressure She has had symptoms for 1 day. Patient also complains of stomach ache. Patient denies fever. Patient does not have a history of recurrent UTI.  Patient does have a history of pyelonephritis. Reports pelvic cramping. Denies new sexual partners, denies vaginal discharge. Denies flank pain. Some nausea, no vomiting. She still has flomax and phenergan on hand but has not started this. Most recent KUB on 03/06/2018 showed bilateral 5 mm renal calculi.    Allergies  Allergen Reactions  . Compazine Other (See Comments)    Dystonia  . Metoclopramide Other (See Comments)    dystonia     Current Outpatient Medications:  .  baclofen (LIORESAL) 10 MG tablet, Take 10 mg by mouth 2 (two) times daily as needed (migraines). , Disp: , Rfl: 0 .  chlorproMAZINE (THORAZINE) 25 MG tablet, take 25mg  by mouth every 4 to 6 hours as needed for migraines, Disp: , Rfl:  .  Cholecalciferol (VITAMIN D3) 5000 units CAPS, Take 5,000 Units by mouth daily., Disp: , Rfl:  .  gabapentin (NEURONTIN) 400 MG capsule, Take 1 capsule (400 mg total) by mouth 2 (two) times daily., Disp: 180 capsule, Rfl: 1 .  metroNIDAZOLE (METROGEL) 0.75 % vaginal gel, Use one applicator intravaginally after sexual intercourse, Disp: 70 g, Rfl: 3 .  Multiple Vitamin (MULTIVITAMIN WITH MINERALS) TABS tablet, Take 1 tablet by mouth daily., Disp: , Rfl:  .  omeprazole (PRILOSEC) 20 MG capsule, TAKE 1 CAPSULE BY MOUTH ONCE DAILY, Disp: 30 capsule, Rfl: 2 .  OnabotulinumtoxinA (BOTOX IJ), Inject 1 each as directed every 3 (three) months. Alternating with trigger point inj, Disp: ,  Rfl:  .  phentermine (ADIPEX-P) 37.5 MG tablet, Take 1 tablet (37.5 mg total) by mouth daily before breakfast., Disp: 30 tablet, Rfl: 2 .  polyethylene glycol (MIRALAX / GLYCOLAX) packet, Take 17 g by mouth daily., Disp: , Rfl:  .  promethazine (PHENERGAN) 25 MG tablet, Take 1 tablet (25 mg total) by mouth every 8 (eight) hours as needed for nausea or vomiting., Disp: 30 tablet, Rfl: 0 .  valACYclovir (VALTREX) 500 MG tablet, Take 2 tablets (1,000 mg total) by mouth 2 (two) times daily as needed (cold sores)., Disp: 20 tablet, Rfl: 5 .  ALPRAZolam (XANAX) 1 MG tablet, TAKE 1/2 TO 1 TABLET BY MOUTH 3 TIMES A DAY AS NEEDED FOR ANXIETY, Disp: 90 tablet, Rfl: 5 .  ondansetron (ZOFRAN) 4 MG tablet, Take 1 tablet (4 mg total) by mouth every 8 (eight) hours as needed. (Patient not taking: Reported on 04/16/2018), Disp: 20 tablet, Rfl: 0 .  oxyCODONE-acetaminophen (PERCOCET/ROXICET) 5-325 MG tablet, Take 1 tablet by mouth every 6 (six) hours as needed for severe pain., Disp: 30 tablet, Rfl: 0 .  sulfamethoxazole-trimethoprim (BACTRIM DS,SEPTRA DS) 800-160 MG tablet, Take 1 tablet by mouth 2 (two) times daily for 5 days., Disp: 10 tablet, Rfl: 0  Review of Systems  Constitutional: Negative.   Respiratory: Negative.   Gastrointestinal: Positive for abdominal pain.  Genitourinary: Positive for dysuria.    Social History   Tobacco Use  . Smoking status: Former Smoker  Packs/day: 0.50    Years: 15.00    Pack years: 7.50    Types: Cigarettes    Start date: 11/03/1999    Last attempt to quit: 09/26/2014    Years since quitting: 3.5  . Smokeless tobacco: Never Used  Substance Use Topics  . Alcohol use: Yes    Alcohol/week: 0.0 standard drinks    Comment: social-rare      Objective:   BP 106/74 (BP Location: Left Arm, Patient Position: Sitting, Cuff Size: Normal)   Pulse 91   Temp 98 F (36.7 C) (Oral)   Resp 16   Wt 195 lb (88.5 kg)   BMI 30.54 kg/m  Vitals:   04/16/18 1046  BP: 106/74    Pulse: 91  Resp: 16  Temp: 98 F (36.7 C)  TempSrc: Oral  Weight: 195 lb (88.5 kg)     Physical Exam Constitutional:      Comments: Appears to be in pain.   Cardiovascular:     Rate and Rhythm: Normal rate and regular rhythm.     Heart sounds: Normal heart sounds.  Pulmonary:     Effort: Pulmonary effort is normal.     Breath sounds: Normal breath sounds.  Abdominal:     General: Abdomen is flat. Bowel sounds are normal. There is no distension.     Palpations: There is no mass.     Tenderness: There is no abdominal tenderness. There is no right CVA tenderness, left CVA tenderness, guarding or rebound.  Neurological:     Mental Status: She is alert and oriented to person, place, and time. Mental status is at baseline.  Psychiatric:        Mood and Affect: Mood normal.        Behavior: Behavior normal.         Assessment & Plan    1. Dysuria  Some blood on UA dipstick but no leukocytes or nitrites. Will treat empirically for renal stone, KUB as below. Advised to take flomax, phenergan, and PRN pain medication. Will also obtain urine culture. Sent home with bactrim in case she gets fever but I do not overly suspect cystitis at this point.  - POCT urinalysis dipstick - Urine Culture - CULTURE, URINE COMPREHENSIVE - sulfamethoxazole-trimethoprim (BACTRIM DS,SEPTRA DS) 800-160 MG tablet; Take 1 tablet by mouth 2 (two) times daily for 5 days.  Dispense: 10 tablet; Refill: 0 - DG Abd 1 View; Future - oxyCODONE-acetaminophen (PERCOCET/ROXICET) 5-325 MG tablet; Take 1 tablet by mouth every 6 (six) hours as needed for severe pain.  Dispense: 30 tablet; Refill: 0  2. Pelvic pain  - DG Abd 1 View; Future - oxyCODONE-acetaminophen (PERCOCET/ROXICET) 5-325 MG tablet; Take 1 tablet by mouth every 6 (six) hours as needed for severe pain.  Dispense: 30 tablet; Refill: 0  The entirety of the information documented in the History of Present Illness, Review of Systems and Physical Exam  were personally obtained by me. Portions of this information were initially documented by Rondel Baton, CMA and reviewed by me for thoroughness and accuracy.   Return if symptoms worsen or fail to improve.     Trey Sailors, PA-C  [facility]

## 2018-04-16 NOTE — Patient Instructions (Signed)

## 2018-04-17 MED ORDER — ALPRAZOLAM 1 MG PO TABS: ORAL_TABLET | ORAL | 5 refills | Status: DC

## 2018-04-19 LAB — CULTURE, URINE COMPREHENSIVE

## 2018-04-20 ENCOUNTER — Telehealth: Payer: Self-pay

## 2018-04-20 DIAGNOSIS — G43719 Chronic migraine without aura, intractable, without status migrainosus: Secondary | ICD-10-CM | POA: Diagnosis not present

## 2018-04-20 NOTE — Telephone Encounter (Signed)
Patient has viewed result on her mychart.

## 2018-04-20 NOTE — Telephone Encounter (Signed)
-----   Message from Trey Sailors, [place] sent at 04/20/2018  1:07 PM EST ----- Urine culture negative.

## 2018-04-20 NOTE — Telephone Encounter (Signed)
-----   Message from Adriana M Pollak, PA-C sent at 04/20/2018  1:07 PM EST ----- Urine culture negative. 

## 2018-04-20 NOTE — Telephone Encounter (Signed)
Patient has viewed result on mychart. sd 

## 2018-04-23 ENCOUNTER — Other Ambulatory Visit: Payer: Self-pay | Admitting: Physician Assistant

## 2018-04-23 DIAGNOSIS — M791 Myalgia, unspecified site: Secondary | ICD-10-CM | POA: Diagnosis not present

## 2018-04-23 DIAGNOSIS — G43719 Chronic migraine without aura, intractable, without status migrainosus: Secondary | ICD-10-CM | POA: Diagnosis not present

## 2018-04-23 DIAGNOSIS — G518 Other disorders of facial nerve: Secondary | ICD-10-CM | POA: Diagnosis not present

## 2018-04-23 DIAGNOSIS — M542 Cervicalgia: Secondary | ICD-10-CM | POA: Diagnosis not present

## 2018-04-28 ENCOUNTER — Ambulatory Visit
Admission: RE | Admit: 2018-04-28 | Discharge: 2018-04-28 | Disposition: A | Discharge status: Home / Self Care | Payer: BLUE CROSS/BLUE SHIELD | Attending: Physician Assistant | Admitting: Physician Assistant

## 2018-04-28 ENCOUNTER — Ambulatory Visit
Admission: RE | Admit: 2018-04-28 | Discharge: 2018-04-28 | Disposition: A | Discharge status: Home / Self Care | Payer: BLUE CROSS/BLUE SHIELD | Source: Ambulatory Visit | Attending: Physician Assistant | Admitting: Physician Assistant

## 2018-04-28 DIAGNOSIS — N200 Calculus of kidney: Secondary | ICD-10-CM | POA: Insufficient documentation

## 2018-04-28 DIAGNOSIS — R300 Dysuria: Secondary | ICD-10-CM | POA: Insufficient documentation

## 2018-04-28 DIAGNOSIS — R102 Pelvic and perineal pain: Secondary | ICD-10-CM | POA: Diagnosis not present

## 2018-04-29 ENCOUNTER — Telehealth: Payer: Self-pay

## 2018-04-29 NOTE — Telephone Encounter (Signed)
Patient advised as directed below. She reports that she saw the imaging result and since she was passing blood she went ahead an scheduled an appointment with the urologist.she is scheduled to go see Dr. Vanna Hall March 17.

## 2018-04-29 NOTE — Telephone Encounter (Signed)
Sounds great, thank you.

## 2018-04-29 NOTE — Telephone Encounter (Signed)
-----   Message from Trey Sailors, [place] sent at 04/28/2018 11:47 AM EST ----- Moderate stool in right side of large intestine. Visible kidney stones but not any stones appreciated along her ureter.

## 2018-05-12 ENCOUNTER — Ambulatory Visit: Payer: Self-pay | Admitting: Urology

## 2018-05-28 DIAGNOSIS — G518 Other disorders of facial nerve: Secondary | ICD-10-CM | POA: Diagnosis not present

## 2018-05-28 DIAGNOSIS — G43719 Chronic migraine without aura, intractable, without status migrainosus: Secondary | ICD-10-CM | POA: Diagnosis not present

## 2018-05-28 DIAGNOSIS — M791 Myalgia, unspecified site: Secondary | ICD-10-CM | POA: Diagnosis not present

## 2018-05-28 DIAGNOSIS — M542 Cervicalgia: Secondary | ICD-10-CM | POA: Diagnosis not present

## 2018-06-10 ENCOUNTER — Telehealth: Payer: BLUE CROSS/BLUE SHIELD | Admitting: Nurse Practitioner

## 2018-06-10 DIAGNOSIS — N3000 Acute cystitis without hematuria: Secondary | ICD-10-CM | POA: Diagnosis not present

## 2018-06-10 MED ORDER — NITROFURANTOIN MONOHYD MACRO 100 MG PO CAPS: 100.0000 mg | ORAL_CAPSULE | Freq: Two times a day (BID) | ORAL | 0 refills | Status: DC

## 2018-06-10 NOTE — Progress Notes (Signed)

## 2018-06-17 ENCOUNTER — Emergency Department
Admission: EM | Admit: 2018-06-17 | Discharge: 2018-06-17 | Disposition: A | Discharge status: Home / Self Care | Payer: BLUE CROSS/BLUE SHIELD | Attending: Emergency Medicine | Admitting: Emergency Medicine

## 2018-06-17 ENCOUNTER — Emergency Department: Payer: BLUE CROSS/BLUE SHIELD

## 2018-06-17 ENCOUNTER — Telehealth: Payer: Self-pay | Admitting: Urology

## 2018-06-17 ENCOUNTER — Other Ambulatory Visit: Payer: Self-pay

## 2018-06-17 DIAGNOSIS — Z87891 Personal history of nicotine dependence: Secondary | ICD-10-CM | POA: Insufficient documentation

## 2018-06-17 DIAGNOSIS — Z79899 Other long term (current) drug therapy: Secondary | ICD-10-CM | POA: Diagnosis not present

## 2018-06-17 DIAGNOSIS — N200 Calculus of kidney: Secondary | ICD-10-CM | POA: Insufficient documentation

## 2018-06-17 DIAGNOSIS — J45909 Unspecified asthma, uncomplicated: Secondary | ICD-10-CM | POA: Insufficient documentation

## 2018-06-17 DIAGNOSIS — R109 Unspecified abdominal pain: Secondary | ICD-10-CM

## 2018-06-17 DIAGNOSIS — R1032 Left lower quadrant pain: Secondary | ICD-10-CM | POA: Diagnosis not present

## 2018-06-17 LAB — CBC WITH DIFFERENTIAL/PLATELET
Abs Immature Granulocytes: 0.02 10*3/uL (ref 0.00–0.07)
Basophils Absolute: 0.1 10*3/uL (ref 0.0–0.1)
Basophils Relative: 1 %
Eosinophils Absolute: 0.1 10*3/uL (ref 0.0–0.5)
Eosinophils Relative: 2 %
HCT: 42.8 % (ref 36.0–46.0)
Hemoglobin: 14.3 g/dL (ref 12.0–15.0)
Immature Granulocytes: 0 %
Lymphocytes Relative: 38 %
Lymphs Abs: 3.2 10*3/uL (ref 0.7–4.0)
MCH: 31.1 pg (ref 26.0–34.0)
MCHC: 33.4 g/dL (ref 30.0–36.0)
MCV: 93 fL (ref 80.0–100.0)
Monocytes Absolute: 0.5 10*3/uL (ref 0.1–1.0)
Monocytes Relative: 6 %
Neutro Abs: 4.6 10*3/uL (ref 1.7–7.7)
Neutrophils Relative %: 53 %
Platelets: 238 10*3/uL (ref 150–400)
RBC: 4.6 MIL/uL (ref 3.87–5.11)
RDW: 12.3 % (ref 11.5–15.5)
WBC: 8.5 10*3/uL (ref 4.0–10.5)
nRBC: 0 % (ref 0.0–0.2)

## 2018-06-17 LAB — URINALYSIS, COMPLETE (UACMP) WITH MICROSCOPIC
Bacteria, UA: NONE SEEN
Bilirubin Urine: NEGATIVE
Glucose, UA: NEGATIVE mg/dL
Ketones, ur: NEGATIVE mg/dL
Leukocytes,Ua: NEGATIVE
Nitrite: NEGATIVE
Protein, ur: NEGATIVE mg/dL
Specific Gravity, Urine: 1.012 (ref 1.005–1.030)
pH: 6 (ref 5.0–8.0)

## 2018-06-17 LAB — COMPREHENSIVE METABOLIC PANEL
ALT: 14 U/L (ref 0–44)
AST: 16 U/L (ref 15–41)
Albumin: 4.7 g/dL (ref 3.5–5.0)
Alkaline Phosphatase: 78 U/L (ref 38–126)
Anion gap: 11 (ref 5–15)
BUN: 12 mg/dL (ref 6–20)
CO2: 25 mmol/L (ref 22–32)
Calcium: 9.8 mg/dL (ref 8.9–10.3)
Chloride: 102 mmol/L (ref 98–111)
Creatinine, Ser: 0.61 mg/dL (ref 0.44–1.00)
GFR calc Af Amer: >60 mL/min (ref >60)
GFR calc non Af Amer: >60 mL/min (ref >60)
Glucose, Bld: 95 mg/dL (ref 70–99)
Potassium: 4 mmol/L (ref 3.5–5.1)
Sodium: 138 mmol/L (ref 135–145)
Total Bilirubin: 0.4 mg/dL (ref 0.3–1.2)
Total Protein: 8.1 g/dL (ref 6.5–8.1)

## 2018-06-17 LAB — POCT PREGNANCY, URINE: Preg Test, Ur: NEGATIVE

## 2018-06-17 MED ORDER — MORPHINE SULFATE (PF) 4 MG/ML IV SOLN
4.0000 mg | Freq: Once | INTRAVENOUS | Status: AC
  Administered 2018-06-17: 4 mg via INTRAVENOUS
  Filled 2018-06-17: qty 1

## 2018-06-17 MED ORDER — ONDANSETRON HCL 4 MG/2ML IJ SOLN
4.0000 mg | Freq: Once | INTRAMUSCULAR | Status: AC
  Administered 2018-06-17: 4 mg via INTRAVENOUS
  Filled 2018-06-17: qty 2

## 2018-06-17 MED ORDER — OXYCODONE-ACETAMINOPHEN 5-325 MG PO TABS: 1.0000 | ORAL_TABLET | ORAL | 0 refills | Status: DC | PRN

## 2018-06-17 MED ORDER — SODIUM CHLORIDE 0.9 % IV BOLUS
1000.0000 mL | Freq: Once | INTRAVENOUS | Status: AC
  Administered 2018-06-17: 1000 mL via INTRAVENOUS

## 2018-06-17 MED ORDER — FENTANYL CITRATE (PF) 100 MCG/2ML IJ SOLN
50.0000 ug | INTRAMUSCULAR | Status: DC | PRN
  Administered 2018-06-17: 50 ug via INTRAVENOUS
  Filled 2018-06-17: qty 2

## 2018-06-17 MED ORDER — CIPROFLOXACIN HCL 250 MG PO TABS: 250.0000 mg | ORAL_TABLET | Freq: Two times a day (BID) | ORAL | 0 refills | Status: DC

## 2018-06-17 NOTE — Telephone Encounter (Signed)
Attempted to call patient to triage symptoms-unable to reach patient. Noted patient went to ER.

## 2018-06-17 NOTE — Discharge Instructions (Addendum)
Follow-up with your regular doctor if not better in 3 days.  Return emergency department worsening.  Use medication as prescribed.  Drink plenty of water to flush her system.  Add lemon to your water as this will help disintegrate the kidney stones.

## 2018-06-17 NOTE — Telephone Encounter (Signed)
Pt did e-visit w/nurse practioner last Wednesday and they gave her Macrobid.  She is finished with that but is still having symptoms and there is blood in urine.  She has also been taking Flomax.  Please call pt to advise.

## 2018-06-17 NOTE — ED Provider Notes (Signed)
[facility] Emergency Department Provider Note  ____________________________________________   First MD Initiated Contact with Patient 06/17/18 1421     (approximate)  I have reviewed the triage vital signs and the nursing notes.   HISTORY  Chief Complaint Flank Pain    HPI [name] is a 38 y.o. female presents emergency department complaining of left-sided flank pain.  Patient does have a history of kidney stones in which she has had multiple treatments of lithotripsy in the last kidney stone had to be retrieved with "the basket ".  She denies any nausea or vomiting.  She states that she had a UTI last week in which she is finished 5 days of Macrobid.  She denies fever chills at this time.    Past Medical History:  Diagnosis Date  . Acute pyelonephritis 06/29/2012  . Anxiety   . Asthma    AS A CHILD  . Complication of anesthesia    TROUBLE URINATING IN THE PAST  . Gallstones   . GERD (gastroesophageal reflux disease)   . History of kidney stones   . Kidney stones    bilateral  . Migraine   . Pelvic pain in female   . PONV (postoperative nausea and vomiting)    WITH HYSTERECTOMY ONLY  . Pyelonephritis 2015  . Renal mass    right  . Tobacco abuse     Patient Active Problem List   Diagnosis Date Noted  . Endometriosis 08/27/2017  . Pelvic pain 08/27/2017  . IBS (irritable bowel syndrome) 02/02/2015  . Herpes simplex 02/02/2015  . Asthma, mild intermittent 11/03/2014  . Migraine without aura or status migrainosus 11/03/2014  . Gastroesophageal reflux disease without esophagitis 11/03/2014  . Generalized anxiety disorder 11/03/2014  . Cyst of ovary 08/06/2013  . Appendicolith 07/16/2013  . Chronic constipation 06/30/2012  . History of pyelonephritis 06/29/2012  . Nephrolithiasis 06/28/2012    Past Surgical History:  Procedure Laterality Date  . ABDOMINAL HYSTERECTOMY  2006  . ANKLE ARTHROSCOPY Left   . ANKLE ARTHROSCOPY  Left 11/24/2015  . APPENDECTOMY  07/26/13  . CAST APPLICATION  12/25/2011   Procedure: CAST APPLICATION;  Surgeon: Javier Docker, MD;  Location: [facility];  Service: Orthopedics;  Laterality: Left;  . CHOLECYSTECTOMY  03/11/2011   Procedure: LAPAROSCOPIC CHOLECYSTECTOMY WITH INTRAOPERATIVE CHOLANGIOGRAM;  Surgeon: Atilano Ina, MD;  Location: [facility] OR;  Service: General;  Laterality: N/A;  . CHOLECYSTECTOMY, LAPAROSCOPIC    . CYSTOSCOPY/URETEROSCOPY/HOLMIUM LASER/STENT PLACEMENT Left 06/18/2016   Procedure: CYSTOSCOPY/URETEROSCOPY/HOLMIUM LASER/STENT PLACEMENT;  Surgeon: Vanna Scotland, MD;  Location: [facility] ORS;  Service: Urology;  Laterality: Left;  . LAPAROSCOPIC OVARIAN CYSTECTOMY Right 09/02/2017   Procedure: LAPAROSCOPIC OVARIAN CYSTECTOMY;  Surgeon: Nadara Mustard, MD;  Location: [facility] OR[facility];  Service: Gynecology;  Laterality: Right;  . LITHOTRIPSY     x 4  . ORIF ANKLE FRACTURE  12/25/2011   Procedure: OPEN REDUCTION INTERNAL FIXATION (ORIF) ANKLE FRACTURE;  Surgeon: Javier Docker, MD;  Location: [facility];  Service: Orthopedics;  Laterality: Left;  . TONSILLECTOMY      Prior to Admission medications   Medication Sig Start Date End Date Taking? Authorizing Provider  ALPRAZolam Prudy Feeler) 1 MG tablet TAKE 1/2 TO 1 TABLET BY MOUTH 3 TIMES A DAY AS NEEDED FOR ANXIETY 04/17/18   Joycelyn Man M, PA-C  baclofen (LIORESAL) 10 MG tablet Take 10 mg by mouth 2 (two) times daily as needed (migraines).  05/15/16   [provider]  chlorproMAZINE (THORAZINE) 25  MG tablet take  by mouth every 4 to 6 hours as needed for migraines    [provider]  Cholecalciferol (VITAMIN D3) 5000 units CAPS Take 5,000 Units by mouth daily.    [provider]  ciprofloxacin (CIPRO) 250 MG tablet Take 1 tablet (250 mg total) by mouth 2 (two) times daily. 06/17/18   Fisher, Roselyn Bering, PA-C  gabapentin (NEURONTIN) 400 MG capsule Take 1 capsule (400 mg total) by mouth 2 (two) times daily. 11/24/17    [name], PA-C  metroNIDAZOLE (METROGEL) 0.75 % vaginal gel Use one applicator intravaginally after sexual intercourse 03/11/18   [name], PA-C  Multiple Vitamin (MULTIVITAMIN WITH MINERALS) TABS tablet Take 1 tablet by mouth daily.    [provider]  nitrofurantoin, macrocrystal-monohydrate, (MACROBID) 100 MG capsule Take 1 capsule (100 mg total) by mouth 2 (two) times daily. 1 po BId 06/10/18   Bennie Pierini, FNP  omeprazole (PRILOSEC) 20 MG capsule TAKE 1 CAPSULE BY MOUTH ONCE DAILY 04/23/18   [name], PA-C  OnabotulinumtoxinA (BOTOX IJ) Inject 1 each as directed every 3 (three) months. Alternating with trigger point inj    [provider]  ondansetron (ZOFRAN) 4 MG tablet Take 1 tablet (4 mg total) by mouth every 8 (eight) hours as needed. Patient not taking: Reported on 04/16/2018 11/21/17   [name], PA-C  oxyCODONE-acetaminophen (PERCOCET/ROXICET) 5-325 MG tablet Take 1 tablet by mouth every 4 (four) hours as needed for severe pain. 06/17/18   Sherrie Mustache Roselyn Bering, PA-C  phentermine (ADIPEX-P) 37.5 MG tablet Take 1 tablet (37.5 mg total) by mouth daily before breakfast. 03/11/18   Burnette, Alessandra Bevels, PA-C  polyethylene glycol (MIRALAX / GLYCOLAX) packet Take 17 g by mouth daily.    [provider]  promethazine (PHENERGAN) 25 MG tablet Take 1 tablet (25 mg total) by mouth every 8 (eight) hours as needed for nausea or vomiting. 10/07/17   [name], PA-C  valACYclovir (VALTREX) 500 MG tablet Take 2 tablets (1,000 mg total) by mouth 2 (two) times daily as needed (cold sores). 03/11/18   [name], PA-C    Allergies Compazine and Metoclopramide  Family History  Problem Relation Age of Onset  . Diabetes Mother   . Diabetes Father   . Heart failure Father   . Hypertension Father   . Pulmonary fibrosis Father   . Nephrolithiasis Father   . Kidney cancer Neg Hx   . Bladder Cancer Neg Hx      Social History Social History   Tobacco Use  . Smoking status: Former Smoker    Packs/day: 0.50    Years: 15.00    Pack years: 7.50    Types: Cigarettes    Start date: 11/03/1999    Last attempt to quit: 09/26/2014    Years since quitting: 3.7  . Smokeless tobacco: Never Used  Substance Use Topics  . Alcohol use: Yes    Alcohol/week: 0.0 standard drinks    Comment: social-rare  . Drug use: No    Review of Systems  Constitutional: No fever/chills Eyes: No visual changes. ENT: No sore throat. Respiratory: Denies cough Gastrointestinal: Positive for left-sided flank pain Genitourinary: Negative for dysuria. Musculoskeletal: Negative for back pain. Skin: Negative for rash.    ____________________________________________   PHYSICAL EXAM:  VITAL SIGNS: ED Triage Vitals  Enc Vitals Group     BP 06/17/18 1313 119/79     Pulse Rate 06/17/18 1313 (!) 108  Resp 06/17/18 1313 16     Temp 06/17/18 1313 97.8 F (36.6 C)     Temp Source 06/17/18 1313 Oral     SpO2 06/17/18 1313 100 %     Weight 06/17/18 1315 195 lb (88.5 kg)     Height 06/17/18 1315 5\' 7"  (1.702 m)     Head Circumference --      Peak Flow --      Pain Score 06/17/18 1315 8     Pain Loc --      Pain Edu? --      Excl. in GC? --     Constitutional: Alert and oriented. Well appearing and in no acute distress. Eyes: Conjunctivae are normal.  Head: Atraumatic. Nose: No congestion/rhinnorhea. Mouth/Throat: Mucous membranes are moist.   Neck:  supple no lymphadenopathy noted Cardiovascular: Normal rate, regular rhythm. Heart sounds are normal Respiratory: Normal respiratory effort.  No retractions, lungs c t a  Abd: soft nontender bs normal all 4 quad, positive CVA tenderness on the left side GU: deferred Musculoskeletal: FROM all extremities, warm and well perfused Neurologic:  Normal speech and language.  Skin:  Skin is warm, dry and intact. No rash noted. Psychiatric: Mood and affect are normal.  Speech and behavior are normal.  ____________________________________________   LABS (all labs ordered are listed, but only abnormal results are displayed)  Labs Reviewed  URINALYSIS, COMPLETE (UACMP) WITH MICROSCOPIC - Abnormal; Notable for the following components:      Result Value   Color, Urine YELLOW (*)    APPearance HAZY (*)    Hgb urine dipstick MODERATE (*)    All other components within normal limits  COMPREHENSIVE METABOLIC PANEL  CBC WITH DIFFERENTIAL/PLATELET  POC URINE PREG, ED  POCT PREGNANCY, URINE   ____________________________________________   ____________________________________________  RADIOLOGY  CT abdomen/pelvis for renal stone is negative for any obstructing stones  ____________________________________________   PROCEDURES  Procedure(s) performed: Saline lock, morphine 4 mg IV, Zofran 4 mg IV, 1 L normal saline, repeat of Zofran 4 mg IV and fentanyl 50 mcg.  No  Procedures    ____________________________________________   INITIAL IMPRESSION / ASSESSMENT AND PLAN / ED COURSE  Pertinent labs & imaging results that were available during my care of the patient were reviewed by me and considered in my medical decision making (see chart for details).   Patient is a 38 year old female presents emergency department complaining of left-sided flank pain.  Patient has a history of kidney stones.  Physical exam shows patient to be quite uncomfortable.  Positive CVA tenderness on the left side.  Remainder the exam is unremarkable  Ddx:  Kidney stone, pyelonephritis, abdominal pain,  POC pregnancy is negative, urinalysis shows moderate hemoglobin and RBCs, CBC is normal, comprehensive metabolic panel is normal  CT renal stone study is negative for any obstructing renal stones, no renal stones were noted in the ureters or bladder at this time.  Explained findings to the patient.  She states she is feeling much better after the fluids and pain  medication.  Due to the recent infection along with some elevated WBCs in the urine I recommended that we treat her with an antibiotic and she was given pain medication for recurrent stone disease.  She was given a prescription for Cipro Percocet.  She is to follow-up with her regular doctor if not better in 3 days.  Return emergency department worsening.  She states she understands will comply.  Patient was discharged in stable condition.  As part of my medical decision making, I reviewed the following data within the electronic MEDICAL RECORD NUMBER Nursing notes reviewed and incorporated, Labs reviewed see above, Old chart reviewed, Radiograph reviewed CT renal stone study is negative for any acute abnormality, Evaluated by EM attending Dr. Mayford Knife, Notes from prior ED visits and [place] Controlled Substance Database  ____________________________________________   FINAL CLINICAL IMPRESSION(S) / ED DIAGNOSES  Final diagnoses:  Flank pain  Kidney stone      NEW MEDICATIONS STARTED DURING THIS VISIT:  Discharge Medication List as of 06/17/2018  3:32 PM    START taking these medications   Details  ciprofloxacin (CIPRO) 250 MG tablet Take 1 tablet (250 mg total) by mouth 2 (two) times daily., Starting Wed 06/17/2018, Normal         Note:  This document was prepared using Dragon voice recognition software and may include unintentional dictation errors.    Faythe Ghee, PA-C 06/17/18 1656    Emily Filbert, MD 06/18/18 [phone]

## 2018-06-17 NOTE — ED Triage Notes (Signed)
Left sided flank pain, hx of kidney stones. Feels similar. Pt recently finished macrobid for UTI sx.  Pt has lithotripsy often.

## 2018-06-23 ENCOUNTER — Other Ambulatory Visit: Payer: Self-pay | Admitting: Physician Assistant

## 2018-06-23 DIAGNOSIS — G2581 Restless legs syndrome: Secondary | ICD-10-CM

## 2018-06-28 ENCOUNTER — Other Ambulatory Visit: Payer: Self-pay | Admitting: Physician Assistant

## 2018-06-28 DIAGNOSIS — R110 Nausea: Secondary | ICD-10-CM

## 2018-06-29 MED ORDER — PROMETHAZINE HCL 25 MG PO TABS: 25.0000 mg | ORAL_TABLET | Freq: Three times a day (TID) | ORAL | 0 refills | Status: DC | PRN

## 2018-07-02 DIAGNOSIS — M545 Low back pain: Secondary | ICD-10-CM | POA: Diagnosis not present

## 2018-07-03 IMAGING — US US RENAL
1 series · 14 of 25 positions shown · non-contrast
Comparison: CT 06/07/2016

CLINICAL DATA: LEFT flank pain

EXAM:
RENAL / URINARY TRACT ULTRASOUND COMPLETE

[Series 1: us renal · 0.22mm/px · 14 of 52 slices shown]
[im 1/52]
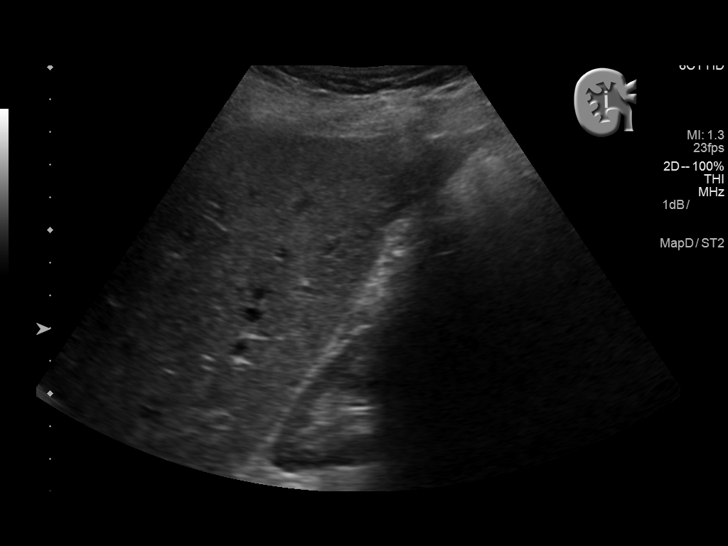
[im 5/52]
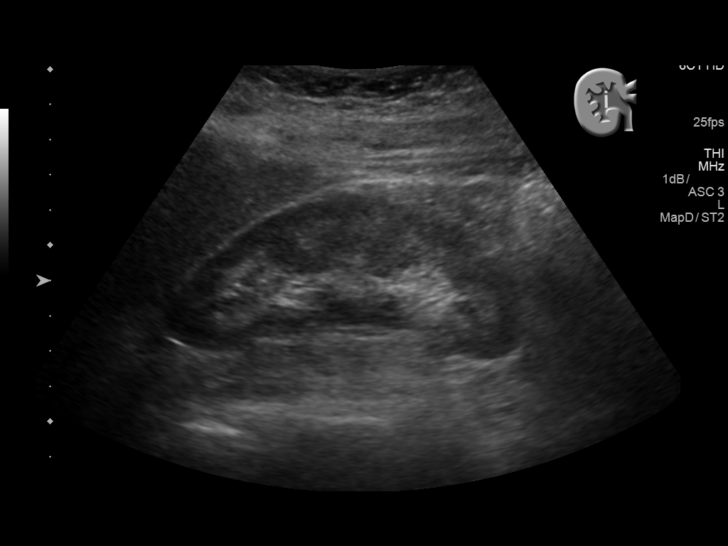
[im 9/52]
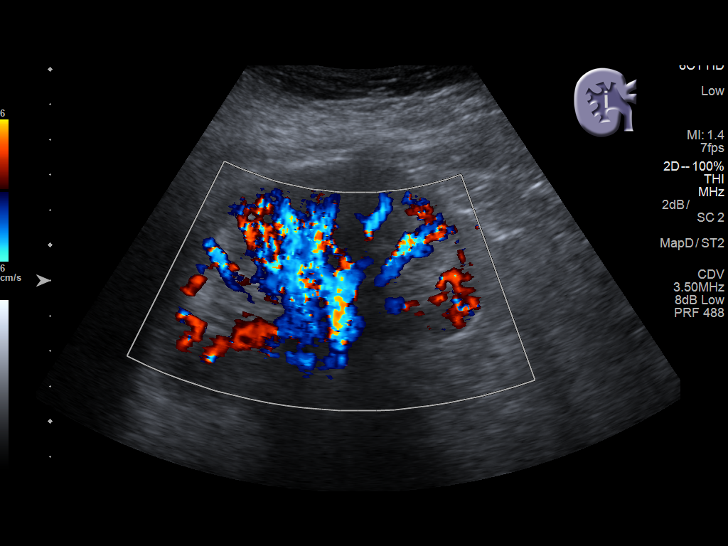
[im 13/52]
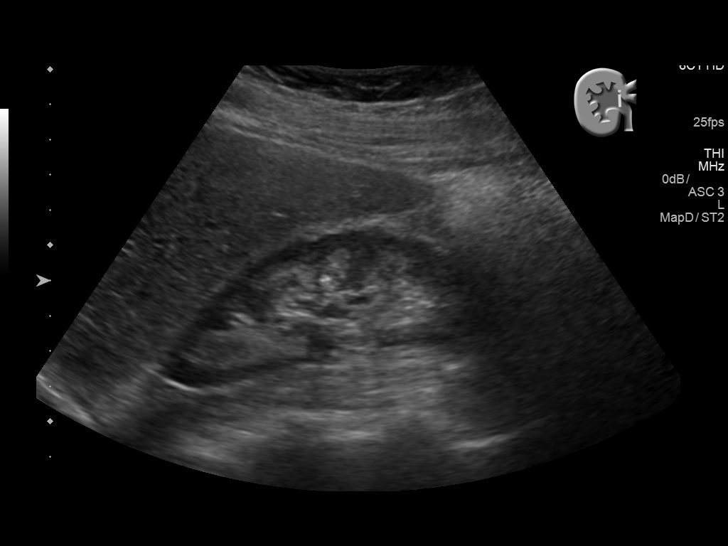
[im 18/52]
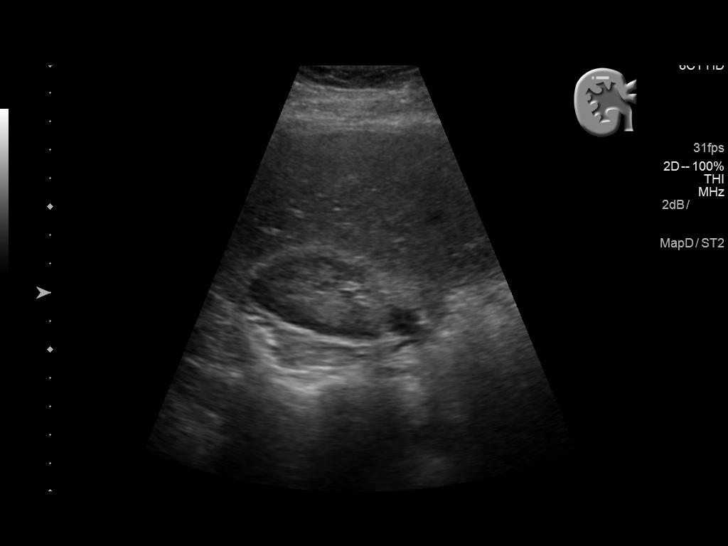
[im 20/52]
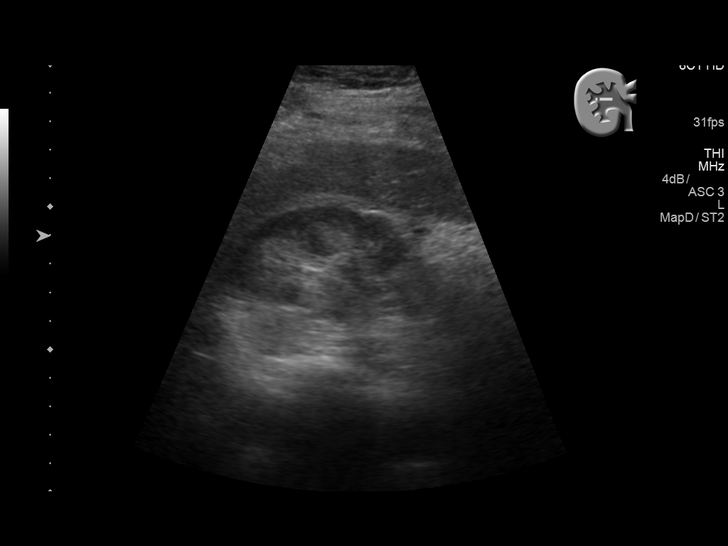
[im 24/52]
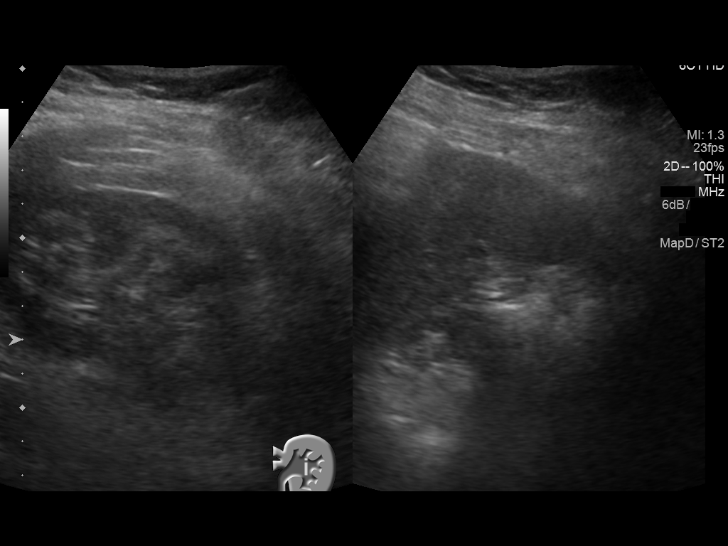
[im 28/52]
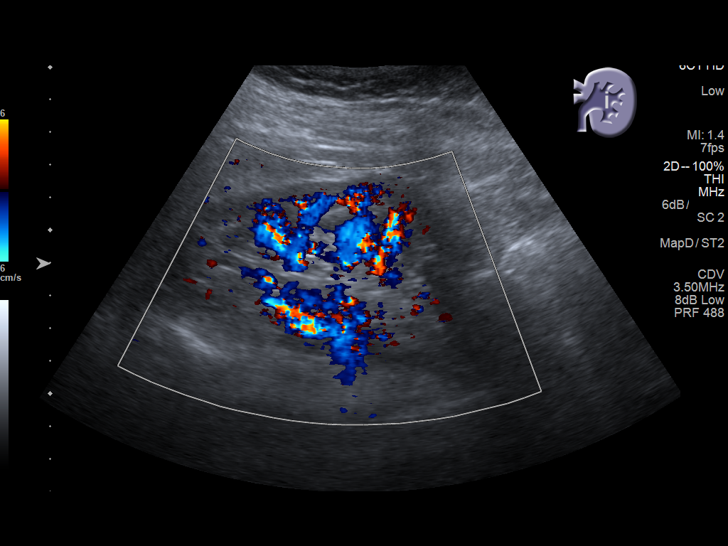
[im 32/52]
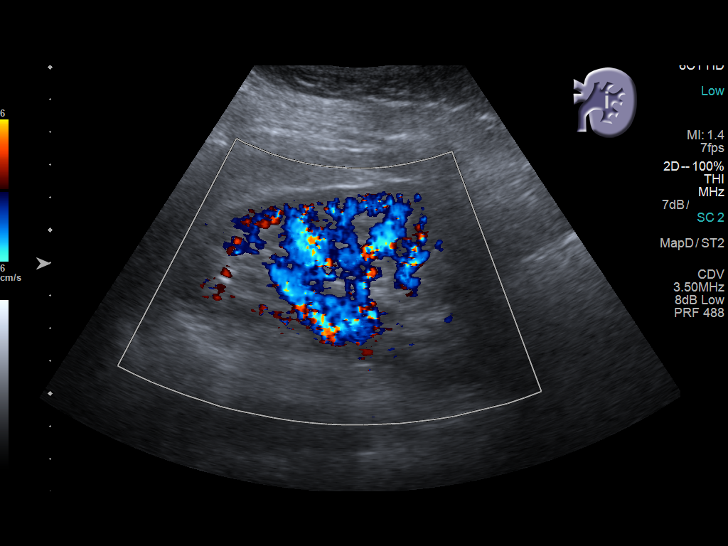
[im 35/52]
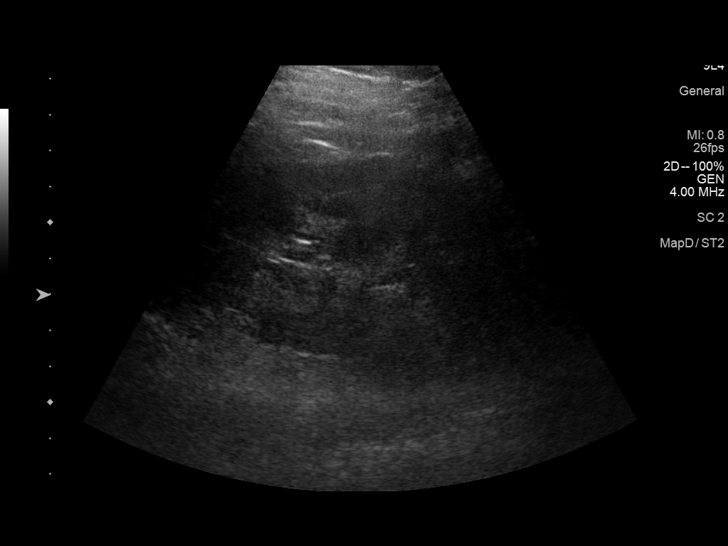
[im 39/52]
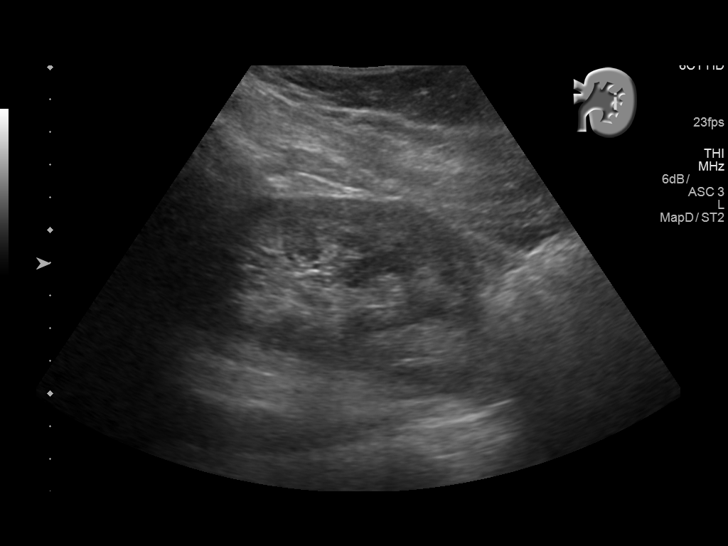
[im 43/52]
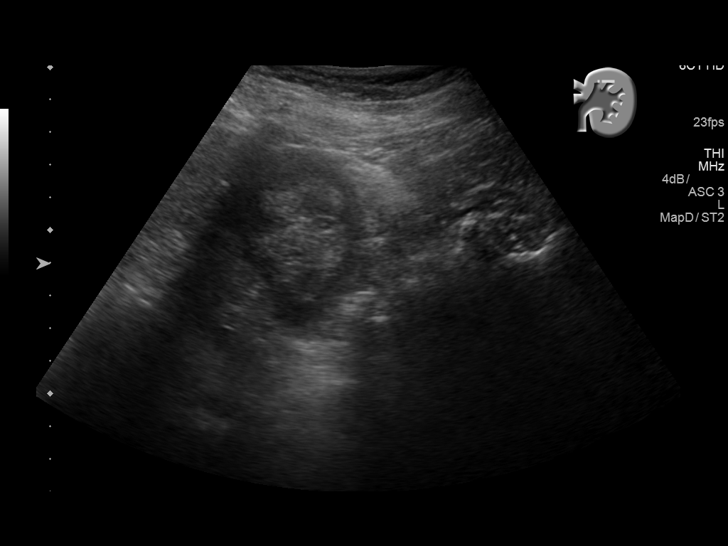
[im 47/52]
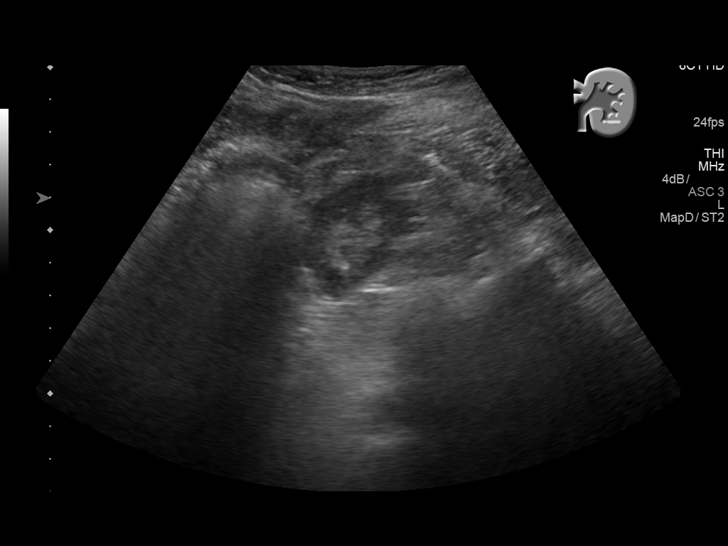
[im 52/52]
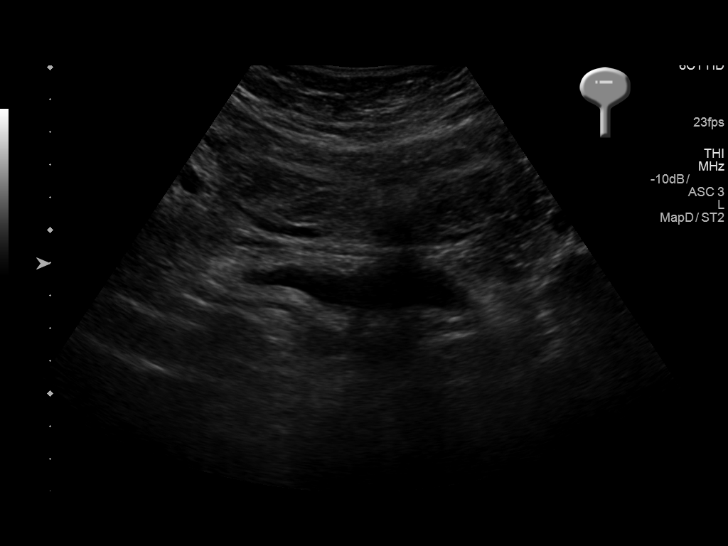

[14 of 25 positions shown; findings below may reference images not displayed]

FINDINGS: Right Kidney:

Length: 10.4 cm. Multiple small echogenic stones. No hydronephrosis

Left Kidney:

Length: 11.5 cm. Single central echogenic calculus. No
hydronephrosis.

Bladder:

Minimally distended. No abnormality identified. Ureteral jets not
evaluated.
IMPRESSION: 1. Bilateral renal calculi.
2. No hydronephrosis.

## 2018-07-06 ENCOUNTER — Other Ambulatory Visit: Payer: Self-pay | Admitting: Physician Assistant

## 2018-07-06 DIAGNOSIS — K219 Gastro-esophageal reflux disease without esophagitis: Secondary | ICD-10-CM

## 2018-07-16 DIAGNOSIS — G43719 Chronic migraine without aura, intractable, without status migrainosus: Secondary | ICD-10-CM | POA: Diagnosis not present

## 2018-07-21 DIAGNOSIS — M5136 Other intervertebral disc degeneration, lumbar region: Secondary | ICD-10-CM | POA: Diagnosis not present

## 2018-07-23 DIAGNOSIS — G43719 Chronic migraine without aura, intractable, without status migrainosus: Secondary | ICD-10-CM | POA: Diagnosis not present

## 2018-07-23 DIAGNOSIS — M542 Cervicalgia: Secondary | ICD-10-CM | POA: Diagnosis not present

## 2018-07-24 DIAGNOSIS — M47896 Other spondylosis, lumbar region: Secondary | ICD-10-CM | POA: Diagnosis not present

## 2018-07-27 ENCOUNTER — Other Ambulatory Visit: Payer: Self-pay

## 2018-07-27 ENCOUNTER — Encounter: Payer: Self-pay | Admitting: Urology

## 2018-07-27 DIAGNOSIS — N200 Calculus of kidney: Secondary | ICD-10-CM

## 2018-08-04 ENCOUNTER — Encounter: Payer: Self-pay | Admitting: Physician Assistant

## 2018-08-04 DIAGNOSIS — Z803 Family history of malignant neoplasm of breast: Secondary | ICD-10-CM

## 2018-08-04 DIAGNOSIS — Z1239 Encounter for other screening for malignant neoplasm of breast: Secondary | ICD-10-CM

## 2018-08-06 DIAGNOSIS — M47896 Other spondylosis, lumbar region: Secondary | ICD-10-CM | POA: Diagnosis not present

## 2018-08-10 ENCOUNTER — Ambulatory Visit
Admission: RE | Admit: 2018-08-10 | Discharge: 2018-08-10 | Disposition: A | Discharge status: Home / Self Care | Payer: BC Managed Care – PPO | Source: Ambulatory Visit | Attending: Urology | Admitting: Urology

## 2018-08-10 ENCOUNTER — Other Ambulatory Visit: Payer: Self-pay

## 2018-08-10 DIAGNOSIS — N200 Calculus of kidney: Secondary | ICD-10-CM | POA: Diagnosis not present

## 2018-08-11 ENCOUNTER — Ambulatory Visit: Payer: BC Managed Care – PPO | Admitting: Urology

## 2018-08-11 ENCOUNTER — Encounter: Payer: Self-pay | Admitting: Urology

## 2018-08-11 VITALS — BP 116/74 | HR 68 | Ht 67.0 in | Wt 194.0 lb

## 2018-08-11 DIAGNOSIS — N200 Calculus of kidney: Secondary | ICD-10-CM | POA: Diagnosis not present

## 2018-08-11 DIAGNOSIS — R350 Frequency of micturition: Secondary | ICD-10-CM | POA: Diagnosis not present

## 2018-08-11 NOTE — Progress Notes (Signed)
08/11/2018 2:10 PM   [name] Oct 19, 1980 [id number]  Referring provider: Mar Daring, PA-C [address] [place],  [place] [address]  Chief Complaint  Patient presents with  . Nephrolithiasis    HPI: 38 yo F with a personal history of nephrolithiasis returns today for routine annual follow-up.  Notably, she was seen 4/20 in the emergency room with severe left flank pain.  She had a CT scan which was negative for obstructing stones.  She had been treated via E visit by her primary care with Macrobid the previous week with persistent symptoms.  In the emergency room, she did have microscopic blood in her urine.  In the emergency room, she reports that her pain was really quite easy to control.  After discharge, she had no further flank pain.  She is quite certain she passed the stone.  Today, she is asymptomatic.  This is her only stone episode this year.  Stone analysis shows 5% calcium oxalate dihydrate, 45% calcium oxalate monohydrate, and 50% calcium phosphate.  She has had ESWL x 4 and URS x 1.She reports that she generally has stone episodes every other year around this time of year.  She was on potassium citrate in the remote past but this upset her stomach.  She is had a 93-TTSV urine metabolic work-up approximately 5 years ago.  She does also note today that she has daytime urinary frequency.  She voids approximately every hour at work but does admit to drinking a very large amount of water throughout the day.  She gets up about 1 time at night to void.  She is always worried or anxious about waking up at night and urinating.  She also had an isolated episode of enuresis.  No urge incontinence.  Minimal stress incontinence with extreme laughing and sneezing.   PMH: Past Medical History:  Diagnosis Date  . Acute pyelonephritis 06/29/2012  . Anxiety   . Asthma    AS A CHILD  . Complication of anesthesia    TROUBLE URINATING IN THE PAST  .  Gallstones   . GERD (gastroesophageal reflux disease)   . History of kidney stones   . Kidney stones    bilateral  . Migraine   . Pelvic pain in female   . PONV (postoperative nausea and vomiting)    WITH HYSTERECTOMY ONLY  . Pyelonephritis 2015  . Renal mass    right  . Tobacco abuse     Surgical History: Past Surgical History:  Procedure Laterality Date  . ABDOMINAL HYSTERECTOMY  2006  . ANKLE ARTHROSCOPY Left   . ANKLE ARTHROSCOPY Left 11/24/2015  . APPENDECTOMY  07/26/13  . CAST APPLICATION  77/93/9030   Procedure: CAST APPLICATION;  Surgeon: Johnn Hai, MD;  Location: [facility];  Service: Orthopedics;  Laterality: Left;  . CHOLECYSTECTOMY  03/11/2011   Procedure: LAPAROSCOPIC CHOLECYSTECTOMY WITH INTRAOPERATIVE CHOLANGIOGRAM;  Surgeon: Gayland Curry, MD;  Location: [facility];  Service: General;  Laterality: N/A;  . CHOLECYSTECTOMY, LAPAROSCOPIC    . CYSTOSCOPY/URETEROSCOPY/HOLMIUM LASER/STENT PLACEMENT Left 06/18/2016   Procedure: CYSTOSCOPY/URETEROSCOPY/HOLMIUM LASER/STENT PLACEMENT;  Surgeon: Hollice Espy, MD;  Location: [facility];  Service: Urology;  Laterality: Left;  . LAPAROSCOPIC OVARIAN CYSTECTOMY Right 09/02/2017   Procedure: LAPAROSCOPIC OVARIAN CYSTECTOMY;  Surgeon: Gae Dry, MD;  Location: [facility];  Service: Gynecology;  Laterality: Right;  . LITHOTRIPSY     x 4  . ORIF ANKLE FRACTURE  12/25/2011   Procedure: OPEN REDUCTION INTERNAL FIXATION (  ORIF) ANKLE FRACTURE;  Surgeon: Javier DockerJeffrey C Beane, MD;  Location: [facility];  Service: Orthopedics;  Laterality: Left;  . TONSILLECTOMY      Home Medications:  Allergies as of 08/11/2018      Reactions   Compazine Other (See Comments)   Dystonia   Metoclopramide Other (See Comments)   dystonia      Medication List       Accurate as of August 11, 2018  2:10 PM. If you have any questions, ask your nurse or doctor.        STOP taking these medications   ciprofloxacin 250 MG tablet Commonly known as: CIPRO Stopped  by: Vanna ScotlandAshley Essie Gehret, MD   nitrofurantoin (macrocrystal-monohydrate) 100 MG capsule Commonly known as: Macrobid Stopped by: Vanna ScotlandAshley Selicia Windom, MD   ondansetron 4 MG tablet Commonly known as: ZOFRAN Stopped by: Vanna ScotlandAshley Bracen Schum, MD   oxyCODONE-acetaminophen 5-325 MG tablet Commonly known as: PERCOCET/ROXICET Stopped by: Vanna ScotlandAshley Coletta Lockner, MD     TAKE these medications   ALPRAZolam 1 MG tablet Commonly known as: XANAX TAKE 1/2 TO 1 TABLET BY MOUTH 3 TIMES A DAY AS NEEDED FOR ANXIETY   baclofen 10 MG tablet Commonly known as: LIORESAL Take 10 mg by mouth 2 (two) times daily as needed (migraines).   BOTOX IJ Inject 1 each as directed every 3 (three) months. Alternating with trigger point inj   chlorproMAZINE 25 MG tablet Commonly known as: THORAZINE take 25mg  by mouth every 4 to 6 hours as needed for migraines   gabapentin 400 MG capsule Commonly known as: NEURONTIN TAKE 1 CAPSULE BY MOUTH 2 TIMES DAILY   metroNIDAZOLE 0.75 % vaginal gel Commonly known as: METROGEL Use one applicator intravaginally after sexual intercourse   multivitamin with minerals Tabs tablet Take 1 tablet by mouth daily.   omeprazole 20 MG capsule Commonly known as: PRILOSEC TAKE 1 CAPSULE BY MOUTH ONCE DAILY   phentermine 37.5 MG tablet Commonly known as: ADIPEX-P Take 1 tablet (37.5 mg total) by mouth daily before breakfast.   polyethylene glycol 17 g packet Commonly known as: MIRALAX / GLYCOLAX Take 17 g by mouth daily.   promethazine 25 MG tablet Commonly known as: PHENERGAN Take 1 tablet (25 mg total) by mouth every 8 (eight) hours as needed for nausea or vomiting.   valACYclovir 500 MG tablet Commonly known as: VALTREX Take 2 tablets (1,000 mg total) by mouth 2 (two) times daily as needed (cold sores).   Vitamin D3 125 MCG (5000 UT) Caps Take 5,000 Units by mouth daily.       Allergies:  Allergies  Allergen Reactions  . Compazine Other (See Comments)    Dystonia  . Metoclopramide  Other (See Comments)    dystonia    Family History: Family History  Problem Relation Age of Onset  . Diabetes Mother   . Diabetes Father   . Heart failure Father   . Hypertension Father   . Pulmonary fibrosis Father   . Nephrolithiasis Father   . Kidney cancer Neg Hx   . Bladder Cancer Neg Hx     Social History:  reports that she quit smoking about 3 years ago. Her smoking use included cigarettes. She started smoking about 18 years ago. She has a 7.50 pack-year smoking history. She has never used smokeless tobacco. She reports current alcohol use. She reports that she does not use drugs.  ROS: UROLOGY Frequent Urination?: Yes Hard to postpone urination?: No Burning/pain with urination?: No Get up at night to urinate?: No  Leakage of urine?: No Urine stream starts and stops?: No Trouble starting stream?: No Do you have to strain to urinate?: No Blood in urine?: No Urinary tract infection?: No Sexually transmitted disease?: No Injury to kidneys or bladder?: No Painful intercourse?: No Weak stream?: No Currently pregnant?: No Vaginal bleeding?: No Last menstrual period?: n  Gastrointestinal Nausea?: No Vomiting?: No Indigestion/heartburn?: No Diarrhea?: No Constipation?: No  Constitutional Fever: No Night sweats?: No Weight loss?: No Fatigue?: No  Skin Skin rash/lesions?: No Itching?: No  Eyes Blurred vision?: No Double vision?: No  Ears/Nose/Throat Sore throat?: No Sinus problems?: No  Hematologic/Lymphatic Swollen glands?: No Easy bruising?: No  Cardiovascular Leg swelling?: No Chest pain?: No  Respiratory Cough?: No Shortness of breath?: No  Endocrine Excessive thirst?: No  Musculoskeletal Back pain?: No Joint pain?: No  Neurological Headaches?: No Dizziness?: No  Psychologic Depression?: No Anxiety?: No  Physical Exam: BP 116/74   Pulse 68   Ht 5\' 7"  (1.702 m)   Wt 194 lb (88 kg)   BMI 30.38 kg/m   Constitutional:   Alert and oriented, No acute distress. HEENT: [place] AT, moist mucus membranes.  Trachea midline, no masses. Cardiovascular: No clubbing, cyanosis, or edema. Respiratory: Normal respiratory effort, no increased work of breathing. Skin: No rashes, bruises or suspicious lesions. Neurologic: Grossly intact, no focal deficits, moving all 4 extremities. Psychiatric: Normal mood and affect.  Laboratory Data: Lab Results  Component Value Date   WBC 8.5 06/17/2018   HGB 14.3 06/17/2018   HCT 42.8 06/17/2018   MCV 93.0 06/17/2018   PLT 238 06/17/2018    Lab Results  Component Value Date   CREATININE 0.61 06/17/2018     Lab Results  Component Value Date   HGBA1C 4.9 11/20/2017    Urinalysis Urinalysis from the emergency room in 05/2018 was reviewed, consistent with microscopic blood  Pertinent Imaging: Results for orders placed during the hospital encounter of 08/10/18  Abdomen 1 view (KUB)   Narrative CLINICAL DATA:  History of bilateral kidney stones. History of lithotripsies in stent placement.  EXAM: ABDOMEN - 1 VIEW  COMPARISON:  CT abdomen dated 06/17/2018. Plain film of the abdomen dated 04/28/2018.  FINDINGS: Small bilateral renal stones appears stable. No evidence ureteral or bladder calculi identified.  Bowel gas pattern is nonobstructive. Small benign phleboliths noted in the lower pelvis. Cholecystectomy clips in the RIGHT upper quadrant. Osseous structures are unremarkable.  IMPRESSION: Stable bilateral nephrolithiasis. No ureteral or bladder calculi identified.   Electronically Signed   By: Bary RichardStan  Maynard M.D.   On: 08/10/2018 14:43     Results for orders placed during the hospital encounter of 07/30/16  US Renal   Narrative CLINICAL DATA:  LEFT ureteral stone, history of renal stones post lithotripsy and ureteroscopy 4 weeks ago with stent placement  EXAM: RENAL / URINARY TRACT ULTRASOUND COMPLETE  COMPARISON:  06/10/2016  FINDINGS: Right  Kidney:  Length: 11.3 cm. Cortical thinning. Normal cortical echogenicity. No mass, hydronephrosis, or shadowing calcification  Left Kidney:  Length: 11.5 cm. Mild cortical thinning with upper normal cortical echogenicity. No definite mass, hydronephrosis or shadowing calcification. No definite ureteral stent identified.  Bladder:  Appears normal for degree of bladder distention. BILATERAL ureteral jets visualized.  IMPRESSION: Renal cortical atrophy bilaterally without evidence of hydronephrosis.   Electronically Signed   By: Ulyses SouthwardMark  Boles M.D.   On: 07/30/2016 17:10     Results for orders placed during the hospital encounter of 06/17/18  CT Renal Stone Study  Narrative CLINICAL DATA:  Left-sided flank pain  EXAM: CT ABDOMEN AND PELVIS WITHOUT CONTRAST  TECHNIQUE: Multidetector CT imaging of the abdomen and pelvis was performed following the standard protocol without oral or IV contrast.  COMPARISON:  October 07, 2017  FINDINGS: Lower chest: Lung bases are clear.  Hepatobiliary: No focal liver lesions are identified beyond a small calcified granuloma in the left lobe of the liver, stable. Gallbladder is absent. There is no appreciable biliary duct dilatation given post cholecystectomy state.  Pancreas: No pancreatic mass or inflammatory focus evident.  Spleen: No splenic lesions are evident.  Adrenals/Urinary Tract: Adrenals bilaterally appear normal. Kidneys bilaterally show no evident mass or hydronephrosis on either side. There is a calculus in the mid to lower pole region on the right measuring 5 x 3 mm. A nearby 1 mm calculus is identified in this area. More superiorly, there is a 2 mm calculus in the mid right kidney. On the left, there is a 1 mm calculus in the upper pole region. There is a 3 mm calculus in the mid kidney on the left. More inferiorly, there is a 4 x 3 mm calculus with a nearby 1 mm calculus. No ureteral calculi are evident on either  side. Small phleboliths are near but felt to be separate from ureters on each side. Urinary bladder is midline with wall thickness within normal limits.  Stomach/Bowel: There is no appreciable bowel wall or mesenteric thickening. There is no appreciable bowel obstruction. There is no evident free air or portal venous air.  Vascular/Lymphatic: There is no abdominal aortic aneurysm. No vascular lesions are evident on this noncontrast enhanced study. There is no adenopathy in the abdomen or pelvis.  Reproductive: The uterus is absent. There is no evident pelvic mass.  Other: The appendix is absent. There is no periappendiceal region inflammation. There is no abscess or ascites in the abdomen or pelvis.  Musculoskeletal: There are no blastic or lytic bone lesions. There is no intramuscular or abdominal wall lesion.  IMPRESSION: 1. Nonobstructing calculi in each kidney. No hydronephrosis or ureteral calculus evident on either side. Urinary bladder thickness is within normal limits.  2. No evident bowel obstruction. No abscess in the abdomen or pelvis. Appendix absent.  3.  Gallbladder absent.  Uterus absent.   Electronically Signed   By: Bretta Bang III M.D.   On: 06/17/2018 14:56    Above KUB and CT scan imaging was personally reviewed today.  Overall, her stone burden appears to be stable.  The time of flank pain and microscopic full hematuria in the emergency room, she had no obvious signs obstructing stone.  Assessment & Plan:    1. Bilateral nephrolithiasis Stable  nonobstructive bilateral nephrolithiasis Recent stone episode in April based on symptoms and presence of microscopic blood, likely passed just prior to CT scan Reviewed her pain management strategy including intranasal Toradol as needed Given the size of the stones, would not recommend any intervention as she is able to pass it spontaneously with relative ease KUB essentially stable today We reviewed  option of repeat 24-hour urine analysis versus resumption of potassium citrate which was previously unable to tolerate.  Discussed alternative, litho-lite powder which she was given 2 samples.  Would recommend at least 20 mEq/day.  If she decides to pursue this, would like her to check a potassium in about a month.  She is agreeable this plan.  2. Urinary frequency Increased urinary frequency likely secondary to increase fluid intake as well as  probable underlying OAB We discussed alternatives including behavioral modification or pharmacotherapy This point time, her bother is relatively minimal and like to hold off on medicines She will call us if she like to pursue this in the future  F/u 1 year with KUB  Vanna ScotlandAshley Sharese Manrique, MD  [facility] [address], [address] [place], [place] [address] [phone]

## 2018-08-18 ENCOUNTER — Other Ambulatory Visit: Payer: Self-pay

## 2018-08-18 ENCOUNTER — Ambulatory Visit
Admission: RE | Admit: 2018-08-18 | Discharge: 2018-08-18 | Disposition: A | Discharge status: Home / Self Care | Payer: BC Managed Care – PPO | Source: Ambulatory Visit | Attending: Physician Assistant | Admitting: Physician Assistant

## 2018-08-18 DIAGNOSIS — Z1231 Encounter for screening mammogram for malignant neoplasm of breast: Secondary | ICD-10-CM | POA: Diagnosis not present

## 2018-08-18 DIAGNOSIS — Z5181 Encounter for therapeutic drug level monitoring: Secondary | ICD-10-CM | POA: Diagnosis not present

## 2018-08-18 DIAGNOSIS — M5416 Radiculopathy, lumbar region: Secondary | ICD-10-CM | POA: Diagnosis not present

## 2018-08-18 DIAGNOSIS — Z803 Family history of malignant neoplasm of breast: Secondary | ICD-10-CM | POA: Diagnosis not present

## 2018-08-18 DIAGNOSIS — Z1239 Encounter for other screening for malignant neoplasm of breast: Secondary | ICD-10-CM

## 2018-08-18 DIAGNOSIS — M47817 Spondylosis without myelopathy or radiculopathy, lumbosacral region: Secondary | ICD-10-CM | POA: Diagnosis not present

## 2018-08-18 DIAGNOSIS — Z79899 Other long term (current) drug therapy: Secondary | ICD-10-CM | POA: Diagnosis not present

## 2018-09-01 DIAGNOSIS — M47817 Spondylosis without myelopathy or radiculopathy, lumbosacral region: Secondary | ICD-10-CM | POA: Diagnosis not present

## 2018-09-04 DIAGNOSIS — R202 Paresthesia of skin: Secondary | ICD-10-CM | POA: Diagnosis not present

## 2018-09-08 DIAGNOSIS — M47817 Spondylosis without myelopathy or radiculopathy, lumbosacral region: Secondary | ICD-10-CM | POA: Diagnosis not present

## 2018-09-14 ENCOUNTER — Encounter: Payer: Self-pay | Admitting: Physician Assistant

## 2018-09-14 DIAGNOSIS — Z79899 Other long term (current) drug therapy: Secondary | ICD-10-CM | POA: Diagnosis not present

## 2018-09-14 DIAGNOSIS — F411 Generalized anxiety disorder: Secondary | ICD-10-CM

## 2018-09-14 DIAGNOSIS — M5416 Radiculopathy, lumbar region: Secondary | ICD-10-CM | POA: Diagnosis not present

## 2018-09-14 DIAGNOSIS — M47817 Spondylosis without myelopathy or radiculopathy, lumbosacral region: Secondary | ICD-10-CM | POA: Diagnosis not present

## 2018-09-14 DIAGNOSIS — F418 Other specified anxiety disorders: Secondary | ICD-10-CM | POA: Diagnosis not present

## 2018-10-09 ENCOUNTER — Ambulatory Visit (INDEPENDENT_AMBULATORY_CARE_PROVIDER_SITE_OTHER): Payer: BC Managed Care – PPO | Admitting: Obstetrics & Gynecology

## 2018-10-09 ENCOUNTER — Other Ambulatory Visit: Payer: Self-pay

## 2018-10-09 ENCOUNTER — Encounter: Payer: Self-pay | Admitting: Obstetrics & Gynecology

## 2018-10-09 VITALS — BP 110/60 | Ht 67.0 in | Wt 194.0 lb

## 2018-10-09 DIAGNOSIS — R1031 Right lower quadrant pain: Secondary | ICD-10-CM | POA: Diagnosis not present

## 2018-10-09 DIAGNOSIS — N83201 Unspecified ovarian cyst, right side: Secondary | ICD-10-CM | POA: Diagnosis not present

## 2018-10-09 DIAGNOSIS — G43719 Chronic migraine without aura, intractable, without status migrainosus: Secondary | ICD-10-CM | POA: Diagnosis not present

## 2018-10-09 NOTE — Progress Notes (Signed)
Gynecology Pelvic Pain Evaluation   Chief Complaint:  Chief Complaint  Patient presents with  . Ovarian Cyst    right side     History of Present Illness:   Patient is a 38 y.o. [id number] who LMP was No LMP recorded. Patient has had a hysterectomy., presents today for a problem visit.  She complains of pain.   Her pain is localized to the RLQ area, described as intermittent, sharp and stabbing, began several months ago and its severity is described as severe. The pain radiates to the  Non-radiating. She has these associated symptoms which include bloating/abdominal distension. Patient has these modifiers which include nothing that make it better and unable to associate with any factor that make it worse.  Context includes: spontaneous.  Pain last severely for about 2 hours and then mildly for anoither 24 hours; happens almost monthly and always right sided.  Pt has h/o right ovarian cysts with 2 prior laparoscopies for cyst removal and also a laparoscopy for appendicitis where right ovarian cyst was noted (not treated) then; also has been on Clomid in years past with severe right sided pain as side effect to taking Clomid.  Previous evaluation: [place] and evals in past.  Last surgery was 08/2017 for right ovarian cystectomy. Prior Diagnosis: prior ovarian cyst rupture. Previous Treatment: Surgery.  PMHx: She  has a past medical history of Acute pyelonephritis (06/29/2012), Anxiety, Asthma, Complication of anesthesia, Gallstones, GERD (gastroesophageal reflux disease), History of kidney stones, Kidney stones, Migraine, Pelvic pain in female, PONV (postoperative nausea and vomiting), Pyelonephritis (2015), Renal mass, and Tobacco abuse. Also,  has a past surgical history that includes Abdominal hysterectomy (2006); Tonsillectomy; Lithotripsy; Cholecystectomy (03/11/2011); Cholecystectomy, laparoscopic; ORIF ankle fracture (12/25/2011); Cast application (18/29/9371); Appendectomy (07/26/13); Ankle arthroscopy  (Left); Ankle arthroscopy (Left, 11/24/2015); Cystoscopy/ureteroscopy/holmium laser/stent placement (Left, 06/18/2016); and Laparoscopic ovarian cystectomy (Right, 09/02/2017)., family history includes Diabetes in her father and mother; Heart failure in her father; Hypertension in her father; Nephrolithiasis in her father; Pulmonary fibrosis in her father.,  reports that she quit smoking about 4 years ago. Her smoking use included cigarettes. She started smoking about 18 years ago. She has a 7.50 pack-year smoking history. She has never used smokeless tobacco. She reports current alcohol use. She reports that she does not use drugs.  She has a current medication list which includes the following prescription(s): alprazolam, baclofen, vitamin d3, gabapentin, metronidazole, multivitamin with minerals, omeprazole, onabotulinumtoxina, polyethylene glycol, promethazine, valacyclovir, chlorpromazine, duloxetine, hydrocodone-acetaminophen, and phentermine. Also, is allergic to compazine and metoclopramide.  Review of Systems  All other systems reviewed and are negative.   Objective: BP 110/60   Ht 5\' 7"  (1.702 m)   Wt 194 lb (88 kg)   BMI 30.38 kg/m  Physical Exam Constitutional:      General: She is not in acute distress.    Appearance: She is well-developed.  Abdominal:     General: Abdomen is flat. Bowel sounds are normal.     Palpations: Abdomen is soft.     Tenderness: There is abdominal tenderness in the right lower quadrant. There is no guarding or rebound. Negative signs include Murphy's sign and McBurney's sign.     Hernia: No hernia is present.     Comments: Mild RLQ T to Palp  Musculoskeletal: Normal range of motion.  Neurological:     Mental Status: She is alert and oriented to person, place, and time.  Skin:    General: Skin is warm and dry.  Vitals signs  reviewed.    Assessment: 38 y.o. [id number] with Recurrent Right Ovarian Cyst, with Severe Pain when occurs.   ICD-10-CM   1. RLQ  abdominal pain  R10.31   2. Ovarian cyst, right  N83.201    Options for serial surveillance to identify ovarian cyst as true etiology to her pain, vs surgery to remove ovary and monitor improvement thereafter.  Preservation of left ovary discussed and its ability to maintain hormone status until natural menopause.  Ovary likely culprit and removal should prevent future pain episodes, while preserving hormonal status.  Risks however of another surgery discussed.  She prefers this option.  Annamarie MajorPaul , MD, Merlinda FrederickFACOG [facility], [facility] 10/09/2018  1:51 PM

## 2018-10-15 ENCOUNTER — Telehealth: Payer: Self-pay | Admitting: Obstetrics & Gynecology

## 2018-10-15 NOTE — Telephone Encounter (Signed)
Patient is aware of H&P on 10/16/18 @ 1:30pm, Pre-admit testing phone interview to be scheduled, COVID testing on 10/19/18, and OR on 10/22/18. Patient is aware she will be asked to quarantine after COVID testing. Patient is aware she may receive calls from the [facility] and [facility]. Patient confirmed BCBS, and no secondary insurance.

## 2018-10-16 ENCOUNTER — Encounter: Payer: Self-pay | Admitting: Obstetrics & Gynecology

## 2018-10-16 ENCOUNTER — Other Ambulatory Visit: Payer: Self-pay

## 2018-10-16 ENCOUNTER — Ambulatory Visit (INDEPENDENT_AMBULATORY_CARE_PROVIDER_SITE_OTHER): Payer: BC Managed Care – PPO | Admitting: Obstetrics & Gynecology

## 2018-10-16 VITALS — BP 120/80 | Ht 67.0 in | Wt 196.0 lb

## 2018-10-16 DIAGNOSIS — R1031 Right lower quadrant pain: Secondary | ICD-10-CM

## 2018-10-16 DIAGNOSIS — N83201 Unspecified ovarian cyst, right side: Secondary | ICD-10-CM

## 2018-10-16 NOTE — H&P (View-Only) (Signed)
PRE-OPERATIVE HISTORY AND PHYSICAL EXAM  HPI:  [name] is a 38 y.o. [id number] No LMP recorded. Patient has had a hysterectomy.; she is being admitted for surgery related to pelvic pain and recurrent right ovarian cyst.   Her pain is localized to the RLQ area, described as intermittent, sharp and stabbing, began several months ago and its severity is described as severe. The pain is Non-radiating. She has these associated symptoms which include bloating/abdominal distension. Patient has these modifiers which include nothing that make it better and unable to associate with any factor that make it worse.  Context includes: spontaneous.  Pain last severely for about 2 hours and then mildly for anoither 24 hours; happens almost monthly and always right sided.  Pt has h/o right ovarian cysts with 2 prior laparoscopies for cyst removal and also a laparoscopy for appendicitis where right ovarian cyst was noted (not treated) then; also has been on Clomid in years past with severe right sided pain as side effect to taking Clomid.  Previous evaluation: [place] and evals in past.  Last surgery was 08/2017 for right ovarian cystectomy. Prior Diagnosis: prior ovarian cyst rupture.  PMHx: Past Medical History:  Diagnosis Date   Acute pyelonephritis 06/29/2012   Anxiety    Asthma    AS A CHILD   Complication of anesthesia    TROUBLE URINATING IN THE PAST   Gallstones    GERD (gastroesophageal reflux disease)    History of kidney stones    Kidney stones    bilateral   Migraine    Pelvic pain in female    PONV (postoperative nausea and vomiting)    WITH HYSTERECTOMY ONLY   Pyelonephritis 2015   Renal mass    right   Tobacco abuse    Past Surgical History:  Procedure Laterality Date   ABDOMINAL HYSTERECTOMY  2006   ANKLE ARTHROSCOPY Left    ANKLE ARTHROSCOPY Left 11/24/2015   APPENDECTOMY  05/30/99   CAST APPLICATION  02/72/5366   Procedure: CAST APPLICATION;  Surgeon:  Johnn Hai, MD;  Location: [facility];  Service: Orthopedics;  Laterality: Left;   CHOLECYSTECTOMY  03/11/2011   Procedure: LAPAROSCOPIC CHOLECYSTECTOMY WITH INTRAOPERATIVE CHOLANGIOGRAM;  Surgeon: Gayland Curry, MD;  Location: [place];  Service: General;  Laterality: N/A;   CHOLECYSTECTOMY, LAPAROSCOPIC     CYSTOSCOPY/URETEROSCOPY/HOLMIUM LASER/STENT PLACEMENT Left 06/18/2016   Procedure: CYSTOSCOPY/URETEROSCOPY/HOLMIUM LASER/STENT PLACEMENT;  Surgeon: Hollice Espy, MD;  Location: [facility];  Service: Urology;  Laterality: Left;   LAPAROSCOPIC OVARIAN CYSTECTOMY Right 09/02/2017   Procedure: LAPAROSCOPIC OVARIAN CYSTECTOMY;  Surgeon: Gae Dry, MD;  Location: [facility];  Service: Gynecology;  Laterality: Right;   LITHOTRIPSY     x 4   ORIF ANKLE FRACTURE  12/25/2011   Procedure: OPEN REDUCTION INTERNAL FIXATION (ORIF) ANKLE FRACTURE;  Surgeon: Johnn Hai, MD;  Location: [facility];  Service: Orthopedics;  Laterality: Left;   TONSILLECTOMY     Family History  Problem Relation Age of Onset   Diabetes Mother    Diabetes Father    Heart failure Father    Hypertension Father    Pulmonary fibrosis Father    Nephrolithiasis Father    Kidney cancer Neg Hx    Bladder Cancer Neg Hx    Social History   Tobacco Use   Smoking status: Former Smoker    Packs/day: 0.50    Years: 15.00    Pack years: 7.50    Types: Cigarettes  Start date: 11/03/1999    Quit date: 09/26/2014    Years since quitting: 4.0   Smokeless tobacco: Never Used  Substance Use Topics   Alcohol use: Yes    Alcohol/week: 0.0 standard drinks    Comment: social-rare   Drug use: No    Current Outpatient Medications:    ALPRAZolam (XANAX) 1 MG tablet, TAKE 1/2 TO 1 TABLET BY MOUTH 3 TIMES A DAY AS NEEDED FOR ANXIETY (Patient taking differently: Take 1 mg by mouth at bedtime. ), Disp: 90 tablet, Rfl: 5   baclofen (LIORESAL) 10 MG tablet, Take 10 mg by mouth 2 (two) times daily as needed (migraines).  , Disp: , Rfl: 0   chlorproMAZINE (THORAZINE) 25 MG tablet, Take 25 mg by mouth every 4 (four) hours as needed (migraines). , Disp: , Rfl:    Cholecalciferol (VITAMIN D3) 5000 units CAPS, Take 5,000 Units by mouth daily., Disp: , Rfl:    DULoxetine (CYMBALTA) 20 MG capsule, Take 20 mg by mouth at bedtime. , Disp: , Rfl:    gabapentin (NEURONTIN) 400 MG capsule, TAKE 1 CAPSULE BY MOUTH 2 TIMES DAILY (Patient taking differently: Take 400 mg by mouth 3 (three) times daily. ), Disp: 180 capsule, Rfl: 1   HYDROcodone-acetaminophen (NORCO) 10-325 MG tablet, Take 0.5-1 tablets by mouth every 6 (six) hours as needed for severe pain. Max 2 tabs per day, Disp: , Rfl:    meloxicam (MOBIC) 7.5 MG tablet, Take 7.5 mg by mouth daily., Disp: , Rfl:    metroNIDAZOLE (METROGEL) 0.75 % vaginal gel, Use one applicator intravaginally after sexual intercourse (Patient taking differently: Place 1 Applicatorful vaginally daily as needed (after sexual intercourse). ), Disp: 70 g, Rfl: 3   Multiple Vitamin (MULTIVITAMIN WITH MINERALS) TABS tablet, Take 1 tablet by mouth daily., Disp: , Rfl:    naloxone (NARCAN) 4 MG/0.1ML LIQD nasal spray kit, Place 1 spray into the nose as needed (opioid overdose)., Disp: , Rfl:    omeprazole (PRILOSEC) 20 MG capsule, TAKE 1 CAPSULE BY MOUTH ONCE DAILY (Patient taking differently: Take 20 mg by mouth at bedtime. ), Disp: 90 capsule, Rfl: 1   OnabotulinumtoxinA (BOTOX IJ), Inject 1 each as directed every 3 (three) months. Alternating with trigger point inj, Disp: , Rfl:    polyethylene glycol (MIRALAX / GLYCOLAX) packet, Take 17 g by mouth every other day. , Disp: , Rfl:    promethazine (PHENERGAN) 25 MG tablet, Take 1 tablet (25 mg total) by mouth every 8 (eight) hours as needed for nausea or vomiting., Disp: 30 tablet, Rfl: 0   valACYclovir (VALTREX) 500 MG tablet, Take 2 tablets (1,000 mg total) by mouth 2 (two) times daily as needed (cold sores)., Disp: 20 tablet, Rfl:  5 Allergies: Compazine and Metoclopramide  Review of Systems  Constitutional: Negative for chills, fever and malaise/fatigue.  HENT: Negative for congestion, sinus pain and sore throat.   Eyes: Negative for blurred vision and pain.  Respiratory: Negative for cough and wheezing.   Cardiovascular: Negative for chest pain and leg swelling.  Gastrointestinal: Negative for abdominal pain, constipation, diarrhea, heartburn, nausea and vomiting.  Genitourinary: Negative for dysuria, frequency, hematuria and urgency.  Musculoskeletal: Negative for back pain, joint pain, myalgias and neck pain.  Skin: Negative for itching and rash.  Neurological: Negative for dizziness, tremors and weakness.  Endo/Heme/Allergies: Does not bruise/bleed easily.  Psychiatric/Behavioral: Negative for depression. The patient is not nervous/anxious and does not have insomnia.     Objective: There were no vitals taken  for this visit. There were no vitals filed for this visit. Physical Exam Constitutional:      General: She is not in acute distress.    Appearance: She is well-developed.  HENT:     Head: Normocephalic and atraumatic. No laceration.     Right Ear: Hearing normal.     Left Ear: Hearing normal.     Mouth/Throat:     Pharynx: Uvula midline.  Eyes:     Pupils: Pupils are equal, round, and reactive to light.  Neck:     Musculoskeletal: Normal range of motion and neck supple.     Thyroid: No thyromegaly.  Cardiovascular:     Rate and Rhythm: Normal rate and regular rhythm.     Heart sounds: No murmur. No friction rub. No gallop.   Pulmonary:     Effort: Pulmonary effort is normal. No respiratory distress.     Breath sounds: Normal breath sounds. No wheezing.  Chest:     Breasts:        Right: No mass, skin change or tenderness.        Left: No mass, skin change or tenderness.  Abdominal:     General: Bowel sounds are normal. There is no distension.     Palpations: Abdomen is soft.      Tenderness: There is no abdominal tenderness. There is no rebound.  Musculoskeletal: Normal range of motion.  Neurological:     Mental Status: She is alert and oriented to person, place, and time.     Cranial Nerves: No cranial nerve deficit.  Skin:    General: Skin is warm and dry.  Psychiatric:        Judgment: Judgment normal.  Vitals signs reviewed.     Assessment: 1. RLQ abdominal pain   2. Ovarian cyst, right   Plan removal of right ovary by laparoscopy  I have had a careful discussion with this patient about all the options available and the risk/benefits of each. I have fully informed this patient that surgery may subject her to a variety of discomforts and risks: She understands that most patients have surgery with little difficulty, but problems can happen ranging from minor to fatal. These include nausea, vomiting, pain, bleeding, infection, poor healing, hernia, or formation of adhesions. Unexpected reactions may occur from any drug or anesthetic given. Unintended injury may occur to other pelvic or abdominal structures such as Fallopian tubes, ovaries, bladder, ureter (tube from kidney to bladder), or bowel. Nerves going from the pelvis to the legs may be injured. Any such injury may require immediate or later additional surgery to correct the problem. Excessive blood loss requiring transfusion is very unlikely but possible. Dangerous blood clots may form in the legs or lungs. Physical and sexual activity will be restricted in varying degrees for an indeterminate period of time but most often 2-6 weeks.  Finally, she understands that it is impossible to list every possible undesirable effect and that the condition for which surgery is done is not always cured or significantly improved, and in rare cases may be even worse.Ample time was given to answer all questions.  Barnett Applebaum, MD, [facility], [facility] 10/16/2018  11:44 AM

## 2018-10-16 NOTE — Patient Instructions (Signed)
Ovarian Cystectomy, Care After °This sheet gives you information about how to care for yourself after your procedure. Your health care provider may also give you more specific instructions. If you have problems or questions, contact your health care provider. °What can I expect after the procedure? °After the procedure, it is common to have: °· Pain in your abdomen, especially at the incision areas. You will be given pain medicines to control the pain. °· Tiredness. This is a normal part of the recovery process. Your energy level will return to normal over the next several weeks. °· Problems passing stool (constipation). °Follow these instructions at home: °Medicines °· Take over-the-counter and prescription medicines only as told by your health care provider. °· If you were prescribed an antibiotic medicine, use it as told by your health care provider. Do not stop using the antibiotic even if you start to feel better. °· Do not take aspirin because it can cause bleeding. °· Do not drink alcohol while taking prescription pain medicine. °· Do not drive or use heavy machinery while taking prescription pain medicine. °Incision care ° °· Follow instructions from your health care provider about how to take care of your incisions. Make sure you: °? Wash your hands with soap and water before you change your bandage (dressing). If soap and water are not available, use hand sanitizer. °? Change your dressing as told by your health care provider. °? Leave stitches (sutures), skin glue, or adhesive strips in place. These skin closures may need to stay in place for 2 weeks or longer. If adhesive strip edges start to loosen and curl up, you may trim the loose edges. Do not remove adhesive strips completely unless your health care provider tells you to do that. °· Check your incision areas every day for signs of infection. Check for: °? Redness, swelling, or pain. °? Fluid or blood. °? Warmth. °? Pus or a bad smell. °· Do not  take baths, swim, or use a hot tub until your health care provider approves. Take showers instead of baths. °Activity °· Return to your normal activities and diet as told by your health care provider. Ask your health care provider what activities are safe for you. °· Take rest breaks during the day as needed. °· Do not drive until your health care provider approves. °General instructions °· Do not douche, use tampons, or have sexual intercourse until your health care provider says it is okay to do so. °· To prevent or treat constipation while you are taking prescription pain medicine, your health care provider may recommend that you: °? Take over-the-counter or prescription medicines. °? Eat foods that are high in fiber, such as fresh fruits and vegetables, whole grains, and beans. °? Drink enough fluid to keep your urine clear or pale yellow. °? Limit foods that are high in fat and processed sugars, such as fried and sweet foods. °· Keep all follow-up visits as told by your health care provider. This is important. °Contact a health care provider if: °· You have a fever. °· You feel nauseous or you vomit. °· You have pain when you urinate or have blood in your urine. °· You have a rash on your body. °· You have pain or redness where the IV was inserted. °· You have pain that is not relieved with medicine. °· You have signs of infection, such as: °? Redness, swelling, or pain around your incisions. °? Fluid or blood coming from your incisions. °? An incision that   feels warm to the touch. °? Pus or a bad smell coming from your incisions. °Get help right away if: °· You have chest pain or shortness of breath. °· You feel dizzy or light-headed. °· You have increasing abdominal pain that is not relieved with medicines. °· You have pain, swelling, or redness in your leg. °· Your incision is opening (the edges are not staying together). °Summary °· After the procedure, it is common to have some pain in your abdomen. You  will be given pain medicines to control the pain. °· Follow instructions from your health care provider about how to take care of your incisions. °· Do not douche, use tampons, or have sexual intercourse until your health care provider says it is okay to do so. °· Keep all follow-up visits as told by your health care provider. This is important. °This information is not intended to replace advice given to you by your health care provider. Make sure you discuss any questions you have with your health care provider. °Document Released: 12/02/2012 Document Revised: 01/24/2017 Document Reviewed: 04/02/2016 °Elsevier Patient Education © 2020 [employer] ° °

## 2018-10-16 NOTE — Progress Notes (Signed)
°   PRE-OPERATIVE HISTORY AND PHYSICAL EXAM ° °HPI:  [name] is a 38 y.o. G2P2002 No LMP recorded. Patient has had a hysterectomy.; she is being admitted for surgery related to pelvic pain and recurrent right ovarian cyst.   Her pain is localized to the RLQ area, described as intermittent, sharp and stabbing, began several months ago and its severity is described as severe. The pain is Non-radiating. She has these associated symptoms which include bloating/abdominal distension. Patient has these modifiers which include nothing that make it better and unable to associate with any factor that make it worse.  Context includes: spontaneous.  Pain last severely for about 2 hours and then mildly for anoither 24 hours; happens almost monthly and always right sided.  Pt has h/o right ovarian cysts with 2 prior laparoscopies for cyst removal and also a laparoscopy for appendicitis where right ovarian cyst was noted (not treated) then; also has been on Clomid in years past with severe right sided pain as side effect to taking Clomid. °  °Previous evaluation: [place] and ev[place] in past.  Last surgery was 08/2017 for right ovarian cystectomy. Prior Diagnosis: prior ovarian cyst rupture. ° °PMHx: °Past Medical History:  °Diagnosis Date  °• Acute pyelonephritis 06/29/2012  °• Anxiety   °• Asthma   ° AS A CHILD  °• Complication of anesthesia   ° TROUBLE URINATING IN THE PAST  °• Gallstones   °• GERD (gastroesophageal reflux disease)   °• History of kidney stones   °• Kidney stones   ° bilateral  °• Migraine   °• Pelvic pain in female   °• PONV (postoperative nausea and vomiting)   ° WITH HYSTERECTOMY ONLY  °• Pyelonephritis 2015  °• Renal mass   ° right  °• Tobacco abuse   ° °Past Surgical History:  °Procedure Laterality Date  °• ABDOMINAL HYSTERECTOMY  2006  °• ANKLE ARTHROSCOPY Left   °• ANKLE ARTHROSCOPY Left 11/24/2015  °• APPENDECTOMY  07/26/13  °• CAST APPLICATION  12/25/2011  ° Procedure: CAST APPLICATION;  Surgeon:  Jeffrey C Beane, MD;  Location: [facility];  Service: Orthopedics;  Laterality: Left;  °• CHOLECYSTECTOMY  03/11/2011  ° Procedure: LAPAROSCOPIC CHOLECYSTECTOMY WITH INTRAOPERATIVE CHOLANGIOGRAM;  Surgeon: Eric M Wilson, MD;  Location: [facility];  Service: General;  Laterality: N/A;  °• CHOLECYSTECTOMY, LAPAROSCOPIC    °• CYSTOSCOPY/URETEROSCOPY/HOLMIUM LASER/STENT PLACEMENT Left 06/18/2016  ° Procedure: CYSTOSCOPY/URETEROSCOPY/HOLMIUM LASER/STENT PLACEMENT;  Surgeon: Ashley Brandon, MD;  Location: [facility];  Service: Urology;  Laterality: Left;  °• LAPAROSCOPIC OVARIAN CYSTECTOMY Right 09/02/2017  ° Procedure: LAPAROSCOPIC OVARIAN CYSTECTOMY;  Surgeon: Blanchard Willhite P, MD;  Location: [facility];  Service: Gynecology;  Laterality: Right;  °• LITHOTRIPSY    ° x 4  °• ORIF ANKLE FRACTURE  12/25/2011  ° Procedure: OPEN REDUCTION INTERNAL FIXATION (ORIF) ANKLE FRACTURE;  Surgeon: Jeffrey C Beane, MD;  Location: [facility];  Service: Orthopedics;  Laterality: Left;  °• TONSILLECTOMY    ° °Family History  °Problem Relation Age of Onset  °• Diabetes Mother   °• Diabetes Father   °• Heart failure Father   °• Hypertension Father   °• Pulmonary fibrosis Father   °• Nephrolithiasis Father   °• Kidney cancer Neg Hx   °• Bladder Cancer Neg Hx   ° °Social History  ° °Tobacco Use  °• Smoking status: Former Smoker  °  Packs/day: 0.50  °  Years: 15.00  °  Pack years: 7.50  °  Types: Cigarettes  °    Start date: 11/03/1999  °  Quit date: 09/26/2014  °  Years since quitting: 4.0  °• Smokeless tobacco: Never Used  °Substance Use Topics  °• Alcohol use: Yes  °  Alcohol/week: 0.0 standard drinks  °  Comment: social-rare  °• Drug use: No  ° ° °Current Outpatient Medications:  °•  ALPRAZolam (XANAX) 1 MG tablet, TAKE 1/2 TO 1 TABLET BY MOUTH 3 TIMES A DAY AS NEEDED FOR ANXIETY (Patient taking differently: Take 1 mg by mouth at bedtime. ), Disp: 90 tablet, Rfl: 5 °•  baclofen (LIORESAL) 10 MG tablet, Take 10 mg by mouth 2 (two) times daily as needed (migraines).  , Disp: , Rfl: 0 °•  chlorproMAZINE (THORAZINE) 25 MG tablet, Take 25 mg by mouth every 4 (four) hours as needed (migraines). , Disp: , Rfl:  °•  Cholecalciferol (VITAMIN D3) 5000 units CAPS, Take 5,000 Units by mouth daily., Disp: , Rfl:  °•  DULoxetine (CYMBALTA) 20 MG capsule, Take 20 mg by mouth at bedtime. , Disp: , Rfl:  °•  gabapentin (NEURONTIN) 400 MG capsule, TAKE 1 CAPSULE BY MOUTH 2 TIMES DAILY (Patient taking differently: Take 400 mg by mouth 3 (three) times daily. ), Disp: 180 capsule, Rfl: 1 °•  HYDROcodone-acetaminophen (NORCO) 10-325 MG tablet, Take 0.5-1 tablets by mouth every 6 (six) hours as needed for severe pain. Max 2 tabs per day, Disp: , Rfl:  °•  meloxicam (MOBIC) 7.5 MG tablet, Take 7.5 mg by mouth daily., Disp: , Rfl:  °•  metroNIDAZOLE (METROGEL) 0.75 % vaginal gel, Use one applicator intravaginally after sexual intercourse (Patient taking differently: Place 1 Applicatorful vaginally daily as needed (after sexual intercourse). ), Disp: 70 g, Rfl: 3 °•  Multiple Vitamin (MULTIVITAMIN WITH MINERALS) TABS tablet, Take 1 tablet by mouth daily., Disp: , Rfl:  °•  naloxone (NARCAN) 4 MG/0.1ML LIQD nasal spray kit, Place 1 spray into the nose as needed (opioid overdose)., Disp: , Rfl:  °•  omeprazole (PRILOSEC) 20 MG capsule, TAKE 1 CAPSULE BY MOUTH ONCE DAILY (Patient taking differently: Take 20 mg by mouth at bedtime. ), Disp: 90 capsule, Rfl: 1 °•  OnabotulinumtoxinA (BOTOX IJ), Inject 1 each as directed every 3 (three) months. Alternating with trigger point inj, Disp: , Rfl:  °•  polyethylene glycol (MIRALAX / GLYCOLAX) packet, Take 17 g by mouth every other day. , Disp: , Rfl:  °•  promethazine (PHENERGAN) 25 MG tablet, Take 1 tablet (25 mg total) by mouth every 8 (eight) hours as needed for nausea or vomiting., Disp: 30 tablet, Rfl: 0 °•  valACYclovir (VALTREX) 500 MG tablet, Take 2 tablets (1,000 mg total) by mouth 2 (two) times daily as needed (cold sores)., Disp: 20 tablet, Rfl:  5 °Allergies: Compazine and Metoclopramide ° °Review of Systems  °Constitutional: Negative for chills, fever and malaise/fatigue.  °HENT: Negative for congestion, sinus pain and sore throat.   °Eyes: Negative for blurred vision and pain.  °Respiratory: Negative for cough and wheezing.   °Cardiovascular: Negative for chest pain and leg swelling.  °Gastrointestinal: Negative for abdominal pain, constipation, diarrhea, heartburn, nausea and vomiting.  °Genitourinary: Negative for dysuria, frequency, hematuria and urgency.  °Musculoskeletal: Negative for back pain, joint pain, myalgias and neck pain.  °Skin: Negative for itching and rash.  °Neurological: Negative for dizziness, tremors and weakness.  °Endo/Heme/Allergies: Does not bruise/bleed easily.  °Psychiatric/Behavioral: Negative for depression. The patient is not nervous/anxious and does not have insomnia.   ° ° °Objective: °There were no vitals taken   for this visit. There were no vitals filed for this visit. Physical Exam Constitutional:      General: She is not in acute distress.    Appearance: She is well-developed.  HENT:     Head: Normocephalic and atraumatic. No laceration.     Right Ear: Hearing normal.     Left Ear: Hearing normal.     Mouth/Throat:     Pharynx: Uvula midline.  Eyes:     Pupils: Pupils are equal, round, and reactive to light.  Neck:     Musculoskeletal: Normal range of motion and neck supple.     Thyroid: No thyromegaly.  Cardiovascular:     Rate and Rhythm: Normal rate and regular rhythm.     Heart sounds: No murmur. No friction rub. No gallop.   Pulmonary:     Effort: Pulmonary effort is normal. No respiratory distress.     Breath sounds: Normal breath sounds. No wheezing.  Chest:     Breasts:        Right: No mass, skin change or tenderness.        Left: No mass, skin change or tenderness.  Abdominal:     General: Bowel sounds are normal. There is no distension.     Palpations: Abdomen is soft.      Tenderness: There is no abdominal tenderness. There is no rebound.  Musculoskeletal: Normal range of motion.  Neurological:     Mental Status: She is alert and oriented to person, place, and time.     Cranial Nerves: No cranial nerve deficit.  Skin:    General: Skin is warm and dry.  Psychiatric:        Judgment: Judgment normal.  Vitals signs reviewed.     Assessment: 1. RLQ abdominal pain   2. Ovarian cyst, right   Plan removal of right ovary by laparoscopy  I have had a careful discussion with this patient about all the options available and the risk/benefits of each. I have fully informed this patient that surgery may subject her to a variety of discomforts and risks: She understands that most patients have surgery with little difficulty, but problems can happen ranging from minor to fatal. These include nausea, vomiting, pain, bleeding, infection, poor healing, hernia, or formation of adhesions. Unexpected reactions may occur from any drug or anesthetic given. Unintended injury may occur to other pelvic or abdominal structures such as Fallopian tubes, ovaries, bladder, ureter (tube from kidney to bladder), or bowel. Nerves going from the pelvis to the legs may be injured. Any such injury may require immediate or later additional surgery to correct the problem. Excessive blood loss requiring transfusion is very unlikely but possible. Dangerous blood clots may form in the legs or lungs. Physical and sexual activity will be restricted in varying degrees for an indeterminate period of time but most often 2-6 weeks.  Finally, she understands that it is impossible to list every possible undesirable effect and that the condition for which surgery is done is not always cured or significantly improved, and in rare cases may be even worse.Ample time was given to answer all questions.  Barnett Applebaum, MD, [facility], [facility] 10/16/2018  11:44 AM

## 2018-10-19 ENCOUNTER — Encounter
Admission: RE | Admit: 2018-10-19 | Discharge: 2018-10-19 | Disposition: A | Discharge status: Home / Self Care | Payer: BC Managed Care – PPO | Source: Ambulatory Visit | Attending: Obstetrics & Gynecology | Admitting: Obstetrics & Gynecology

## 2018-10-19 ENCOUNTER — Other Ambulatory Visit: Payer: Self-pay

## 2018-10-19 DIAGNOSIS — Z20828 Contact with and (suspected) exposure to other viral communicable diseases: Secondary | ICD-10-CM | POA: Insufficient documentation

## 2018-10-19 DIAGNOSIS — Z01812 Encounter for preprocedural laboratory examination: Secondary | ICD-10-CM | POA: Insufficient documentation

## 2018-10-19 HISTORY — DX: Unspecified osteoarthritis, unspecified site: M19.90

## 2018-10-19 LAB — CBC
HCT: 42.4 % (ref 36.0–46.0)
Hemoglobin: 13.9 g/dL (ref 12.0–15.0)
MCH: 30.9 pg (ref 26.0–34.0)
MCHC: 32.8 g/dL (ref 30.0–36.0)
MCV: 94.2 fL (ref 80.0–100.0)
Platelets: 224 10*3/uL (ref 150–400)
RBC: 4.5 MIL/uL (ref 3.87–5.11)
RDW: 12.5 % (ref 11.5–15.5)
WBC: 7 10*3/uL (ref 4.0–10.5)
nRBC: 0 % (ref 0.0–0.2)

## 2018-10-19 LAB — SARS CORONAVIRUS 2 (TAT 6-24 HRS): SARS Coronavirus 2: NEGATIVE

## 2018-10-19 NOTE — Patient Instructions (Signed)
INSTRUCTIONS FOR SURGERY     Your surgery is scheduled for:   Thursday, October 22, 2018     To find out your arrival time for the day of surgery,          please call [phone] between 1 pm and 3 pm on :  Wednesday, October 21, 2018     When you arrive for surgery, report to the SECOND FLOOR OF THE MEDICAL MALL.       Do NOT stop on the first floor to register.    REMEMBER: Instructions that are not followed completely may result in serious medical risk,  up to and including death, or upon the discretion of your surgeon and anesthesiologist,            your surgery may need to be rescheduled.  __X__ 1. Do not eat food after midnight the night before your procedure.                    No gum, candy, lozenger, tic tacs, tums or hard candies.                  ABSOLUTELY NOTHING SOLID IN YOUR MOUTH AFTER MIDNIGHT                    You may drink unlimited clear liquids up to 2 hours before you are scheduled to arrive for surgery.                   Do not drink anything within those 2 hours unless you need to take medicine, then take the                   smallest amount you need.  Clear liquids include:  water, apple juice without pulp,                   any flavor Gatorade, Black coffee, black tea.  Sugar may be added but no dairy/ honey /lemon.                        Broth and jello is not considered a clear liquid.  __x__  2. On the morning of surgery, please brush your teeth with toothpaste and water. You may rinse with                  mouthwash if you wish but DO NOT SWALLOW TOOTHPASTE OR MOUTHWASH  __X___3. NO alcohol for 24 hours before or after surgery.  ____ 4.  Do NOT smoke or use e-cigarettes for 24 HOURS PRIOR TO SURGERY.                      DO NOT Use any chewable tobacco products for at least 6 hours prior to surgery.  __x___ 5. If you start any new medication after this appointment and prior to surgery, please                Bring it with you on the day of surgery.  ___x__ 6. Notify your doctor if there is any change in your medical  condition, such as fever, infection, vomitting,                   Diarrhea or any open sores.  __x___ 7.  USE the CHG SOAP as instructed, the night before surgery and the day of surgery.                   Once you have washed with this soap, do NOT use any of the following: Powders, perfumes                    or lotions. Please do not wear make up, hairpins, clips or nail polish. You MAY  wear deodorant.                   Men may shave their face and neck.  Women need to shave 48 hours prior to surgery.                   DO NOT wear ANY jewelry on the day of surgery. If there are rings that are too tight to                    remove easily, please address this prior to the surgery day. Piercings need to be removed.                                                                     NO METAL ON YOUR BODY.                    Do NOT bring any valuables.  If you came to Pre-Admit testing then you will not need license,                     insurance card or credit card.  If you will be staying overnight, please either leave your things in                     the car or have your family be responsible for these items.                     [facility] IS NOT RESPONSIBLE FOR BELONGINGS OR VALUABLES.  ___X__ 8. DO NOT wear contact lenses on surgery day.  You may not have dentures,                     Hearing aides, contacts or glasses in the operating room. These items can be                    Placed in the Recovery Room to receive immediately after surgery.  __x___ 9. IF YOU ARE SCHEDULED TO GO HOME ON THE SAME DAY, YOU MUST                   Have someone to drive you home and to stay with you  for the first 24 hours.                    Have an arrangement prior to arriving on surgery day.  ___x__ 10. Take the following medications on the  morning of surgery with a sip of  water:                              1. XANAX                     2. NEURONTIN                     3. PRILOSEC (take night before and morning of surgery)                     4.                     5.                     6.  _____ 11.  Follow any instructions provided to you by your surgeon.                        Such as enema, clear liquid bowel prep  __X__  12. STOP  ASPIRIN AS OF: today                       THIS INCLUDES BC POWDERS / GOODIES POWDER  __x___ 13. STOP Anti-inflammatories as of: today                      This includes IBUPROFEN / MOTRIN / ADVIL / ALEVE/ NAPROXYN / MOBIC                    YOU MAY TAKE TYLENOL ANY TIME PRIOR TO SURGERY.  __x___ 14.  Stop supplements until after surgery.                     This includes: multi vitamin                 You may continue taking Vitamin B12 / Vitamin D3 but do not take on the morning of surgery.  _____ 15. Bring your CPAP machine into preop with you on the morning of surgery.   ______16. If staying overnight, please have appropriate shoes to wear to be able to walk around the unit.                   Wear clean and comfortable clothing to the hospital.  Have a support pillow to press against your abdomen for pain support/   Have stool softeners on hand at home to take along with pain medication.  Take migraine medicine on the morning of surgery if migraine is unbearable.

## 2018-10-21 DIAGNOSIS — M5416 Radiculopathy, lumbar region: Secondary | ICD-10-CM | POA: Diagnosis not present

## 2018-10-21 DIAGNOSIS — F418 Other specified anxiety disorders: Secondary | ICD-10-CM | POA: Diagnosis not present

## 2018-10-21 DIAGNOSIS — Z79899 Other long term (current) drug therapy: Secondary | ICD-10-CM | POA: Diagnosis not present

## 2018-10-21 DIAGNOSIS — M47817 Spondylosis without myelopathy or radiculopathy, lumbosacral region: Secondary | ICD-10-CM | POA: Diagnosis not present

## 2018-10-21 LAB — TYPE AND SCREEN
ABO/RH(D): O POS
Antibody Screen: NEGATIVE

## 2018-10-22 ENCOUNTER — Ambulatory Visit
Admission: RE | Admit: 2018-10-22 | Discharge: 2018-10-22 | Disposition: A | Discharge status: Home / Self Care | Payer: BC Managed Care – PPO | Attending: Obstetrics & Gynecology | Admitting: Obstetrics & Gynecology

## 2018-10-22 ENCOUNTER — Ambulatory Visit: Payer: BC Managed Care – PPO | Admitting: Anesthesiology

## 2018-10-22 ENCOUNTER — Encounter
Admission: RE | Disposition: A | Discharge status: Home / Self Care | Payer: Self-pay | Source: Home / Self Care | Attending: Obstetrics & Gynecology

## 2018-10-22 ENCOUNTER — Other Ambulatory Visit: Payer: Self-pay

## 2018-10-22 ENCOUNTER — Other Ambulatory Visit: Payer: Self-pay | Admitting: Obstetrics & Gynecology

## 2018-10-22 ENCOUNTER — Encounter: Payer: Self-pay | Admitting: *Deleted

## 2018-10-22 DIAGNOSIS — R102 Pelvic and perineal pain unspecified side: Secondary | ICD-10-CM | POA: Diagnosis present

## 2018-10-22 DIAGNOSIS — N736 Female pelvic peritoneal adhesions (postinfective): Secondary | ICD-10-CM | POA: Insufficient documentation

## 2018-10-22 DIAGNOSIS — Z87442 Personal history of urinary calculi: Secondary | ICD-10-CM | POA: Diagnosis not present

## 2018-10-22 DIAGNOSIS — Z8249 Family history of ischemic heart disease and other diseases of the circulatory system: Secondary | ICD-10-CM | POA: Insufficient documentation

## 2018-10-22 DIAGNOSIS — G43909 Migraine, unspecified, not intractable, without status migrainosus: Secondary | ICD-10-CM | POA: Insufficient documentation

## 2018-10-22 DIAGNOSIS — R1031 Right lower quadrant pain: Secondary | ICD-10-CM | POA: Diagnosis not present

## 2018-10-22 DIAGNOSIS — N83201 Unspecified ovarian cyst, right side: Secondary | ICD-10-CM | POA: Insufficient documentation

## 2018-10-22 DIAGNOSIS — N83209 Unspecified ovarian cyst, unspecified side: Secondary | ICD-10-CM | POA: Diagnosis present

## 2018-10-22 DIAGNOSIS — Z836 Family history of other diseases of the respiratory system: Secondary | ICD-10-CM | POA: Insufficient documentation

## 2018-10-22 DIAGNOSIS — F419 Anxiety disorder, unspecified: Secondary | ICD-10-CM | POA: Insufficient documentation

## 2018-10-22 DIAGNOSIS — K219 Gastro-esophageal reflux disease without esophagitis: Secondary | ICD-10-CM | POA: Insufficient documentation

## 2018-10-22 DIAGNOSIS — Z841 Family history of disorders of kidney and ureter: Secondary | ICD-10-CM | POA: Diagnosis not present

## 2018-10-22 DIAGNOSIS — Z791 Long term (current) use of non-steroidal anti-inflammatories (NSAID): Secondary | ICD-10-CM | POA: Insufficient documentation

## 2018-10-22 DIAGNOSIS — Z87891 Personal history of nicotine dependence: Secondary | ICD-10-CM | POA: Insufficient documentation

## 2018-10-22 DIAGNOSIS — Z9049 Acquired absence of other specified parts of digestive tract: Secondary | ICD-10-CM | POA: Diagnosis not present

## 2018-10-22 DIAGNOSIS — Z90710 Acquired absence of both cervix and uterus: Secondary | ICD-10-CM | POA: Diagnosis not present

## 2018-10-22 DIAGNOSIS — Z888 Allergy status to other drugs, medicaments and biological substances status: Secondary | ICD-10-CM | POA: Diagnosis not present

## 2018-10-22 DIAGNOSIS — N838 Other noninflammatory disorders of ovary, fallopian tube and broad ligament: Secondary | ICD-10-CM | POA: Insufficient documentation

## 2018-10-22 DIAGNOSIS — Z79899 Other long term (current) drug therapy: Secondary | ICD-10-CM | POA: Insufficient documentation

## 2018-10-22 DIAGNOSIS — Z833 Family history of diabetes mellitus: Secondary | ICD-10-CM | POA: Diagnosis not present

## 2018-10-22 SURGERY — OOPHORECTOMY, LAPAROSCOPIC
Anesthesia: General | Site: Abdomen | Laterality: Right

## 2018-10-22 MED ORDER — PROPOFOL 10 MG/ML IV BOLUS
INTRAVENOUS | Status: DC | PRN
  Administered 2018-10-22: 100 mg via INTRAVENOUS

## 2018-10-22 MED ORDER — ROCURONIUM BROMIDE 100 MG/10ML IV SOLN
INTRAVENOUS | Status: DC | PRN
  Administered 2018-10-22: 40 mg via INTRAVENOUS

## 2018-10-22 MED ORDER — OXYCODONE HCL 5 MG PO TABS
5.0000 mg | ORAL_TABLET | Freq: Once | ORAL | Status: AC | PRN
  Administered 2018-10-22: 19:00:00 5 mg via ORAL

## 2018-10-22 MED ORDER — FENTANYL CITRATE (PF) 100 MCG/2ML IJ SOLN
INTRAMUSCULAR | Status: AC
  Administered 2018-10-22: 25 ug via INTRAVENOUS
  Filled 2018-10-22: qty 2

## 2018-10-22 MED ORDER — KETOROLAC TROMETHAMINE 30 MG/ML IJ SOLN
INTRAMUSCULAR | Status: DC | PRN
  Administered 2018-10-22: 30 mg via INTRAVENOUS

## 2018-10-22 MED ORDER — SUGAMMADEX SODIUM 500 MG/5ML IV SOLN
INTRAVENOUS | Status: DC | PRN
  Administered 2018-10-22: 400 mg via INTRAVENOUS

## 2018-10-22 MED ORDER — SUGAMMADEX SODIUM 500 MG/5ML IV SOLN
INTRAVENOUS | Status: AC
  Filled 2018-10-22: qty 5

## 2018-10-22 MED ORDER — PROMETHAZINE HCL 25 MG/ML IJ SOLN: 6.2500 mg | INTRAMUSCULAR | Status: DC | PRN

## 2018-10-22 MED ORDER — LACTATED RINGERS IV SOLN: INTRAVENOUS | Status: DC

## 2018-10-22 MED ORDER — DEXAMETHASONE SODIUM PHOSPHATE 10 MG/ML IJ SOLN
INTRAMUSCULAR | Status: AC
  Filled 2018-10-22: qty 1

## 2018-10-22 MED ORDER — ONDANSETRON HCL 4 MG/2ML IJ SOLN
INTRAMUSCULAR | Status: DC | PRN
  Administered 2018-10-22: 4 mg via INTRAVENOUS

## 2018-10-22 MED ORDER — MORPHINE SULFATE (PF) 4 MG/ML IV SOLN: 1.0000 mg | INTRAVENOUS | Status: DC | PRN

## 2018-10-22 MED ORDER — LACTATED RINGERS IV SOLN
INTRAVENOUS | Status: DC
  Administered 2018-10-22: 14:00:00 via INTRAVENOUS

## 2018-10-22 MED ORDER — LIDOCAINE HCL (PF) 2 % IJ SOLN
INTRAMUSCULAR | Status: AC
  Filled 2018-10-22: qty 10

## 2018-10-22 MED ORDER — FENTANYL CITRATE (PF) 100 MCG/2ML IJ SOLN
25.0000 ug | INTRAMUSCULAR | Status: AC | PRN
  Administered 2018-10-22 (×6): 25 ug via INTRAVENOUS

## 2018-10-22 MED ORDER — MIDAZOLAM HCL 2 MG/2ML IJ SOLN
INTRAMUSCULAR | Status: AC
  Filled 2018-10-22: qty 2

## 2018-10-22 MED ORDER — ACETAMINOPHEN 650 MG RE SUPP
650.0000 mg | RECTAL | Status: DC | PRN
  Filled 2018-10-22: qty 1

## 2018-10-22 MED ORDER — OXYCODONE-ACETAMINOPHEN 5-325 MG PO TABS: 1.0000 | ORAL_TABLET | ORAL | Status: DC | PRN

## 2018-10-22 MED ORDER — DEXMEDETOMIDINE HCL IN NACL 200 MCG/50ML IV SOLN
INTRAVENOUS | Status: DC | PRN
  Administered 2018-10-22: 16 ug via INTRAVENOUS

## 2018-10-22 MED ORDER — ROCURONIUM BROMIDE 50 MG/5ML IV SOLN
INTRAVENOUS | Status: AC
  Filled 2018-10-22: qty 1

## 2018-10-22 MED ORDER — ACETAMINOPHEN 325 MG PO TABS: 650.0000 mg | ORAL_TABLET | ORAL | Status: DC | PRN

## 2018-10-22 MED ORDER — OXYCODONE HCL 5 MG/5ML PO SOLN: 5.0000 mg | Freq: Once | ORAL | Status: AC | PRN

## 2018-10-22 MED ORDER — MIDAZOLAM HCL 2 MG/2ML IJ SOLN
INTRAMUSCULAR | Status: DC | PRN
  Administered 2018-10-22: 2 mg via INTRAVENOUS

## 2018-10-22 MED ORDER — KETOROLAC TROMETHAMINE 30 MG/ML IJ SOLN
INTRAMUSCULAR | Status: AC
  Filled 2018-10-22: qty 1

## 2018-10-22 MED ORDER — ONDANSETRON 4 MG PO TBDP: 4.0000 mg | ORAL_TABLET | Freq: Four times a day (QID) | ORAL | 0 refills | Status: DC | PRN

## 2018-10-22 MED ORDER — SCOPOLAMINE 1 MG/3DAYS TD PT72
1.0000 | MEDICATED_PATCH | TRANSDERMAL | Status: DC
  Administered 2018-10-22: 14:00:00 1.5 mg via TRANSDERMAL

## 2018-10-22 MED ORDER — ACETAMINOPHEN NICU IV SYRINGE 10 MG/ML
INTRAVENOUS | Status: AC
  Filled 2018-10-22: qty 1

## 2018-10-22 MED ORDER — DEXAMETHASONE SODIUM PHOSPHATE 10 MG/ML IJ SOLN
INTRAMUSCULAR | Status: DC | PRN
  Administered 2018-10-22: 10 mg via INTRAVENOUS

## 2018-10-22 MED ORDER — FENTANYL CITRATE (PF) 100 MCG/2ML IJ SOLN
INTRAMUSCULAR | Status: AC
  Filled 2018-10-22: qty 2

## 2018-10-22 MED ORDER — OXYCODONE HCL 5 MG PO TABS
ORAL_TABLET | ORAL | Status: AC
  Filled 2018-10-22: qty 1

## 2018-10-22 MED ORDER — FENTANYL CITRATE (PF) 100 MCG/2ML IJ SOLN
INTRAMUSCULAR | Status: DC | PRN
  Administered 2018-10-22 (×2): 50 ug via INTRAVENOUS

## 2018-10-22 MED ORDER — LIDOCAINE HCL (CARDIAC) PF 100 MG/5ML IV SOSY
PREFILLED_SYRINGE | INTRAVENOUS | Status: DC | PRN
  Administered 2018-10-22: 100 mg via INTRAVENOUS

## 2018-10-22 MED ORDER — KETOROLAC TROMETHAMINE 30 MG/ML IJ SOLN: 30.0000 mg | Freq: Four times a day (QID) | INTRAMUSCULAR | Status: DC

## 2018-10-22 MED ORDER — BUPIVACAINE HCL (PF) 0.5 % IJ SOLN
INTRAMUSCULAR | Status: DC | PRN
  Administered 2018-10-22: 9 mL

## 2018-10-22 MED ORDER — SCOPOLAMINE 1 MG/3DAYS TD PT72
MEDICATED_PATCH | TRANSDERMAL | Status: AC
  Administered 2018-10-22: 1.5 mg via TRANSDERMAL
  Filled 2018-10-22: qty 1

## 2018-10-22 MED ORDER — ONDANSETRON HCL 4 MG/2ML IJ SOLN
INTRAMUSCULAR | Status: AC
  Filled 2018-10-22: qty 2

## 2018-10-22 SURGICAL SUPPLY — 41 items
BLADE SURG SZ11 CARB STEEL (BLADE) ×2 IMPLANT
CANISTER SUCT 1200ML W/VALVE (MISCELLANEOUS) ×2 IMPLANT
CATH ROBINSON RED A/P 16FR (CATHETERS) ×2 IMPLANT
CHLORAPREP W/TINT 26 (MISCELLANEOUS) ×2 IMPLANT
COVER WAND RF STERILE (DRAPES) IMPLANT
DERMABOND ADVANCED (GAUZE/BANDAGES/DRESSINGS) ×1
DERMABOND ADVANCED .7 DNX12 (GAUZE/BANDAGES/DRESSINGS) ×1 IMPLANT
DRSG TEGADERM 2X2.25 PEDS (GAUZE/BANDAGES/DRESSINGS) ×1 IMPLANT
DRSG TELFA 4X3 1S NADH ST (GAUZE/BANDAGES/DRESSINGS) IMPLANT
GLOVE BIO SURGEON STRL SZ8 (GLOVE) ×4 IMPLANT
GLOVE INDICATOR 8.0 STRL GRN (GLOVE) ×2 IMPLANT
GOWN STRL REUS W/ TWL LRG LVL3 (GOWN DISPOSABLE) ×1 IMPLANT
GOWN STRL REUS W/ TWL XL LVL3 (GOWN DISPOSABLE) ×1 IMPLANT
GOWN STRL REUS W/TWL LRG LVL3 (GOWN DISPOSABLE) ×1
GOWN STRL REUS W/TWL XL LVL3 (GOWN DISPOSABLE) ×1
GRASPER SUT TROCAR 14GX15 (MISCELLANEOUS) ×2 IMPLANT
IRRIGATION STRYKERFLOW (MISCELLANEOUS) IMPLANT
IRRIGATOR STRYKERFLOW (MISCELLANEOUS)
IV LACTATED RINGERS 1000ML (IV SOLUTION) IMPLANT
KIT PINK PAD W/HEAD ARE REST (MISCELLANEOUS) ×2
KIT PINK PAD W/HEAD ARM REST (MISCELLANEOUS) ×1 IMPLANT
LABEL OR SOLS (LABEL) ×2 IMPLANT
NEEDLE VERESS 14GA 120MM (NEEDLE) ×2 IMPLANT
NS IRRIG 500ML POUR BTL (IV SOLUTION) ×2 IMPLANT
PACK GYN LAPAROSCOPIC (MISCELLANEOUS) ×2 IMPLANT
PAD PREP 24X41 OB/GYN DISP (PERSONAL CARE ITEMS) ×2 IMPLANT
POUCH SPECIMEN RETRIEVAL 10MM (ENDOMECHANICALS) IMPLANT
SCISSORS METZENBAUM CVD 33 (INSTRUMENTS) ×2 IMPLANT
SET TUBE SMOKE EVAC HIGH FLOW (TUBING) ×2 IMPLANT
SHEARS HARMONIC ACE PLUS 36CM (ENDOMECHANICALS) ×1 IMPLANT
SLEEVE ENDOPATH XCEL 5M (ENDOMECHANICALS) ×1 IMPLANT
SPONGE GAUZE 2X2 8PLY STRL LF (GAUZE/BANDAGES/DRESSINGS) ×1 IMPLANT
STRAP SAFETY 5IN WIDE (MISCELLANEOUS) ×2 IMPLANT
SUT VIC AB 0 CT1 36 (SUTURE) ×3 IMPLANT
SUT VIC AB 2-0 UR6 27 (SUTURE) IMPLANT
SUT VIC AB 4-0 FS2 27 (SUTURE) ×1 IMPLANT
SUT VIC AB 4-0 PS2 18 (SUTURE) IMPLANT
SYR 10ML LL (SYRINGE) ×2 IMPLANT
SYSTEM WECK SHIELD CLOSURE (TROCAR) IMPLANT
TROCAR ENDO BLADELESS 11MM (ENDOMECHANICALS) IMPLANT
TROCAR XCEL NON-BLD 5MMX100MML (ENDOMECHANICALS) ×2 IMPLANT

## 2018-10-22 NOTE — Transfer of Care (Signed)
Immediate Anesthesia Transfer of Care Note  Patient: [name]  Procedure(s) Performed: Procedure(s): LAPAROSCOPIC SALPINGO OOPHORECTOMY, RIGHT (Right)  Patient Location: PACU  Anesthesia Type:General  Level of Consciousness: sedated  Airway & Oxygen Therapy: Patient Spontanous Breathing and Patient connected to face mask oxygen  Post-op Assessment: Report given to RN and Post -op Vital signs reviewed and stable  Post vital signs: Reviewed and stable  Last Vitals:  Vitals:   10/22/18 1359 10/22/18 1754  BP: 119/89 116/68  Pulse: 93 92  Resp: 16 12  Temp: 36.8 C (!) 36.2 C  SpO2: 579% 038%    Complications: No apparent anesthesia complications

## 2018-10-22 NOTE — Interval H&P Note (Signed)
History and Physical Interval Note:  10/22/2018 4:57 PM  [name]  has presented today for surgery, with the diagnosis of R10.31 RLQ PAIN N83.201 OVARIAN CYST, RIGHT.  The various methods of treatment have been discussed with the patient and family. After consideration of risks, benefits and other options for treatment, the patient has consented to  Procedure(s): LAPAROSCOPIC OOPHORECTOMY, RIGHT (Right) as a surgical intervention.  The patient's history has been reviewed, patient examined, no change in status, stable for surgery.  I have reviewed the patient's chart and labs.  Questions were answered to the patient's satisfaction.     Hoyt Koch

## 2018-10-22 NOTE — Discharge Instructions (Signed)
AMBULATORY SURGERY  DISCHARGE INSTRUCTIONS   1) The drugs that you were given will stay in your system until tomorrow so for the next 24 hours you should not:  A) Drive an automobile B) Make any legal decisions C) Drink any alcoholic beverage   2) You may resume regular meals tomorrow.  Today it is better to start with liquids and gradually work up to solid foods.  You may eat anything you prefer, but it is better to start with liquids, then soup and crackers, and gradually work up to solid foods.   3) Please notify your doctor immediately if you have any unusual bleeding, trouble breathing, redness and pain at the surgery site, drainage, fever, or pain not relieved by medication.    4) Additional Instructions:        Please contact your physician with any problems or Same Day Surgery at [phone], Monday through Friday 6 am to 4 pm, or [place] at [facility] Main number at [phone].AMBULATORY SURGERY  DISCHARGE INSTRUCTIONS   5) The drugs that you were given will stay in your system until tomorrow so for the next 24 hours you should not:  D) Drive an automobile E) Make any legal decisions F) Drink any alcoholic beverage   6) You may resume regular meals tomorrow.  Today it is better to start with liquids and gradually work up to solid foods.  You may eat anything you prefer, but it is better to start with liquids, then soup and crackers, and gradually work up to solid foods.   7) Please notify your doctor immediately if you have any unusual bleeding, trouble breathing, redness and pain at the surgery site, drainage, fever, or pain not relieved by medication.    8) Additional Instructions:        Please contact your physician with any problems or Same Day Surgery at [phone], Monday through Friday 6 am to 4 pm, or [place] at [facility] Main number at [phone].AMBULATORY SURGERY  DISCHARGE INSTRUCTIONS   9) The drugs that you were given  will stay in your system until tomorrow so for the next 24 hours you should not:  G) Drive an automobile H) Make any legal decisions I) Drink any alcoholic beverage   10) You may resume regular meals tomorrow.  Today it is better to start with liquids and gradually work up to solid foods.  You may eat anything you prefer, but it is better to start with liquids, then soup and crackers, and gradually work up to solid foods.   11) Please notify your doctor immediately if you have any unusual bleeding, trouble breathing, redness and pain at the surgery site, drainage, fever, or pain not relieved by medication.    12) Additional Instructions:        Please contact your physician with any problems or Same Day Surgery at [phone], Monday through Friday 6 am to 4 pm, or  at [facility] Main number at [phone].AMBULATORY SURGERY  DISCHARGE INSTRUCTIONS   13) The drugs that you were given will stay in your system until tomorrow so for the next 24 hours you should not:  J) Drive an automobile K) Make any legal decisions L) Drink any alcoholic beverage   14) You may resume regular meals tomorrow.  Today it is better to start with liquids and gradually work up to solid foods.  You may eat anything you prefer, but it is better to start with liquids, then soup and crackers, and gradually  work up to [employer].   15) Please notify your doctor immediately if you have any unusual bleeding, trouble breathing, redness and pain at the surgery site, drainage, fever, or pain not relieved by medication.    16) Additional Instructions:        Please contact your physician with any problems or Same Day Surgery at [phone], Monday through Friday 6 am to 4 pm, or [place] at [facility] number at [phone].Unilateral Salpingo-Oophorectomy, Care After This sheet gives you information about how to care for yourself after your procedure. Your health care provider  may also give you more specific instructions. If you have problems or questions, contact your health care provider. What can I expect after the procedure? After the procedure, it is common to have:  Abdominal pain.  Some occasional vaginal bleeding (spotting).  Tiredness. Follow these instructions at home: Incision care   Keep your incision area and your bandage (dressing) clean and dry.  Follow instructions from your health care provider about how to take care of your incision. Make sure you: ? Wash your hands with soap and water before you change your dressing. If soap and water are not available, use hand sanitizer. ? Change your dressing as told by your health care provider. ? Leave stitches (sutures), staples, skin glue, or adhesive strips in place. These skin closures may need to stay in place for 2 weeks or longer. If adhesive strip edges start to loosen and curl up, you may trim the loose edges. Do not remove adhesive strips completely unless your health care provider tells you to do that.  Check your incision area every day for signs of infection. Check for: ? Redness, swelling, or pain. ? Fluid or blood. ? Warmth. ? Pus or a bad smell. Activity  Do not drive or use heavy machinery while taking prescription pain medicine.  Do not drive for 24 hours if you received a medicine to help you relax (sedative).  Take frequent, short walks throughout the day. Rest when you get tired. Ask your health care provider what activities are safe for you.  Avoid activities that require great effort. Also, avoid heavy lifting. Do not lift anything that is heavier than 5 lb (2.3 kg), or the limit that your health care provider tells you, until he or she says that it is safe to do so.  Do not douche, use tampons, or have sex until your health care provider approves. General instructions  To prevent or treat constipation while you are taking prescription pain medicine, your health care  provider may recommend that you: ? Drink enough fluid to keep your urine pale yellow. ? Take over-the-counter or prescription medicines. ? Eat foods that are high in fiber, such as fresh fruits and vegetables, whole grains, and beans. ? Limit foods that are high in fat and processed sugars, such as fried and sweet foods.  Take over-the-counter and prescription medicines only as told by your health care provider.  Do not take baths, swim, or use a hot tub until your health care provider approves. Ask your health care provider if you may take showers. You may only be allowed to take sponge baths.  Wear compression stockings as told by your health care provider. These stockings help to prevent blood clots and reduce swelling in your legs.  Keep all follow-up visits as told by your health care provider. This is important. Contact a health care provider if:  You have pain when you urinate.  You  have pus or a bad smelling discharge coming from your vagina.  You have redness, swelling, or pain around your incision.  You have fluid or blood coming from your incision.  Your incision feels warm to the touch.  You have pus or a bad smell coming from your incision.  You have a fever.  Your incision starts to break open.  You have abdominal pain that gets worse or does not get better with medicine.  You develop a rash.  You develop nausea and vomiting.  You feel lightheaded. Get help right away if:  You develop pain in your chest or leg.  You develop shortness of breath.  You faint.  You have increased bleeding from your vagina. Summary  After the procedure, it is common to have pain, tiredness, and occasional bleeding from the vagina.  Follow instructions from your health care provider about how to take care of your incision.  Check your incision every day for signs of infection and report any symptoms to your health care provider.  Follow instructions from your health care  provider about activities and restrictions. This information is not intended to replace advice given to you by your health care provider. Make sure you discuss any questions you have with your health care provider. Document Released: 12/08/2008 Document Revised: 01/24/2017 Document Reviewed: 05/23/2016 Elsevier Patient Education  [place], Zofran Rx sent to [facility]

## 2018-10-22 NOTE — Anesthesia Procedure Notes (Signed)

## 2018-10-22 NOTE — OR Nursing (Signed)
Clarified allergies and order for scopolamine with Dr Ronelle Nigh, patient may have patch per MD.  Patient reports using Scopolamine in past without any issues.

## 2018-10-22 NOTE — Anesthesia Preprocedure Evaluation (Addendum)
Anesthesia Evaluation  Patient identified by MRN, date of birth, ID band Patient awake    Reviewed: Allergy & Precautions, H&P , NPO status , Patient's Chart, lab work & pertinent test results  History of Anesthesia Complications (+) PONV and history of anesthetic complications  Airway Mallampati: I  TM Distance: >3 FB Neck ROM: full    Dental  (+) Teeth Intact   Pulmonary asthma , former smoker,           Cardiovascular negative cardio ROS       Neuro/Psych  Headaches, PSYCHIATRIC DISORDERS Anxiety    GI/Hepatic Neg liver ROS, GERD  Controlled,  Endo/Other  negative endocrine ROS  Renal/GU stones     Musculoskeletal   Abdominal   Peds  Hematology negative hematology ROS (+)   Anesthesia Other Findings Obese  Past Medical History: 06/29/2012: Acute pyelonephritis No date: Anxiety No date: Arthritis No date: Asthma     Comment:  AS A CHILD No date: Complication of anesthesia     Comment:  TROUBLE URINATING IN THE PAST No date: Gallstones No date: GERD (gastroesophageal reflux disease) No date: History of kidney stones No date: Kidney stones     Comment:  bilateral No date: Migraine No date: Pelvic pain in female No date: PONV (postoperative nausea and vomiting)     Comment:  WITH HYSTERECTOMY ONLY 2015: Pyelonephritis 2017: Renal mass     Comment:  right. considered a normal cyst on kidney. No date: Tobacco abuse  Past Surgical History: 2006: ABDOMINAL HYSTERECTOMY 2017: ANKLE ARTHROSCOPY; Left 11/24/2015: ANKLE ARTHROSCOPY; Left 07/26/13: APPENDECTOMY 46/96/2952: CAST APPLICATION     Comment:  Procedure: CAST APPLICATION;  Surgeon: Johnn Hai,               MD;  Location: [facility];  Service: Orthopedics;                Laterality: Left; 03/11/2011: CHOLECYSTECTOMY     Comment:  Procedure: LAPAROSCOPIC CHOLECYSTECTOMY WITH               INTRAOPERATIVE CHOLANGIOGRAM;  Surgeon: Gayland Curry,              MD;  Location: [facility];  Service: General;  Laterality:               N/A; No date: CHOLECYSTECTOMY, LAPAROSCOPIC 06/18/2016: CYSTOSCOPY/URETEROSCOPY/HOLMIUM LASER/STENT PLACEMENT; Left     Comment:  Procedure: CYSTOSCOPY/URETEROSCOPY/HOLMIUM LASER/STENT               PLACEMENT;  Surgeon: Hollice Espy, MD;  Location: [facility]               ORS;  Service: Urology;  Laterality: Left; 09/02/2017: LAPAROSCOPIC OVARIAN CYSTECTOMY; Right     Comment:  Procedure: LAPAROSCOPIC OVARIAN CYSTECTOMY;  Surgeon:               Gae Dry, MD;  Location: [facility] ORS;  Service:               Gynecology;  Laterality: Right; No date: LITHOTRIPSY     Comment:  x 4 12/25/2011: ORIF ANKLE FRACTURE     Comment:  Procedure: OPEN REDUCTION INTERNAL FIXATION (ORIF) ANKLE              FRACTURE;  Surgeon: Johnn Hai, MD;  Location: [facility]               ORS;  Service: Orthopedics;  Laterality: Left; No date: TONSILLECTOMY  BMI  Body Mass Index: 30.73 kg/m      Reproductive/Obstetrics negative OB ROS                           Anesthesia Physical Anesthesia Plan  ASA: II  Anesthesia Plan: General ETT   Post-op Pain Management:    Induction:   PONV Risk Score and Plan: Ondansetron, Dexamethasone, Midazolam and Treatment may vary due to age or medical condition  Airway Management Planned:   Additional Equipment:   Intra-op Plan:   Post-operative Plan:   Informed Consent: I have reviewed the patients History and Physical, chart, labs and discussed the procedure including the risks, benefits and alternatives for the proposed anesthesia with the patient or authorized representative who has indicated his/her understanding and acceptance.     Dental Advisory Given  Plan Discussed with: Anesthesiologist and CRNA  Anesthesia Plan Comments:         Anesthesia Quick Evaluation

## 2018-10-22 NOTE — Anesthesia Post-op Follow-up Note (Signed)
Anesthesia QCDR form completed.        

## 2018-10-22 NOTE — Op Note (Signed)
Operative Note:  PRE-OP DIAGNOSIS: R10.31 RLQ PAIN N83.201 OVARIAN CYST, RIGHT   POST-OP DIAGNOSIS: R10.31 RLQ PAIN N83.201 OVARIAN CYST, RIGHT   PROCEDURE: Procedure(s): LAPAROSCOPIC SALPINGO OOPHORECTOMY, RIGHT  SURGEON: Barnett Applebaum, MD, FACOG  ANESTHESIA: General endotracheal anesthesia  ESTIMATED BLOOD LOSS: Minimal  SPECIMENS: Right Tube and Ovary.  COMPLICATIONS: None  DISPOSITION: stable to PACU  FINDINGS: Intraabdominal adhesions were minimal. Prior hysterectomy. Left ovary normal.  PROCEDURE IN DETAIL: The patient was taken to the OR where anesthesia was administed. The patient was positioned supine. Foley placed.  Attention was turned to the patient's abdomen where a 5 mm skin incision was made in the umbilical fold, after injection of local anesthesia. The Veress step needle was carefully introduced into the peritoneal cavity with placement confirmed using the hanging drop technique. Pneumoperitoneum was obtained. The 5 mm port was then placed under direct visualization with the operative laparoscope The above noted findings. Trendelenburg.  11 mm trocar were then placed in the suprapubic region under direct visualization with the laparoscope. Right fallopian tube/ovary are identified and the ovarian blood vessels/infundibulopelvic ligament pedicles are coagulated and ligated using the Harmonic scapel with complete transection for amputation of this adnexa.They are removed through a specimen bag. No injuries or bleeding was noted.  All instruments and ports were then removed from the abdomen after gas was expelled and patient was leveled. The skin was closed with skin adhesive. The foley catheter was removed. The patient tolerated the procedure well. All counts were correct x 2. The patient was transferred to the recovery room awake, alert and breathing independently.  Barnett Applebaum, MD, [facility], [facility] 10/22/2018  5:50 PM

## 2018-10-22 NOTE — Anesthesia Postprocedure Evaluation (Signed)
Anesthesia Post Note  Patient: [name]  Procedure(s) Performed: LAPAROSCOPIC SALPINGO OOPHORECTOMY, RIGHT (Right Abdomen)  Patient location during evaluation: PACU Anesthesia Type: General Level of consciousness: awake and alert Pain management: pain level controlled Vital Signs Assessment: post-procedure vital signs reviewed and stable Respiratory status: spontaneous breathing, nonlabored ventilation, respiratory function stable and patient connected to nasal cannula oxygen Cardiovascular status: blood pressure returned to baseline and stable Postop Assessment: no apparent nausea or vomiting Anesthetic complications: no     Last Vitals:  Vitals:   10/22/18 1854 10/22/18 1908  BP: 111/77 112/71  Pulse: 63 (!) 58  Resp: 14 19  Temp: (!) 36.3 C   SpO2: 99% 97%    Last Pain:  Vitals:   10/22/18 1908  TempSrc:   PainSc: 5                  Precious Haws Morry Veiga

## 2018-10-23 ENCOUNTER — Telehealth: Payer: Self-pay

## 2018-10-23 NOTE — Telephone Encounter (Signed)
Pt aware to reach back out to [place] if symptoms do not go away

## 2018-10-23 NOTE — Telephone Encounter (Signed)
Pt called triage this morning and reports having bladder spasms and dysuria, is this normal after her surgery?

## 2018-10-23 NOTE — Telephone Encounter (Signed)
Let her know this is probably related to surgery yesterday. No other bladder concerns encountered yesterday.

## 2018-10-26 LAB — SURGICAL PATHOLOGY

## 2018-10-28 ENCOUNTER — Encounter: Payer: Self-pay | Admitting: Obstetrics & Gynecology

## 2018-10-28 ENCOUNTER — Other Ambulatory Visit: Payer: Self-pay

## 2018-10-28 ENCOUNTER — Ambulatory Visit (INDEPENDENT_AMBULATORY_CARE_PROVIDER_SITE_OTHER): Payer: BC Managed Care – PPO | Admitting: Obstetrics & Gynecology

## 2018-10-28 VITALS — BP 130/80 | Ht 67.0 in | Wt 200.0 lb

## 2018-10-28 DIAGNOSIS — R300 Dysuria: Secondary | ICD-10-CM

## 2018-10-28 DIAGNOSIS — Z9889 Other specified postprocedural states: Secondary | ICD-10-CM

## 2018-10-28 DIAGNOSIS — N83201 Unspecified ovarian cyst, right side: Secondary | ICD-10-CM

## 2018-10-28 LAB — POCT URINALYSIS DIPSTICK
Bilirubin, UA: NEGATIVE
Blood, UA: POSITIVE
Glucose, UA: NEGATIVE
Ketones, UA: NEGATIVE
Leukocytes, UA: NEGATIVE
Nitrite, UA: NEGATIVE
Protein, UA: NEGATIVE
Spec Grav, UA: 1.01 (ref 1.010–1.025)
Urobilinogen, UA: 0.2 E.U./dL
pH, UA: 5 (ref 5.0–8.0)

## 2018-10-28 NOTE — Progress Notes (Signed)
  Postoperative Follow-up Patient presents post op from right oophorectomy and laparoscopy for pelvic pain and recurrent cysts, 1 week ago. Path: DIAGNOSIS:  A. FALLOPIAN TUBE AND OVARY, RIGHT; SALPINGO-OOPHORECTOMY:  - OVARY WITH BENIGN CYSTIC FOLLICLES.  - FALLOPIAN TUBE WITH PARATUBAL CYST.  - NEGATIVE FOR ATYPIA AND MALIGNANCY.  Subjective: Patient reports marked improvement in her preop symptoms. Eating a regular diet without difficulty. The patient is not having any pain.  Activity: normal activities of daily living. Patient reports additional symptom's since surgery of None.  Objective: BP 130/80   Ht 5\' 7"  (1.702 m)   Wt 200 lb (90.7 kg)   BMI 31.32 kg/m  Physical Exam Constitutional:      General: She is not in acute distress.    Appearance: She is well-developed.  Cardiovascular:     Rate and Rhythm: Normal rate.  Pulmonary:     Effort: Pulmonary effort is normal.  Abdominal:     General: There is no distension.     Palpations: Abdomen is soft.     Tenderness: There is no abdominal tenderness.     Comments: Incision Healing Well   Musculoskeletal: Normal range of motion.  Neurological:     Mental Status: She is alert and oriented to person, place, and time.     Cranial Nerves: No cranial nerve deficit.  Skin:    General: Skin is warm and dry.   Bruising noted at suprapubic incision  Assessment: s/p :  right oophorectomy and laparoscopy progressing well  Plan: Patient has done well after surgery with no apparent complications.  I have discussed the post-operative course to date, and the expected progress moving forward.  The patient understands what complications to be concerned about.  I will see the patient in routine follow up, or sooner if needed.    Activity plan: No restriction.  Hoyt Koch 10/28/2018, 2:06 PM

## 2018-11-02 ENCOUNTER — Other Ambulatory Visit: Payer: Self-pay | Admitting: Physician Assistant

## 2018-11-02 DIAGNOSIS — R110 Nausea: Secondary | ICD-10-CM

## 2018-11-03 MED ORDER — PROMETHAZINE HCL 25 MG PO TABS: 25.0000 mg | ORAL_TABLET | Freq: Three times a day (TID) | ORAL | 0 refills | Status: DC | PRN

## 2018-11-04 DIAGNOSIS — M542 Cervicalgia: Secondary | ICD-10-CM | POA: Diagnosis not present

## 2018-11-04 DIAGNOSIS — G43719 Chronic migraine without aura, intractable, without status migrainosus: Secondary | ICD-10-CM | POA: Diagnosis not present

## 2018-11-19 DIAGNOSIS — M47817 Spondylosis without myelopathy or radiculopathy, lumbosacral region: Secondary | ICD-10-CM | POA: Diagnosis not present

## 2018-11-19 DIAGNOSIS — M5416 Radiculopathy, lumbar region: Secondary | ICD-10-CM | POA: Diagnosis not present

## 2018-11-24 ENCOUNTER — Other Ambulatory Visit: Payer: Self-pay | Admitting: Physician Assistant

## 2018-11-24 DIAGNOSIS — G2581 Restless legs syndrome: Secondary | ICD-10-CM

## 2018-11-25 ENCOUNTER — Encounter: Payer: Self-pay | Admitting: Physician Assistant

## 2018-11-25 ENCOUNTER — Ambulatory Visit (INDEPENDENT_AMBULATORY_CARE_PROVIDER_SITE_OTHER): Payer: BC Managed Care – PPO | Admitting: Physician Assistant

## 2018-11-25 ENCOUNTER — Other Ambulatory Visit: Payer: Self-pay

## 2018-11-25 VITALS — BP 112/81 | HR 79 | Temp 97.5°F | Resp 16 | Ht 67.0 in | Wt 202.0 lb

## 2018-11-25 DIAGNOSIS — E559 Vitamin D deficiency, unspecified: Secondary | ICD-10-CM | POA: Diagnosis not present

## 2018-11-25 DIAGNOSIS — F411 Generalized anxiety disorder: Secondary | ICD-10-CM

## 2018-11-25 DIAGNOSIS — M5441 Lumbago with sciatica, right side: Secondary | ICD-10-CM

## 2018-11-25 DIAGNOSIS — N83201 Unspecified ovarian cyst, right side: Secondary | ICD-10-CM | POA: Diagnosis not present

## 2018-11-25 DIAGNOSIS — G8929 Other chronic pain: Secondary | ICD-10-CM

## 2018-11-25 DIAGNOSIS — Z0000 Encounter for general adult medical examination without abnormal findings: Secondary | ICD-10-CM | POA: Diagnosis not present

## 2018-11-25 DIAGNOSIS — F5101 Primary insomnia: Secondary | ICD-10-CM | POA: Diagnosis not present

## 2018-11-25 DIAGNOSIS — M5442 Lumbago with sciatica, left side: Secondary | ICD-10-CM

## 2018-11-25 MED ORDER — ALPRAZOLAM 1 MG PO TABS: ORAL_TABLET | ORAL | 5 refills | Status: DC

## 2018-11-25 NOTE — Patient Instructions (Signed)
Health Maintenance, Female Adopting a healthy lifestyle and getting preventive care are important in promoting health and wellness. Ask your health care provider about:  The right schedule for you to have regular tests and exams.  Things you can do on your own to prevent diseases and keep yourself healthy. What should I know about diet, weight, and exercise? Eat a healthy diet   Eat a diet that includes plenty of vegetables, fruits, low-fat dairy products, and lean protein.  Do not eat a lot of foods that are high in solid fats, added sugars, or sodium. Maintain a healthy weight Body mass index (BMI) is used to identify weight problems. It estimates body fat based on height and weight. Your health care provider can help determine your BMI and help you achieve or maintain a healthy weight. Get regular exercise Get regular exercise. This is one of the most important things you can do for your health. Most adults should:  Exercise for at least 150 minutes each week. The exercise should increase your heart rate and make you sweat (moderate-intensity exercise).  Do strengthening exercises at least twice a week. This is in addition to the moderate-intensity exercise.  Spend less time sitting. Even light physical activity can be beneficial. Watch cholesterol and blood lipids Have your blood tested for lipids and cholesterol at 40 years of age, then have this test every 5 years. Have your cholesterol levels checked more often if:  Your lipid or cholesterol levels are high.  You are older than 40 years of age.  You are at high risk for heart disease. What should I know about cancer screening? Depending on your health history and family history, you may need to have cancer screening at various ages. This may include screening for:  Breast cancer.  Cervical cancer.  Colorectal cancer.  Skin cancer.  Lung cancer. What should I know about heart disease, diabetes, and high blood  pressure? Blood pressure and heart disease  High blood pressure causes heart disease and increases the risk of stroke. This is more likely to develop in people who have high blood pressure readings, are of African descent, or are overweight.  Have your blood pressure checked: ? Every 3-5 years if you are 18-39 years of age. ? Every year if you are 40 years old or older. Diabetes Have regular diabetes screenings. This checks your fasting blood sugar level. Have the screening done:  Once every three years after age 40 if you are at a normal weight and have a low risk for diabetes.  More often and at a younger age if you are overweight or have a high risk for diabetes. What should I know about preventing infection? Hepatitis B If you have a higher risk for hepatitis B, you should be screened for this virus. Talk with your health care provider to find out if you are at risk for hepatitis B infection. Hepatitis C Testing is recommended for:  Everyone born from 1945 through 1965.  Anyone with known risk factors for hepatitis C. Sexually transmitted infections (STIs)  Get screened for STIs, including gonorrhea and chlamydia, if: ? You are sexually active and are younger than 40 years of age. ? You are older than 40 years of age and your health care provider tells you that you are at risk for this type of infection. ? Your sexual activity has changed since you were last screened, and you are at increased risk for chlamydia or gonorrhea. Ask your health care provider if   you are at risk.  Ask your health care provider about whether you are at high risk for HIV. Your health care provider may recommend a prescription medicine to help prevent HIV infection. If you choose to take medicine to prevent HIV, you should first get tested for HIV. You should then be tested every 3 months for as long as you are taking the medicine. Pregnancy  If you are about to stop having your period (premenopausal) and  you may become pregnant, seek counseling before you get pregnant.  Take 400 to 800 micrograms (mcg) of folic acid every day if you become pregnant.  Ask for birth control (contraception) if you want to prevent pregnancy. Osteoporosis and menopause Osteoporosis is a disease in which the bones lose minerals and strength with aging. This can result in bone fractures. If you are 65 years old or older, or if you are at risk for osteoporosis and fractures, ask your health care provider if you should:  Be screened for bone loss.  Take a calcium or vitamin D supplement to lower your risk of fractures.  Be given hormone replacement therapy (HRT) to treat symptoms of menopause. Follow these instructions at home: Lifestyle  Do not use any products that contain nicotine or tobacco, such as cigarettes, e-cigarettes, and chewing tobacco. If you need help quitting, ask your health care provider.  Do not use street drugs.  Do not share needles.  Ask your health care provider for help if you need support or information about quitting drugs. Alcohol use  Do not drink alcohol if: ? Your health care provider tells you not to drink. ? You are pregnant, may be pregnant, or are planning to become pregnant.  If you drink alcohol: ? Limit how much you use to 0-1 drink a day. ? Limit intake if you are breastfeeding.  Be aware of how much alcohol is in your drink. In the [place] one drink equals one 12 oz bottle of beer (355 mL), one 5 oz glass of wine (148 mL), or one 1 oz glass of hard liquor (44 mL). General instructions  Schedule regular health, dental, and eye exams.  Stay current with your vaccines.  Tell your health care provider if: ? You often feel depressed. ? You have ever been abused or do not feel safe at home. Summary  Adopting a healthy lifestyle and getting preventive care are important in promoting health and wellness.  Follow your health care provider's instructions about healthy  diet, exercising, and getting tested or screened for diseases.  Follow your health care provider's instructions on monitoring your cholesterol and blood pressure. This information is not intended to replace advice given to you by your health care provider. Make sure you discuss any questions you have with your health care provider. Document Released: 08/27/2010 Document Revised: 02/04/2018 Document Reviewed: 02/04/2018 Elsevier Patient Education  2020 Elsevier Inc.  

## 2018-11-25 NOTE — Progress Notes (Signed)
Patient: [name], Female    DOB: May 19, 1980, 38 y.o.   MRN: [id number] Visit Date: 11/25/2018  Today's Provider: Mar Daring, PA-C   Chief Complaint  Patient presents with  . Annual Exam   Subjective:     Annual physical exam [name] is a 38 y.o. female who presents today for health maintenance and complete physical. She feels well. She reports exercising. She reports she is sleeping fairly well.  She also has been seeing [facility] and they have a pain psychiatrist on staff. They have her on cymbalta and other pain management for her back. She reports she has been doing so much better and is actually exercising again and feels like herself. They are considering doing the radioablation to her lumbar nerve roots to see if that will help her pain. She also mentions possible hypermobility issues. They have mentioned it but not confirmed the diagnosis. They have only addressed once. She reports she will discuss further to see if she wants to pursue further work up.  -----------------------------------------------------------------   Review of Systems  Constitutional: Negative.   HENT: Negative.   Eyes: Negative.   Respiratory: Negative.   Cardiovascular: Negative.   Gastrointestinal: Positive for constipation.  Endocrine: Negative.   Genitourinary: Negative.   Musculoskeletal: Positive for arthralgias, back pain and myalgias.  Skin: Negative.   Allergic/Immunologic: Negative.   Neurological: Positive for headaches.  Hematological: Bruises/bleeds easily.  Psychiatric/Behavioral: The patient is nervous/anxious.     Social History      She  reports that she quit smoking about 4 years ago. Her smoking use included cigarettes. She started smoking about 19 years ago. She has a 7.50 pack-year smoking history. She has never used smokeless tobacco. She reports current alcohol use. She reports that she does not use drugs.       Social History    Socioeconomic History  . Marital status: Married    Spouse name: Mia Creek  . Number of children: Not on file  . Years of education: Not on file  . Highest education level: Not on file  Occupational History  . Not on file  Social Needs  . Financial resource strain: Not on file  . Food insecurity    Worry: Not on file    Inability: Not on file  . Transportation needs    Medical: Not on file    Non-medical: Not on file  Tobacco Use  . Smoking status: Former Smoker    Packs/day: 0.50    Years: 15.00    Pack years: 7.50    Types: Cigarettes    Start date: 11/03/1999    Quit date: 09/26/2014    Years since quitting: 4.1  . Smokeless tobacco: Never Used  Substance and Sexual Activity  . Alcohol use: Yes    Alcohol/week: 0.0 standard drinks    Comment: social-rare  . Drug use: No  . Sexual activity: Yes    Birth control/protection: Surgical    Comment: Hysterectomy   Lifestyle  . Physical activity    Days per week: Not on file    Minutes per session: Not on file  . Stress: Not on file  Relationships  . Social Herbalist on phone: Not on file    Gets together: Not on file    Attends religious service: Not on file    Active member of club or organization: Not on file    Attends meetings of clubs or  organizations: Not on file    Relationship status: Not on file  Other Topics Concern  . Not on file  Social History Narrative  . Not on file    Past Medical History:  Diagnosis Date  . Acute pyelonephritis 06/29/2012  . Anxiety   . Arthritis   . Asthma    AS A CHILD  . Complication of anesthesia    TROUBLE URINATING IN THE PAST  . Gallstones   . GERD (gastroesophageal reflux disease)   . History of kidney stones   . Kidney stones    bilateral  . Migraine   . Pelvic pain in female   . PONV (postoperative nausea and vomiting)    WITH HYSTERECTOMY ONLY  . Pyelonephritis 2015  . Renal mass 2017   right. considered a normal cyst on kidney.  . Tobacco abuse       Patient Active Problem List   Diagnosis Date Noted  . Endometriosis 08/27/2017  . Pelvic pain 08/27/2017  . IBS (irritable bowel syndrome) 02/02/2015  . Herpes simplex 02/02/2015  . Asthma, mild intermittent 11/03/2014  . Migraine without aura or status migrainosus 11/03/2014  . Gastroesophageal reflux disease without esophagitis 11/03/2014  . Generalized anxiety disorder 11/03/2014  . Cyst of ovary 08/06/2013  . Appendicolith 07/16/2013  . Chronic constipation 06/30/2012  . History of pyelonephritis 06/29/2012  . Nephrolithiasis 06/28/2012    Past Surgical History:  Procedure Laterality Date  . ABDOMINAL HYSTERECTOMY  2006  . ANKLE ARTHROSCOPY Left 2017  . ANKLE ARTHROSCOPY Left 11/24/2015  . APPENDECTOMY  07/26/13  . CAST APPLICATION  16/11/9602   Procedure: CAST APPLICATION;  Surgeon: Johnn Hai, MD;  Location: [facility];  Service: Orthopedics;  Laterality: Left;  . CHOLECYSTECTOMY  03/11/2011   Procedure: LAPAROSCOPIC CHOLECYSTECTOMY WITH INTRAOPERATIVE CHOLANGIOGRAM;  Surgeon: Gayland Curry, MD;  Location: [facility];  Service: General;  Laterality: N/A;  . CHOLECYSTECTOMY, LAPAROSCOPIC    . CYSTOSCOPY/URETEROSCOPY/HOLMIUM LASER/STENT PLACEMENT Left 06/18/2016   Procedure: CYSTOSCOPY/URETEROSCOPY/HOLMIUM LASER/STENT PLACEMENT;  Surgeon: Hollice Espy, MD;  Location: [facility];  Service: Urology;  Laterality: Left;  . LAPAROSCOPIC OVARIAN CYSTECTOMY Right 09/02/2017   Procedure: LAPAROSCOPIC OVARIAN CYSTECTOMY;  Surgeon: Gae Dry, MD;  Location: [facility];  Service: Gynecology;  Laterality: Right;  . LITHOTRIPSY     x 4  . ORIF ANKLE FRACTURE  12/25/2011   Procedure: OPEN REDUCTION INTERNAL FIXATION (ORIF) ANKLE FRACTURE;  Surgeon: Johnn Hai, MD;  Location: [facility];  Service: Orthopedics;  Laterality: Left;  . TONSILLECTOMY      Family History        Family Status  Relation Name Status  . Mother  Deceased  . Father  Deceased  . Sister  Alive  . Brother  Alive   . Brother  Alive  . Brother  Alive  . Neg Hx  (Not Specified)        Her family history includes Diabetes in her father and mother; Heart failure in her father; Hypertension in her father; Nephrolithiasis in her father; Pulmonary fibrosis in her father. There is no history of Kidney cancer or Bladder Cancer.      Allergies  Allergen Reactions  . Compazine Other (See Comments)    Dystonia  . Metoclopramide Other (See Comments)    dystonia     Current Outpatient Medications:  .  ALPRAZolam (XANAX) 1 MG tablet, TAKE 1/2 TO 1 TABLET BY MOUTH 3 TIMES A DAY AS NEEDED FOR ANXIETY (Patient taking differently: Take 1  mg by mouth at bedtime. ), Disp: 90 tablet, Rfl: 5 .  baclofen (LIORESAL) 10 MG tablet, Take 10 mg by mouth 2 (two) times daily as needed (migraines). , Disp: , Rfl: 0 .  chlorproMAZINE (THORAZINE) 25 MG tablet, Take 25 mg by mouth every 4 (four) hours as needed (migraines). , Disp: , Rfl:  .  Cholecalciferol (VITAMIN D3) 5000 units CAPS, Take 5,000 Units by mouth daily., Disp: , Rfl:  .  DULoxetine (CYMBALTA) 20 MG capsule, Take 20 mg by mouth at bedtime. , Disp: , Rfl:  .  gabapentin (NEURONTIN) 400 MG capsule, TAKE 1 CAPSULE BY MOUTH TWICE DAILY, Disp: 180 capsule, Rfl: 1 .  HYDROcodone-acetaminophen (NORCO) 10-325 MG tablet, Take 0.5-1 tablets by mouth every 6 (six) hours as needed for severe pain. Max 2 tabs per day, Disp: , Rfl:  .  meloxicam (MOBIC) 7.5 MG tablet, Take 7.5 mg by mouth daily., Disp: , Rfl:  .  metroNIDAZOLE (METROGEL) 0.75 % vaginal gel, Use one applicator intravaginally after sexual intercourse (Patient taking differently: Place 1 Applicatorful vaginally daily as needed (after sexual intercourse). ), Disp: 70 g, Rfl: 3 .  Multiple Vitamin (MULTIVITAMIN WITH MINERALS) TABS tablet, Take 1 tablet by mouth daily., Disp: , Rfl:  .  naloxone (NARCAN) 4 MG/0.1ML LIQD nasal spray kit, Place 1 spray into the nose as needed (opioid overdose)., Disp: , Rfl:  .   omeprazole (PRILOSEC) 20 MG capsule, TAKE 1 CAPSULE BY MOUTH ONCE DAILY (Patient taking differently: Take 20 mg by mouth at bedtime. ), Disp: 90 capsule, Rfl: 1 .  OnabotulinumtoxinA (BOTOX IJ), Inject 1 each as directed every 3 (three) months. Alternating with trigger point inj, Disp: , Rfl:  .  polyethylene glycol (MIRALAX / GLYCOLAX) packet, Take 17 g by mouth every other day. , Disp: , Rfl:  .  promethazine (PHENERGAN) 25 MG tablet, Take 1 tablet (25 mg total) by mouth every 8 (eight) hours as needed for nausea or vomiting., Disp: 30 tablet, Rfl: 0 .  valACYclovir (VALTREX) 500 MG tablet, Take 2 tablets (1,000 mg total) by mouth 2 (two) times daily as needed (cold sores)., Disp: 20 tablet, Rfl: 5 .  ondansetron (ZOFRAN ODT) 4 MG disintegrating tablet, Take 1 tablet (4 mg total) by mouth every 6 (six) hours as needed for nausea. (Patient not taking: Reported on 11/25/2018), Disp: 20 tablet, Rfl: 0   Patient Care Team: Mar Daring, PA-C as PCP - General (Family Medicine) Steele Sizer, MD as Attending Physician (Family Medicine) Robert Bellow, MD (General Surgery)    Objective:    Vitals: BP 112/81 (BP Location: Left Arm, Patient Position: Sitting, Cuff Size: Large)   Pulse 79   Temp (!) 97.5 F (36.4 C) (Other (Comment))   Resp 16   Ht _0  (1.702 m)   Wt 202 lb (91.6 kg)   BMI 31.64 kg/m    Vitals:   11/25/18 1333  BP: 112/81  Pulse: 79  Resp: 16  Temp: (!) 97.5 F (36.4 C)  TempSrc: Other (Comment)  Weight: 202 lb (91.6 kg)  Height: _1  (1.702 m)     Physical Exam Vitals signs reviewed.  Constitutional:      General: She is not in acute distress.    Appearance: Normal appearance. She is well-developed. She is obese. She is not diaphoretic.  HENT:     Head: Normocephalic and atraumatic.     Right Ear: Tympanic membrane, ear canal and external ear normal.  Left Ear: Tympanic membrane, ear canal and external ear normal.     Nose: Nose normal.      Mouth/Throat:     Mouth: Mucous membranes are moist.     Pharynx: Oropharynx is clear. No oropharyngeal exudate.  Eyes:     General: No scleral icterus.       Right eye: No discharge.        Left eye: No discharge.     Extraocular Movements: Extraocular movements intact.     Conjunctiva/sclera: Conjunctivae normal.     Pupils: Pupils are equal, round, and reactive to light.  Neck:     Musculoskeletal: Normal range of motion and neck supple.     Thyroid: No thyromegaly.     Vascular: No JVD.     Trachea: No tracheal deviation.  Cardiovascular:     Rate and Rhythm: Normal rate and regular rhythm.     Pulses: Normal pulses.     Heart sounds: Normal heart sounds. No murmur. No friction rub. No gallop.   Pulmonary:     Effort: Pulmonary effort is normal. No respiratory distress.     Breath sounds: Normal breath sounds. No wheezing or rales.  Chest:     Chest wall: No tenderness.  Abdominal:     General: Abdomen is flat. Bowel sounds are normal. There is no distension.     Palpations: Abdomen is soft. There is no mass.     Tenderness: There is no abdominal tenderness. There is no guarding or rebound.  Musculoskeletal: Normal range of motion.        General: No tenderness.     Right lower leg: No edema.     Left lower leg: No edema.  Lymphadenopathy:     Cervical: No cervical adenopathy.  Skin:    General: Skin is warm and dry.     Capillary Refill: Capillary refill takes less than 2 seconds.     Findings: No rash.  Neurological:     General: No focal deficit present.     Mental Status: She is alert and oriented to person, place, and time. Mental status is at baseline.     Cranial Nerves: No cranial nerve deficit.     Motor: No weakness.     Gait: Gait normal.  Psychiatric:        Mood and Affect: Mood normal.        Behavior: Behavior normal.        Thought Content: Thought content normal.        Judgment: Judgment normal.      Depression Screen PHQ 2/9 Scores  11/25/2018 11/20/2017 08/15/2016 02/02/2015  PHQ - 2 Score 0 1 0 0  PHQ- 9 Score - 4 2 -       Assessment & Plan:     Routine Health Maintenance and Physical Exam  Exercise Activities and Dietary recommendations Goals   None     Immunization History  Administered Date(s) Administered  . Influenza-Unspecified 01/03/2015  . Tdap 03/22/2011    Health Maintenance  Topic Date Due  . INFLUENZA VACCINE  05/26/2019 (Originally 09/26/2018)  . TETANUS/TDAP  03/21/2021  . HIV Screening  Completed  . PAP SMEAR-Modifier  Discontinued     Discussed health benefits of physical activity, and encouraged her to engage in regular exercise appropriate for her age and condition.    1. Annual physical exam Normal physical exam today. Will check labs as below and f/u pending lab results. If labs are stable and  WNL she will not need to have these rechecked for one year at her next annual physical exam. She is to call the office in the meantime if she has any acute issue, questions or concerns. - CBC with Differential/Platelet - Comprehensive metabolic panel - Hemoglobin A1c - Lipid panel - TSH  2. Primary insomnia Stable. Diagnosis pulled for medication refill. Continue current medical treatment plan. - ALPRAZolam (XANAX) 1 MG tablet; TAKE 1/2 TO 1 TABLET BY MOUTH 2 TIMES A DAY AS NEEDED FOR ANXIETY  Dispense: 60 tablet; Refill: 5  3. Generalized anxiety disorder See above medical treatment plan. - ALPRAZolam (XANAX) 1 MG tablet; TAKE 1/2 TO 1 TABLET BY MOUTH 2 TIMES A DAY AS NEEDED FOR ANXIETY  Dispense: 60 tablet; Refill: 5  4. Cyst of right ovary Much improved since salpingo-oopherectomy of the right at the end of august. Followed by Dr. Kenton Kingfisher, [place].   5. Vitamin D deficiency H/O this. Will check labs as below and f/u pending results. - Vitamin D (25 hydroxy)  6. Chronic midline low back pain with bilateral sciatica Improving with medication management. Followed by Ortho.  Possible hypermobility. Patient will call if she wishes to proceed with further work up.   --------------------------------------------------------------------    Mar Daring, PA-C  [facility]

## 2018-11-26 ENCOUNTER — Telehealth: Payer: Self-pay | Admitting: *Deleted

## 2018-11-26 LAB — CBC WITH DIFFERENTIAL/PLATELET
Basophils Absolute: 0.1 10*3/uL (ref 0.0–0.2)
Basos: 1 %
EOS (ABSOLUTE): 0.1 10*3/uL (ref 0.0–0.4)
Eos: 2 %
Hematocrit: 39.1 % (ref 34.0–46.6)
Hemoglobin: 13.2 g/dL (ref 11.1–15.9)
Immature Grans (Abs): 0 10*3/uL (ref 0.0–0.1)
Immature Granulocytes: 0 %
Lymphocytes Absolute: 2.9 10*3/uL (ref 0.7–3.1)
Lymphs: 35 %
MCH: 31.1 pg (ref 26.6–33.0)
MCHC: 33.8 g/dL (ref 31.5–35.7)
MCV: 92 fL (ref 79–97)
Monocytes Absolute: 0.5 10*3/uL (ref 0.1–0.9)
Monocytes: 6 %
Neutrophils Absolute: 4.7 10*3/uL (ref 1.4–7.0)
Neutrophils: 56 %
Platelets: 228 10*3/uL (ref 150–450)
RBC: 4.24 x10E6/uL (ref 3.77–5.28)
RDW: 12.2 % (ref 11.7–15.4)
WBC: 8.2 10*3/uL (ref 3.4–10.8)

## 2018-11-26 LAB — COMPREHENSIVE METABOLIC PANEL
ALT: 26 IU/L (ref 0–32)
AST: 24 IU/L (ref 0–40)
Albumin/Globulin Ratio: 1.9 (ref 1.2–2.2)
Albumin: 4.3 g/dL (ref 3.8–4.8)
Alkaline Phosphatase: 82 IU/L (ref 39–117)
BUN/Creatinine Ratio: 13 (ref 9–23)
BUN: 8 mg/dL (ref 6–24)
Bilirubin Total: 0.3 mg/dL (ref 0.0–1.2)
CO2: 27 mmol/L (ref 20–29)
Calcium: 9.9 mg/dL (ref 8.7–10.2)
Chloride: 101 mmol/L (ref 96–106)
Creatinine, Ser: 0.64 mg/dL (ref 0.57–1.00)
GFR calc Af Amer: 129 mL/min/{1.73_m2} (ref >59)
GFR calc non Af Amer: 112 mL/min/{1.73_m2} (ref >59)
Globulin, Total: 2.3 g/dL (ref 1.5–4.5)
Glucose: 72 mg/dL (ref 65–99)
Potassium: 3.9 mmol/L (ref 3.5–5.2)
Sodium: 140 mmol/L (ref 134–144)
Total Protein: 6.6 g/dL (ref 6.0–8.5)

## 2018-11-26 LAB — LIPID PANEL
Chol/HDL Ratio: 3.5 ratio (ref 0.0–4.4)
Cholesterol, Total: 227 mg/dL — ABNORMAL HIGH (ref 100–199)
HDL: 64 mg/dL (ref >39)
LDL Chol Calc (NIH): 138 mg/dL — ABNORMAL HIGH (ref 0–99)
Triglycerides: 143 mg/dL (ref 0–149)
VLDL Cholesterol Cal: 25 mg/dL (ref 5–40)

## 2018-11-26 LAB — HEMOGLOBIN A1C
Est. average glucose Bld gHb Est-mCnc: 100 mg/dL
Hgb A1c MFr Bld: 5.1 % (ref 4.8–5.6)

## 2018-11-26 LAB — VITAMIN D 25 HYDROXY (VIT D DEFICIENCY, FRACTURES): Vit D, 25-Hydroxy: 31.5 ng/mL (ref 30.0–100.0)

## 2018-11-26 LAB — TSH: TSH: 1.05 u[IU]/mL (ref 0.450–4.500)

## 2018-11-26 NOTE — Telephone Encounter (Signed)
LMOVM for pt to return call 

## 2018-11-26 NOTE — Telephone Encounter (Signed)
-----   Message from Mar Daring, [place] sent at 11/26/2018  1:01 PM EDT ----- Blood count is normal. Kidney and liver function are normal. Sodium, potassium and calcium are normal. Sugar is normal. Cholesterol up compared to last year, but also not a fasting lab. Will monitor. Continue working on healthy lifestyle modifications as well. Thyroid is normal. Vit D is low normal. What dose of Vit D is she currently taking?

## 2018-11-27 NOTE — Telephone Encounter (Signed)
Viewed by Deno Lunger on 11/26/2018 6:02 PM.

## 2018-11-30 ENCOUNTER — Encounter: Payer: Self-pay | Admitting: Physician Assistant

## 2018-11-30 DIAGNOSIS — E559 Vitamin D deficiency, unspecified: Secondary | ICD-10-CM

## 2018-11-30 MED ORDER — VITAMIN D (ERGOCALCIFEROL) 1.25 MG (50000 UNIT) PO CAPS: 50000.0000 [IU] | ORAL_CAPSULE | ORAL | 3 refills | Status: DC

## 2018-12-01 ENCOUNTER — Ambulatory Visit (INDEPENDENT_AMBULATORY_CARE_PROVIDER_SITE_OTHER): Payer: BC Managed Care – PPO | Admitting: Psychology

## 2018-12-01 DIAGNOSIS — F410 Panic disorder [episodic paroxysmal anxiety] without agoraphobia: Secondary | ICD-10-CM

## 2018-12-02 ENCOUNTER — Encounter: Payer: Self-pay | Admitting: Physician Assistant

## 2018-12-02 DIAGNOSIS — R238 Other skin changes: Secondary | ICD-10-CM

## 2018-12-02 DIAGNOSIS — M5416 Radiculopathy, lumbar region: Secondary | ICD-10-CM | POA: Diagnosis not present

## 2018-12-02 DIAGNOSIS — F418 Other specified anxiety disorders: Secondary | ICD-10-CM | POA: Diagnosis not present

## 2018-12-02 DIAGNOSIS — M47817 Spondylosis without myelopathy or radiculopathy, lumbosacral region: Secondary | ICD-10-CM | POA: Diagnosis not present

## 2018-12-02 DIAGNOSIS — R233 Spontaneous ecchymoses: Secondary | ICD-10-CM

## 2018-12-02 DIAGNOSIS — Z79899 Other long term (current) drug therapy: Secondary | ICD-10-CM | POA: Diagnosis not present

## 2018-12-02 DIAGNOSIS — M2550 Pain in unspecified joint: Secondary | ICD-10-CM

## 2018-12-14 DIAGNOSIS — Z79899 Other long term (current) drug therapy: Secondary | ICD-10-CM | POA: Diagnosis not present

## 2018-12-14 DIAGNOSIS — F418 Other specified anxiety disorders: Secondary | ICD-10-CM | POA: Diagnosis not present

## 2018-12-14 DIAGNOSIS — M5416 Radiculopathy, lumbar region: Secondary | ICD-10-CM | POA: Diagnosis not present

## 2018-12-14 DIAGNOSIS — M47817 Spondylosis without myelopathy or radiculopathy, lumbosacral region: Secondary | ICD-10-CM | POA: Diagnosis not present

## 2018-12-15 DIAGNOSIS — M542 Cervicalgia: Secondary | ICD-10-CM | POA: Diagnosis not present

## 2018-12-15 DIAGNOSIS — G43719 Chronic migraine without aura, intractable, without status migrainosus: Secondary | ICD-10-CM | POA: Diagnosis not present

## 2018-12-16 ENCOUNTER — Ambulatory Visit (INDEPENDENT_AMBULATORY_CARE_PROVIDER_SITE_OTHER): Payer: BC Managed Care – PPO | Admitting: Psychology

## 2018-12-16 DIAGNOSIS — F410 Panic disorder [episodic paroxysmal anxiety] without agoraphobia: Secondary | ICD-10-CM

## 2018-12-21 DIAGNOSIS — M47817 Spondylosis without myelopathy or radiculopathy, lumbosacral region: Secondary | ICD-10-CM | POA: Diagnosis not present

## 2018-12-28 DIAGNOSIS — M47817 Spondylosis without myelopathy or radiculopathy, lumbosacral region: Secondary | ICD-10-CM | POA: Diagnosis not present

## 2018-12-28 DIAGNOSIS — F418 Other specified anxiety disorders: Secondary | ICD-10-CM | POA: Diagnosis not present

## 2018-12-28 DIAGNOSIS — Z79899 Other long term (current) drug therapy: Secondary | ICD-10-CM | POA: Diagnosis not present

## 2018-12-28 DIAGNOSIS — M5416 Radiculopathy, lumbar region: Secondary | ICD-10-CM | POA: Diagnosis not present

## 2018-12-30 DIAGNOSIS — M47817 Spondylosis without myelopathy or radiculopathy, lumbosacral region: Secondary | ICD-10-CM | POA: Diagnosis not present

## 2018-12-31 ENCOUNTER — Ambulatory Visit (INDEPENDENT_AMBULATORY_CARE_PROVIDER_SITE_OTHER): Payer: BC Managed Care – PPO | Admitting: Psychology

## 2018-12-31 DIAGNOSIS — F411 Generalized anxiety disorder: Secondary | ICD-10-CM

## 2019-01-05 DIAGNOSIS — M47817 Spondylosis without myelopathy or radiculopathy, lumbosacral region: Secondary | ICD-10-CM | POA: Diagnosis not present

## 2019-01-08 DIAGNOSIS — M5416 Radiculopathy, lumbar region: Secondary | ICD-10-CM | POA: Diagnosis not present

## 2019-01-11 DIAGNOSIS — M47817 Spondylosis without myelopathy or radiculopathy, lumbosacral region: Secondary | ICD-10-CM | POA: Diagnosis not present

## 2019-01-13 ENCOUNTER — Ambulatory Visit (INDEPENDENT_AMBULATORY_CARE_PROVIDER_SITE_OTHER): Payer: BC Managed Care – PPO | Admitting: Psychology

## 2019-01-13 DIAGNOSIS — F410 Panic disorder [episodic paroxysmal anxiety] without agoraphobia: Secondary | ICD-10-CM

## 2019-01-14 ENCOUNTER — Other Ambulatory Visit: Payer: Self-pay

## 2019-01-14 ENCOUNTER — Emergency Department
Admission: EM | Admit: 2019-01-14 | Discharge: 2019-01-14 | Discharge status: Against Medical Advice | Payer: BC Managed Care – PPO | Attending: Emergency Medicine | Admitting: Emergency Medicine

## 2019-01-14 ENCOUNTER — Encounter: Payer: Self-pay | Admitting: Emergency Medicine

## 2019-01-14 DIAGNOSIS — J45909 Unspecified asthma, uncomplicated: Secondary | ICD-10-CM | POA: Diagnosis not present

## 2019-01-14 DIAGNOSIS — M545 Low back pain: Secondary | ICD-10-CM | POA: Insufficient documentation

## 2019-01-14 DIAGNOSIS — Z79899 Other long term (current) drug therapy: Secondary | ICD-10-CM | POA: Insufficient documentation

## 2019-01-14 DIAGNOSIS — G8929 Other chronic pain: Secondary | ICD-10-CM | POA: Diagnosis not present

## 2019-01-14 MED ORDER — LIDOCAINE 5 % EX PTCH: 1.0000 | MEDICATED_PATCH | CUTANEOUS | Status: DC

## 2019-01-14 MED ORDER — LIDOCAINE 5 % EX PTCH: 1.0000 | MEDICATED_PATCH | Freq: Two times a day (BID) | CUTANEOUS | 0 refills | Status: DC

## 2019-01-14 NOTE — ED Notes (Signed)
Pt not in room for medication  Provider aware

## 2019-01-14 NOTE — ED Notes (Signed)
See triage note  Presents with lower back pain  Sates hx of bulging disc  States pain has cont'd   States she had injection last Friday  Tingling down right leg

## 2019-01-14 NOTE — ED Provider Notes (Signed)
[facility] Emergency Department Provider Note  ____________________________________________   First MD Initiated Contact with Patient 01/14/19 1128     (approximate)  I have reviewed the triage vital signs and the nursing notes.   HISTORY  Chief Complaint Back Pain   HPI [name] is a 38 y.o. female presents to the ED with complaint of low back pain.  Patient states she has a history of bulging disc and has been seen 2 different physicians for the same complaint.  She states that last Friday she had a epidural done at [facility] in [place].  She states that this is giving her little relief and that she has some tingling down her right leg.  Patient denies any incontinence of bowel or bladder or saddle anesthesias.  Patient continues to ambulate without any assistance.  Today she is not contacted either provider to let them know that she is not improved.  She states that she does have a follow-up appointment with one of her physicians.  She rates her pain as a 9/10.     Past Medical History:  Diagnosis Date  . Acute pyelonephritis 06/29/2012  . Anxiety   . Arthritis   . Asthma    AS A CHILD  . Complication of anesthesia    TROUBLE URINATING IN THE PAST  . Gallstones   . GERD (gastroesophageal reflux disease)   . History of kidney stones   . Kidney stones    bilateral  . Migraine   . Pelvic pain in female   . PONV (postoperative nausea and vomiting)    WITH HYSTERECTOMY ONLY  . Pyelonephritis 2015  . Renal mass 2017   right. considered a normal cyst on kidney.  . Tobacco abuse     Patient Active Problem List   Diagnosis Date Noted  . Endometriosis 08/27/2017  . Pelvic pain 08/27/2017  . IBS (irritable bowel syndrome) 02/02/2015  . Herpes simplex 02/02/2015  . Asthma, mild intermittent 11/03/2014  . Migraine without aura or status migrainosus 11/03/2014  . Gastroesophageal reflux disease without esophagitis 11/03/2014  .  Generalized anxiety disorder 11/03/2014  . Cyst of ovary 08/06/2013  . Appendicolith 07/16/2013  . Chronic constipation 06/30/2012  . History of pyelonephritis 06/29/2012  . Nephrolithiasis 06/28/2012    Past Surgical History:  Procedure Laterality Date  . ABDOMINAL HYSTERECTOMY  2006  . ANKLE ARTHROSCOPY Left 2017  . ANKLE ARTHROSCOPY Left 11/24/2015  . APPENDECTOMY  07/26/13  . CAST APPLICATION  89/37/3428   Procedure: CAST APPLICATION;  Surgeon: Johnn Hai, MD;  Location: [facility];  Service: Orthopedics;  Laterality: Left;  . CHOLECYSTECTOMY  03/11/2011   Procedure: LAPAROSCOPIC CHOLECYSTECTOMY WITH INTRAOPERATIVE CHOLANGIOGRAM;  Surgeon: Gayland Curry, MD;  Location: [place];  Service: General;  Laterality: N/A;  . CHOLECYSTECTOMY, LAPAROSCOPIC    . CYSTOSCOPY/URETEROSCOPY/HOLMIUM LASER/STENT PLACEMENT Left 06/18/2016   Procedure: CYSTOSCOPY/URETEROSCOPY/HOLMIUM LASER/STENT PLACEMENT;  Surgeon: Hollice Espy, MD;  Location: [facility];  Service: Urology;  Laterality: Left;  . LAPAROSCOPIC OVARIAN CYSTECTOMY Right 09/02/2017   Procedure: LAPAROSCOPIC OVARIAN CYSTECTOMY;  Surgeon: Gae Dry, MD;  Location: [facility];  Service: Gynecology;  Laterality: Right;  . LITHOTRIPSY     x 4  . ORIF ANKLE FRACTURE  12/25/2011   Procedure: OPEN REDUCTION INTERNAL FIXATION (ORIF) ANKLE FRACTURE;  Surgeon: Johnn Hai, MD;  Location: [facility];  Service: Orthopedics;  Laterality: Left;  . TONSILLECTOMY      Prior to Admission medications   Medication Sig  Start Date End Date Taking? Authorizing Provider  ALPRAZolam Duanne Moron) 1 MG tablet TAKE 1/2 TO 1 TABLET BY MOUTH 2 TIMES A DAY AS NEEDED FOR ANXIETY 11/25/18   Mar Daring, PA-C  baclofen (LIORESAL) 10 MG tablet Take 10 mg by mouth 2 (two) times daily as needed (migraines).  05/15/16   [provider]  chlorproMAZINE (THORAZINE) 25 MG tablet Take 25 mg by mouth every 4 (four) hours as needed (migraines).     [provider]  DULoxetine (CYMBALTA) 20 MG capsule Take 20 mg by mouth at bedtime.     [provider]  gabapentin (NEURONTIN) 400 MG capsule TAKE 1 CAPSULE BY MOUTH TWICE DAILY 11/25/18   Mar Daring, PA-C  HYDROcodone-acetaminophen (NORCO) 10-325 MG tablet Take 0.5-1 tablets by mouth every 6 (six) hours as needed for severe pain. Max 2 tabs per day    [provider]  lidocaine (LIDODERM) 5 % Place 1 patch onto the skin every 12 (twelve) hours. Remove & Discard patch within 12 hours or as directed by MD 01/14/19 01/14/20  Johnn Hai, PA-C  meloxicam (MOBIC) 7.5 MG tablet Take 7.5 mg by mouth daily.    [provider]  metroNIDAZOLE (METROGEL) 0.75 % vaginal gel Use one applicator intravaginally after sexual intercourse Patient taking differently: Place 1 Applicatorful vaginally daily as needed (after sexual intercourse).  03/11/18   Mar Daring, PA-C  Multiple Vitamin (MULTIVITAMIN WITH MINERALS) TABS tablet Take 1 tablet by mouth daily.    [provider]  naloxone [facility]) 4 MG/0.1ML LIQD nasal spray kit Place 1 spray into the nose as needed (opioid overdose).    [provider]  omeprazole (PRILOSEC) 20 MG capsule TAKE 1 CAPSULE BY MOUTH ONCE DAILY Patient taking differently: Take 20 mg by mouth at bedtime.  07/06/18   Mar Daring, PA-C  OnabotulinumtoxinA (BOTOX IJ) Inject 1 each as directed every 3 (three) months. Alternating with trigger point inj    [provider]  polyethylene glycol (MIRALAX / GLYCOLAX) packet Take 17 g by mouth every other day.     [provider]  promethazine (PHENERGAN) 25 MG tablet Take 1 tablet (25 mg total) by mouth every 8 (eight) hours as needed for nausea or vomiting. 11/03/18   Mar Daring, PA-C  valACYclovir (VALTREX) 500 MG tablet Take 2 tablets (1,000 mg total) by mouth 2 (two) times daily as needed (cold sores). 03/11/18   Mar Daring, PA-C  Vitamin D,  Ergocalciferol, (DRISDOL) 1.25 MG (50000 UT) CAPS capsule Take 1 capsule (50,000 Units total) by mouth every 7 (seven) days. 11/30/18   Mar Daring, PA-C    Allergies Compazine and Metoclopramide  Family History  Problem Relation Age of Onset  . Diabetes Mother   . Diabetes Father   . Heart failure Father   . Hypertension Father   . Pulmonary fibrosis Father   . Nephrolithiasis Father   . Kidney cancer Neg Hx   . Bladder Cancer Neg Hx     Social History Social History   Tobacco Use  . Smoking status: Former Smoker    Packs/day: 0.50    Years: 15.00    Pack years: 7.50    Types: Cigarettes    Start date: 11/03/1999    Quit date: 09/26/2014    Years since quitting: 4.3  . Smokeless tobacco: Never Used  Substance Use Topics  . Alcohol use: Yes    Alcohol/week: 0.0 standard drinks  Comment: social-rare  . Drug use: No    Review of Systems Constitutional: No fever/chills Cardiovascular: Denies chest pain. Respiratory: Denies shortness of breath. Gastrointestinal: No abdominal pain.  No nausea, no vomiting.  Genitourinary: Negative for dysuria. Musculoskeletal: Positive for low back pain.  Positive for history of bulging disc. Skin: Negative for rash. Neurological: Negative for headaches, focal weakness or numbness. ___________________________________________   PHYSICAL EXAM:  VITAL SIGNS: ED Triage Vitals [01/14/19 1019]  Enc Vitals Group     BP 140/82     Pulse Rate 96     Resp 18     Temp 98.1 F (36.7 C)     Temp Source Oral     SpO2 100 %     Weight 200 lb (90.7 kg)     Height 5' 7" (1.702 m)     Head Circumference      Peak Flow      Pain Score 9     Pain Loc      Pain Edu?      Excl. in [place]?    Constitutional: Alert and oriented. Well appearing and in no acute distress. Eyes: Conjunctivae are normal.  Head: Atraumatic. Neck: No stridor.   Cardiovascular: Normal rate, regular rhythm. Grossly normal heart sounds.  Good peripheral  circulation. Respiratory: Normal respiratory effort.  No retractions. Lungs CTAB. Gastrointestinal: Soft and nontender. No distention.  No CVA tenderness. Musculoskeletal: On exam of the lower back there is no gross deformity.  There is some diffuse tenderness on palpation of the sacral area and paravertebral muscles bilaterally.  Range of motion is restricted secondary to discomfort.  No active muscle spasms are seen.  Good muscle strength bilaterally.  Patient is able to stand without any assistance and normal gait was noted. Neurologic:  Normal speech and language. No gross focal neurologic deficits are appreciated. No gait instability. Skin:  Skin is warm, dry and intact.  Psychiatric: Mood and affect are normal. Speech and behavior are normal.  ____________________________________________   LABS (all labs ordered are listed, but only abnormal results are displayed)  Labs Reviewed - No data to display   PROCEDURES  Procedure(s) performed (including Critical Care):  Procedures   ____________________________________________   INITIAL IMPRESSION / ASSESSMENT AND PLAN / ED COURSE  As part of my medical decision making, I reviewed the following data within the electronic MEDICAL RECORD NUMBER Notes from prior ED visits and [place] Controlled Substance Database  38 year old female presents to the ED with complaint of chronic low back pain without sciatica.  Patient has been seen to different physicians and on Friday had an epidural done at [facility] in [place].  She denies any incontinence of bowel or bladder or saddle anesthesias.  Patient continues to ambulate with out assistance.  She also has a prescription for hydrocodone 10 mg  #180 that was prescribed on 12/22/2018.  A order was placed for a Lidoderm patch before the patient left and she was made aware that a prescription would be sent to her pharmacy.  Patient eloped from the room prior to the nurse applying the Lidoderm patch to her  back.  The elopement was not announced and no one knew that she was gone.  Patient did not receive discharge instructions.  ____________________________________________   FINAL CLINICAL IMPRESSION(S) / ED DIAGNOSES  Final diagnoses:  Chronic bilateral low back pain without sciatica  Exacerbation of chronic back pain     ED Discharge Orders  Ordered    lidocaine (LIDODERM) 5 %  Every 12 hours     01/14/19 1229           Note:  This document was prepared using Dragon voice recognition software and may include unintentional dictation errors.    Johnn Hai, PA-C 01/14/19 1654    Delman Kitten, MD 01/16/19 [phone]

## 2019-01-14 NOTE — ED Triage Notes (Signed)
Pt currently seeing 2 different Drs for the bulging disc in her lower back, states she had an epidural last Friday with no relief, today, tingling down legs bilaterally starting on the left. NAD.

## 2019-01-15 ENCOUNTER — Other Ambulatory Visit: Payer: Self-pay | Admitting: Physician Assistant

## 2019-01-15 DIAGNOSIS — K219 Gastro-esophageal reflux disease without esophagitis: Secondary | ICD-10-CM

## 2019-01-15 DIAGNOSIS — G43719 Chronic migraine without aura, intractable, without status migrainosus: Secondary | ICD-10-CM | POA: Diagnosis not present

## 2019-01-18 DIAGNOSIS — M47817 Spondylosis without myelopathy or radiculopathy, lumbosacral region: Secondary | ICD-10-CM | POA: Diagnosis not present

## 2019-01-20 DIAGNOSIS — M47817 Spondylosis without myelopathy or radiculopathy, lumbosacral region: Secondary | ICD-10-CM | POA: Diagnosis not present

## 2019-01-20 DIAGNOSIS — M5416 Radiculopathy, lumbar region: Secondary | ICD-10-CM | POA: Diagnosis not present

## 2019-01-20 DIAGNOSIS — Z79899 Other long term (current) drug therapy: Secondary | ICD-10-CM | POA: Diagnosis not present

## 2019-01-20 DIAGNOSIS — F418 Other specified anxiety disorders: Secondary | ICD-10-CM | POA: Diagnosis not present

## 2019-01-27 ENCOUNTER — Ambulatory Visit (INDEPENDENT_AMBULATORY_CARE_PROVIDER_SITE_OTHER): Payer: BC Managed Care – PPO | Admitting: Psychology

## 2019-01-27 DIAGNOSIS — F410 Panic disorder [episodic paroxysmal anxiety] without agoraphobia: Secondary | ICD-10-CM

## 2019-02-08 ENCOUNTER — Ambulatory Visit: Payer: BC Managed Care – PPO | Admitting: Psychology

## 2019-02-08 DIAGNOSIS — M5416 Radiculopathy, lumbar region: Secondary | ICD-10-CM | POA: Diagnosis not present

## 2019-02-10 ENCOUNTER — Ambulatory Visit: Payer: BC Managed Care – PPO | Admitting: Psychology

## 2019-02-17 DIAGNOSIS — M542 Cervicalgia: Secondary | ICD-10-CM | POA: Diagnosis not present

## 2019-02-17 DIAGNOSIS — G43719 Chronic migraine without aura, intractable, without status migrainosus: Secondary | ICD-10-CM | POA: Diagnosis not present

## 2019-02-22 ENCOUNTER — Ambulatory Visit: Payer: BC Managed Care – PPO | Admitting: Psychology

## 2019-03-10 ENCOUNTER — Emergency Department
Admission: EM | Admit: 2019-03-10 | Discharge: 2019-03-10 | Disposition: A | Discharge status: Home / Self Care | Payer: BC Managed Care – PPO | Attending: Student | Admitting: Student

## 2019-03-10 ENCOUNTER — Emergency Department: Payer: BC Managed Care – PPO

## 2019-03-10 ENCOUNTER — Encounter: Payer: Self-pay | Admitting: Emergency Medicine

## 2019-03-10 ENCOUNTER — Ambulatory Visit: Payer: BC Managed Care – PPO | Admitting: Psychology

## 2019-03-10 ENCOUNTER — Other Ambulatory Visit: Payer: Self-pay

## 2019-03-10 DIAGNOSIS — Z79899 Other long term (current) drug therapy: Secondary | ICD-10-CM | POA: Insufficient documentation

## 2019-03-10 DIAGNOSIS — Z87891 Personal history of nicotine dependence: Secondary | ICD-10-CM | POA: Diagnosis not present

## 2019-03-10 DIAGNOSIS — R0789 Other chest pain: Secondary | ICD-10-CM

## 2019-03-10 DIAGNOSIS — M7918 Myalgia, other site: Secondary | ICD-10-CM | POA: Insufficient documentation

## 2019-03-10 DIAGNOSIS — R079 Chest pain, unspecified: Secondary | ICD-10-CM | POA: Diagnosis not present

## 2019-03-10 LAB — CBC
HCT: 44.1 % (ref 36.0–46.0)
Hemoglobin: 14.1 g/dL (ref 12.0–15.0)
MCH: 31.3 pg (ref 26.0–34.0)
MCHC: 32 g/dL (ref 30.0–36.0)
MCV: 98 fL (ref 80.0–100.0)
Platelets: 238 10*3/uL (ref 150–400)
RBC: 4.5 MIL/uL (ref 3.87–5.11)
RDW: 12.3 % (ref 11.5–15.5)
WBC: 9.6 10*3/uL (ref 4.0–10.5)
nRBC: 0 % (ref 0.0–0.2)

## 2019-03-10 LAB — BASIC METABOLIC PANEL
Anion gap: 6 (ref 5–15)
BUN: 12 mg/dL (ref 6–20)
CO2: 30 mmol/L (ref 22–32)
Calcium: 9.2 mg/dL (ref 8.9–10.3)
Chloride: 103 mmol/L (ref 98–111)
Creatinine, Ser: 0.81 mg/dL (ref 0.44–1.00)
GFR calc Af Amer: >60 mL/min (ref >60)
GFR calc non Af Amer: >60 mL/min (ref >60)
Glucose, Bld: 104 mg/dL — ABNORMAL HIGH (ref 70–99)
Potassium: 4.1 mmol/L (ref 3.5–5.1)
Sodium: 139 mmol/L (ref 135–145)

## 2019-03-10 LAB — TROPONIN I (HIGH SENSITIVITY)
Troponin I (High Sensitivity): <2 ng/L (ref <18)
Troponin I (High Sensitivity): <2 ng/L (ref <18)

## 2019-03-10 MED ORDER — KETOROLAC TROMETHAMINE 10 MG PO TABS: ORAL_TABLET | ORAL | 0 refills | Status: DC

## 2019-03-10 MED ORDER — KETOROLAC TROMETHAMINE 30 MG/ML IJ SOLN
30.0000 mg | Freq: Once | INTRAMUSCULAR | Status: AC
  Administered 2019-03-10: 30 mg via INTRAMUSCULAR
  Filled 2019-03-10: qty 1

## 2019-03-10 NOTE — Discharge Instructions (Signed)
Follow-up with your primary care provider if any continued problems.  Return to the emergency department immediately if any severe worsening of your symptoms such as shortness of breath or difficulty breathing.  The injection that you received in the ED was Toradol.  The same medication was sent to your pharmacy and is 1 every 6 hours with food.  This is a short-term drug however if this seems to be working in your primary care provider can prescribe a similar medication that you can take for longer period of time.

## 2019-03-10 NOTE — ED Notes (Signed)
Pt left prior to receiving d/c papers

## 2019-03-10 NOTE — ED Provider Notes (Signed)
[facility] Emergency Department Provider Note  ____________________________________________   First MD Initiated Contact with Patient 03/10/19 1100     (approximate)  I have reviewed the triage vital signs and the nursing notes.   HISTORY  Chief Complaint Chest Pain and Nausea   HPI [name] is a 39 y.o. female presents to the ED with complaint of chest pain anteriorly and over to her left chest and radiating into her left shoulder for approximately 6 weeks.  Patient states that the episodes are intermittent.  She denies any shortness of breath, indigestion, diaphoresis, nausea or vomiting.  Patient has been trying some Tylenol without any relief.  She denies any recent travel, smoking, birth control, or traveling.  She also denies any recent injury to her lower extremities.  Patient also denies any GI related issues such as reflux with this sensation.  Currently she rates her pain as 7 out of 10.      Past Medical History:  Diagnosis Date  . Acute pyelonephritis 06/29/2012  . Anxiety   . Arthritis   . Asthma    AS A CHILD  . Complication of anesthesia    TROUBLE URINATING IN THE PAST  . Gallstones   . GERD (gastroesophageal reflux disease)   . History of kidney stones   . Kidney stones    bilateral  . Migraine   . Pelvic pain in female   . PONV (postoperative nausea and vomiting)    WITH HYSTERECTOMY ONLY  . Pyelonephritis 2015  . Renal mass 2017   right. considered a normal cyst on kidney.  . Tobacco abuse     Patient Active Problem List   Diagnosis Date Noted  . Endometriosis 08/27/2017  . Pelvic pain 08/27/2017  . IBS (irritable bowel syndrome) 02/02/2015  . Herpes simplex 02/02/2015  . Asthma, mild intermittent 11/03/2014  . Migraine without aura or status migrainosus 11/03/2014  . Gastroesophageal reflux disease without esophagitis 11/03/2014  . Generalized anxiety disorder 11/03/2014  . Cyst of ovary 08/06/2013  .  Appendicolith 07/16/2013  . Chronic constipation 06/30/2012  . History of pyelonephritis 06/29/2012  . Nephrolithiasis 06/28/2012    Past Surgical History:  Procedure Laterality Date  . ABDOMINAL HYSTERECTOMY  2006  . ANKLE ARTHROSCOPY Left 2017  . ANKLE ARTHROSCOPY Left 11/24/2015  . APPENDECTOMY  07/26/13  . CAST APPLICATION  17/51/0258   Procedure: CAST APPLICATION;  Surgeon: Johnn Hai, MD;  [facility];  Service: Orthopedics;  Laterality: Left;  . CHOLECYSTECTOMY  03/11/2011   Procedure: LAPAROSCOPIC CHOLECYSTECTOMY WITH INTRAOPERATIVE CHOLANGIOGRAM;  Surgeon: Gayland Curry, MD;  Location: [place];  Service: General;  Laterality: N/A;  . CHOLECYSTECTOMY, LAPAROSCOPIC    . CYSTOSCOPY/URETEROSCOPY/HOLMIUM LASER/STENT PLACEMENT Left 06/18/2016   Procedure: CYSTOSCOPY/URETEROSCOPY/HOLMIUM LASER/STENT PLACEMENT;  Surgeon: Hollice Espy, MD;  Location: [facility];  Service: Urology;  Laterality: Left;  . LAPAROSCOPIC OVARIAN CYSTECTOMY Right 09/02/2017   Procedure: LAPAROSCOPIC OVARIAN CYSTECTOMY;  Surgeon: Gae Dry, MD;  Location: [facility];  Service: Gynecology;  Laterality: Right;  . LITHOTRIPSY     x 4  . ORIF ANKLE FRACTURE  12/25/2011   Procedure: OPEN REDUCTION INTERNAL FIXATION (ORIF) ANKLE FRACTURE;  Surgeon: Johnn Hai, MD;  [facility];  Service: Orthopedics;  Laterality: Left;  . TONSILLECTOMY      Prior to Admission medications   Medication Sig Start Date End Date Taking? Authorizing Provider  ALPRAZolam (XANAX) 1 MG tablet TAKE 1/2 TO 1 TABLET BY MOUTH 2  TIMES A DAY AS NEEDED FOR ANXIETY 11/25/18   Mar Daring, PA-C  baclofen (LIORESAL) 10 MG tablet Take 10 mg by mouth 2 (two) times daily as needed (migraines).  05/15/16   [provider]  chlorproMAZINE (THORAZINE) 25 MG tablet Take 25 mg by mouth every 4 (four) hours as needed (migraines).     [provider]  DULoxetine (CYMBALTA) 20 MG capsule Take 20 mg by mouth at bedtime.      [provider]  gabapentin (NEURONTIN) 400 MG capsule TAKE 1 CAPSULE BY MOUTH TWICE DAILY 11/25/18   Mar Daring, PA-C  HYDROcodone-acetaminophen (NORCO) 10-325 MG tablet Take 0.5-1 tablets by mouth every 6 (six) hours as needed for severe pain. Max 2 tabs per day    [provider]  ketorolac (TORADOL) 10 MG tablet Take 1 every 6 hours with food as needed for muscle pain. 03/10/19   Johnn Hai, PA-C  lidocaine (LIDODERM) 5 % Place 1 patch onto the skin every 12 (twelve) hours. Remove & Discard patch within 12 hours or as directed by MD 01/14/19 01/14/20  Johnn Hai, PA-C  meloxicam (MOBIC) 7.5 MG tablet Take 7.5 mg by mouth daily.    [provider]  metroNIDAZOLE (METROGEL) 0.75 % vaginal gel Use one applicator intravaginally after sexual intercourse Patient taking differently: Place 1 Applicatorful vaginally daily as needed (after sexual intercourse).  03/11/18   Mar Daring, PA-C  Multiple Vitamin (MULTIVITAMIN WITH MINERALS) TABS tablet Take 1 tablet by mouth daily.    [provider]  naloxone [facility]) 4 MG/0.1ML LIQD nasal spray kit Place 1 spray into the nose as needed (opioid overdose).    [provider]  omeprazole (PRILOSEC) 20 MG capsule Take 1 capsule (20 mg total) by mouth at bedtime. 01/16/19   Mar Daring, PA-C  OnabotulinumtoxinA (BOTOX IJ) Inject 1 each as directed every 3 (three) months. Alternating with trigger point inj    [provider]  polyethylene glycol (MIRALAX / GLYCOLAX) packet Take 17 g by mouth every other day.     [provider]  promethazine (PHENERGAN) 25 MG tablet Take 1 tablet (25 mg total) by mouth every 8 (eight) hours as needed for nausea or vomiting. 11/03/18   Mar Daring, PA-C  valACYclovir (VALTREX) 500 MG tablet Take 2 tablets (1,000 mg total) by mouth 2 (two) times daily as needed (cold sores). 03/11/18   Mar Daring, PA-C  Vitamin D,  Ergocalciferol, (DRISDOL) 1.25 MG (50000 UT) CAPS capsule Take 1 capsule (50,000 Units total) by mouth every 7 (seven) days. 11/30/18   Mar Daring, PA-C    Allergies Compazine and Metoclopramide  Family History  Problem Relation Age of Onset  . Diabetes Mother   . Diabetes Father   . Heart failure Father   . Hypertension Father   . Pulmonary fibrosis Father   . Nephrolithiasis Father   . Kidney cancer Neg Hx   . Bladder Cancer Neg Hx     Social History Social History   Tobacco Use  . Smoking status: Former Smoker    Packs/day: 0.50    Years: 15.00    Pack years: 7.50    Types: Cigarettes    Start date: 11/03/1999    Quit date: 09/26/2014    Years since quitting: 4.4  . Smokeless tobacco: Never Used  Substance Use Topics  . Alcohol use: Yes    Alcohol/week: 0.0 standard drinks    Comment: social-rare  .  Drug use: No    Review of Systems Constitutional: No fever/chills Eyes: No visual changes. ENT: No sore throat. Cardiovascular: Denies chest pain.  Negative for past cardiac history. Respiratory: Denies shortness of breath.  Negative for cough. Gastrointestinal: No abdominal pain.  No nausea, no vomiting.   Musculoskeletal: Positive left anterior chest wall pain.  Positive for left shoulder pain. Skin: Negative for rash. Neurological: Negative for headaches, focal weakness or numbness. ____________________________________________   PHYSICAL EXAM:  VITAL SIGNS: ED Triage Vitals  Enc Vitals Group     BP 03/10/19 0845 116/86     Pulse Rate 03/10/19 0845 78     Resp 03/10/19 0845 18     Temp 03/10/19 0845 98.6 F (37 C)     Temp Source 03/10/19 0845 Oral     SpO2 03/10/19 0845 99 %     Weight 03/10/19 0845 200 lb (90.7 kg)     Height 03/10/19 0845 _0  (1.702 m)     Head Circumference --      Peak Flow --      Pain Score 03/10/19 0905 7     Pain Loc --      Pain Edu? --      Excl. in [place]? --    Constitutional: Alert and oriented. Well appearing  and in no acute distress. Eyes: Conjunctivae are normal.  Head: Atraumatic. Neck: No stridor.   Cardiovascular: Normal rate, regular rhythm. Grossly normal heart sounds.  Good peripheral circulation. Respiratory: Normal respiratory effort.  No retractions. Lungs CTAB. Gastrointestinal: Soft and nontender. No distention.  No epigastric tenderness is noted on palpation.  Bowel sounds normoactive x4 quadrants. Musculoskeletal: On examination of the back and anterior chest there is no gross deformity.  No point tenderness on palpation of anterior chest.  There is some tenderness on palpation of the parascapular muscles.  No deformity is noted.  Patient is able move upper extremities without any restriction. Neurologic:  Normal speech and language. No gross focal neurologic deficits are appreciated. No gait instability. Skin:  Skin is warm, dry and intact.  No rash noted. Psychiatric: Mood and affect are normal. Speech and behavior are normal.  ____________________________________________   LABS (all labs ordered are listed, but only abnormal results are displayed)  Labs Reviewed  BASIC METABOLIC PANEL - Abnormal; Notable for the following components:      Result Value   Glucose, Bld 104 (*)    All other components within normal limits  CBC  TROPONIN I (HIGH SENSITIVITY)  TROPONIN I (HIGH SENSITIVITY)   ____________________________________________  EKG  EKG was reviewed by Dr.Siadecki  8:51 AM Normal sinus rhythm with ventricular rate of 90, PR interval 136, QRS duration 76. ____________________________________________  RADIOLOGY  Official radiology report(s): DG Chest 2 View  Result Date: 03/10/2019 CLINICAL DATA:  Mid to left chest pain with radiation to the left shoulder. EXAM: CHEST - 2 VIEW COMPARISON:  04/30/2011 and CT chest 03/24/2011. FINDINGS: Trachea is midline. Heart size normal. Lungs are clear. No pleural fluid. IMPRESSION: No acute findings. Electronically Signed    By: Lorin Picket M.D.   On: 03/10/2019 09:34    ____________________________________________   PROCEDURES  Procedure(s) performed (including Critical Care):  Procedures   ____________________________________________   INITIAL IMPRESSION / ASSESSMENT AND PLAN / ED COURSE  As part of my medical decision making, I reviewed the following data within the electronic MEDICAL RECORD NUMBER Notes from prior ED visits and [place] Controlled Substance Database  39 year old female presents to  the ED with complaint of intermittent chest wall pain for the past 6 weeks.  Patient describes it as mid to left anterior chest pain that is squeezing in nature.  She denies any previous cardiac history or symptoms that are consistent with a pulmonary embolus.  Patient risk factors are low.  Patient was given Toradol 30 mg IM while in the ED and after 30 minutes states that her pain is easing off.  Most likely this is a muscle skeletal issue as all of her lab work including her troponin, EKG, chest x-ray and lab work were all within normal limits.  Patient will continue taking the Toradol 10 mg p.o. every 6 hours with food.  She is to follow-up with her PCP if any continued problems.  She is to return to the emergency department if any sudden worsening of her symptoms or difficulty breathing.  ____________________________________________   FINAL CLINICAL IMPRESSION(S) / ED DIAGNOSES  Final diagnoses:  Anterior chest wall pain  Musculoskeletal pain     ED Discharge Orders         Ordered    ketorolac (TORADOL) 10 MG tablet     03/10/19 1201           Note:  This document was prepared using Dragon voice recognition software and may include unintentional dictation errors.    Johnn Hai, PA-C 03/10/19 1218    Lilia Pro., MD 03/10/19 5146

## 2019-03-10 NOTE — ED Triage Notes (Signed)
Pt reports CP to her mid to left chest that is squeezing in nature and sometimes radiates to her left shoulder. Pt reports has had these episodes intermittently for the last 6 weeks.

## 2019-03-12 DIAGNOSIS — M5416 Radiculopathy, lumbar region: Secondary | ICD-10-CM | POA: Diagnosis not present

## 2019-03-12 DIAGNOSIS — Z79899 Other long term (current) drug therapy: Secondary | ICD-10-CM | POA: Diagnosis not present

## 2019-03-12 DIAGNOSIS — M47817 Spondylosis without myelopathy or radiculopathy, lumbosacral region: Secondary | ICD-10-CM | POA: Diagnosis not present

## 2019-03-18 ENCOUNTER — Encounter: Payer: Self-pay | Admitting: Physician Assistant

## 2019-03-18 ENCOUNTER — Telehealth (INDEPENDENT_AMBULATORY_CARE_PROVIDER_SITE_OTHER): Payer: BC Managed Care – PPO | Admitting: Physician Assistant

## 2019-03-18 ENCOUNTER — Other Ambulatory Visit: Payer: Self-pay

## 2019-03-18 DIAGNOSIS — R5383 Other fatigue: Secondary | ICD-10-CM

## 2019-03-18 DIAGNOSIS — M7989 Other specified soft tissue disorders: Secondary | ICD-10-CM | POA: Diagnosis not present

## 2019-03-18 DIAGNOSIS — M2550 Pain in unspecified joint: Secondary | ICD-10-CM | POA: Diagnosis not present

## 2019-03-18 DIAGNOSIS — R0789 Other chest pain: Secondary | ICD-10-CM | POA: Diagnosis not present

## 2019-03-18 NOTE — Patient Instructions (Signed)
Joint Pain Joint pain (arthralgia) may be caused by many things. Joint pain is likely to go away when you follow instructions from your health care provider for relieving pain at home. However, joint pain can also be caused by conditions that require more treatment. Common causes of joint pain include:  Bruising in the area of the joint.  Injury caused by repeating certain movements too many times (overuse injury).  Age-related joint wear and tear (osteoarthritis).  Buildup of uric acid crystals in the joint (gout).  Inflammation of the joint (rheumatic disease).  Various other forms of arthritis.  Infections of the joint (septic arthritis) or of the bone (osteomyelitis). Your health care provider may recommend that you take pain medicine or wear a supportive device like an elastic bandage, sling, or splint. If your joint pain continues, you may need lab or imaging tests to diagnose the cause of your joint pain. Follow these instructions at home: Managing pain, stiffness, and swelling   If directed, put ice on the painful area. Icing can help to relieve joint pain and swelling. ? Put ice in a plastic bag. ? Place a towel between your skin and the bag. ? Leave the ice on for 20 minutes, 2-3 times a day.  If directed, apply heat to the painful area as often as told by your health care provider. Heat can reduce the stiffness of your muscles and joints. Use the heat source that your health care provider recommends, such as a moist heat pack or a heating pad. ? Place a towel between your skin and the heat source. ? Leave the heat on for 20-30 minutes. ? Remove the heat if your skin turns bright red. This is especially important if you are unable to feel pain, heat, or cold. You may have a greater risk of getting burned.  Move your fingers or toes below the painful joint often. You can avoid stiffness and lessen swelling by doing this.  If possible, raise (elevate) the painful joint above  the level of your heart while you are sitting or lying down. To do this, try putting a few pillows under the painful joint. Activity  Rest the painful joint for as long as directed. Do not do anything that causes or worsens pain.  Begin exercising or stretching the affected area, as told by your health care provider. Ask your health care provider what types of exercise are safe for you. If you have an elastic bandage, sling, or splint:  Wear the supportive device as told by your health care provider. Remove it only as told by your health care provider.  Loosen the device if your fingers or toes below the joint tingle, become numb, or turn cold and blue.  Keep the device clean.  Ask your health care provider if you should remove the device before bathing. You may need to cover it with a watertight covering when you take a bath or a shower. General instructions  Take over-the-counter and prescription medicines only as told by your health care provider.  Do not use any products that contain nicotine or tobacco, such as cigarettes and e-cigarettes. If you need help quitting, ask your health care provider.  Keep all follow-up visits as told by your health care provider. This is important. Contact a health care provider if:  You have pain that gets worse and does not get better with medicine.  Your joint pain does not improve within 3 days.  You have increased bruising or swelling.    You have a fever.  You lose 10 lb (4.5 kg) or more without trying. Get help right away if:  You cannot move the joint.  Your fingers or toes tingle, become numb, or turn cold and blue.  You have a fever along with a joint that is red, warm, and swollen. Summary  Joint pain (arthralgia) may be caused by many things.  Your health care provider may recommend that you take pain medicine or wear a supportive device like an elastic bandage, sling, or splint.  If your joint pain continues, you may need  tests to diagnose the cause of your joint pain.  Take over-the-counter and prescription medicines only as told by your health care provider. This information is not intended to replace advice given to you by your health care provider. Make sure you discuss any questions you have with your health care provider. Document Revised: 01/24/2017 Document Reviewed: 11/27/2016 Elsevier Patient Education  2020 Elsevier Inc.  

## 2019-03-18 NOTE — Progress Notes (Signed)
Patient: [name] Female    DOB: [birth year]   39 y.o.   MRN: [id number] Visit Date: 03/18/2019  Today's Provider: Mar Daring, PA-C   No chief complaint on file.  Subjective:     Virtual Visit via Video Note  I connected with [name] on 03/18/19 at  8:00 AM EST by a video enabled telemedicine application and verified that I am speaking with the correct person using two identifiers.  Location: Patient: Home Provider: Home office in [place], [place]   I discussed the limitations of evaluation and management by telemedicine and the availability of in person appointments. The patient expressed understanding and agreed to proceed.   HPI    Follow up ER visit  Patient was seen in ER for Anterior Wall chest pain and musculoskeletal pain on 03/10/2019. She was treated for MSK chest pain.. Treatment for this included Toradol 95m.  Pt reports she has not started Toradol since she is taking other pain medications. She reports this condition is Improved.  Patient has been having increasing and worsening multiple joint pains over the last 6 months or so. She has noticed she is waking up with bilateral hand swelling and stiffness. 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 Also has muscle soreness, noticing most in her legs and her back. States she saw Orthopedics yesterday and was told may need to consider rheumatology work up.  ------------------------------------------------------------------------------------    Allergies  Allergen Reactions  . Compazine Other (See Comments)    Dystonia  . Metoclopramide Other (See Comments)    dystonia     Current Outpatient Medications:  .  ALPRAZolam (XANAX) 1 MG tablet, TAKE 1/2 TO 1 TABLET BY MOUTH 2 TIMES A DAY AS NEEDED FOR ANXIETY, Disp: 60 tablet, Rfl: 5 .  baclofen (LIORESAL) 10 MG tablet, Take 10 mg by mouth 2  (two) times daily as needed (migraines). , Disp: , Rfl: 0 .  chlorproMAZINE (THORAZINE) 25 MG tablet, Take 25 mg by mouth every 4 (four) hours as needed (migraines). , Disp: , Rfl:  .  DULoxetine (CYMBALTA) 20 MG capsule, Take 20 mg by mouth at bedtime. , Disp: , Rfl:  .  gabapentin (NEURONTIN) 400 MG capsule, TAKE 1 CAPSULE BY MOUTH TWICE DAILY, Disp: 180 capsule, Rfl: 1 .  HYDROcodone-acetaminophen (NORCO) 10-325 MG tablet, Take 0.5-1 tablets by mouth every 6 (six) hours as needed for severe pain. Max 2 tabs per day, Disp: , Rfl:  .  ketorolac (TORADOL) 10 MG tablet, Take 1 every 6 hours with food as needed for muscle pain., Disp: 12 tablet, Rfl: 0 .  lidocaine (LIDODERM) 5 %, Place 1 patch onto the skin every 12 (twelve) hours. Remove & Discard patch within 12 hours or as directed by MD, Disp: 10 patch, Rfl: 0 .  meloxicam (MOBIC) 7.5 MG tablet, Take 7.5 mg by mouth daily., Disp: , Rfl:  .  metroNIDAZOLE (METROGEL) 0.75 % vaginal gel, Use one applicator intravaginally after sexual intercourse (Patient taking differently: Place 1 Applicatorful vaginally daily as needed (after sexual intercourse). ), Disp: 70 g, Rfl: 3 .  Multiple Vitamin (MULTIVITAMIN WITH MINERALS) TABS tablet, Take 1 tablet by mouth daily., Disp: , Rfl:  .  naloxone (NARCAN) 4 MG/0.1ML LIQD nasal spray kit, Place 1 spray into the nose as needed (opioid overdose)., Disp: , Rfl:  .  omeprazole (PRILOSEC) 20 MG capsule, Take 1  capsule (20 mg total) by mouth at bedtime., Disp: 90 capsule, Rfl: 1 .  OnabotulinumtoxinA (BOTOX IJ), Inject 1 each as directed every 3 (three) months. Alternating with trigger point inj, Disp: , Rfl:  .  polyethylene glycol (MIRALAX / GLYCOLAX) packet, Take 17 g by mouth every other day. , Disp: , Rfl:  .  promethazine (PHENERGAN) 25 MG tablet, Take 1 tablet (25 mg total) by mouth every 8 (eight) hours as needed for nausea or vomiting., Disp: 30 tablet, Rfl: 0 .  valACYclovir (VALTREX) 500 MG tablet, Take 2  tablets (1,000 mg total) by mouth 2 (two) times daily as needed (cold sores)., Disp: 20 tablet, Rfl: 5 .  Vitamin D, Ergocalciferol, (DRISDOL) 1.25 MG (50000 UT) CAPS capsule, Take 1 capsule (50,000 Units total) by mouth every 7 (seven) days., Disp: 12 capsule, Rfl: 3  Review of Systems  Constitutional: Negative.   Respiratory: Negative.   Cardiovascular: Positive for chest pain (left mid clavicular line, tender with palpation). Negative for palpitations and leg swelling.  Musculoskeletal: Positive for arthralgias and myalgias. Negative for back pain, gait problem, joint swelling, neck pain and neck stiffness.  Neurological: Positive for headaches (chronic migraines). Negative for dizziness.    Social History   Tobacco Use  . Smoking status: Former Smoker    Packs/day: 0.50    Years: 15.00    Pack years: 7.50    Types: Cigarettes    Start date: 11/03/1999    Quit date: 09/26/2014    Years since quitting: 4.4  . Smokeless tobacco: Never Used  Substance Use Topics  . Alcohol use: Yes    Alcohol/week: 0.0 standard drinks    Comment: social-rare      Objective:   There were no vitals taken for this visit. There were no vitals filed for this visit.There is no height or weight on file to calculate BMI.   Physical Exam Vitals reviewed.  Constitutional:      General: She is not in acute distress.    Appearance: She is well-developed.  HENT:     Head: Normocephalic and atraumatic.  Eyes:     General: No scleral icterus. Pulmonary:     Effort: Pulmonary effort is normal. No respiratory distress.  Musculoskeletal:     Cervical back: Normal range of motion and neck supple.  Neurological:     Mental Status: She is alert.  Psychiatric:        Mood and Affect: Mood normal.        Behavior: Behavior normal.        Thought Content: Thought content normal.        Judgment: Judgment normal.      No results found for any visits on 03/18/19.     Assessment & Plan    1. Chest  wall pain Suspect MSK. Discussed moist heat. Continue medications as prescribed. May be due to how she is sleeping as she wakes up with it and it improves through day. Will also check labs as below for autoimmune sources. Referral also placed to [facility] for further evaluation of multiple joint and muscle pains.  - CBC - Comprehensive Metabolic Panel (CMET) - ANA,IFA RA Diag Pnl w/rflx Tit/Patn - C-reactive protein - Sed Rate (ESR) - Ambulatory referral to Rheumatology  2. Arthralgia of multiple joints See above medical treatment plan. - CBC - Comprehensive Metabolic Panel (CMET) - ANA,IFA RA Diag Pnl w/rflx Tit/Patn - C-reactive protein - Sed Rate (ESR) - Ambulatory referral to Rheumatology  3.  Bilateral hand swelling See above medical treatment plan. - CBC - Comprehensive Metabolic Panel (CMET) - ANA,IFA RA Diag Pnl w/rflx Tit/Patn - C-reactive protein - Sed Rate (ESR) - Ambulatory referral to Rheumatology  4. Other fatigue Suspected due to increase in Cymbalta. She is prescribed Cymbalta from pain management and has f/u with them tomorrow. She is currently taking the lower dose initially prescribed, which per our record is the 68m capsule.     I discussed the assessment and treatment plan with the patient. The patient was provided an opportunity to ask questions and all were answered. The patient agreed with the plan and demonstrated an understanding of the instructions.   The patient was advised to call back or seek an in-person evaluation if the symptoms worsen or if the condition fails to improve as anticipated.  I provided 15 minutes of non-face-to-face time during this encounter.   JMar Daring PA-C  [facility]

## 2019-03-19 DIAGNOSIS — F418 Other specified anxiety disorders: Secondary | ICD-10-CM | POA: Diagnosis not present

## 2019-03-19 DIAGNOSIS — M47817 Spondylosis without myelopathy or radiculopathy, lumbosacral region: Secondary | ICD-10-CM | POA: Diagnosis not present

## 2019-03-19 DIAGNOSIS — Z79891 Long term (current) use of opiate analgesic: Secondary | ICD-10-CM | POA: Diagnosis not present

## 2019-03-19 DIAGNOSIS — M5416 Radiculopathy, lumbar region: Secondary | ICD-10-CM | POA: Diagnosis not present

## 2019-03-19 DIAGNOSIS — Z79899 Other long term (current) drug therapy: Secondary | ICD-10-CM | POA: Diagnosis not present

## 2019-03-22 ENCOUNTER — Ambulatory Visit: Payer: BC Managed Care – PPO | Admitting: Physician Assistant

## 2019-03-24 ENCOUNTER — Ambulatory Visit (INDEPENDENT_AMBULATORY_CARE_PROVIDER_SITE_OTHER): Payer: BC Managed Care – PPO | Admitting: Psychology

## 2019-03-24 DIAGNOSIS — F410 Panic disorder [episodic paroxysmal anxiety] without agoraphobia: Secondary | ICD-10-CM | POA: Diagnosis not present

## 2019-03-29 DIAGNOSIS — R0789 Other chest pain: Secondary | ICD-10-CM | POA: Diagnosis not present

## 2019-03-29 DIAGNOSIS — M7989 Other specified soft tissue disorders: Secondary | ICD-10-CM | POA: Diagnosis not present

## 2019-03-29 DIAGNOSIS — M2550 Pain in unspecified joint: Secondary | ICD-10-CM | POA: Diagnosis not present

## 2019-03-31 ENCOUNTER — Telehealth: Payer: Self-pay

## 2019-03-31 LAB — SEDIMENTATION RATE: Sed Rate: 27 mm/hr (ref 0–32)

## 2019-03-31 LAB — CBC
Hematocrit: 41.2 % (ref 34.0–46.6)
Hemoglobin: 13.7 g/dL (ref 11.1–15.9)
MCH: 31.5 pg (ref 26.6–33.0)
MCHC: 33.3 g/dL (ref 31.5–35.7)
MCV: 95 fL (ref 79–97)
Platelets: 257 10*3/uL (ref 150–450)
RBC: 4.35 x10E6/uL (ref 3.77–5.28)
RDW: 11.8 % (ref 11.7–15.4)
WBC: 10.8 10*3/uL (ref 3.4–10.8)

## 2019-03-31 LAB — COMPREHENSIVE METABOLIC PANEL
ALT: 13 IU/L (ref 0–32)
AST: 15 IU/L (ref 0–40)
Albumin/Globulin Ratio: 1.8 (ref 1.2–2.2)
Albumin: 4.2 g/dL (ref 3.8–4.8)
Alkaline Phosphatase: 85 IU/L (ref 39–117)
BUN/Creatinine Ratio: 10 (ref 9–23)
BUN: 7 mg/dL (ref 6–24)
Bilirubin Total: 0.2 mg/dL (ref 0.0–1.2)
CO2: 22 mmol/L (ref 20–29)
Calcium: 9.5 mg/dL (ref 8.7–10.2)
Chloride: 101 mmol/L (ref 96–106)
Creatinine, Ser: 0.67 mg/dL (ref 0.57–1.00)
GFR calc Af Amer: 127 mL/min/{1.73_m2} (ref >59)
GFR calc non Af Amer: 110 mL/min/{1.73_m2} (ref >59)
Globulin, Total: 2.3 g/dL (ref 1.5–4.5)
Glucose: 81 mg/dL (ref 65–99)
Potassium: 3.9 mmol/L (ref 3.5–5.2)
Sodium: 140 mmol/L (ref 134–144)
Total Protein: 6.5 g/dL (ref 6.0–8.5)

## 2019-03-31 LAB — ANA,IFA RA DIAG PNL W/RFLX TIT/PATN
ANA Titer 1: NEGATIVE
Cyclic Citrullin Peptide Ab: 3 units (ref 0–19)
Rheumatoid fact SerPl-aCnc: <10 IU/mL (ref 0.0–13.9)

## 2019-03-31 LAB — C-REACTIVE PROTEIN: CRP: 3 mg/L (ref 0–10)

## 2019-03-31 NOTE — Telephone Encounter (Signed)
Pt advised.   Thanks,   -Rand Boller  

## 2019-03-31 NOTE — Telephone Encounter (Signed)
-----   Message from [name], PA-C sent at 03/31/2019  9:43 AM EST ----- ANA and RF panel is negative.

## 2019-04-06 ENCOUNTER — Encounter: Payer: Self-pay | Admitting: Physician Assistant

## 2019-04-23 DIAGNOSIS — M47817 Spondylosis without myelopathy or radiculopathy, lumbosacral region: Secondary | ICD-10-CM | POA: Diagnosis not present

## 2019-04-23 DIAGNOSIS — F418 Other specified anxiety disorders: Secondary | ICD-10-CM | POA: Diagnosis not present

## 2019-04-23 DIAGNOSIS — M5416 Radiculopathy, lumbar region: Secondary | ICD-10-CM | POA: Diagnosis not present

## 2019-04-23 DIAGNOSIS — Z79899 Other long term (current) drug therapy: Secondary | ICD-10-CM | POA: Diagnosis not present

## 2019-04-26 DIAGNOSIS — M47817 Spondylosis without myelopathy or radiculopathy, lumbosacral region: Secondary | ICD-10-CM | POA: Diagnosis not present

## 2019-05-03 ENCOUNTER — Ambulatory Visit (INDEPENDENT_AMBULATORY_CARE_PROVIDER_SITE_OTHER): Payer: BC Managed Care – PPO | Admitting: Psychology

## 2019-05-03 DIAGNOSIS — F410 Panic disorder [episodic paroxysmal anxiety] without agoraphobia: Secondary | ICD-10-CM

## 2019-05-07 ENCOUNTER — Ambulatory Visit
Admission: RE | Admit: 2019-05-07 | Discharge: 2019-05-07 | Disposition: A | Discharge status: Home / Self Care | Payer: BC Managed Care – PPO | Attending: Family Medicine | Admitting: Family Medicine

## 2019-05-07 ENCOUNTER — Ambulatory Visit
Admission: RE | Admit: 2019-05-07 | Discharge: 2019-05-07 | Disposition: A | Discharge status: Home / Self Care | Payer: BC Managed Care – PPO | Source: Ambulatory Visit | Attending: Family Medicine | Admitting: Family Medicine

## 2019-05-07 ENCOUNTER — Other Ambulatory Visit: Payer: Self-pay | Admitting: Family Medicine

## 2019-05-07 ENCOUNTER — Other Ambulatory Visit: Payer: Self-pay

## 2019-05-07 DIAGNOSIS — M25551 Pain in right hip: Secondary | ICD-10-CM

## 2019-05-07 DIAGNOSIS — S79911A Unspecified injury of right hip, initial encounter: Secondary | ICD-10-CM | POA: Diagnosis not present

## 2019-05-11 DIAGNOSIS — M5416 Radiculopathy, lumbar region: Secondary | ICD-10-CM | POA: Diagnosis not present

## 2019-05-11 DIAGNOSIS — M791 Myalgia, unspecified site: Secondary | ICD-10-CM | POA: Diagnosis not present

## 2019-05-11 DIAGNOSIS — M47818 Spondylosis without myelopathy or radiculopathy, sacral and sacrococcygeal region: Secondary | ICD-10-CM | POA: Insufficient documentation

## 2019-05-11 DIAGNOSIS — M47817 Spondylosis without myelopathy or radiculopathy, lumbosacral region: Secondary | ICD-10-CM | POA: Diagnosis not present

## 2019-05-11 DIAGNOSIS — Z79899 Other long term (current) drug therapy: Secondary | ICD-10-CM | POA: Diagnosis not present

## 2019-05-18 DIAGNOSIS — M357 Hypermobility syndrome: Secondary | ICD-10-CM | POA: Diagnosis not present

## 2019-05-18 DIAGNOSIS — M2550 Pain in unspecified joint: Secondary | ICD-10-CM | POA: Diagnosis not present

## 2019-05-18 DIAGNOSIS — M791 Myalgia, unspecified site: Secondary | ICD-10-CM | POA: Diagnosis not present

## 2019-05-18 DIAGNOSIS — M5136 Other intervertebral disc degeneration, lumbar region: Secondary | ICD-10-CM | POA: Diagnosis not present

## 2019-05-18 DIAGNOSIS — M47817 Spondylosis without myelopathy or radiculopathy, lumbosacral region: Secondary | ICD-10-CM | POA: Diagnosis not present

## 2019-06-07 ENCOUNTER — Other Ambulatory Visit: Payer: Self-pay

## 2019-06-07 ENCOUNTER — Ambulatory Visit
Admission: RE | Admit: 2019-06-07 | Discharge: 2019-06-07 | Disposition: A | Discharge status: Home / Self Care | Payer: BC Managed Care – PPO | Source: Ambulatory Visit | Attending: Urology | Admitting: Urology

## 2019-06-07 ENCOUNTER — Encounter: Payer: Self-pay | Admitting: Urology

## 2019-06-07 ENCOUNTER — Encounter: Payer: Self-pay | Admitting: Physician Assistant

## 2019-06-07 ENCOUNTER — Ambulatory Visit
Admission: RE | Admit: 2019-06-07 | Discharge: 2019-06-07 | Disposition: A | Discharge status: Home / Self Care | Payer: BC Managed Care – PPO | Attending: Urology | Admitting: Urology

## 2019-06-07 ENCOUNTER — Ambulatory Visit: Payer: BC Managed Care – PPO | Admitting: Physician Assistant

## 2019-06-07 VITALS — BP 117/81 | HR 142 | Ht 67.0 in | Wt 200.0 lb

## 2019-06-07 DIAGNOSIS — R109 Unspecified abdominal pain: Secondary | ICD-10-CM

## 2019-06-07 DIAGNOSIS — R350 Frequency of micturition: Secondary | ICD-10-CM | POA: Insufficient documentation

## 2019-06-07 DIAGNOSIS — N200 Calculus of kidney: Secondary | ICD-10-CM | POA: Insufficient documentation

## 2019-06-07 DIAGNOSIS — Z87442 Personal history of urinary calculi: Secondary | ICD-10-CM | POA: Diagnosis not present

## 2019-06-07 LAB — URINALYSIS, COMPLETE
Bilirubin, UA: NEGATIVE
Glucose, UA: NEGATIVE
Ketones, UA: NEGATIVE
Leukocytes,UA: NEGATIVE
Nitrite, UA: NEGATIVE
Protein,UA: NEGATIVE
Specific Gravity, UA: <1.005 — ABNORMAL LOW (ref 1.005–1.030)
Urobilinogen, Ur: 0.2 mg/dL (ref 0.2–1.0)
pH, UA: 5 (ref 5.0–7.5)

## 2019-06-07 LAB — MICROSCOPIC EXAMINATION

## 2019-06-07 MED ORDER — TAMSULOSIN HCL 0.4 MG PO CAPS: 0.4000 mg | ORAL_CAPSULE | Freq: Every day | ORAL | 0 refills | Status: AC

## 2019-06-07 NOTE — Patient Instructions (Signed)
1. Start daily Flomax and stay well hydrated. 2. We will get your CT done this week and I will call you with your results. 3. Contact our office or proceed to the Emergency Department if you develop uncontrollable pain, nausea, vomiting, fever, or chills.

## 2019-06-07 NOTE — Progress Notes (Signed)
06/07/2019 2:10 PM   [name] 11/04/1980 [id number]  CC: RLQ pain  HPI: [name] is a 39 y.o. female with PMH nephrolithiasis who presents today for evaluation of possible acute stone episode.  She was seen most recently by Dr. Erlene Quan on 08/11/2018 for annual stone follow-up.  Today, she reports having passed a small stone or fragment 4 days ago.  Yesterday, she developed acute onset RLQ cramping associated with chills and fatigue.  The pain faded on its own but came back this morning.  She also reports nausea, urinary frequency, and mild dysuria.  She denies fever, vomiting, and gross hematuria.  She has a pain contract in place and used some of her pain medications at home to treat her pain with success.  KUB today with bilateral nephrolithiasis, consistent with prior scans and without evidence of ureteral calculi.  In-office UA today positive for 3+ blood; urine microscopy with 3-10 RBCs/HPF.   PMH: Past Medical History:  Diagnosis Date  . Acute pyelonephritis 06/29/2012  . Anxiety   . Arthritis   . Asthma    AS A CHILD  . Complication of anesthesia    TROUBLE URINATING IN THE PAST  . Gallstones   . GERD (gastroesophageal reflux disease)   . History of kidney stones   . Kidney stones    bilateral  . Migraine   . Pelvic pain in female   . PONV (postoperative nausea and vomiting)    WITH HYSTERECTOMY ONLY  . Pyelonephritis 2015  . Renal mass 2017   right. considered a normal cyst on kidney.  . Tobacco abuse     Surgical History: Past Surgical History:  Procedure Laterality Date  . ABDOMINAL HYSTERECTOMY  2006  . ANKLE ARTHROSCOPY Left 2017  . ANKLE ARTHROSCOPY Left 11/24/2015  . APPENDECTOMY  07/26/13  . CAST APPLICATION  12/28/1592   Procedure: CAST APPLICATION;  Surgeon: Johnn Hai, MD;  Location: [facility];  Service: Orthopedics;  Laterality: Left;  . CHOLECYSTECTOMY  03/11/2011   Procedure: LAPAROSCOPIC CHOLECYSTECTOMY WITH INTRAOPERATIVE  CHOLANGIOGRAM;  Surgeon: Gayland Curry, MD;  Location: [place];  Service: General;  Laterality: N/A;  . CHOLECYSTECTOMY, LAPAROSCOPIC    . CYSTOSCOPY/URETEROSCOPY/HOLMIUM LASER/STENT PLACEMENT Left 06/18/2016   Procedure: CYSTOSCOPY/URETEROSCOPY/HOLMIUM LASER/STENT PLACEMENT;  Surgeon: Hollice Espy, MD;  Location: [facility];  Service: Urology;  Laterality: Left;  . LAPAROSCOPIC OVARIAN CYSTECTOMY Right 09/02/2017   Procedure: LAPAROSCOPIC OVARIAN CYSTECTOMY;  Surgeon: Gae Dry, MD;  Location: [facility];  Service: Gynecology;  Laterality: Right;  . LITHOTRIPSY     x 4  . ORIF ANKLE FRACTURE  12/25/2011   Procedure: OPEN REDUCTION INTERNAL FIXATION (ORIF) ANKLE FRACTURE;  Surgeon: Johnn Hai, MD;  Location: [facility];  Service: Orthopedics;  Laterality: Left;  . TONSILLECTOMY      Home Medications:  Allergies as of 06/07/2019      Reactions   Compazine Other (See Comments)   Dystonia   Metoclopramide Other (See Comments)   dystonia      Medication List       Accurate as of June 07, 2019  2:10 PM. If you have any questions, ask your nurse or doctor.        ALPRAZolam 1 MG tablet Commonly known as: XANAX TAKE 1/2 TO 1 TABLET BY MOUTH 2 TIMES A DAY AS NEEDED FOR ANXIETY   baclofen 10 MG tablet Commonly known as: LIORESAL Take 10 mg by mouth 2 (two) times daily as needed (migraines).  BOTOX IJ Inject 1 each as directed every 3 (three) months. Alternating with trigger point inj   chlorproMAZINE 25 MG tablet Commonly known as: THORAZINE Take 25 mg by mouth every 4 (four) hours as needed (migraines).   DULoxetine 20 MG capsule Commonly known as: CYMBALTA Take 20 mg by mouth at bedtime.   gabapentin 400 MG capsule Commonly known as: NEURONTIN TAKE 1 CAPSULE BY MOUTH TWICE DAILY   HYDROcodone-acetaminophen 10-325 MG tablet Commonly known as: NORCO Take 0.5-1 tablets by mouth every 6 (six) hours as needed for severe pain. Max 2 tabs per day   lidocaine 5 % Commonly  known as: Lidoderm Place 1 patch onto the skin every 12 (twelve) hours. Remove & Discard patch within 12 hours or as directed by MD   meloxicam 7.5 MG tablet Commonly known as: MOBIC Take 7.5 mg by mouth daily.   metroNIDAZOLE 0.75 % vaginal gel Commonly known as: METROGEL Use one applicator intravaginally after sexual intercourse   multivitamin with minerals Tabs tablet Take 1 tablet by mouth daily.   Narcan 4 MG/0.1ML Liqd nasal spray kit Generic drug: naloxone Place 1 spray into the nose as needed (opioid overdose).   omeprazole 20 MG capsule Commonly known as: PRILOSEC Take 1 capsule (20 mg total) by mouth at bedtime.   polyethylene glycol 17 g packet Commonly known as: MIRALAX / GLYCOLAX Take 17 g by mouth every other day.   promethazine 25 MG tablet Commonly known as: PHENERGAN Take 1 tablet (25 mg total) by mouth every 8 (eight) hours as needed for nausea or vomiting.   valACYclovir 500 MG tablet Commonly known as: VALTREX Take 2 tablets (1,000 mg total) by mouth 2 (two) times daily as needed (cold sores).   Vitamin D (Ergocalciferol) 1.25 MG (50000 UNIT) Caps capsule Commonly known as: DRISDOL Take 1 capsule (50,000 Units total) by mouth every 7 (seven) days.   Xtampza ER 18 MG C12a Generic drug: oxyCODONE ER Take 1 capsule by mouth 2 (two) times daily.       Allergies:  Allergies  Allergen Reactions  . Compazine Other (See Comments)    Dystonia  . Metoclopramide Other (See Comments)    dystonia    Family History: Family History  Problem Relation Age of Onset  . Diabetes Mother   . Diabetes Father   . Heart failure Father   . Hypertension Father   . Pulmonary fibrosis Father   . Nephrolithiasis Father   . Kidney cancer Neg Hx   . Bladder Cancer Neg Hx     Social History:   reports that she quit smoking about 4 years ago. Her smoking use included cigarettes. She started smoking about 19 years ago. She has a 7.50 pack-year smoking history. She  has never used smokeless tobacco. She reports current alcohol use. She reports that she does not use drugs.  Physical Exam: BP 117/81   Pulse (!) 142   Ht '5\' 7"'  (1.702 m)   Wt 200 lb (90.7 kg)   BMI 31.32 kg/m   Constitutional:  Alert and oriented, no acute distress, nontoxic appearing HEENT: [place], AT Cardiovascular: No clubbing, cyanosis, or edema Respiratory: Normal respiratory effort, no increased work of breathing Skin: No rashes, bruises or suspicious lesions Neurologic: Grossly intact, no focal deficits, moving all 4 extremities Psychiatric: Normal mood and affect  Laboratory Data: Results for orders placed or performed in visit on 06/07/19  Microscopic Examination   URINE  Result Value Ref Range   WBC, UA 0-5  0 - 5 /hpf   RBC 3-10 (A) 0 - 2 /hpf   Epithelial Cells (non renal) 0-10 0 - 10 /hpf   Mucus, UA Present (A) Not Estab.   Bacteria, UA Few (A) None seen/Few  Urinalysis, Complete  Result Value Ref Range   Specific Gravity, UA <1.005 (L) 1.005 - 1.030   pH, UA 5.0 5.0 - 7.5   Color, UA Yellow Yellow   Appearance Ur Clear Clear   Leukocytes,UA Negative Negative   Protein,UA Negative Negative/Trace   Glucose, UA Negative Negative   Ketones, UA Negative Negative   RBC, UA 3+ (A) Negative   Bilirubin, UA Negative Negative   Urobilinogen, Ur 0.2 0.2 - 1.0 mg/dL   Nitrite, UA Negative Negative   Microscopic Examination See below:    Pertinent Imaging: KUB, 06/07/2019: CLINICAL DATA:  Urinary tract calculi, dysuria  EXAM: ABDOMEN - 1 VIEW  COMPARISON:  08/10/2018  FINDINGS: Two supine frontal views of the abdomen and pelvis are obtained. There are bilateral renal calculi unchanged. At least 3 distinct right renal calculi are seen, largest measuring 5 mm overlying twelfth rib. There are at least 4 distinct left renal calculi, largest measuring 4 mm. No evidence of distal ureteral or bladder calculi. Postsurgical changes are seen from prior bowel  resection right mid abdomen. No obstruction or ileus.  IMPRESSION: 1. Stable bilateral renal calculi.   Electronically Signed   By: Randa Ngo M.D.   On: 06/07/2019 20:33  I personally reviewed the images referenced above and note stable bilateral nephrolithiasis with no clear ureteral calculi.  Assessment & Plan:   1. Flank pain with history of urolithiasis 39 year old female with a history of bilateral nephrolithiasis presents with a 4-day history of stone passage with subsequent severe RLQ pain, chills, nausea, and fatigue without fever, vomiting, or gross hematuria.  So far, pain has been well managed with already prescribed narcotics.  UA notable for microscopic hematuria suggestive of continued stone episode.  Will order CT stone study for further evaluation given no ureteral stones visualized on KUB.  Will send urine for culture.  We will start patient on Flomax 0.4 mg daily for MET and contact patient with her CT scan results.  Counseled her to contact her office immediately or proceed to the emergency room if she develops refractory flank or abdominal pain, fever, chills, nausea, and vomiting.  She expressed understanding. - CULTURE, URINE COMPREHENSIVE - Urinalysis, Complete - CT RENAL STONE STUDY - tamsulosin (FLOMAX) 0.4 MG CAPS capsule; Take 1 capsule (0.4 mg total) by mouth daily.  Dispense: 30 capsule; Refill: 0 - Microscopic Examination   Return if symptoms worsen or fail to improve.  Debroah Loop, PA-C  [facility] [address] [place], [place] [address] [phone]

## 2019-06-07 NOTE — Telephone Encounter (Addendum)
Patient returned call, discussed symptoms. She has been having flank pain and urine frequency for past couple of days. Pain has been increasing. Scheduled appointment for this afternoon with KUB prior.

## 2019-06-08 ENCOUNTER — Emergency Department: Payer: BC Managed Care – PPO

## 2019-06-08 ENCOUNTER — Emergency Department
Admission: EM | Admit: 2019-06-08 | Discharge: 2019-06-08 | Disposition: A | Discharge status: Home / Self Care | Payer: BC Managed Care – PPO | Attending: Emergency Medicine | Admitting: Emergency Medicine

## 2019-06-08 DIAGNOSIS — J45909 Unspecified asthma, uncomplicated: Secondary | ICD-10-CM | POA: Insufficient documentation

## 2019-06-08 DIAGNOSIS — N390 Urinary tract infection, site not specified: Secondary | ICD-10-CM | POA: Diagnosis not present

## 2019-06-08 DIAGNOSIS — Z87891 Personal history of nicotine dependence: Secondary | ICD-10-CM | POA: Insufficient documentation

## 2019-06-08 DIAGNOSIS — R109 Unspecified abdominal pain: Secondary | ICD-10-CM | POA: Diagnosis not present

## 2019-06-08 DIAGNOSIS — Z9049 Acquired absence of other specified parts of digestive tract: Secondary | ICD-10-CM | POA: Insufficient documentation

## 2019-06-08 DIAGNOSIS — R1031 Right lower quadrant pain: Secondary | ICD-10-CM | POA: Diagnosis not present

## 2019-06-08 DIAGNOSIS — Z79899 Other long term (current) drug therapy: Secondary | ICD-10-CM | POA: Insufficient documentation

## 2019-06-08 LAB — BASIC METABOLIC PANEL
Anion gap: 10 (ref 5–15)
BUN: 8 mg/dL (ref 6–20)
CO2: 24 mmol/L (ref 22–32)
Calcium: 9.2 mg/dL (ref 8.9–10.3)
Chloride: 104 mmol/L (ref 98–111)
Creatinine, Ser: 0.55 mg/dL (ref 0.44–1.00)
GFR calc Af Amer: >60 mL/min (ref >60)
GFR calc non Af Amer: >60 mL/min (ref >60)
Glucose, Bld: 80 mg/dL (ref 70–99)
Potassium: 3.9 mmol/L (ref 3.5–5.1)
Sodium: 138 mmol/L (ref 135–145)

## 2019-06-08 LAB — CBC
HCT: 41.2 % (ref 36.0–46.0)
Hemoglobin: 13.4 g/dL (ref 12.0–15.0)
MCH: 31.4 pg (ref 26.0–34.0)
MCHC: 32.5 g/dL (ref 30.0–36.0)
MCV: 96.5 fL (ref 80.0–100.0)
Platelets: 247 10*3/uL (ref 150–400)
RBC: 4.27 MIL/uL (ref 3.87–5.11)
RDW: 12.9 % (ref 11.5–15.5)
WBC: 11.3 10*3/uL — ABNORMAL HIGH (ref 4.0–10.5)
nRBC: 0 % (ref 0.0–0.2)

## 2019-06-08 LAB — URINALYSIS, COMPLETE (UACMP) WITH MICROSCOPIC
Bilirubin Urine: NEGATIVE
Glucose, UA: NEGATIVE mg/dL
Ketones, ur: NEGATIVE mg/dL
Leukocytes,Ua: NEGATIVE
Nitrite: NEGATIVE
Protein, ur: NEGATIVE mg/dL
RBC / HPF: >50 RBC/hpf — ABNORMAL HIGH (ref 0–5)
Specific Gravity, Urine: 1.005 (ref 1.005–1.030)
WBC, UA: >50 WBC/hpf — ABNORMAL HIGH (ref 0–5)
pH: 6 (ref 5.0–8.0)

## 2019-06-08 MED ORDER — SULFAMETHOXAZOLE-TRIMETHOPRIM 800-160 MG PO TABS: 1.0000 | ORAL_TABLET | Freq: Two times a day (BID) | ORAL | 0 refills | Status: DC

## 2019-06-08 MED ORDER — ONDANSETRON HCL 4 MG/2ML IJ SOLN
4.0000 mg | Freq: Once | INTRAMUSCULAR | Status: AC
  Administered 2019-06-08: 4 mg via INTRAVENOUS
  Filled 2019-06-08: qty 2

## 2019-06-08 MED ORDER — MORPHINE SULFATE (PF) 4 MG/ML IV SOLN
4.0000 mg | Freq: Once | INTRAVENOUS | Status: AC
  Administered 2019-06-08: 4 mg via INTRAVENOUS
  Filled 2019-06-08: qty 1

## 2019-06-08 MED ORDER — KETOROLAC TROMETHAMINE 30 MG/ML IJ SOLN
30.0000 mg | Freq: Once | INTRAMUSCULAR | Status: AC
  Administered 2019-06-08: 30 mg via INTRAVENOUS
  Filled 2019-06-08: qty 1

## 2019-06-08 MED ORDER — SULFAMETHOXAZOLE-TRIMETHOPRIM 800-160 MG PO TABS
1.0000 | ORAL_TABLET | Freq: Once | ORAL | Status: AC
  Administered 2019-06-08: 1 via ORAL
  Filled 2019-06-08: qty 1

## 2019-06-08 NOTE — Telephone Encounter (Signed)
Left VM to go to ED return call ASAP to let us know received message.

## 2019-06-08 NOTE — ED Provider Notes (Signed)
[facility] Emergency Department Provider Note   ____________________________________________    I have reviewed the triage vital signs and the nursing notes.   HISTORY  Chief Complaint Flank Pain     HPI [name] is a 39 y.o. female who presents with complaints of right flank pain.  Patient reports symptoms started last week and she did see her urologist and CT was scheduled as an outpatient however symptoms became much worse today.  Was told to come to the emergency department because she was having chills and significant pain with nausea.  Does not think that she has had fever.  Does report a history of kidney stones as well as pyelonephritis in the past.  Some dysuria noted.  Has not take anything for this recently.  Past Medical History:  Diagnosis Date  . Acute pyelonephritis 06/29/2012  . Anxiety   . Arthritis   . Asthma    AS A CHILD  . Complication of anesthesia    TROUBLE URINATING IN THE PAST  . Gallstones   . GERD (gastroesophageal reflux disease)   . History of kidney stones   . Kidney stones    bilateral  . Migraine   . Pelvic pain in female   . PONV (postoperative nausea and vomiting)    WITH HYSTERECTOMY ONLY  . Pyelonephritis 2015  . Renal mass 2017   right. considered a normal cyst on kidney.  . Tobacco abuse     Patient Active Problem List   Diagnosis Date Noted  . Endometriosis 08/27/2017  . Pelvic pain 08/27/2017  . IBS (irritable bowel syndrome) 02/02/2015  . Herpes simplex 02/02/2015  . Asthma, mild intermittent 11/03/2014  . Migraine without aura or status migrainosus 11/03/2014  . Gastroesophageal reflux disease without esophagitis 11/03/2014  . Generalized anxiety disorder 11/03/2014  . Cyst of ovary 08/06/2013  . Appendicolith 07/16/2013  . Chronic constipation 06/30/2012  . History of pyelonephritis 06/29/2012  . Nephrolithiasis 06/28/2012    Past Surgical History:  Procedure Laterality Date   . ABDOMINAL HYSTERECTOMY  2006  . ANKLE ARTHROSCOPY Left 2017  . ANKLE ARTHROSCOPY Left 11/24/2015  . APPENDECTOMY  07/26/13  . CAST APPLICATION  65/53/7482   Procedure: CAST APPLICATION;  Surgeon: Johnn Hai, MD;  [facility];  Service: Orthopedics;  Laterality: Left;  . CHOLECYSTECTOMY  03/11/2011   Procedure: LAPAROSCOPIC CHOLECYSTECTOMY WITH INTRAOPERATIVE CHOLANGIOGRAM;  Surgeon: Gayland Curry, MD;  Location: [place];  Service: General;  Laterality: N/A;  . CHOLECYSTECTOMY, LAPAROSCOPIC    . CYSTOSCOPY/URETEROSCOPY/HOLMIUM LASER/STENT PLACEMENT Left 06/18/2016   Procedure: CYSTOSCOPY/URETEROSCOPY/HOLMIUM LASER/STENT PLACEMENT;  Surgeon: Hollice Espy, MD;  Location: [facility];  Service: Urology;  Laterality: Left;  . LAPAROSCOPIC OVARIAN CYSTECTOMY Right 09/02/2017   Procedure: LAPAROSCOPIC OVARIAN CYSTECTOMY;  Surgeon: Gae Dry, MD;  Location: [facility];  Service: Gynecology;  Laterality: Right;  . LITHOTRIPSY     x 4  . ORIF ANKLE FRACTURE  12/25/2011   Procedure: OPEN REDUCTION INTERNAL FIXATION (ORIF) ANKLE FRACTURE;  Surgeon: Johnn Hai, MD;  [facility];  Service: Orthopedics;  Laterality: Left;  . TONSILLECTOMY      Prior to Admission medications   Medication Sig Start Date End Date Taking? Authorizing Provider  ALPRAZolam Duanne Moron) 1 MG tablet TAKE 1/2 TO 1 TABLET BY MOUTH 2 TIMES A DAY AS NEEDED FOR ANXIETY 11/25/18   Mar Daring, PA-C  baclofen (LIORESAL) 10 MG tablet Take 10 mg by mouth 2 (two) times daily  as needed (migraines).  05/15/16   [provider]  chlorproMAZINE (THORAZINE) 25 MG tablet Take 25 mg by mouth every 4 (four) hours as needed (migraines).     [provider]  DULoxetine (CYMBALTA) 20 MG capsule Take 20 mg by mouth at bedtime.     [provider]  gabapentin (NEURONTIN) 400 MG capsule TAKE 1 CAPSULE BY MOUTH TWICE DAILY 11/25/18   Mar Daring, PA-C  HYDROcodone-acetaminophen (NORCO) 10-325 MG  tablet Take 0.5-1 tablets by mouth every 6 (six) hours as needed for severe pain. Max 2 tabs per day    [provider]  lidocaine (LIDODERM) 5 % Place 1 patch onto the skin every 12 (twelve) hours. Remove & Discard patch within 12 hours or as directed by MD 01/14/19 01/14/20  Johnn Hai, PA-C  meloxicam (MOBIC) 7.5 MG tablet Take 7.5 mg by mouth daily.    [provider]  metroNIDAZOLE (METROGEL) 0.75 % vaginal gel Use one applicator intravaginally after sexual intercourse 03/11/18   Mar Daring, PA-C  Multiple Vitamin (MULTIVITAMIN WITH MINERALS) TABS tablet Take 1 tablet by mouth daily.    [provider]  naloxone [facility]) 4 MG/0.1ML LIQD nasal spray kit Place 1 spray into the nose as needed (opioid overdose).    [provider]  omeprazole (PRILOSEC) 20 MG capsule Take 1 capsule (20 mg total) by mouth at bedtime. 01/16/19   Mar Daring, PA-C  OnabotulinumtoxinA (BOTOX IJ) Inject 1 each as directed every 3 (three) months. Alternating with trigger point inj    [provider]  polyethylene glycol (MIRALAX / GLYCOLAX) packet Take 17 g by mouth every other day.     [provider]  promethazine (PHENERGAN) 25 MG tablet Take 1 tablet (25 mg total) by mouth every 8 (eight) hours as needed for nausea or vomiting. 11/03/18   Mar Daring, PA-C  sulfamethoxazole-trimethoprim (BACTRIM DS) 800-160 MG tablet Take 1 tablet by mouth 2 (two) times daily. 06/08/19   Lavonia Drafts, MD  tamsulosin (FLOMAX) 0.4 MG CAPS capsule Take 1 capsule (0.4 mg total) by mouth daily. 06/07/19 07/07/19  Vaillancourt, Aldona Bar, PA-C  valACYclovir (VALTREX) 500 MG tablet Take 2 tablets (1,000 mg total) by mouth 2 (two) times daily as needed (cold sores). 03/11/18   Mar Daring, PA-C  Vitamin D, Ergocalciferol, (DRISDOL) 1.25 MG (50000 UT) CAPS capsule Take 1 capsule (50,000 Units total) by mouth every 7 (seven) days. 11/30/18   Mar Daring, PA-C  XTAMPZA ER 18 MG C12A Take 1 capsule by mouth 2 (two) times daily. 12/31/18   [provider]     Allergies Compazine and Metoclopramide  Family History  Problem Relation Age of Onset  . Diabetes Mother   . Diabetes Father   . Heart failure Father   . Hypertension Father   . Pulmonary fibrosis Father   . Nephrolithiasis Father   . Kidney cancer Neg Hx   . Bladder Cancer Neg Hx     Social History Social History   Tobacco Use  . Smoking status: Former Smoker    Packs/day: 0.50    Years: 15.00    Pack years: 7.50    Types: Cigarettes    Start date: 11/03/1999    Quit date: 09/26/2014    Years since quitting: 4.7  . Smokeless tobacco: Never Used  Substance Use Topics  . Alcohol use: Yes    Alcohol/week: 0.0 standard drinks    Comment: social-rare  . Drug  use: No    Review of Systems  Constitutional: As above Eyes: No visual changes.  ENT: No sore throat. Cardiovascular: Denies chest pain. Respiratory: Denies shortness of breath. Gastrointestinal: As above Genitourinary: As above Musculoskeletal: Negative for back pain. Skin: Negative for rash. Neurological: Negative for headaches or weakness   ____________________________________________   PHYSICAL EXAM:  VITAL SIGNS: ED Triage Vitals  Enc Vitals Group     BP 06/08/19 1247 131/88     Pulse Rate 06/08/19 1247 (!) 116     Resp 06/08/19 1247 17     Temp 06/08/19 1247 98.1 F (36.7 C)     Temp Source 06/08/19 1247 Oral     SpO2 06/08/19 1247 97 %     Weight 06/08/19 1245 90.7 kg (200 lb)     Height 06/08/19 1245 1.702 m ('5\' 7"' )     Head Circumference --      Peak Flow --      Pain Score 06/08/19 1245 8     Pain Loc --      Pain Edu? --      Excl. in [place]? --     Constitutional: Alert and oriented.   Nose: No congestion/rhinnorhea. Mouth/Throat: Mucous membranes are moist.    Cardiovascular: Normal rate, regular rhythm.   Good peripheral circulation. Respiratory: Normal  respiratory effort.  No retractions.  Gastrointestinal: Soft and nontender. No distention.  No CVA tenderness.  Reassuring exam Genitourinary: deferred Musculoskeletal:  Warm and well perfused Neurologic:  Normal speech and language. No gross focal neurologic deficits are appreciated.  Skin:  Skin is warm, dry and intact. No rash noted. Psychiatric: Mood and affect are normal. Speech and behavior are normal.  ____________________________________________   LABS (all labs ordered are listed, but only abnormal results are displayed)  Labs Reviewed  URINALYSIS, COMPLETE (UACMP) WITH MICROSCOPIC - Abnormal; Notable for the following components:      Result Value   Color, Urine YELLOW (*)    APPearance CLEAR (*)    Hgb urine dipstick LARGE (*)    RBC / HPF >50 (*)    WBC, UA >50 (*)    Bacteria, UA RARE (*)    All other components within normal limits  CBC - Abnormal; Notable for the following components:   WBC 11.3 (*)    All other components within normal limits  BASIC METABOLIC PANEL   ____________________________________________  EKG  None ____________________________________________  RADIOLOGY  CT renal stone study negative for ureterolithiasis. ____________________________________________   PROCEDURES  Procedure(s) performed: No  Procedures   Critical Care performed: No ____________________________________________   INITIAL IMPRESSION / ASSESSMENT AND PLAN / ED COURSE  Pertinent labs & imaging results that were available during my care of the patient were reviewed by me and considered in my medical decision making (see chart for details).  Patient presents with flank pain, description of chills, positive white blood cells in urine and rare bacteria.  Given her history unfortunately we will need to obtain CT scan to rule out infected kidney stone which she says she has had before.  Will treat with IV morphine, IV Zofran for pain.  CT scan negative for utero  lithiasis, will treat with antibiotics.  Patient is feeling much better after treatment, outpatient follow-up as needed, return precautions discussed    ____________________________________________   FINAL CLINICAL IMPRESSION(S) / ED DIAGNOSES  Final diagnoses:  Lower urinary tract infectious disease        Note:  This document was prepared using Dragon  voice recognition software and may include unintentional dictation errors.   Lavonia Drafts, MD 06/08/19 2107

## 2019-06-08 NOTE — ED Triage Notes (Signed)
Pt c/o right flank/RLQ since last week and was seen by PCP for possible kidney stones but if got to feeling worse to come to the ED., states she is having a lot chills and pain with nausea. Hx of kidney stones as well as pyelonephritis

## 2019-06-08 NOTE — ED Notes (Signed)
Pt verbalized understanding of discharge instructions. NAD at this time. 

## 2019-06-11 LAB — CULTURE, URINE COMPREHENSIVE

## 2019-06-16 ENCOUNTER — Ambulatory Visit: Payer: BC Managed Care – PPO

## 2019-06-17 ENCOUNTER — Ambulatory Visit (INDEPENDENT_AMBULATORY_CARE_PROVIDER_SITE_OTHER): Payer: BC Managed Care – PPO | Admitting: Psychology

## 2019-06-17 DIAGNOSIS — F410 Panic disorder [episodic paroxysmal anxiety] without agoraphobia: Secondary | ICD-10-CM | POA: Diagnosis not present

## 2019-06-23 DIAGNOSIS — G43719 Chronic migraine without aura, intractable, without status migrainosus: Secondary | ICD-10-CM | POA: Diagnosis not present

## 2019-06-25 DIAGNOSIS — M25551 Pain in right hip: Secondary | ICD-10-CM | POA: Diagnosis not present

## 2019-06-25 DIAGNOSIS — M5416 Radiculopathy, lumbar region: Secondary | ICD-10-CM | POA: Diagnosis not present

## 2019-06-25 DIAGNOSIS — M47817 Spondylosis without myelopathy or radiculopathy, lumbosacral region: Secondary | ICD-10-CM | POA: Diagnosis not present

## 2019-06-25 DIAGNOSIS — Z79899 Other long term (current) drug therapy: Secondary | ICD-10-CM | POA: Diagnosis not present

## 2019-06-25 DIAGNOSIS — M25552 Pain in left hip: Secondary | ICD-10-CM | POA: Diagnosis not present

## 2019-06-30 ENCOUNTER — Other Ambulatory Visit: Payer: Self-pay | Admitting: Physician Assistant

## 2019-06-30 DIAGNOSIS — F5101 Primary insomnia: Secondary | ICD-10-CM

## 2019-06-30 DIAGNOSIS — F411 Generalized anxiety disorder: Secondary | ICD-10-CM

## 2019-06-30 NOTE — Telephone Encounter (Signed)
Requested medication (s) are due for refill today: yes  Requested medication (s) are on the active medication list: yes  Last refill:  05/22/2019  Future visit scheduled: no  Notes to clinic:  this refill cannot be delegated    Requested Prescriptions  Pending Prescriptions Disp Refills   ALPRAZolam (XANAX) 1 MG tablet [Pharmacy Med Name: ALPRAZOLAM 1 MG TAB] 60 tablet     Sig: TAKE 1/2 TO 1 TABLET BY MOUTH TWICE DAILY AS NEEDED FOR ANXIETY      Not Delegated - Psychiatry:  Anxiolytics/Hypnotics Failed - 06/30/2019  9:07 AM      Failed - This refill cannot be delegated      Failed - Urine Drug Screen completed in last 360 days.      Passed - Valid encounter within last 6 months    Recent Outpatient Visits           3 months ago Chest wall pain   [facility] Joycelyn Man [place], [place]   7 months ago Annual physical exam   [facility] [place], Alessandra [place], [place]   1 year ago Dysuria   [facility] Osvaldo [place], [place]   1 year ago Trochanteric bursitis of both hips   [facility] [place], Alessandra [place], [place]   1 year ago Encounter for weight loss counseling   [facility], Alessandra [place], [place]       Future Appointments             In 1 month Vanna Scotland, MD [facility]

## 2019-07-13 DIAGNOSIS — M533 Sacrococcygeal disorders, not elsewhere classified: Secondary | ICD-10-CM | POA: Diagnosis not present

## 2019-07-19 ENCOUNTER — Ambulatory Visit: Payer: BC Managed Care – PPO | Admitting: Physician Assistant

## 2019-07-19 NOTE — Progress Notes (Deleted)
     Established patient visit   Patient: [name]   DOB: May 23, 1980   39 y.o. Female  MRN: [id number] Visit Date: 07/19/2019  Today's healthcare provider: Mar Daring, PA-C   No chief complaint on file.  Subjective    HPI ***  {Show patient history (optional):23778::" "}   Medications: Outpatient Medications Prior to Visit  Medication Sig  . ALPRAZolam (XANAX) 1 MG tablet TAKE 1/2 TO 1 TABLET BY MOUTH TWICE DAILY AS NEEDED FOR ANXIETY  . baclofen (LIORESAL) 10 MG tablet Take 10 mg by mouth 2 (two) times daily as needed (migraines).   . chlorproMAZINE (THORAZINE) 25 MG tablet Take 25 mg by mouth every 4 (four) hours as needed (migraines).   . DULoxetine (CYMBALTA) 20 MG capsule Take 20 mg by mouth at bedtime.   . gabapentin (NEURONTIN) 400 MG capsule TAKE 1 CAPSULE BY MOUTH TWICE DAILY  . HYDROcodone-acetaminophen (NORCO) 10-325 MG tablet Take 0.5-1 tablets by mouth every 6 (six) hours as needed for severe pain. Max 2 tabs per day  . lidocaine (LIDODERM) 5 % Place 1 patch onto the skin every 12 (twelve) hours. Remove & Discard patch within 12 hours or as directed by MD  . meloxicam (MOBIC) 7.5 MG tablet Take 7.5 mg by mouth daily.  . metroNIDAZOLE (METROGEL) 0.75 % vaginal gel Use one applicator intravaginally after sexual intercourse  . Multiple Vitamin (MULTIVITAMIN WITH MINERALS) TABS tablet Take 1 tablet by mouth daily.  . naloxone (NARCAN) 4 MG/0.1ML LIQD nasal spray kit Place 1 spray into the nose as needed (opioid overdose).  Marland Kitchen omeprazole (PRILOSEC) 20 MG capsule Take 1 capsule (20 mg total) by mouth at bedtime.  . OnabotulinumtoxinA (BOTOX IJ) Inject 1 each as directed every 3 (three) months. Alternating with trigger point inj  . polyethylene glycol (MIRALAX / GLYCOLAX) packet Take 17 g by mouth every other day.   . promethazine (PHENERGAN) 25 MG tablet Take 1 tablet (25 mg total) by mouth every 8 (eight) hours as needed for nausea or vomiting.  .  sulfamethoxazole-trimethoprim (BACTRIM DS) 800-160 MG tablet Take 1 tablet by mouth 2 (two) times daily.  . valACYclovir (VALTREX) 500 MG tablet Take 2 tablets (1,000 mg total) by mouth 2 (two) times daily as needed (cold sores).  . Vitamin D, Ergocalciferol, (DRISDOL) 1.25 MG (50000 UT) CAPS capsule Take 1 capsule (50,000 Units total) by mouth every 7 (seven) days.  Marland Kitchen XTAMPZA ER 18 MG C12A Take 1 capsule by mouth 2 (two) times daily.   No facility-administered medications prior to visit.    Review of Systems  {Heme  Chem  Endocrine  Serology  Results Review (optional):23779::" "}  Objective    There were no vitals taken for this visit. {Show previous vital signs (optional):23777::" "}  Physical Exam  ***  No results found for any visits on 07/19/19.  Assessment & Plan     ***  No follow-ups on file.      {provider attestation***:1}   Rubye Beach  [facility] [phone] (phone) [phone] (fax)  [place]

## 2019-07-20 ENCOUNTER — Other Ambulatory Visit: Payer: Self-pay | Admitting: Physician Assistant

## 2019-07-20 DIAGNOSIS — K219 Gastro-esophageal reflux disease without esophagitis: Secondary | ICD-10-CM

## 2019-07-23 ENCOUNTER — Ambulatory Visit: Payer: BC Managed Care – PPO | Admitting: Physician Assistant

## 2019-07-24 DIAGNOSIS — S59802A Other specified injuries of left elbow, initial encounter: Secondary | ICD-10-CM | POA: Diagnosis not present

## 2019-07-27 ENCOUNTER — Ambulatory Visit: Payer: BC Managed Care – PPO | Admitting: Physician Assistant

## 2019-07-27 DIAGNOSIS — S52124D Nondisplaced fracture of head of right radius, subsequent encounter for closed fracture with routine healing: Secondary | ICD-10-CM | POA: Diagnosis not present

## 2019-07-29 ENCOUNTER — Ambulatory Visit (INDEPENDENT_AMBULATORY_CARE_PROVIDER_SITE_OTHER): Payer: BC Managed Care – PPO | Admitting: Psychology

## 2019-07-29 DIAGNOSIS — F410 Panic disorder [episodic paroxysmal anxiety] without agoraphobia: Secondary | ICD-10-CM

## 2019-08-04 DIAGNOSIS — G43719 Chronic migraine without aura, intractable, without status migrainosus: Secondary | ICD-10-CM | POA: Diagnosis not present

## 2019-08-04 DIAGNOSIS — M542 Cervicalgia: Secondary | ICD-10-CM | POA: Diagnosis not present

## 2019-08-10 ENCOUNTER — Other Ambulatory Visit: Payer: Self-pay

## 2019-08-10 DIAGNOSIS — N200 Calculus of kidney: Secondary | ICD-10-CM

## 2019-08-11 ENCOUNTER — Ambulatory Visit
Admission: RE | Admit: 2019-08-11 | Discharge: 2019-08-11 | Disposition: A | Discharge status: Home / Self Care | Payer: BC Managed Care – PPO | Attending: Urology | Admitting: Urology

## 2019-08-11 ENCOUNTER — Ambulatory Visit
Admission: RE | Admit: 2019-08-11 | Discharge: 2019-08-11 | Disposition: A | Discharge status: Home / Self Care | Payer: BC Managed Care – PPO | Source: Ambulatory Visit | Attending: Urology | Admitting: Urology

## 2019-08-11 ENCOUNTER — Other Ambulatory Visit: Payer: Self-pay

## 2019-08-11 ENCOUNTER — Encounter: Payer: Self-pay | Admitting: Urology

## 2019-08-11 ENCOUNTER — Ambulatory Visit (INDEPENDENT_AMBULATORY_CARE_PROVIDER_SITE_OTHER): Payer: BC Managed Care – PPO | Admitting: Urology

## 2019-08-11 VITALS — BP 105/76 | HR 101

## 2019-08-11 DIAGNOSIS — N200 Calculus of kidney: Secondary | ICD-10-CM | POA: Diagnosis not present

## 2019-08-11 DIAGNOSIS — R350 Frequency of micturition: Secondary | ICD-10-CM

## 2019-08-11 NOTE — Patient Instructions (Signed)
[facility] Instructions [facility]  You will receive a box/kit in the mail that will have a urine jug and instructions in the kit.  When the box arrives you will need to call our office [phone] to schedule a LAB appointment.  You will need to do a 24hour urine and this should be done during the days that our office will be open.  For example any day from Sunday through Thursday.  If you take Vitamin C 159m or greater please stop this 5 days prior to collection.  How to collect the urine sample: On the day you start the urine sample this 1st morning urine should NOT be collected.  For the rest of the day including all night urines should be collected.  On the next morning the 1st urine should be collected and then you will be finished with the urine collections.  You will need to bring the box with you on your LAB appointment day after urine has been collected and all instructions are complete in the box.  Your blood will be drawn and the box will be collected by our Lab employee to be sent off for analysis.  When urine and blood is complete you will need to schedule a follow up appointment for lab results.

## 2019-08-11 NOTE — Progress Notes (Signed)
08/11/19 3:17 PM   [place] [birth year] [id number]  Referring provider: Mar Daring, PA-C [address] [place],  [place] [address] Chief Complaint  Patient presents with  . Nephrolithiasis    HPI: [name] is a 39 y.o. F with PMH nephrolithiasis who presents today for an annual f/u.   On 06/07/19 she reported of passing a small stone or fragment 4 days prior to visit. KUB from visit with bilateral nephrolithiasis, consistent with prior scans and without evidence of ureteral calculi.  In-office UA positive for 3+ blood; urine microscopy with 3-10 RBCs/HPF.   Stone analysis shows 5% calcium oxalate dihydrate, 45% calcium oxalate monohydrate, and 50% calcium phosphate.  She has had ESWL x 4 andURS x 1.  She was on potassium citrate in the remote past but this upset her stomach. She is had a 59-DGLO urine metabolic work-up approximately 5 years ago.  KUB today revealed BL punctate stones measuring up to 3 mm. No evidence of ureteral calculi. Awaiting final read from radiologist.   She reports of drinking Sprite (only soda she drinks) and water. She also notes of having migraines.   She quit smoking 5 years ago.   PMH: Past Medical History:  Diagnosis Date  . Acute pyelonephritis 06/29/2012  . Anxiety   . Arthritis   . Asthma    AS A CHILD  . Complication of anesthesia    TROUBLE URINATING IN THE PAST  . Gallstones   . GERD (gastroesophageal reflux disease)   . History of kidney stones   . Kidney stones    bilateral  . Migraine   . Pelvic pain in female   . PONV (postoperative nausea and vomiting)    WITH HYSTERECTOMY ONLY  . Pyelonephritis 2015  . Renal mass 2017   right. considered a normal cyst on kidney.  . Tobacco abuse     Surgical History: Past Surgical History:  Procedure Laterality Date  . ABDOMINAL HYSTERECTOMY  2006  . ANKLE ARTHROSCOPY Left 2017  . ANKLE ARTHROSCOPY Left 11/24/2015  . APPENDECTOMY  07/26/13  .  CAST APPLICATION  75/64/3329   Procedure: CAST APPLICATION;  Surgeon: Johnn Hai, MD;  [facility];  Service: Orthopedics;  Laterality: Left;  . CHOLECYSTECTOMY  03/11/2011   Procedure: LAPAROSCOPIC CHOLECYSTECTOMY WITH INTRAOPERATIVE CHOLANGIOGRAM;  Surgeon: Gayland Curry, MD;  Location: [place];  Service: General;  Laterality: N/A;  . CHOLECYSTECTOMY, LAPAROSCOPIC    . CYSTOSCOPY/URETEROSCOPY/HOLMIUM LASER/STENT PLACEMENT Left 06/18/2016   Procedure: CYSTOSCOPY/URETEROSCOPY/HOLMIUM LASER/STENT PLACEMENT;  Surgeon: Hollice Espy, MD;  Location: [facility];  Service: Urology;  Laterality: Left;  . LAPAROSCOPIC OVARIAN CYSTECTOMY Right 09/02/2017   Procedure: LAPAROSCOPIC OVARIAN CYSTECTOMY;  Surgeon: Gae Dry, MD;  Location: [facility];  Service: Gynecology;  Laterality: Right;  . LITHOTRIPSY     x 4  . ORIF ANKLE FRACTURE  12/25/2011   Procedure: OPEN REDUCTION INTERNAL FIXATION (ORIF) ANKLE FRACTURE;  Surgeon: Johnn Hai, MD;  [facility];  Service: Orthopedics;  Laterality: Left;  . TONSILLECTOMY      Home Medications:  Allergies as of 08/11/2019      Reactions   Compazine Other (See Comments)   Dystonia   Metoclopramide Other (See Comments)   dystonia      Medication List       Accurate as of August 11, 2019  3:17 PM. If you have any questions, ask your nurse or doctor.        STOP  taking these medications   baclofen 10 MG tablet Commonly known as: LIORESAL Stopped by: Hollice Espy, MD   chlorproMAZINE 25 MG tablet Commonly known as: THORAZINE Stopped by: Hollice Espy, MD   gabapentin 400 MG capsule Commonly known as: NEURONTIN Stopped by: Hollice Espy, MD   lidocaine 5 % Commonly known as: Lidoderm Stopped by: Hollice Espy, MD   meloxicam 7.5 MG tablet Commonly known as: MOBIC Stopped by: Hollice Espy, MD   sulfamethoxazole-trimethoprim 800-160 MG tablet Commonly known as: BACTRIM DS Stopped by: Hollice Espy, MD   Xtampza ER 18  MG C12a Generic drug: oxyCODONE ER Stopped by: Hollice Espy, MD     TAKE these medications   ALPRAZolam 1 MG tablet Commonly known as: XANAX TAKE 1/2 TO 1 TABLET BY MOUTH TWICE DAILY AS NEEDED FOR ANXIETY   BOTOX IJ Inject 1 each as directed every 3 (three) months. Alternating with trigger point inj   DULoxetine 20 MG capsule Commonly known as: CYMBALTA Take 20 mg by mouth at bedtime.   HYDROcodone-acetaminophen 10-325 MG tablet Commonly known as: NORCO Take 0.5-1 tablets by mouth every 6 (six) hours as needed for severe pain. Max 2 tabs per day   metroNIDAZOLE 0.75 % vaginal gel Commonly known as: METROGEL Use one applicator intravaginally after sexual intercourse   multivitamin with minerals Tabs tablet Take 1 tablet by mouth daily.   Narcan 4 MG/0.1ML Liqd nasal spray kit Generic drug: naloxone Place 1 spray into the nose as needed (opioid overdose).   omeprazole 20 MG capsule Commonly known as: PRILOSEC TAKE 1 CAPSULE BY MOUTH ONCE DAILY   polyethylene glycol 17 g packet Commonly known as: MIRALAX / GLYCOLAX Take 17 g by mouth every other day.   promethazine 25 MG tablet Commonly known as: PHENERGAN Take 1 tablet (25 mg total) by mouth every 8 (eight) hours as needed for nausea or vomiting.   valACYclovir 500 MG tablet Commonly known as: VALTREX Take 2 tablets (1,000 mg total) by mouth 2 (two) times daily as needed (cold sores).   Vitamin D (Ergocalciferol) 1.25 MG (50000 UNIT) Caps capsule Commonly known as: DRISDOL Take 1 capsule (50,000 Units total) by mouth every 7 (seven) days.       Allergies:  Allergies  Allergen Reactions  . Compazine Other (See Comments)    Dystonia  . Metoclopramide Other (See Comments)    dystonia    Family History: Family History  Problem Relation Age of Onset  . Diabetes Mother   . Diabetes Father   . Heart failure Father   . Hypertension Father   . Pulmonary fibrosis Father   . Nephrolithiasis Father   .  Kidney cancer Neg Hx   . Bladder Cancer Neg Hx     Social History:  reports that she quit smoking about 4 years ago. Her smoking use included cigarettes. She started smoking about 19 years ago. She has a 7.50 pack-year smoking history. She has never used smokeless tobacco. She reports current alcohol use. She reports that she does not use drugs.   Physical Exam: BP 105/76   Pulse (!) 101   Constitutional:  Alert and oriented, No acute distress. HEENT: [place] AT, moist mucus membranes.  Trachea midline, no masses. Cardiovascular: No clubbing, cyanosis, or edema. Respiratory: Normal respiratory effort, no increased work of breathing. Skin: No rashes, bruises or suspicious lesions. Neurologic: Grossly intact, no focal deficits, moving all 4 extremities. Psychiatric: Normal mood and affect.  Laboratory Data:  Lab Results  Component Value Date  CREATININE 0.55 06/08/2019   Urinalysis Microscopic blood   Pertinent Imaging:  I have personally reviewed the images. Please see Epic. Awaiting final read from radiologist.   Assessment & Plan:    1. Bilateral nephrolithiasis KUB today revealed BL punctate stones measuring up to 3 mm   Offered 47-XGXI urine metabolic work-up given that its been quite sometime since her last medical evaluation and has a stone episode at least once per year  Previously unable to tolerate any potassium citrate or litholight due to GI upset  ?? If would benefit from thiazide type medication  2. Microscopic hematuria  UA today revealed microscopic blood  Recommend cystoscopy especially given smoking history  F/u for cystoscopy and [facility] results  [facility] [address] [place], [place] [address] [phone]  I, Lucas Mallow, am acting as a scribe for Dr. Hollice Espy,  I have reviewed the above documentation for accuracy and completeness, and I agree with the above.   Hollice Espy, MD   I  spent 30 total minutes on the day of the encounter including pre-visit review of the medical record, face-to-face time with the patient, and post visit ordering of labs/imaging/tests.

## 2019-08-12 ENCOUNTER — Encounter: Payer: Self-pay | Admitting: Urology

## 2019-08-12 ENCOUNTER — Ambulatory Visit (INDEPENDENT_AMBULATORY_CARE_PROVIDER_SITE_OTHER): Payer: BC Managed Care – PPO | Admitting: Psychology

## 2019-08-12 DIAGNOSIS — F410 Panic disorder [episodic paroxysmal anxiety] without agoraphobia: Secondary | ICD-10-CM | POA: Diagnosis not present

## 2019-08-16 LAB — MICROSCOPIC EXAMINATION

## 2019-08-16 LAB — URINALYSIS, COMPLETE
Bilirubin, UA: NEGATIVE
Glucose, UA: NEGATIVE
Ketones, UA: NEGATIVE
Leukocytes,UA: NEGATIVE
Nitrite, UA: NEGATIVE
Protein,UA: NEGATIVE
Specific Gravity, UA: 1.02 (ref 1.005–1.030)
Urobilinogen, Ur: 0.2 mg/dL (ref 0.2–1.0)
pH, UA: 7.5 (ref 5.0–7.5)

## 2019-08-25 DIAGNOSIS — Z79899 Other long term (current) drug therapy: Secondary | ICD-10-CM | POA: Diagnosis not present

## 2019-08-25 DIAGNOSIS — M47817 Spondylosis without myelopathy or radiculopathy, lumbosacral region: Secondary | ICD-10-CM | POA: Diagnosis not present

## 2019-08-25 DIAGNOSIS — M7061 Trochanteric bursitis, right hip: Secondary | ICD-10-CM | POA: Diagnosis not present

## 2019-08-25 DIAGNOSIS — M5416 Radiculopathy, lumbar region: Secondary | ICD-10-CM | POA: Diagnosis not present

## 2019-08-25 DIAGNOSIS — M7062 Trochanteric bursitis, left hip: Secondary | ICD-10-CM | POA: Diagnosis not present

## 2019-08-25 DIAGNOSIS — F418 Other specified anxiety disorders: Secondary | ICD-10-CM | POA: Diagnosis not present

## 2019-09-06 ENCOUNTER — Ambulatory Visit: Payer: BC Managed Care – PPO | Admitting: Psychology

## 2019-09-10 DIAGNOSIS — M542 Cervicalgia: Secondary | ICD-10-CM | POA: Diagnosis not present

## 2019-09-10 DIAGNOSIS — G43719 Chronic migraine without aura, intractable, without status migrainosus: Secondary | ICD-10-CM | POA: Diagnosis not present

## 2019-09-23 ENCOUNTER — Other Ambulatory Visit: Payer: BC Managed Care – PPO

## 2019-09-23 ENCOUNTER — Other Ambulatory Visit: Payer: Self-pay

## 2019-09-23 DIAGNOSIS — N200 Calculus of kidney: Secondary | ICD-10-CM | POA: Diagnosis not present

## 2019-09-27 ENCOUNTER — Other Ambulatory Visit: Payer: Self-pay | Admitting: Urology

## 2019-10-08 ENCOUNTER — Ambulatory Visit
Admission: RE | Admit: 2019-10-08 | Discharge: 2019-10-08 | Disposition: A | Discharge status: Home / Self Care | Payer: BC Managed Care – PPO | Attending: Urology | Admitting: Urology

## 2019-10-08 ENCOUNTER — Encounter: Payer: Self-pay | Admitting: Urology

## 2019-10-08 ENCOUNTER — Ambulatory Visit
Admission: RE | Admit: 2019-10-08 | Discharge: 2019-10-08 | Disposition: A | Discharge status: Home / Self Care | Payer: BC Managed Care – PPO | Source: Ambulatory Visit | Attending: Urology | Admitting: Urology

## 2019-10-08 ENCOUNTER — Other Ambulatory Visit: Payer: Self-pay

## 2019-10-08 ENCOUNTER — Ambulatory Visit (INDEPENDENT_AMBULATORY_CARE_PROVIDER_SITE_OTHER): Payer: BC Managed Care – PPO | Admitting: Urology

## 2019-10-08 ENCOUNTER — Other Ambulatory Visit: Payer: Self-pay | Admitting: *Deleted

## 2019-10-08 ENCOUNTER — Telehealth: Payer: Self-pay | Admitting: *Deleted

## 2019-10-08 VITALS — BP 124/91 | HR 103 | Temp 98.1°F | Ht 67.0 in | Wt 200.0 lb

## 2019-10-08 DIAGNOSIS — I878 Other specified disorders of veins: Secondary | ICD-10-CM | POA: Diagnosis not present

## 2019-10-08 DIAGNOSIS — N200 Calculus of kidney: Secondary | ICD-10-CM | POA: Diagnosis not present

## 2019-10-08 DIAGNOSIS — R109 Unspecified abdominal pain: Secondary | ICD-10-CM | POA: Diagnosis not present

## 2019-10-08 LAB — URINALYSIS, COMPLETE
Bilirubin, UA: NEGATIVE
Glucose, UA: NEGATIVE
Ketones, UA: NEGATIVE
Leukocytes,UA: NEGATIVE
Nitrite, UA: NEGATIVE
Specific Gravity, UA: <1.005 — ABNORMAL LOW (ref 1.005–1.030)
Urobilinogen, Ur: 0.2 mg/dL (ref 0.2–1.0)
pH, UA: 6 (ref 5.0–7.5)

## 2019-10-08 LAB — MICROSCOPIC EXAMINATION: RBC, Urine: >30 /hpf — AB (ref 0–2)

## 2019-10-08 MED ORDER — KETOROLAC TROMETHAMINE 60 MG/2ML IM SOLN
60.0000 mg | Freq: Once | INTRAMUSCULAR | Status: AC
  Administered 2019-10-08: 60 mg via INTRAMUSCULAR

## 2019-10-08 MED ORDER — SULFAMETHOXAZOLE-TRIMETHOPRIM 800-160 MG PO TABS: 1.0000 | ORAL_TABLET | Freq: Two times a day (BID) | ORAL | 0 refills | Status: DC

## 2019-10-08 MED ORDER — KETOROLAC TROMETHAMINE 10 MG PO TABS: 10.0000 mg | ORAL_TABLET | Freq: Four times a day (QID) | ORAL | 0 refills | Status: DC | PRN

## 2019-10-08 NOTE — Telephone Encounter (Signed)
Patient having right flank pain-called to discuss symptoms will get KUB prior and come to office for evaluation.

## 2019-10-08 NOTE — Progress Notes (Signed)
10/08/2019 8:39 PM   [name] 06-04-1980 [id number]  Referring provider: Mar Daring, PA-C [place],   [id number]  Chief Complaint  Patient presents with  . Nephrolithiasis    HPI: [name] is a 39 y.o. female with nephrolithiasis who presents today with intense right sided pain.  A few days ago, she had the onset of right flank pain to right waist pain.  It has been a 5-6/10 pain.  Lasting for several hours.  She has been taking Flomax, pain medication and increasing her fluids in an attempt to pass the stone.  She has been having associated gross hematuria and nausea.  Patient denies any modifying or aggravating factors.  Patient denies any dysuria or suprapubic/flank pain.  Patient denies any fevers, chills or vomiting.   Her UA > 30 RBC's.  KUB did not identify any migration of previous stones or ureteral stones.   PMH: Past Medical History:  Diagnosis Date  . Acute pyelonephritis 06/29/2012  . Anxiety   . Arthritis   . Asthma    AS A CHILD  . Complication of anesthesia    TROUBLE URINATING IN THE PAST  . Gallstones   . GERD (gastroesophageal reflux disease)   . History of kidney stones   . Kidney stones    bilateral  . Migraine   . Pelvic pain in female   . PONV (postoperative nausea and vomiting)    WITH HYSTERECTOMY ONLY  . Pyelonephritis 2015  . Renal mass 2017   right. considered a normal cyst on kidney.  . Tobacco abuse     Surgical History: Past Surgical History:  Procedure Laterality Date  . ABDOMINAL HYSTERECTOMY  2006  . ANKLE ARTHROSCOPY Left 2017  . ANKLE ARTHROSCOPY Left 11/24/2015  . APPENDECTOMY  07/26/13  . CAST APPLICATION  [id number]/2992   Procedure: CAST APPLICATION;  Surgeon: Johnn Hai, MD;  Location: [facility];  Service: Orthopedics;  Laterality: Left;  . CHOLECYSTECTOMY  03/11/2011   Procedure: LAPAROSCOPIC CHOLECYSTECTOMY WITH INTRAOPERATIVE CHOLANGIOGRAM;  Surgeon: Gayland Curry, MD;  Location: [facility];  Service: General;  Laterality: N/A;  . CHOLECYSTECTOMY, LAPAROSCOPIC    . CYSTOSCOPY/URETEROSCOPY/HOLMIUM LASER/STENT PLACEMENT Left 06/18/2016   Procedure: CYSTOSCOPY/URETEROSCOPY/HOLMIUM LASER/STENT PLACEMENT;  Surgeon: Hollice Espy, MD;  Location: [facility];  Service: Urology;  Laterality: Left;  . LAPAROSCOPIC OVARIAN CYSTECTOMY Right 09/02/2017   Procedure: LAPAROSCOPIC OVARIAN CYSTECTOMY;  Surgeon: Gae Dry, MD;  Location: [facility];  Service: Gynecology;  Laterality: Right;  . LITHOTRIPSY     x 4  . ORIF ANKLE FRACTURE  12/25/2011   Procedure: OPEN REDUCTION INTERNAL FIXATION (ORIF) ANKLE FRACTURE;  Surgeon: Johnn Hai, MD;  Location: [facility];  Service: Orthopedics;  Laterality: Left;  . TONSILLECTOMY      Home Medications:  Allergies as of 10/08/2019      Reactions   Compazine Other (See Comments)   Dystonia   Metoclopramide Other (See Comments)   dystonia      Medication List       Accurate as of October 08, 2019 11:59 PM. If you have any questions, ask your nurse or doctor.        ALPRAZolam 1 MG tablet Commonly known as: XANAX TAKE 1/2 TO 1 TABLET BY MOUTH TWICE DAILY AS NEEDED FOR ANXIETY   BOTOX IJ Inject 1 each as directed every 3 (three) months. Alternating with trigger point inj   DULoxetine 20 MG capsule Commonly known  as: CYMBALTA Take 20 mg by mouth at bedtime.   HYDROcodone-acetaminophen 10-325 MG tablet Commonly known as: NORCO Take 0.5-1 tablets by mouth every 6 (six) hours as needed for severe pain. Max 2 tabs per day   HYDROmorphone HCl ER 16 MG Tb24   ketorolac 10 MG tablet Commonly known as: TORADOL Take 1 tablet (10 mg total) by mouth every 6 (six) hours as needed. Started by: Zara Council, PA-C   metroNIDAZOLE 0.75 % vaginal gel Commonly known as: METROGEL Use one applicator intravaginally after sexual intercourse   multivitamin with minerals Tabs tablet Take 1 tablet by mouth daily.    Narcan 4 MG/0.1ML Liqd nasal spray kit Generic drug: naloxone Place 1 spray into the nose as needed (opioid overdose).   omeprazole 20 MG capsule Commonly known as: PRILOSEC TAKE 1 CAPSULE BY MOUTH ONCE DAILY   polyethylene glycol 17 g packet Commonly known as: MIRALAX / GLYCOLAX Take 17 g by mouth every other day.   promethazine 25 MG tablet Commonly known as: PHENERGAN Take 1 tablet (25 mg total) by mouth every 8 (eight) hours as needed for nausea or vomiting.   sulfamethoxazole-trimethoprim 800-160 MG tablet Commonly known as: BACTRIM DS Take 1 tablet by mouth every 12 (twelve) hours. Started by: Zara Council, PA-C   tamsulosin 0.4 MG Caps capsule Commonly known as: FLOMAX Take 0.4 mg by mouth.   valACYclovir 500 MG tablet Commonly known as: VALTREX Take 2 tablets (1,000 mg total) by mouth 2 (two) times daily as needed (cold sores).   Vitamin D (Ergocalciferol) 1.25 MG (50000 UNIT) Caps capsule Commonly known as: DRISDOL Take 1 capsule (50,000 Units total) by mouth every 7 (seven) days.       Allergies:  Allergies  Allergen Reactions  . Compazine Other (See Comments)    Dystonia  . Metoclopramide Other (See Comments)    dystonia    Family History: Family History  Problem Relation Age of Onset  . Diabetes Mother   . Diabetes Father   . Heart failure Father   . Hypertension Father   . Pulmonary fibrosis Father   . Nephrolithiasis Father   . Kidney cancer Neg Hx   . Bladder Cancer Neg Hx     Social History:  reports that she quit smoking about 5 years ago. Her smoking use included cigarettes. She started smoking about 19 years ago. She has a 7.50 pack-year smoking history. She has never used smokeless tobacco. She reports current alcohol use. She reports that she does not use drugs.  ROS: Pertinent ROS in HPI  Physical Exam: BP (!) 124/91   Pulse (!) 103   Temp 98.1 F (36.7 C)   Ht '5\' 7"'$  (1.702 m)   Wt 200 lb (90.7 kg)   BMI 31.32 kg/m    Constitutional:  Well nourished. Alert and oriented, No acute distress.  Unable to sit still.   HEENT: [place] AT, mask in place.  Trachea midline Cardiovascular: No clubbing, cyanosis, or edema. Respiratory: Normal respiratory effort, no increased work of breathing. GI: Abdomen is soft, non tender, non distended, no abdominal masses. Liver and spleen not palpable.  No hernias appreciated.  Stool sample for occult testing is not indicated.   GU: Right CVA tenderness.  No bladder fullness or masses.   Neurologic: Grossly intact, no focal deficits, moving all 4 extremities. Psychiatric: Normal mood and affect.  Laboratory Data: Lab Results  Component Value Date   WBC 11.3 (H) 06/08/2019   HGB 13.4 06/08/2019  HCT 41.2 06/08/2019   MCV 96.5 06/08/2019   PLT 247 06/08/2019    Lab Results  Component Value Date   CREATININE 0.55 06/08/2019    Lab Results  Component Value Date   HGBA1C 5.1 11/25/2018    Lab Results  Component Value Date   TSH 1.050 11/25/2018       Component Value Date/Time   CHOL 227 (H) 11/25/2018 1404   HDL 64 11/25/2018 1404   CHOLHDL 3.5 11/25/2018 1404   LDLCALC 138 (H) 11/25/2018 1404    Lab Results  Component Value Date   AST 15 03/29/2019   Lab Results  Component Value Date   ALT 13 03/29/2019   Urinalysis Component     Latest Ref Rng & Units 10/08/2019  Specific Gravity, UA     1.005 - 1.030 <1.005 (L)  pH, UA     5.0 - 7.5 6.0  Color, UA     Yellow Red (A)  Appearance Ur     Clear Cloudy (A)  Leukocytes,UA     Negative Negative  Protein,UA     Negative/Trace Trace (A)  Glucose, UA     Negative Negative  Ketones, UA     Negative Negative  RBC, UA     Negative 3+ (A)  Bilirubin, UA     Negative Negative  Urobilinogen, Ur     0.2 - 1.0 mg/dL 0.2  Nitrite, UA     Negative Negative  Microscopic Examination      See below:   Component     Latest Ref Rng & Units 10/08/2019  WBC, UA     0 - 5 /hpf 6-10 (A)  RBC     0 - 2  /hpf >30 (A)  Epithelial Cells (non renal)     0 - 10 /hpf 0-10  Bacteria, UA     None seen/Few Few   I have reviewed the labs.   Pertinent Imaging: CLINICAL DATA:  Onset right flank pain last night.  EXAM: ABDOMEN - 1 VIEW  COMPARISON:  [facility] 08/11/2019.  CT abdomen and pelvis 06/08/2019.  FINDINGS: Punctate calcifications projecting over both kidneys are consistent with nonobstructing stones seen on the prior CT. There are some small phleboliths in the pelvis but no evidence of ureteral stone is identified. Bowel gas pattern is normal.  IMPRESSION: Punctate bilateral renal stones as seen on prior studies. No evidence of ureteral or urinary bladder stone.   Electronically Signed   By: Inge Rise M.D.   On: 10/08/2019 16:03 I have independently reviewed the films.  See HPI.   Assessment & Plan:    1. Right flank pain - 60 mg Toradol given IM - she will contact pain doctor for more pain medication - patient given strainer - prescription given for Toradol 10 mg q 6 hours x 5 days - CT renal stone study ordered - UA is sent for culture - Septra DS prescribed - will adjust if necessary  - Patient is advised that if they should start to experience pain that is not able to be controlled with pain medication, intractable nausea and/or vomiting and/or fevers greater than 103 or shaking chills to contact the office immediately or seek treatment in the emergency department for emergent intervention.    2. Nephrolithiasis Bilateral nephrolithiasis on KUB CT renal stone study pending  No follow-ups on file.  These notes generated with voice recognition software. I apologize for typographical errors.  Royden Purl  [place]  [address]  [place], [place] [address] [phone]

## 2019-10-11 ENCOUNTER — Ambulatory Visit: Admission: RE | Admit: 2019-10-11 | Payer: BC Managed Care – PPO | Source: Ambulatory Visit

## 2019-10-11 LAB — CULTURE, URINE COMPREHENSIVE

## 2019-10-12 ENCOUNTER — Telehealth: Payer: Self-pay | Admitting: Family Medicine

## 2019-10-12 NOTE — Telephone Encounter (Signed)
Unable to leave message

## 2019-10-12 NOTE — Telephone Encounter (Signed)
-----   Message from Harle Battiest, PA-C sent at 10/12/2019  8:50 AM EDT ----- Please let Mrs. Slight know that her urine culture was negative and ask why she did not show for her CT scan yesterday.

## 2019-10-15 ENCOUNTER — Encounter: Payer: Self-pay | Admitting: Family Medicine

## 2019-10-25 DIAGNOSIS — M47817 Spondylosis without myelopathy or radiculopathy, lumbosacral region: Secondary | ICD-10-CM | POA: Diagnosis not present

## 2019-10-25 DIAGNOSIS — F418 Other specified anxiety disorders: Secondary | ICD-10-CM | POA: Diagnosis not present

## 2019-10-25 DIAGNOSIS — M5416 Radiculopathy, lumbar region: Secondary | ICD-10-CM | POA: Diagnosis not present

## 2019-10-25 DIAGNOSIS — Z79891 Long term (current) use of opiate analgesic: Secondary | ICD-10-CM | POA: Diagnosis not present

## 2019-10-25 DIAGNOSIS — G43719 Chronic migraine without aura, intractable, without status migrainosus: Secondary | ICD-10-CM | POA: Diagnosis not present

## 2019-10-25 DIAGNOSIS — Z79899 Other long term (current) drug therapy: Secondary | ICD-10-CM | POA: Diagnosis not present

## 2019-10-28 ENCOUNTER — Telehealth: Payer: Self-pay | Admitting: Urology

## 2019-10-28 NOTE — Telephone Encounter (Signed)
Pt states she was returning someone's call, couldn't remember who it was.  Pt didn't seem aware of her results when I read last message.  Pt then stated she couldn't afford the CT that's why she didn't show.  Please return pt call.

## 2019-11-10 ENCOUNTER — Ambulatory Visit
Admission: RE | Admit: 2019-11-10 | Discharge: 2019-11-10 | Disposition: A | Discharge status: Home / Self Care | Payer: BC Managed Care – PPO | Source: Ambulatory Visit | Attending: Urology | Admitting: Urology

## 2019-11-10 ENCOUNTER — Other Ambulatory Visit: Payer: Self-pay

## 2019-11-10 DIAGNOSIS — Z9049 Acquired absence of other specified parts of digestive tract: Secondary | ICD-10-CM | POA: Diagnosis not present

## 2019-11-10 DIAGNOSIS — R109 Unspecified abdominal pain: Secondary | ICD-10-CM | POA: Insufficient documentation

## 2019-11-10 DIAGNOSIS — N200 Calculus of kidney: Secondary | ICD-10-CM | POA: Diagnosis not present

## 2019-11-10 DIAGNOSIS — R319 Hematuria, unspecified: Secondary | ICD-10-CM | POA: Diagnosis not present

## 2019-11-11 ENCOUNTER — Encounter: Payer: Self-pay | Admitting: Physician Assistant

## 2019-11-15 ENCOUNTER — Encounter: Payer: Self-pay | Admitting: Physician Assistant

## 2019-11-15 ENCOUNTER — Ambulatory Visit: Payer: BC Managed Care – PPO | Admitting: Physician Assistant

## 2019-11-15 ENCOUNTER — Other Ambulatory Visit: Payer: Self-pay

## 2019-11-15 VITALS — BP 125/87 | HR 98 | Temp 99.2°F | Resp 16 | Ht 67.0 in | Wt 235.0 lb

## 2019-11-15 DIAGNOSIS — Z6836 Body mass index (BMI) 36.0-36.9, adult: Secondary | ICD-10-CM | POA: Diagnosis not present

## 2019-11-15 DIAGNOSIS — J9811 Atelectasis: Secondary | ICD-10-CM

## 2019-11-15 DIAGNOSIS — R1011 Right upper quadrant pain: Secondary | ICD-10-CM | POA: Diagnosis not present

## 2019-11-15 NOTE — Progress Notes (Signed)
Established patient visit   Patient: [name]   DOB: 14-Oct-1980   39 y.o. Female  MRN: [id number] Visit Date: 11/15/2019  Today's healthcare provider: Mar Daring, PA-C   Chief Complaint  Patient presents with  . discuss CT results   Subjective    HPI  Follow up for CT scan  The patient was last seen for this 1 weeks ago. She reports that her renal CT scan showed scarring on her lungs. She had questions regarding the results. She has no symptoms. She does have h/o smoking.    Patient Active Problem List   Diagnosis Date Noted  . Endometriosis 08/27/2017  . Pelvic pain 08/27/2017  . IBS (irritable bowel syndrome) 02/02/2015  . Herpes simplex 02/02/2015  . Asthma, mild intermittent 11/03/2014  . Migraine without aura or status migrainosus 11/03/2014  . Gastroesophageal reflux disease without esophagitis 11/03/2014  . Generalized anxiety disorder 11/03/2014  . Cyst of ovary 08/06/2013  . Appendicolith 07/16/2013  . Chronic constipation 06/30/2012  . History of pyelonephritis 06/29/2012  . Nephrolithiasis 06/28/2012   Past Medical History:  Diagnosis Date  . Acute pyelonephritis 06/29/2012  . Anxiety   . Arthritis   . Asthma    AS A CHILD  . Complication of anesthesia    TROUBLE URINATING IN THE PAST  . Gallstones   . GERD (gastroesophageal reflux disease)   . History of kidney stones   . Kidney stones    bilateral  . Migraine   . Pelvic pain in female   . PONV (postoperative nausea and vomiting)    WITH HYSTERECTOMY ONLY  . Pyelonephritis 2015  . Renal mass 2017   right. considered a normal cyst on kidney.  . Tobacco abuse        Medications: Outpatient Medications Prior to Visit  Medication Sig  . ALPRAZolam (XANAX) 1 MG tablet TAKE 1/2 TO 1 TABLET BY MOUTH TWICE DAILY AS NEEDED FOR ANXIETY  . HYDROcodone-acetaminophen (NORCO) 10-325 MG tablet Take 0.5-1 tablets by mouth every 6 (six) hours as needed for severe pain. Max 2 tabs per  day  . metroNIDAZOLE (METROGEL) 0.75 % vaginal gel Use one applicator intravaginally after sexual intercourse  . naloxone (NARCAN) 4 MG/0.1ML LIQD nasal spray kit Place 1 spray into the nose as needed (opioid overdose).  Marland Kitchen omeprazole (PRILOSEC) 20 MG capsule TAKE 1 CAPSULE BY MOUTH ONCE DAILY  . OnabotulinumtoxinA (BOTOX IJ) Inject 1 each as directed every 3 (three) months. Alternating with trigger point inj  . polyethylene glycol (MIRALAX / GLYCOLAX) packet Take 17 g by mouth every other day.   . promethazine (PHENERGAN) 25 MG tablet Take 1 tablet (25 mg total) by mouth every 8 (eight) hours as needed for nausea or vomiting.  . tamsulosin (FLOMAX) 0.4 MG CAPS capsule Take 0.4 mg by mouth.  . valACYclovir (VALTREX) 500 MG tablet Take 2 tablets (1,000 mg total) by mouth 2 (two) times daily as needed (cold sores).  . Vitamin D, Ergocalciferol, (DRISDOL) 1.25 MG (50000 UT) CAPS capsule Take 1 capsule (50,000 Units total) by mouth every 7 (seven) days.  . [DISCONTINUED] DULoxetine (CYMBALTA) 20 MG capsule Take 20 mg by mouth at bedtime.   . [DISCONTINUED] ketorolac (TORADOL) 10 MG tablet Take 1 tablet (10 mg total) by mouth every 6 (six) hours as needed.  . Multiple Vitamin (MULTIVITAMIN WITH MINERALS) TABS tablet Take 1 tablet by mouth daily.  . [DISCONTINUED] HYDROmorphone HCl ER 16 MG TB24  (Patient not taking:  Reported on 11/15/2019)  . [DISCONTINUED] sulfamethoxazole-trimethoprim (BACTRIM DS) 800-160 MG tablet Take 1 tablet by mouth every 12 (twelve) hours. (Patient not taking: Reported on 11/15/2019)   No facility-administered medications prior to visit.    Review of Systems  Constitutional: Negative.   Respiratory: Negative.   Cardiovascular: Negative.   Neurological: Negative.     Last CBC Lab Results  Component Value Date   WBC 11.3 (H) 06/08/2019   HGB 13.4 06/08/2019   HCT 41.2 06/08/2019   MCV 96.5 06/08/2019   MCH 31.4 06/08/2019   RDW 12.9 06/08/2019   PLT 247 40/98/1191    Last metabolic panel Lab Results  Component Value Date   GLUCOSE 80 06/08/2019   NA 138 06/08/2019   K 3.9 06/08/2019   CL 104 06/08/2019   CO2 24 06/08/2019   BUN 8 06/08/2019   CREATININE 0.55 06/08/2019   GFRNONAA >60 06/08/2019   GFRAA >60 06/08/2019   CALCIUM 9.2 06/08/2019   PROT 6.5 03/29/2019   ALBUMIN 4.2 03/29/2019   LABGLOB 2.3 03/29/2019   AGRATIO 1.8 03/29/2019   BILITOT 0.2 03/29/2019   ALKPHOS 85 03/29/2019   AST 15 03/29/2019   ALT 13 03/29/2019   ANIONGAP 10 06/08/2019      Objective    BP 125/87   Pulse 98   Temp 99.2 F (37.3 C)   Resp 16   Ht 5' 7" (1.702 m)   Wt 235 lb (106.6 kg)   BMI 36.81 kg/m  BP Readings from Last 3 Encounters:  11/17/19 125/85  11/15/19 125/87  10/08/19 (!) 124/91   Wt Readings from Last 3 Encounters:  11/15/19 235 lb (106.6 kg)  10/08/19 200 lb (90.7 kg)  06/08/19 200 lb (90.7 kg)      Physical Exam Vitals reviewed.  Constitutional:      General: She is not in acute distress.    Appearance: Normal appearance. She is well-developed. She is obese. She is not ill-appearing or diaphoretic.  Cardiovascular:     Rate and Rhythm: Normal rate and regular rhythm.     Pulses: Normal pulses.     Heart sounds: Normal heart sounds. No murmur heard.  No friction rub. No gallop.   Pulmonary:     Effort: Pulmonary effort is normal. No respiratory distress.     Breath sounds: Normal breath sounds. No wheezing or rales.  Musculoskeletal:     Cervical back: Normal range of motion and neck supple.  Neurological:     Mental Status: She is alert.       No results found for any visits on 11/15/19.  Assessment & Plan     1. Class 2 severe obesity due to excess calories with serious comorbidity and body mass index (BMI) of 36.0 to 36.9 in adult [facility]) Counseled patient on healthy lifestyle modifications including dieting and exercise.   2. Colicky RUQ abdominal pain Has had gallbladder removed but still has symptoms  that are consistent with a dysfunction of the sphincter of Oddi. Discussed continuing to decrease fatty foods from diet. Can follow up with the general surgeon that removed her gallbladder for further considerations.   3. Atelectasis of right lung Noted incidentally on CT abd/pelvis. Discussed could be from previous infection or from smoking. Staying quit smoking can help. Also demonstrated deep breathing exercises she can do that may help as well. Being that she has no symptoms of chronic cough or SOB I do not feel an inhaler would benefit, but this could change  over time. Call if symptoms change.    Return if symptoms worsen or fail to improve.      Reynolds Bowl, PA-C, have reviewed all documentation for this visit. The documentation on 11/18/19 for the exam, diagnosis, procedures, and orders are all accurate and complete.   Rubye Beach  [facility] [phone] (phone) [phone] (fax)  [place]

## 2019-11-15 NOTE — Patient Instructions (Signed)
Atelectasis, Adult  Atelectasis is a collapse of air sacs in the lungs (alveoli). The condition causes all or part of a lung to collapse. Atelectasis is a common problem after surgery. Its severity depends on the size of lung tissue area involved and the underlying cause. When severe, it can lead to shortness of breath and heart problems. Atelectasis can develop suddenly or over a long period of time. Atelectasis that develops over a long period of time (chronic atelectasis) often leads to infection, scarring, and other problems. What are the causes? This condition may be caused by:  Shallow breathing.  Medicines that make breathing more shallow.  A blockage in an airway. Blockages can result from: ? A buildup of mucus. ? A tumor. ? An inhaled object (foreign body). ? Enlarged lymph nodes. ? Fluid in the lungs (pleural effusion). ? A blood clot in the lungs.  Outside pressure on the lung. Pressure can be due to: ? A tumor. ? Fluid in the lungs (pleural effusion). ? Air leaking between the lung and rib cage (pneumothorax). ? Enlarged lymph nodes.  Improper expansion of the lungs. This may occur in newborns because of: ? Prematurity. ? Low oxygen levels. ? Secretions at birth that block the airway. ? Amniotic fluid that goes into the lungs (aspiration). What increases the risk? This condition is more likely to develop in people who:  Have an injury or health problem that makes taking deep breaths difficult or painful.  Have certain infections or diseases, such as pneumonia or cystic fibrosis.  Have had surgery on the chest or abdomen.  Have broken ribs.  Have a tight bandage around their chest.  Have a collapsed lung due to pneumothorax.  Take medicines that decrease the rate of their breathing or how deeply they breathe, like sedatives.  Lie flat for long periods of time. What are the signs or symptoms? Often, there are no symptoms for this condition. When symptoms do  appear, they may include:  Shortness of breath.  Bluish color to the nails, lips, or mouth (cyanosis).  A cough. How is this diagnosed? This condition may be diagnosed based on:  Symptoms.  A physical exam.  A chest X-ray. Sometimes specialized imaging tests are needed to diagnose the condition. How is this treated? Treatment for this condition depends on what caused the condition. Treatment may involve:  Coughing. Coughing helps loosen mucus in the airway.  Chest physiotherapy. This is a treatment to help loosen and clear mucus from the airways. It is done by clapping the chest.  Postural drainage techniques. This treatment involves positioning your body so your head is lower than your chest. It helps mucus drain from your airways.  An incentive spirometer. This is a device that is used to help with taking deeper breaths.  Positive pressure breathing. This is a form of breathing assistance in which air is forced into the lungs when you breathe in (inhale). You may have this treatment if your condition is severe.  Treatment of the underlying condition. Follow these instructions at home:  Take over-the-counter and prescription medicines only as told by your health care provider.  Practice taking relaxed and deep breaths when you are sitting. A good time to practice is when you are watching TV. Take a few deep breaths during each commercial break.  Make sure to lie on your unaffected side when you are lying down. For example, if you have atelectasis in your left lung, lie on your right side. This will help   mucus drain from your airway.  Cough several times a day as told by your health care provider.  Perform chest physiotherapy or postural drainage techniques as told by your health care provider. If necessary, have someone help you.  If you were given a device to help with breathing, use it as told by your health care provider.  Stay as active as possible. Get help right  away if:  Your breathing problems get worse.  You have severe chest pain.  You develop severe coughing.  You cough up blood.  You have a fever.  You have persistent symptoms for more than 2-3 days.  Your symptoms suddenly get worse. This information is not intended to replace advice given to you by your health care provider. Make sure you discuss any questions you have with your health care provider. Document Revised: 01/24/2017 Document Reviewed: 07/17/2015 Elsevier Patient Education  2020 [employer]  

## 2019-11-16 NOTE — Progress Notes (Signed)
11/17/2019  CC:  Chief Complaint  Patient presents with  . Cysto    HPI: [name] is a 39 y.o. female who presents today for a cystoscopy.   On 06/07/19 she reported of passing a small stone or fragment 4 days prior to visit. KUB from visit with bilateral nephrolithiasis, consistent with prior scans and without evidence of ureteral calculi. In-office UA positive for3+ blood; urine microscopy with3-10RBCs/HPF.   Stone analysis shows 5% calcium oxalate dihydrate, 45% calcium oxalate monohydrate, and 50% calcium phosphate.  She has had ESWL x 4 andURS x 1.  She wason potassium citrate in the remote past but this upset her stomach. She is had a 24-hour urine metabolic work-up approximately 5 years ago.  KUB on 08/11/19 revealed BL punctate stones measuring up to 3 mm. No evidence of ureteral calculi. Awaiting final read from radiologist.   CT renal stone study on 11/10/19 noted bilateral nonobstructive nephrolithiasis. No hydronephrosis, hydroureter, ureteral calculus, or bladder calculus identified. Mild low-density prominence of the left ovary, cannot exclude a left ovarian cyst, but aside from slight difference in orientation this is not significantly changed from 06/08/2019.  She reports that she remains on Flomax. She has chronic constipation on MiraLax.   She quit smoking 5 years ago.   24 hour metabolic urine has improved over that last 5 years. Reasonable urine output of 2.12 liters. Urine citrate rose from 141 to 332 mg/d. Urine calcium was 174 mg/day. Urine magnesium remained low at 41 mg/d.     Blood pressure 125/85, pulse 89. NED. A&Ox3.   No respiratory distress   Abd soft, NT, ND Normal external genitalia with patent urethral meatus  Cystoscopy Procedure Note  Patient identification was confirmed, informed consent was obtained, and patient was prepped using Betadine solution.  Lidocaine jelly was administered per urethral meatus.    Procedure: -  Flexible cystoscope introduced, without any difficulty.   - Thorough search of the bladder revealed:    normal urethral meatus    normal urothelium    no stones    no ulcers     no tumors    no urethral polyps    no trabeculation  - Ureteral orifices were normal in position and appearance.  Post-Procedure: - Patient tolerated the procedure well  Pertinent image: CLINICAL DATA:  Right flank pain. History of renal calculi and lithotripsy. Hematuria last week.  EXAM: CT ABDOMEN AND PELVIS WITHOUT CONTRAST  TECHNIQUE: Multidetector CT imaging of the abdomen and pelvis was performed following the standard protocol without IV contrast.  COMPARISON:  06/08/2019  FINDINGS: Lower chest: Mild scarring or subsegmental atelectasis anteriorly in the right lower lobe and in the right middle lobe.  Hepatobiliary: Cholecystectomy.  Otherwise unremarkable.  Pancreas: Unremarkable  Spleen: Unremarkable  Adrenals/Urinary Tract: Both adrenal glands appear normal. There are 3 nonobstructive right renal calculi measuring up to 0.4 cm in long axis.  There are 5 nonobstructive left renal calculi measuring up to 0.4 cm in long axis.  No hydronephrosis, hydroureter, ureteral calculus, or bladder calculus identified. The urinary bladder is empty during imaging.  Stomach/Bowel: Prior appendectomy.  Otherwise unremarkable.  Vascular/Lymphatic: Unremarkable  Reproductive: Uterus absent. Prior right ovarian cystectomy. Mild low-density prominence of the left ovary, cannot exclude a left ovarian cyst, but aside from slight difference in orientation this is not significantly changed from 06/08/2019.  Other: No supplemental non-categorized findings.  Musculoskeletal: Unremarkable  IMPRESSION: 1. Bilateral nonobstructive nephrolithiasis. No hydronephrosis, hydroureter, ureteral calculus, or bladder calculus identified.  2. Mild low-density prominence of the left ovary,  cannot exclude a left ovarian cyst, but aside from slight difference in orientation this is not significantly changed from 06/08/2019.   Electronically Signed   By: Gaylyn Rong M.D.   On: 11/10/2019 16:56  I have personally reviewed the images and agree with radiologist interpretation.    Assessment/ Plan:  1. Microscopic hematuria  Current smoker. Cystoscopy is unremarkable.  Suspect related to stones    2. History of bilateral nephrolithiasis  CT scan from 11/10/19 showed multiple non-obstructing calculi, none greater than 5 mm.  Do not recommend any surgical intervention at this time for non-obstructing stone.    [facility] results reviewed, improved from 5 years ago.  Continue to encourage fluids.  Unable to tolerate any sort of potassium citrate or potassium bicarb products.  Rule of thiazide uncertain given reasonably normal urinary calcium levels.  Continue to encourage fluids.  Discussed avoidance of laxatives if all possible which may be contributing to low urinary magnesium.    Follow up in 6 months with Michiel Cowboy, PA-C for KUB.  Georges Mouse, am acting as a scribe for Dr. Vanna Scotland.  I have reviewed the above documentation for accuracy and completeness, and I agree with the above.   Vanna Scotland, MD

## 2019-11-17 ENCOUNTER — Other Ambulatory Visit: Payer: Self-pay

## 2019-11-17 ENCOUNTER — Ambulatory Visit (INDEPENDENT_AMBULATORY_CARE_PROVIDER_SITE_OTHER): Payer: BC Managed Care – PPO | Admitting: Urology

## 2019-11-17 VITALS — BP 125/85 | HR 89

## 2019-11-17 DIAGNOSIS — N200 Calculus of kidney: Secondary | ICD-10-CM | POA: Diagnosis not present

## 2019-11-18 LAB — URINALYSIS, COMPLETE
Bilirubin, UA: NEGATIVE
Glucose, UA: NEGATIVE
Ketones, UA: NEGATIVE
Leukocytes,UA: NEGATIVE
Nitrite, UA: NEGATIVE
Protein,UA: NEGATIVE
Specific Gravity, UA: 1.02 (ref 1.005–1.030)
Urobilinogen, Ur: 0.2 mg/dL (ref 0.2–1.0)
pH, UA: 6 (ref 5.0–7.5)

## 2019-11-18 LAB — MICROSCOPIC EXAMINATION

## 2019-11-21 IMAGING — CT CT ABD-PELV W/ CM
2 of 4 series · 16 of 46 positions shown, 18 images · IV contrast (omnipaque)
Comparison: 07/25/2017 stone study.

CLINICAL DATA: Left lower quadrant pain with nausea and vomiting
for 1 week. Hysterectomy. Cholecystectomy. Appendectomy.
Diverticulitis suspected.

EXAM:
CT ABDOMEN AND PELVIS WITH CONTRAST
TECHNIQUE: Multidetector CT imaging of the abdomen and pelvis was performed
using the standard protocol following bolus administration of
intravenous contrast.
CONTRAST:  100mL OMNIPAQUE IOHEXOL 300 MG/ML  SOLN

[Series 2: axial st · axial · 0.73mm/px · z∈[-538,-78]mm · 13 of 102 slices shown, 15 images]
[im 5/102  soft-tissue]
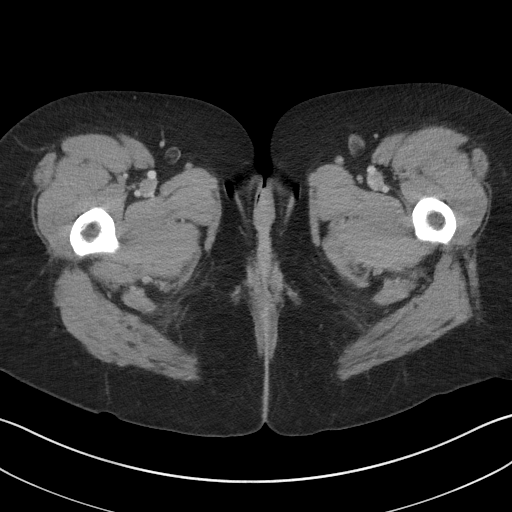
[im 5/102  bone]
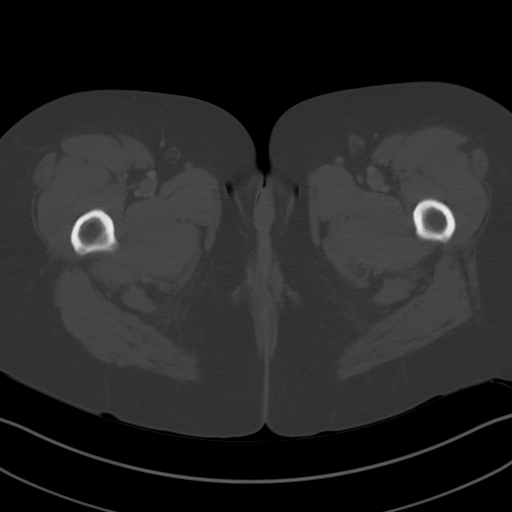
[im 13/102  soft-tissue]
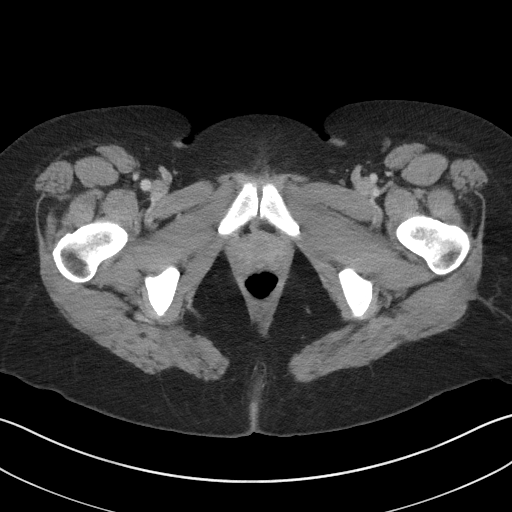
[im 22/102  soft-tissue]
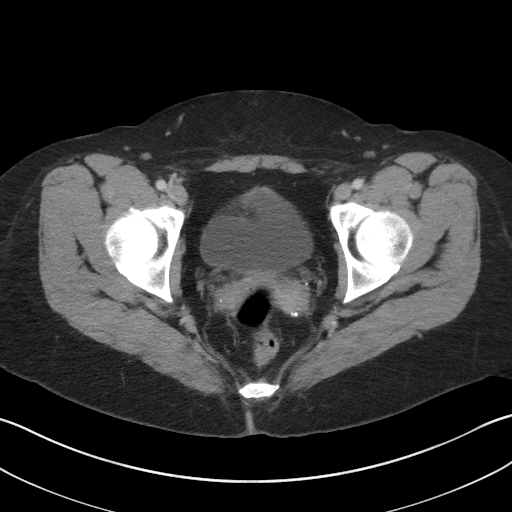
[im 30/102  soft-tissue]
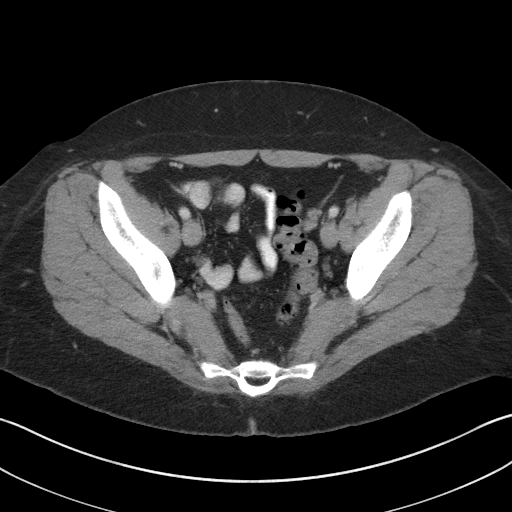
[im 34/102  soft-tissue]
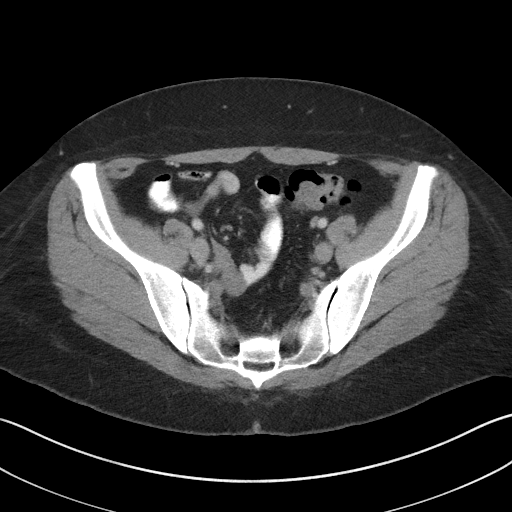
[im 43/102  soft-tissue]
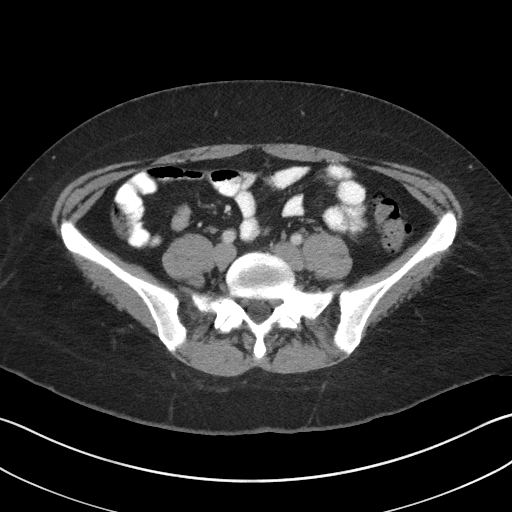
[im 51/102  soft-tissue]
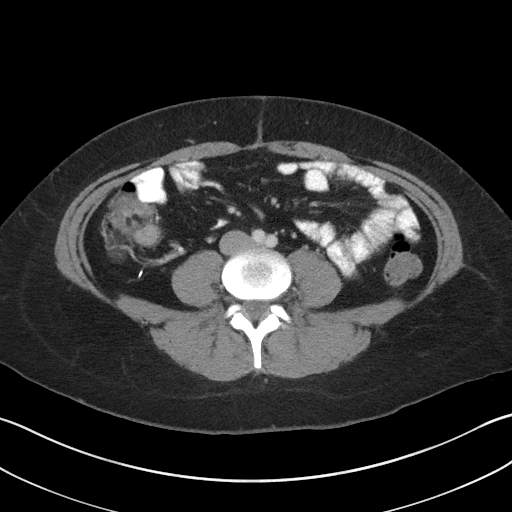
[im 59/102  soft-tissue]
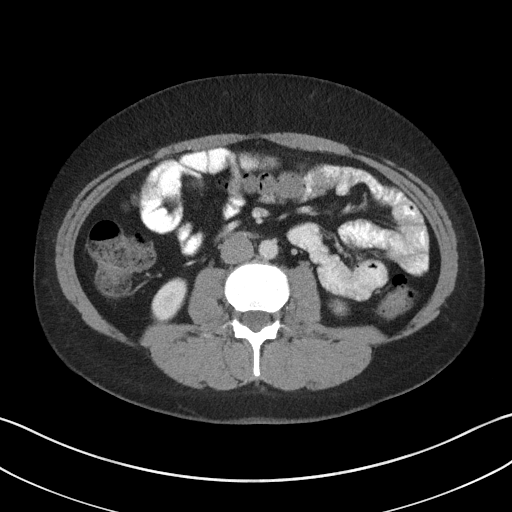
[im 68/102  soft-tissue]
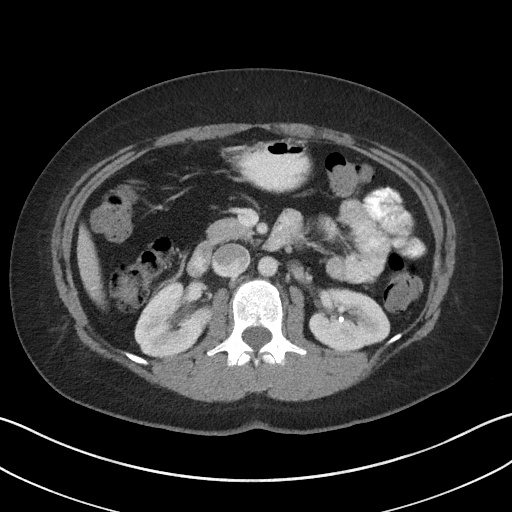
[im 68/102  bone]
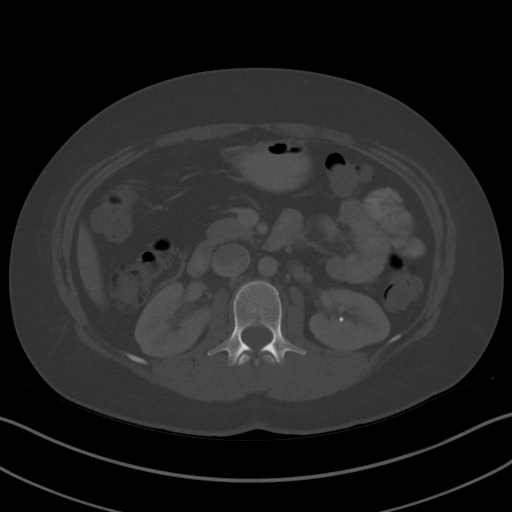
[im 72/102  soft-tissue]
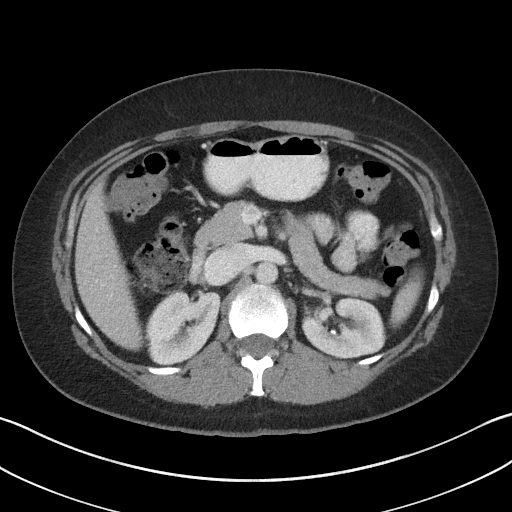
[im 80/102  soft-tissue]
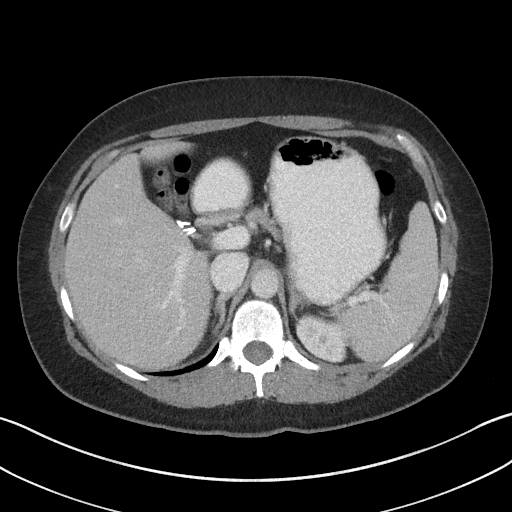
[im 89/102  soft-tissue]
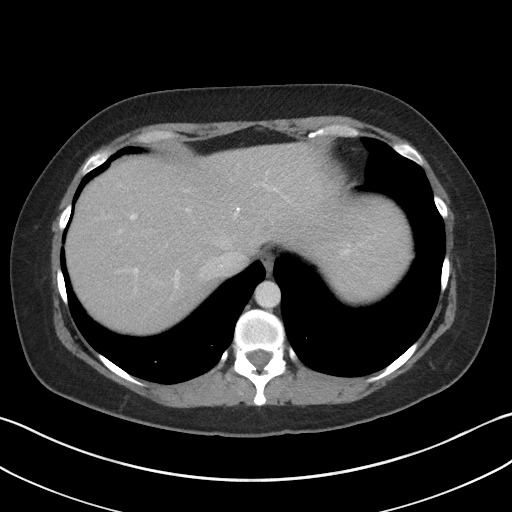
[im 97/102  soft-tissue]
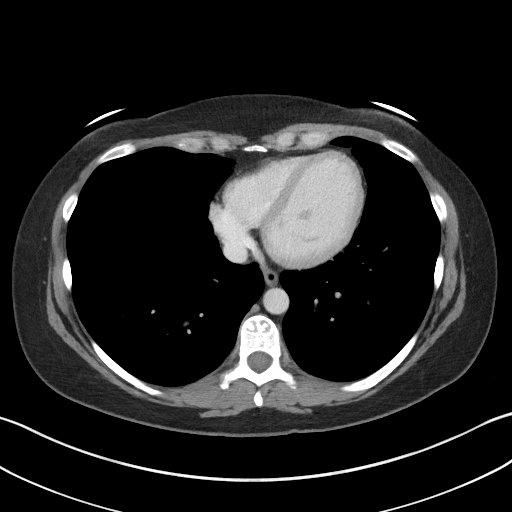

[Series 5: coronal st · coronal · 0.84mm/px · 3 of 79 slices shown]
[im 27/79  soft-tissue]
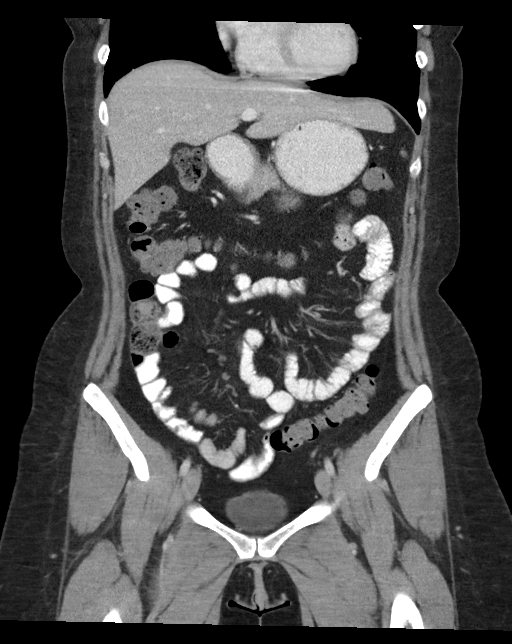
[im 35/79  soft-tissue]
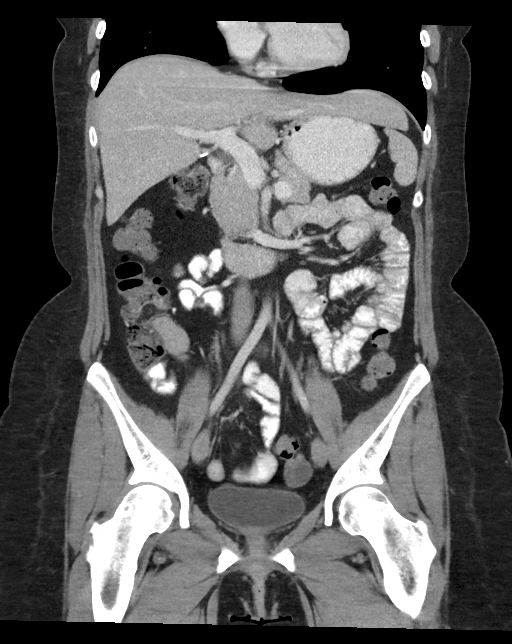
[im 44/79  soft-tissue]
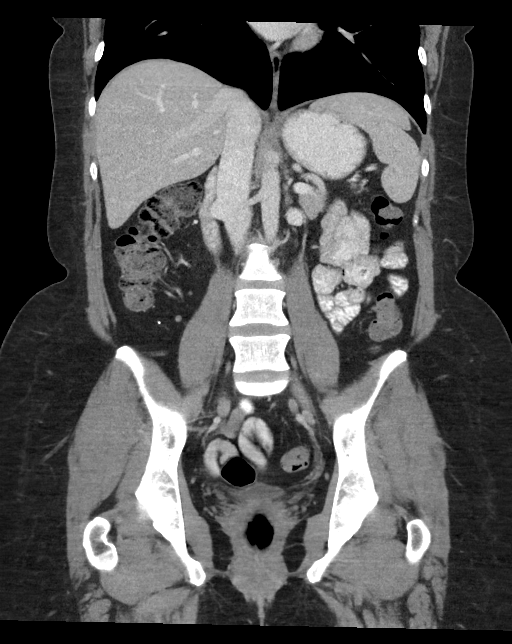

[16 of 46 positions shown; findings below may reference images not displayed]

FINDINGS: Lower chest: Clear lung bases. Normal heart size without pericardial
or pleural effusion.

Hepatobiliary: Variant lateral segment left liver lobe extending in
the left upper quadrant. Cholecystectomy, without biliary ductal
dilatation.

Pancreas: Normal, without mass or ductal dilatation.

Spleen: 6 mm low-density splenic lesion is of no clinical
significance.

Adrenals/Urinary Tract: Normal adrenal glands. Bilateral renal
collecting system calculi again identified. These measure maximally
4 mm. No hydronephrosis. Normal ureters and urinary bladder.

Stomach/Bowel: Normal stomach, without wall thickening. Colonic
stool burden suggests constipation. Normal terminal ileum. Normal
small bowel.

Vascular/Lymphatic: Normal caliber of the aorta and branch vessels.
No abdominopelvic adenopathy.

Reproductive: Hysterectomy.  No adnexal mass.

Other: No significant free fluid.  No free intraperitoneal air.

Musculoskeletal: No acute osseous abnormality.
IMPRESSION: 1. Bilateral nephrolithiasis, without obstructive uropathy.
2.  Possible constipation.  No other explanation for pain.

These results will be called to the ordering clinician or
representative by the [HOSPITAL] at the imaging location.

## 2019-12-09 ENCOUNTER — Emergency Department: Payer: BC Managed Care – PPO

## 2019-12-09 ENCOUNTER — Encounter: Payer: Self-pay | Admitting: Emergency Medicine

## 2019-12-09 ENCOUNTER — Emergency Department
Admission: EM | Admit: 2019-12-09 | Discharge: 2019-12-09 | Disposition: A | Discharge status: Home / Self Care | Payer: BC Managed Care – PPO | Attending: Emergency Medicine | Admitting: Emergency Medicine

## 2019-12-09 ENCOUNTER — Other Ambulatory Visit: Payer: Self-pay

## 2019-12-09 DIAGNOSIS — Z87891 Personal history of nicotine dependence: Secondary | ICD-10-CM | POA: Insufficient documentation

## 2019-12-09 DIAGNOSIS — M542 Cervicalgia: Secondary | ICD-10-CM | POA: Diagnosis not present

## 2019-12-09 DIAGNOSIS — R079 Chest pain, unspecified: Secondary | ICD-10-CM | POA: Diagnosis not present

## 2019-12-09 DIAGNOSIS — M25531 Pain in right wrist: Secondary | ICD-10-CM | POA: Diagnosis not present

## 2019-12-09 DIAGNOSIS — J45909 Unspecified asthma, uncomplicated: Secondary | ICD-10-CM | POA: Diagnosis not present

## 2019-12-09 DIAGNOSIS — Y92410 Unspecified street and highway as the place of occurrence of the external cause: Secondary | ICD-10-CM | POA: Diagnosis not present

## 2019-12-09 DIAGNOSIS — M7918 Myalgia, other site: Secondary | ICD-10-CM

## 2019-12-09 DIAGNOSIS — Y9389 Activity, other specified: Secondary | ICD-10-CM | POA: Diagnosis not present

## 2019-12-09 DIAGNOSIS — M25512 Pain in left shoulder: Secondary | ICD-10-CM | POA: Insufficient documentation

## 2019-12-09 DIAGNOSIS — J90 Pleural effusion, not elsewhere classified: Secondary | ICD-10-CM | POA: Diagnosis not present

## 2019-12-09 DIAGNOSIS — J9811 Atelectasis: Secondary | ICD-10-CM | POA: Diagnosis not present

## 2019-12-09 MED ORDER — ORPHENADRINE CITRATE 30 MG/ML IJ SOLN
60.0000 mg | Freq: Two times a day (BID) | INTRAMUSCULAR | Status: DC
  Administered 2019-12-09: 60 mg via INTRAMUSCULAR
  Filled 2019-12-09: qty 2

## 2019-12-09 MED ORDER — KETOROLAC TROMETHAMINE 30 MG/ML IJ SOLN
30.0000 mg | Freq: Once | INTRAMUSCULAR | Status: AC
  Administered 2019-12-09: 30 mg via INTRAMUSCULAR
  Filled 2019-12-09: qty 1

## 2019-12-09 MED ORDER — HYDROMORPHONE HCL 1 MG/ML IJ SOLN
1.0000 mg | Freq: Once | INTRAMUSCULAR | Status: AC
  Administered 2019-12-09: 1 mg via INTRAMUSCULAR
  Filled 2019-12-09: qty 1

## 2019-12-09 MED ORDER — KETOROLAC TROMETHAMINE 10 MG PO TABS: 10.0000 mg | ORAL_TABLET | Freq: Four times a day (QID) | ORAL | 0 refills | Status: DC | PRN

## 2019-12-09 MED ORDER — ORPHENADRINE CITRATE ER 100 MG PO TB12: 100.0000 mg | ORAL_TABLET | Freq: Two times a day (BID) | ORAL | 0 refills | Status: DC

## 2019-12-09 NOTE — ED Notes (Signed)
Patient ambulated to exam room with a steady gait, dyspnea with exertion was noted.
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
## 2019-12-09 NOTE — Discharge Instructions (Addendum)
Follow discharge care instructions.  Continue previous pain medication.

## 2019-12-13 ENCOUNTER — Ambulatory Visit: Payer: BC Managed Care – PPO | Admitting: Physician Assistant

## 2019-12-13 ENCOUNTER — Ambulatory Visit
Admission: RE | Admit: 2019-12-13 | Discharge: 2019-12-13 | Disposition: A | Discharge status: Home / Self Care | Payer: BC Managed Care – PPO | Source: Ambulatory Visit | Attending: Physician Assistant | Admitting: Physician Assistant

## 2019-12-13 ENCOUNTER — Encounter: Payer: Self-pay | Admitting: Physician Assistant

## 2019-12-13 ENCOUNTER — Ambulatory Visit
Admission: RE | Admit: 2019-12-13 | Discharge: 2019-12-13 | Disposition: A | Discharge status: Home / Self Care | Payer: BC Managed Care – PPO | Attending: Physician Assistant | Admitting: Physician Assistant

## 2019-12-13 ENCOUNTER — Other Ambulatory Visit: Payer: Self-pay

## 2019-12-13 VITALS — BP 119/82 | HR 75 | Temp 98.4°F | Resp 16 | Wt 236.2 lb

## 2019-12-13 DIAGNOSIS — Z041 Encounter for examination and observation following transport accident: Secondary | ICD-10-CM | POA: Diagnosis not present

## 2019-12-13 DIAGNOSIS — M25531 Pain in right wrist: Secondary | ICD-10-CM

## 2019-12-13 DIAGNOSIS — J9811 Atelectasis: Secondary | ICD-10-CM

## 2019-12-13 DIAGNOSIS — R1011 Right upper quadrant pain: Secondary | ICD-10-CM
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
## 2019-12-13 NOTE — Patient Instructions (Signed)
Cervical Sprain  A cervical sprain is a stretch or tear in one or more of the tough, cord-like tissues that connect bones (ligaments) in the neck. Cervical sprains can range from mild to severe. Severe cervical sprains can cause the spinal bones (vertebrae) in the neck to be unstable. This can lead to spinal cord damage and can result in serious nervous system problems. The amount of time that it takes for a cervical sprain to get better depends on the cause and extent of the injury. Most cervical sprains heal in 4-6 weeks. What are the causes? Cervical sprains may be caused by an injury (trauma), such as from a motor vehicle accident, a fall, or sudden forward and backward whipping movement of the head and neck (whiplash injury). Mild cervical sprains may be caused by wear and tear over time, such as from poor posture, sitting in a chair that does not provide support, or looking up or down for long periods of time. What increases the risk? The following factors may make you more likely to develop this condition:  Participating in activities that have a high risk of trauma to the neck. These include contact sports, auto racing, gymnastics, and diving.  Taking risks when driving or riding in a motor vehicle, such as speeding.  Having osteoarthritis of the spine.  Having poor strength and flexibility of the neck.  A previous neck injury.  Having poor posture.  Spending a lot of time in certain positions that put stress on the neck, such as sitting at a computer for long periods of time. What are the signs or symptoms? Symptoms of this condition include:  Pain, soreness, stiffness, tenderness, swelling, or a burning sensation in the front, back, or sides of the neck.  Sudden tightening of neck muscles that you cannot control (muscle spasms).  Pain in the shoulders or upper back.  Limited ability to move the neck.  Headache.  Dizziness.  Nausea.  Vomiting.  Weakness, numbness,  or tingling in a hand or an arm. Symptoms may develop right away after injury, or they may develop over a few days. In some cases, symptoms may go away with treatment and return (recur) over time. How is this diagnosed? This condition may be diagnosed based on:  Your medical history.  Your symptoms.  Any recent injuries or known neck problems that you have, such as arthritis in the neck.  A physical exam.  Imaging tests, such as: ? X-rays. ? MRI. ? CT scan. How is this treated? This condition is treated by resting and icing the injured area and doing physical therapy exercises. Depending on the severity of your condition, treatment may also include:  Keeping your neck in place (immobilized) for periods of time. This may be done using: ? A cervical collar. This supports your chin and the back of your head. ? A cervical traction device. This is a sling that holds up your head. This removes weight and pressure from your neck, and it may help to relieve pain.  Medicines that help to relieve pain and inflammation.  Medicines that help to relax your muscles (muscle relaxants).  Surgery. This is rare. Follow these instructions at home: If you have a cervical collar:   Wear it as told by your health care provider. Do not remove the collar unless instructed by your health care provider.  Ask your health care provider before you make any adjustments to your collar.  If you have long hair, keep it outside of the   collar.  Ask your health care provider if you can remove the collar for cleaning and bathing. If you are allowed to remove the collar for cleaning or bathing: ? Follow instructions from your health care provider about how to remove the collar safely. ? Clean the collar by wiping it with mild soap and water and drying it completely. ? If your collar has removable pads, remove them every 1-2 days and wash them by hand with soap and water. Let them air-dry completely before you  put them back in the collar. ? Check your skin under the collar for irritation or sores. If you see any, tell your health care provider. Managing pain, stiffness, and swelling   If directed, use a cervical traction device as told by your health care provider.  If directed, apply heat to the affected area before you do your physical therapy or as often as told by your health care provider. Use the heat source that your health care provider recommends, such as a moist heat pack or a heating pad. ? Place a towel between your skin and the heat source. ? Leave the heat on for 20-30 minutes. ? Remove the heat if your skin turns bright red. This is especially important if you are unable to feel pain, heat, or cold. You may have a greater risk of getting burned.  If directed, put ice on the affected area: ? Put ice in a plastic bag. ? Place a towel between your skin and the bag. ? Leave the ice on for 20 minutes, 2-3 times a day. Activity  Do not drive while wearing a cervical collar. If you do not have a cervical collar, ask your health care provider if it is safe to drive while your neck heals.  Do not drive or use heavy machinery while taking prescription pain medicine or muscle relaxants, unless your health care provider approves.  Do not lift anything that is heavier than 10 lb (4.5 kg) until your health care provider tells you that it is safe.  Rest as directed by your health care provider. Avoid positions and activities that make your symptoms worse. Ask your health care provider what activities are safe for you.  If physical therapy was prescribed, do exercises as told by your health care provider or physical therapist. General instructions  Take over-the-counter and prescription medicines only as told by your health care provider.  Do not use any products that contain nicotine or tobacco, such as cigarettes and e-cigarettes. These can delay healing. If you need help quitting, ask your  health care provider.  Keep all follow-up visits as told by your health care provider or physical therapist. This is important. How is this prevented? To prevent a cervical sprain from happening again:  Use and maintain good posture. Make any needed adjustments to your workstation to help you use good posture.  Exercise regularly as directed by your health care provider or physical therapist.  Avoid risky activities that may cause a cervical sprain. Contact a health care provider if:  You have symptoms that get worse or do not get better after 2 weeks of treatment.  You have pain that gets worse or does not get better with medicine.  You develop new, unexplained symptoms.  You have sores or irritated skin on your neck from wearing your cervical collar. Get help right away if:  You have severe pain.  You develop numbness, tingling, or weakness in any part of your body.  You cannot move   a part of your body (you have paralysis).  You have neck pain along with: ? Severe dizziness. ? Headache. Summary  A cervical sprain is a stretch or tear in one or more of the tough, cord-like tissues that connect bones (ligaments) in the neck.  Cervical sprains may be caused by an injury (trauma), such as from a motor vehicle accident, a fall, or sudden forward and backward whipping movement of the head and neck (whiplash injury).  Symptoms may develop right away after injury, or they may develop over a few days.  This condition is treated by resting and icing the injured area and doing physical therapy exercises. This information is not intended to replace advice given to you by your health care provider. Make sure you discuss any questions you have with your health care provider. Document Revised: 06/03/2018 Document Reviewed: 10/11/2015 Elsevier Patient Education  2020 [employer]   Cervical Strain and Sprain Rehab Ask your health care provider which exercises are safe for you. Do  exercises exactly as told by your health care provider and adjust them as directed. It is normal to feel mild stretching, pulling, tightness, or discomfort as you do these exercises. Stop right away if you feel sudden pain or your pain gets worse. Do not begin these exercises until told by your health care provider. Stretching and range-of-motion exercises Cervical side bending  1. Using good posture, sit on a stable chair or stand up. 2. Without moving your shoulders, slowly tilt your left / right ear to your shoulder until you feel a stretch in the opposite side neck muscles. You should be looking straight ahead. 3. Hold for __________ seconds. 4. Repeat with the other side of your neck. Repeat __________ times. Complete this exercise __________ times a day. Cervical rotation  1. Using good posture, sit on a stable chair or stand up. 2. Slowly turn your head to the side as if you are looking over your left / right shoulder. ? Keep your eyes level with the ground. ? Stop when you feel a stretch along the side and the back of your neck. 3. Hold for __________ seconds. 4. Repeat this by turning to your other side. Repeat __________ times. Complete this exercise __________ times a day. Thoracic extension and pectoral stretch 1. Roll a towel or a small blanket so it is about 4 inches (10 cm) in diameter. 2. Lie down on your back on a firm surface. 3. Put the towel lengthwise, under your spine in the middle of your back. It should not be under your shoulder blades. The towel should line up with your spine from your middle back to your lower back. 4. Put your hands behind your head and let your elbows fall out to your sides. 5. Hold for __________ seconds. Repeat __________ times. Complete this exercise __________ times a day. Strengthening exercises Isometric upper cervical flexion 1. Lie on your back with a thin pillow behind your head and a small rolled-up towel under your neck. 2. Gently  tuck your chin toward your chest and nod your head down to look toward your feet. Do not lift your head off the pillow. 3. Hold for __________ seconds. 4. Release the tension slowly. Relax your neck muscles completely before you repeat this exercise. Repeat __________ times. Complete this exercise __________ times a day. Isometric cervical extension  1. Stand about 6 inches (15 cm) away from a wall, with your back facing the wall. 2. Place a soft object, about 6-8  inches (15-20 cm) in diameter, between the back of your head and the wall. A soft object could be a small pillow, a ball, or a folded towel. 3. Gently tilt your head back and press into the soft object. Keep your jaw and forehead relaxed. 4. Hold for __________ seconds. 5. Release the tension slowly. Relax your neck muscles completely before you repeat this exercise. Repeat __________ times. Complete this exercise __________ times a day. Posture and body mechanics Body mechanics refers to the movements and positions of your body while you do your daily activities. Posture is part of body mechanics. Good posture and healthy body mechanics can help to relieve stress in your body's tissues and joints. Good posture means that your spine is in its natural S-curve position (your spine is neutral), your shoulders are pulled back slightly, and your head is not tipped forward. The following are general guidelines for applying improved posture and body mechanics to your everyday activities. Sitting  1. When sitting, keep your spine neutral and keep your feet flat on the floor. Use a footrest, if necessary, and keep your thighs parallel to the floor. Avoid rounding your shoulders, and avoid tilting your head forward. 2. When working at a desk or a computer, keep your desk at a height where your hands are slightly lower than your elbows. Slide your chair under your desk so you are close enough to maintain good posture. 3. When working at a computer,  place your monitor at a height where you are looking straight ahead and you do not have to tilt your head forward or downward to look at the screen. Standing   When standing, keep your spine neutral and keep your feet about hip-width apart. Keep a slight bend in your knees. Your ears, shoulders, and hips should line up.  When you do a task in which you stand in one place for a long time, place one foot up on a stable object that is 2-4 inches (5-10 cm) high, such as a footstool. This helps keep your spine neutral. Resting When lying down and resting, avoid positions that are most painful for you. Try to support your neck in a neutral position. You can use a contour pillow or a small rolled-up towel. Your pillow should support your neck but not push on it. This information is not intended to replace advice given to you by your health care provider. Make sure you discuss any questions you have with your health care provider. Document Revised: 06/03/2018 Document Reviewed: 11/12/2017 Elsevier Patient Education  2020 Elsevier Inc.  

## 2019-12-14 ENCOUNTER — Telehealth: Payer: Self-pay

## 2019-12-14 NOTE — Telephone Encounter (Signed)
-----   Message from [name], PA-C sent at 12/14/2019  9:56 AM EDT ----- The left atelectasis has cleared. The right has improved but still some remaining, which is probably consistent with what had been seen in the chest CT.

## 2019-12-14 NOTE — Telephone Encounter (Signed)
-----   Message from [name], [place] sent at 12/14/2019  9:57 AM EDT ----- There is no fracture noted in the right wrist. You could wear a wrist brace for a week or two to limit movement and help healing process.

## 2019-12-14 NOTE — Telephone Encounter (Signed)
Written by [name], PA-C on 12/14/2019 9:56 AM EDT Seen by patient [name] on 12/14/2019 12:45 PM

## 2019-12-14 NOTE — Telephone Encounter (Signed)
Written by [name], PA-C on 12/14/2019 9:57 AM EDT Seen by patient [name] on 12/14/2019 12:45 PM

## 2019-12-21 ENCOUNTER — Encounter: Payer: Self-pay | Admitting: *Deleted

## 2019-12-27 DIAGNOSIS — M7061 Trochanteric bursitis, right hip: Secondary | ICD-10-CM | POA: Diagnosis not present

## 2019-12-27 DIAGNOSIS — M7062 Trochanteric bursitis, left hip: Secondary | ICD-10-CM | POA: Diagnosis not present

## 2020-02-10 ENCOUNTER — Other Ambulatory Visit: Payer: Self-pay | Admitting: Physician Assistant

## 2020-02-10 DIAGNOSIS — F411 Generalized anxiety disorder: Secondary | ICD-10-CM

## 2020-02-10 DIAGNOSIS — F5101 Primary insomnia: Secondary | ICD-10-CM

## 2020-02-10 NOTE — Telephone Encounter (Signed)
Requested medication (s) are due for refill today:   Provider to determine  Requested medication (s) are on the active medication list:   Yes  Future visit scheduled:   No   Last ordered: 06/30/2019 #60, 5 refills  Non delegated refill   Requested Prescriptions  Pending Prescriptions Disp Refills   ALPRAZolam (XANAX) 1 MG tablet [Pharmacy Med Name: ALPRAZOLAM 1 MG TAB] 60 tablet     Sig: TAKE 1/2 TO 1 TABLET BY MOUTH TWICE DAILY AS NEEDED FOR ANXIETY      Not Delegated - Psychiatry:  Anxiolytics/Hypnotics Failed - 02/10/2020  1:25 PM      Failed - This refill cannot be delegated      Failed - Urine Drug Screen completed in last 360 days      Passed - Valid encounter within last 6 months    Recent Outpatient Visits           1 month ago Atelectasis   [facility] Joycelyn Man M, PA-C   2 months ago Class 2 severe obesity due to excess calories with serious comorbidity and body mass index (BMI) of 36.0 to 36.9 in adult [facility])   [facility] [place], [place], [place]   10 months ago Chest wall pain   [facility] [place], [place], [place]   1 year ago Annual physical exam   [facility] [place], [place]andra [place], [place]   1 year ago Dysuria   [facility] [place], [place], [place]       Future Appointments             In 3 months [place], [facility]ana [facility]

## 2020-02-28 DIAGNOSIS — Z79899 Other long term (current) drug therapy: Secondary | ICD-10-CM | POA: Diagnosis not present

## 2020-02-28 DIAGNOSIS — M47817 Spondylosis without myelopathy or radiculopathy, lumbosacral region: Secondary | ICD-10-CM | POA: Diagnosis not present

## 2020-02-28 DIAGNOSIS — F418 Other specified anxiety disorders: Secondary | ICD-10-CM | POA: Diagnosis not present

## 2020-02-28 DIAGNOSIS — M5416 Radiculopathy, lumbar region: Secondary | ICD-10-CM | POA: Diagnosis not present

## 2020-04-20 ENCOUNTER — Other Ambulatory Visit: Payer: Self-pay | Admitting: Physician Assistant

## 2020-04-20 DIAGNOSIS — E559 Vitamin D deficiency, unspecified: Secondary | ICD-10-CM

## 2020-04-20 NOTE — Telephone Encounter (Signed)
Requested medication (s) are due for refill today: no  Requested medication (s) are on the active medication list:  yes  Last refill: 07/30/2019  Future visit scheduled: no  Notes to clinic:  this refill cannot be delegated    Requested Prescriptions  Pending Prescriptions Disp Refills   Vitamin D, Ergocalciferol, (DRISDOL) 1.25 MG (50000 UNIT) CAPS capsule [Pharmacy Med Name: VITAMIN D (ERGOCALCIFEROL) 1.25 MG] 12 capsule 3    Sig: TAKE 1 CAPSULE (50,000 UNITS) BY MOUTH EVERY 7 DAYS      Endocrinology:  Vitamins - Vitamin D Supplementation Failed - 04/20/2020 10:09 AM      Failed - 50,000 IU strengths are not delegated      Failed - Phosphate in normal range and within 360 days    No results found for: PHOS        Failed - Vitamin D in normal range and within 360 days    Vit D, 25-Hydroxy  Date Value Ref Range Status  11/25/2018 31.5 30.0 - 100.0 ng/mL Final    Comment:    Vitamin D deficiency has been defined by the Institute of Medicine and an Endocrine Society practice guideline as a level of serum 25-OH vitamin D less than 20 ng/mL (1,2). The Endocrine Society went on to further define vitamin D insufficiency as a level between 21 and 29 ng/mL (2). 1. IOM (Institute of Medicine). 2010. Dietary reference    intakes for calcium and D. [place]: The    [employer]. 2. Holick MF, Binkley [place], Bischoff-Ferrari HA, et al.    Evaluation, treatment, and prevention of vitamin D    deficiency: an Endocrine Society clinical practice    guideline. JCEM. 2011 Jul; 96(7):1911-30.           Passed - Ca in normal range and within 360 days    Calcium  Date Value Ref Range Status  06/08/2019 9.2 8.9 - 10.3 mg/dL Final   Calcium, Total  Date Value Ref Range Status  06/14/2014 9.6 mg/dL Final    Comment:    9.4-76.5 NOTE: New Reference Range  05/03/14    Calcium, Ion  Date Value Ref Range Status  04/21/2014 1.20 1.12 - 1.23 mmol/L Final          Passed  - Valid encounter within last 12 months    Recent Outpatient Visits           4 months ago Atelectasis   [facility] [place], Victorino Dike M, PA-C   5 months ago Class 2 severe obesity due to excess calories with serious comorbidity and body mass index (BMI) of 36.0 to 36.9 in adult [facility])   [facility] [place], Alessandra Bevels, [place]   1 year ago Chest wall pain   [facility] [place], Alessandra Bevels, [place]   1 year ago Annual physical exam   [facility] [place], Alessandra Bevels, [place]   2 years ago Dysuria   [facility] [place], Lavella Hammock, [place]       Future Appointments             In 3 weeks [place], Elana Alm [facility]

## 2020-05-01 DIAGNOSIS — M546 Pain in thoracic spine: Secondary | ICD-10-CM | POA: Diagnosis not present

## 2020-05-01 DIAGNOSIS — M47817 Spondylosis without myelopathy or radiculopathy, lumbosacral region: Secondary | ICD-10-CM | POA: Diagnosis not present

## 2020-05-01 DIAGNOSIS — M419 Scoliosis, unspecified: Secondary | ICD-10-CM | POA: Insufficient documentation

## 2020-05-01 DIAGNOSIS — F418 Other specified anxiety disorders: Secondary | ICD-10-CM | POA: Diagnosis not present

## 2020-05-01 DIAGNOSIS — M5416 Radiculopathy, lumbar region: Secondary | ICD-10-CM | POA: Diagnosis not present

## 2020-05-01 DIAGNOSIS — Z79899 Other long term (current) drug therapy: Secondary | ICD-10-CM | POA: Diagnosis not present

## 2020-05-01 DIAGNOSIS — M25572 Pain in left ankle and joints of left foot: Secondary | ICD-10-CM | POA: Diagnosis not present

## 2020-05-08 ENCOUNTER — Other Ambulatory Visit: Payer: Self-pay

## 2020-05-08 ENCOUNTER — Ambulatory Visit: Payer: BC Managed Care – PPO | Admitting: Physician Assistant

## 2020-05-08 ENCOUNTER — Encounter: Payer: Self-pay | Admitting: Physician Assistant

## 2020-05-08 VITALS — BP 110/88 | HR 94 | Temp 98.6°F | Resp 16 | Ht 67.0 in | Wt 238.1 lb

## 2020-05-08 DIAGNOSIS — G43009 Migraine without aura, not intractable, without status migrainosus: Secondary | ICD-10-CM | POA: Diagnosis not present

## 2020-05-08 DIAGNOSIS — Z8269 Family history of other diseases of the musculoskeletal system and connective tissue: Secondary | ICD-10-CM

## 2020-05-08 MED ORDER — RIZATRIPTAN BENZOATE 10 MG PO TABS: 10.0000 mg | ORAL_TABLET | ORAL | 5 refills | Status: DC | PRN

## 2020-05-08 NOTE — Progress Notes (Signed)
Established patient visit   Patient: [name]   DOB: 1980/12/09   40 y.o. Female  MRN: [id number] Visit Date: 05/08/2020  Today's healthcare provider: Mar Daring, PA-C   No chief complaint on file.  Subjective    HPI  Patient here today to talk about getting an echo done. Patient reports she wants this done due to long wait for gentic testing. 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 She has been referred to genetics and has talked with them. They are on a 2 yr wait and she will most likely not be seen until next Spring/Summer 2023. She is followed by multiple other specialist (ortho, pain management). Patient requesting also medications refilled for migraine head ache.   Patient Active Problem List   Diagnosis Date Noted  . Endometriosis 08/27/2017  . Pelvic pain 08/27/2017  . IBS (irritable bowel syndrome) 02/02/2015  . Herpes simplex 02/02/2015  . Asthma, mild intermittent 11/03/2014  . Migraine without aura or status migrainosus 11/03/2014  . Gastroesophageal reflux disease without esophagitis 11/03/2014  . Generalized anxiety disorder 11/03/2014  . Cyst of ovary 08/06/2013  . Appendicolith 07/16/2013  . Chronic constipation 06/30/2012  . History of pyelonephritis 06/29/2012  . Nephrolithiasis 06/28/2012   Past Surgical History:  Procedure Laterality Date  . ABDOMINAL HYSTERECTOMY  2006  . ANKLE ARTHROSCOPY Left 2017  . ANKLE ARTHROSCOPY Left 11/24/2015  . APPENDECTOMY  07/26/13  . CAST APPLICATION  25/42/7062   Procedure: CAST APPLICATION;  Surgeon: Johnn Hai, MD;  Location: [facility];  Service: Orthopedics;  Laterality: Left;  . CHOLECYSTECTOMY  03/11/2011   Procedure: LAPAROSCOPIC CHOLECYSTECTOMY WITH INTRAOPERATIVE CHOLANGIOGRAM;  Surgeon: Gayland Curry, MD;  Location: [place];  Service: General;  Laterality: N/A;  . CHOLECYSTECTOMY, LAPAROSCOPIC    . CYSTOSCOPY/URETEROSCOPY/HOLMIUM LASER/STENT PLACEMENT Left  06/18/2016   Procedure: CYSTOSCOPY/URETEROSCOPY/HOLMIUM LASER/STENT PLACEMENT;  Surgeon: Hollice Espy, MD;  Location: [facility];  Service: Urology;  Laterality: Left;  . LAPAROSCOPIC OVARIAN CYSTECTOMY Right 09/02/2017   Procedure: LAPAROSCOPIC OVARIAN CYSTECTOMY;  Surgeon: Gae Dry, MD;  Location: [facility];  Service: Gynecology;  Laterality: Right;  . LITHOTRIPSY     x 4  . ORIF ANKLE FRACTURE  12/25/2011   Procedure: OPEN REDUCTION INTERNAL FIXATION (ORIF) ANKLE FRACTURE;  Surgeon: Johnn Hai, MD;  [facility];  Service: Orthopedics;  Laterality: Left;  . TONSILLECTOMY     Social History   Socioeconomic History  . Marital status: Married    Spouse name: Mia Creek  . Number of children: Not on file  . Years of education: Not on file  . Highest education level: Not on file  Occupational History  . Not on file  Tobacco Use  . Smoking status: Former Smoker    Packs/day: 0.50    Years: 15.00    Pack years: 7.50    Types: Cigarettes    Start date: 11/03/1999    Quit date: 09/26/2014    Years since quitting: 5.6  . Smokeless tobacco: Never Used  Vaping Use  . Vaping Use: Never used  Substance and Sexual Activity  . Alcohol use: Yes    Alcohol/week: 0.0 standard drinks    Comment: social-rare  . Drug use: No  . Sexual activity: Yes    Birth control/protection: Surgical    Comment: Hysterectomy   Other Topics Concern  . Not on file  Social History Narrative  . Not on file   Social Determinants of Health  Financial Resource Strain: Not on file  Food Insecurity: Not on file  Transportation Needs: Not on file  Physical Activity: Not on file  Stress: Not on file  Social Connections: Not on file  Intimate Partner Violence: Not on file   Family History  Problem Relation Age of Onset  . Diabetes Mother   . Diabetes Father   . Heart failure Father   . Hypertension Father   . Pulmonary fibrosis Father   . Nephrolithiasis Father   . Kidney cancer Neg Hx   .  Bladder Cancer Neg Hx    Allergies  Allergen Reactions  . Compazine Other (See Comments)    Dystonia  . Metoclopramide Other (See Comments)    dystonia       Medications: Outpatient Medications Prior to Visit  Medication Sig  . Vitamin D, Ergocalciferol, (DRISDOL) 1.25 MG (50000 UNIT) CAPS capsule TAKE 1 CAPSULE (50,000 UNITS) BY MOUTH EVERY 7 DAYS  . ALPRAZolam (XANAX) 1 MG tablet TAKE 1/2 TO 1 TABLET BY MOUTH TWICE DAILY AS NEEDED FOR ANXIETY  . HYDROcodone-acetaminophen (NORCO) 10-325 MG tablet Take 0.5-1 tablets by mouth every 6 (six) hours as needed for severe pain. Max 2 tabs per day  . ketorolac (TORADOL) 10 MG tablet Take 1 tablet (10 mg total) by mouth every 6 (six) hours as needed.  . metroNIDAZOLE (METROGEL) 0.75 % vaginal gel Use one applicator intravaginally after sexual intercourse  . Multiple Vitamin (MULTIVITAMIN WITH MINERALS) TABS tablet Take 1 tablet by mouth daily.  . naloxone (NARCAN) 4 MG/0.1ML LIQD nasal spray kit Place 1 spray into the nose as needed (opioid overdose).  Marland Kitchen omeprazole (PRILOSEC) 20 MG capsule TAKE 1 CAPSULE BY MOUTH ONCE DAILY  . OnabotulinumtoxinA (BOTOX IJ) Inject 1 each as directed every 3 (three) months. Alternating with trigger point inj  . orphenadrine (NORFLEX) 100 MG tablet Take 1 tablet (100 mg total) by mouth 2 (two) times daily.  . polyethylene glycol (MIRALAX / GLYCOLAX) packet Take 17 g by mouth every other day.   . promethazine (PHENERGAN) 25 MG tablet Take 1 tablet (25 mg total) by mouth every 8 (eight) hours as needed for nausea or vomiting.  . tamsulosin (FLOMAX) 0.4 MG CAPS capsule Take 0.4 mg by mouth.  . valACYclovir (VALTREX) 500 MG tablet Take 2 tablets (1,000 mg total) by mouth 2 (two) times daily as needed (cold sores).   No facility-administered medications prior to visit.    Review of Systems  Constitutional: Negative for activity change, chills and fatigue.  Respiratory: Negative for chest tightness and shortness of  breath.   Cardiovascular: Positive for chest pain. Negative for palpitations.  Musculoskeletal: Positive for myalgias.  Neurological: Positive for headaches.    Last CBC Lab Results  Component Value Date   WBC 11.3 (H) 06/08/2019   HGB 13.4 06/08/2019   HCT 41.2 06/08/2019   MCV 96.5 06/08/2019   MCH 31.4 06/08/2019   RDW 12.9 06/08/2019   PLT 247 14/38/8875   Last metabolic panel Lab Results  Component Value Date   GLUCOSE 80 06/08/2019   NA 138 06/08/2019   K 3.9 06/08/2019   CL 104 06/08/2019   CO2 24 06/08/2019   BUN 8 06/08/2019   CREATININE 0.55 06/08/2019   GFRNONAA >60 06/08/2019   GFRAA >60 06/08/2019   CALCIUM 9.2 06/08/2019   PROT 6.5 03/29/2019   ALBUMIN 4.2 03/29/2019   LABGLOB 2.3 03/29/2019   AGRATIO 1.8 03/29/2019   BILITOT 0.2 03/29/2019   ALKPHOS 85  03/29/2019   AST 15 03/29/2019   ALT 13 03/29/2019   ANIONGAP 10 06/08/2019   Last lipids Lab Results  Component Value Date   CHOL 227 (H) 11/25/2018   HDL 64 11/25/2018   LDLCALC 138 (H) 11/25/2018   TRIG 143 11/25/2018   CHOLHDL 3.5 11/25/2018   Last hemoglobin A1c Lab Results  Component Value Date   HGBA1C 5.1 11/25/2018   Last thyroid functions Lab Results  Component Value Date   TSH 1.050 11/25/2018   Last vitamin D Lab Results  Component Value Date   VD25OH 31.5 11/25/2018       Objective    There were no vitals taken for this visit. BP Readings from Last 3 Encounters:  12/13/19 119/82  12/09/19 118/83  11/17/19 125/85   Wt Readings from Last 3 Encounters:  12/13/19 236 lb 3.2 oz (107.1 kg)  12/09/19 200 lb (90.7 kg)  11/15/19 235 lb (106.6 kg)      Physical Exam Vitals reviewed.  Constitutional:      General: She is not in acute distress.    Appearance: Normal appearance. She is well-developed. She is obese. She is not ill-appearing or diaphoretic.  Neck:     Thyroid: No thyromegaly.     Vascular: No JVD.     Trachea: No tracheal deviation.  Cardiovascular:      Rate and Rhythm: Normal rate and regular rhythm.     Heart sounds: Normal heart sounds. No murmur heard. No friction rub. No gallop.   Pulmonary:     Effort: Pulmonary effort is normal. No respiratory distress.     Breath sounds: Normal breath sounds. No wheezing or rales.  Chest:     Chest wall: Tenderness (right side chest tenderness along RSB) present.  Musculoskeletal:     Cervical back: Normal range of motion and neck supple.  Lymphadenopathy:     Cervical: No cervical adenopathy.  Neurological:     Mental Status: She is alert.      No results found for any visits on 05/08/20.  Assessment & Plan     1. Migraine without aura and without status migrainosus, not intractable Stable. Diagnosis pulled for medication refill. Continue current medical treatment plan. - rizatriptan (MAXALT) 10 MG tablet; Take 1 tablet (10 mg total) by mouth as needed for migraine. May repeat in 2 hours if needed  Dispense: 10 tablet; Refill: 5  2. Family history of connective tissue disease Family history and patient feels she may also have Ehler Danlos. Would like referral for echocardiogram to evaluate aortic root while awaiting genetics referral.  - Ambulatory referral to Cardiology   No follow-ups on file.      Reynolds Bowl, PA-C, have reviewed all documentation for this visit. The documentation on 05/08/20 for the exam, diagnosis, procedures, and orders are all accurate and complete.   Rubye Beach  [facility] [phone] (phone) [phone] (fax)  [place]

## 2020-05-10 ENCOUNTER — Other Ambulatory Visit: Payer: Self-pay

## 2020-05-10 ENCOUNTER — Ambulatory Visit (INDEPENDENT_AMBULATORY_CARE_PROVIDER_SITE_OTHER): Payer: BC Managed Care – PPO | Admitting: Internal Medicine

## 2020-05-10 ENCOUNTER — Encounter: Payer: Self-pay | Admitting: Internal Medicine

## 2020-05-10 VITALS — BP 110/76 | HR 88 | Ht 67.0 in | Wt 239.2 lb

## 2020-05-10 DIAGNOSIS — Q796 Ehlers-Danlos syndrome, unspecified: Secondary | ICD-10-CM

## 2020-05-10 DIAGNOSIS — E785 Hyperlipidemia, unspecified: Secondary | ICD-10-CM

## 2020-05-10 NOTE — Progress Notes (Signed)
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 She has a history of arthritis, migraine headaches, GERD, and kidney stones.  [name] reports a history of multiple joint dislocations and fractures going back to early childhood.  She has also had hyperextensible joints and polyarticular arthritis that has raised concerns for a connective tissue disorder such as Ehlers-Danlos syndrome.  She is currently undergoing evaluation by her PCP and orthopedist, and is also waiting consultation with the genetics clinic at [facility] (appointment scheduled for 1 year from now).  Given concern for possible cardiovascular pathology associated with connective tissue disorder, she has been referred to [place] to discuss further evaluation.  [name] denies a history of cardiovascular disease and testing.  She notes occasional chest wall pain but does not have any anginal chest pain.  She is able to exert herself without symptoms such as shortness of breath, chest pain, and lightheadedness.  She notes that her heart rate seems to go up quickly whenever she does something.  She notes some orthostatic lightheadedness.  She has not had any lower extremity edema.  She bruises very easily.  Waiting for gentics consults; plans to see in 1 year.  Multiple fractures and dislocations as a child.  Hyperextensible joints.  Losts of joint pains.  Was tested for SLE after had son but was negative.  Left ankle fracture a few years ago without significant trauma.  [place] saw DDD and arthritis in multiple other joints.  Bruises easily.  No angina but has chest wall soreness and tenderness.  No exertional pain.  HR goes up very quikcly with minimal  activity.  No shortness of breath.  +orthostatic LH.  Father around age 59 -> heart very fast.  ? 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  Risk Factors:  None  Cath/PCI:  None  CV Surgery:  None  EP Procedures and Devices:  None  Non-Invasive Evaluation(s):  None  Recent CV Pertinent Labs: Lab Results  Component Value Date   CHOL 227 (H) 11/25/2018   HDL 64 11/25/2018   LDLCALC 138 (H) 11/25/2018   TRIG 143 11/25/2018   CHOLHDL 3.5 11/25/2018   K 3.9 06/08/2019   K 3.6 06/14/2014   BUN 8 06/08/2019   BUN 7 03/29/2019   BUN 12 06/14/2014   CREATININE 0.55 06/08/2019   CREATININE 0.74 06/14/2014    --------------------------------------------------------------------------------------------------  Past Medical History:  Diagnosis Date  . Acute pyelonephritis 06/29/2012  . Anxiety   . Arthritis   . Asthma    AS A CHILD  . Complication of anesthesia    TROUBLE URINATING IN THE PAST  . Gallstones   . GERD (gastroesophageal reflux disease)   . History of kidney stones   . Kidney stones    bilateral  . Migraine   . Pelvic pain in female   . PONV (postoperative nausea and vomiting)    WITH HYSTERECTOMY ONLY  . Pyelonephritis 2015  . Renal mass 2017   right. considered a normal cyst on kidney.  . Tobacco abuse     Past Surgical History:  Procedure Laterality Date  .  ABDOMINAL HYSTERECTOMY  2006  . ANKLE ARTHROSCOPY Left 2017  . ANKLE ARTHROSCOPY Left 11/24/2015  . APPENDECTOMY  07/26/13  . CAST APPLICATION  12/25/2011   Procedure: CAST APPLICATION;  Surgeon: Javier Docker, MD;  [facility];  Service: Orthopedics;  Laterality: Left;  . CHOLECYSTECTOMY  03/11/2011   Procedure: LAPAROSCOPIC  CHOLECYSTECTOMY WITH INTRAOPERATIVE CHOLANGIOGRAM;  Surgeon: Atilano Ina, MD;  [facility];  Service: General;  Laterality: N/A;  . CHOLECYSTECTOMY, LAPAROSCOPIC    . CYSTOSCOPY/URETEROSCOPY/HOLMIUM LASER/STENT PLACEMENT Left 06/18/2016   Procedure: CYSTOSCOPY/URETEROSCOPY/HOLMIUM LASER/STENT PLACEMENT;  Surgeon: Vanna Scotland, MD;  Location: [facility];  Service: Urology;  Laterality: Left;  . LAPAROSCOPIC OVARIAN CYSTECTOMY Right 09/02/2017   Procedure: LAPAROSCOPIC OVARIAN CYSTECTOMY;  Surgeon: Nadara Mustard, MD;  Location: [facility];  Service: Gynecology;  Laterality: Right;  . LITHOTRIPSY     x 4  . ORIF ANKLE FRACTURE  12/25/2011   Procedure: OPEN REDUCTION INTERNAL FIXATION (ORIF) ANKLE FRACTURE;  Surgeon: Javier Docker, MD;  [facility];  Service: Orthopedics;  Laterality: Left;  . TONSILLECTOMY      Current Meds  Medication Sig  . ALPRAZolam (XANAX) 1 MG tablet TAKE 1/2 TO 1 TABLET BY MOUTH TWICE DAILY AS NEEDED FOR ANXIETY  . gabapentin (NEURONTIN) 800 MG tablet Take 800 mg by mouth 4 (four) times daily.  . metroNIDAZOLE (METROGEL) 0.75 % vaginal gel Use one applicator intravaginally after sexual intercourse  . Multiple Vitamin (MULTIVITAMIN WITH MINERALS) TABS tablet Take 1 tablet by mouth daily.  Marland Kitchen omeprazole (PRILOSEC) 20 MG capsule TAKE 1 CAPSULE BY MOUTH ONCE DAILY  . OnabotulinumtoxinA (BOTOX IJ) Inject 1 each as directed every 3 (three) months. Alternating with trigger point inj  . Oxycodone HCl 10 MG TABS Take by mouth.  . polyethylene glycol (MIRALAX / GLYCOLAX) packet Take 17 g by mouth every other day.   . rizatriptan (MAXALT) 10 MG tablet Take 1 tablet (10 mg total) by mouth as needed for migraine. May repeat in 2 hours if needed  . valACYclovir (VALTREX) 500 MG tablet Take 2 tablets (1,000 mg total) by mouth 2 (two) times daily as needed (cold sores).  . Vitamin D, Ergocalciferol, (DRISDOL) 1.25 MG (50000 UNIT) CAPS capsule TAKE 1 CAPSULE (50,000 UNITS) BY  MOUTH EVERY 7 DAYS    Allergies: Compazine and Metoclopramide  Social History   Tobacco Use  . Smoking status: Former Smoker    Packs/day: 0.50    Years: 15.00    Pack years: 7.50    Types: Cigarettes    Start date: 11/03/1999    Quit date: 09/26/2014    Years since quitting: 5.6  . Smokeless tobacco: Never Used  Vaping Use  . Vaping Use: Never used  Substance Use Topics  . Alcohol use: Not Currently    Alcohol/week: 0.0 standard drinks  . Drug use: No    Family History  Problem Relation Age of Onset  . Diabetes Mother   . Diabetes Father   . Heart failure Father   . Hypertension Father   . Pulmonary fibrosis Father        Secondary to amiodarone  . Nephrolithiasis Father   . Arrhythmia Father        Suspected ventricular tachycardia/fibrillation  . Other Brother        Hypermobile joints  . Kidney cancer Neg Hx   . Bladder Cancer Neg Hx     Review of Systems: A 12-system review of systems was performed and was negative  except as noted in the HPI.  --------------------------------------------------------------------------------------------------  Physical Exam: BP 110/76 (BP Location: Right Arm, Patient Position: Sitting, Cuff Size: Large)   Pulse 88   Ht 5\' 7"  (1.702 m)   Wt 239 lb 4 oz (108.5 kg)   SpO2 98%   BMI 37.47 kg/m   General: NAD. HEENT: No conjunctival pallor or scleral icterus. Facemask in place. Neck: Supple without lymphadenopathy, thyromegaly, JVD, or HJR. No carotid bruit. Lungs: Normal work of breathing. Clear to auscultation bilaterally without wheezes or crackles. Heart: Regular rate and rhythm without murmurs, rubs, or gallops. Non-displaced PMI. Abd: Bowel sounds present. Soft, NT/ND without hepatosplenomegaly Ext: No lower extremity edema. Radial, PT, and DP pulses are 2+ bilaterally Skin: Warm and dry without rash. Neuro: CNIII-XII intact. Strength and fine-touch sensation intact in upper and lower extremities bilaterally. Psych:  Normal mood and affect.  EKG: Normal sinus rhythm with borderline left atrial enlargement.  No significant abnormality noted.  Lab Results  Component Value Date   WBC 11.3 (H) 06/08/2019   HGB 13.4 06/08/2019   HCT 41.2 06/08/2019   MCV 96.5 06/08/2019   PLT 247 06/08/2019    Lab Results  Component Value Date   NA 138 06/08/2019   K 3.9 06/08/2019   CL 104 06/08/2019   CO2 24 06/08/2019   BUN 8 06/08/2019   CREATININE 0.55 06/08/2019   GLUCOSE 80 06/08/2019   ALT 13 03/29/2019    Lab Results  Component Value Date   CHOL 227 (H) 11/25/2018   HDL 64 11/25/2018   LDLCALC 138 (H) 11/25/2018   TRIG 143 11/25/2018   CHOLHDL 3.5 11/25/2018    --------------------------------------------------------------------------------------------------  ASSESSMENT AND PLAN: Ehlers-Danlos syndrome: Patient's history including multiple fractures/dislocations, hyperextensible joints, and easy bruising are all consistent with a connective tissue disorder such as Ehlers-Danlos syndrome.  She is currently awaiting consultation with the genetics clinic at [facility] to confirm this diagnosis.  Given potential for vasculopathy and structural heart disease associated with connective tissue disorders, I agree with further evaluation.  We will begin with a transthoracic echocardiogram, which will allow [facility] to assess her valvular structures and the proximal aorta.  Ultimately, we will likely need to consider cross-sectional imaging of the aorta to ensure exclusion of aneurysms.  [name] is asymptomatic from a cardiovascular standpoint, though I advised her to seek immediate medical attention where she to develop chest/back pain or focal neurologic changes.  Hyperlipidemia: Labs in 10/2018 were notable for mildly elevated total cholesterol and LDL.  Given absence of known ASCVD, I think it is reasonable to pursue lifestyle modifications to help improve lipid profile.  Follow-up: Return to clinic in 1  month.  11/2018, MD 05/12/2020 6:42 AM

## 2020-05-10 NOTE — Patient Instructions (Signed)
Medication Instructions:  Your physician recommends that you continue on your current medications as directed. Please refer to the Current Medication list given to you today.  *If you need a refill on your cardiac medications before your next appointment, please call your pharmacy*  Lab Work: none If you have labs (blood work) drawn today and your tests are completely normal, you will receive your results only by: . MyChart Message (if you have MyChart) OR . A paper copy in the mail If you have any lab test that is abnormal or we need to change your treatment, we will call you to review the results.  Testing/Procedures: Your physician has requested that you have an echocardiogram. Echocardiography is a painless test that uses sound waves to create images of your heart. It provides your doctor with information about the size and shape of your heart and how well your heart's chambers and valves are working. This procedure takes approximately one hour. There are no restrictions for this procedure. There is a possibility that an IV may need to be started during your test to inject an image enhancing agent. This is done to obtain more optimal pictures of your heart. Therefore we ask that you do at least drink some water prior to coming in to hydrate your veins.    Follow-Up: At [facility], you and your health needs are our priority.  As part of our continuing mission to provide you with exceptional heart care, we have created designated Provider Care Teams.  These Care Teams include your primary Cardiologist (physician) and Advanced Practice Providers (APPs -  Physician Assistants and Nurse Practitioners) who all work together to provide you with the care you need, when you need it.  We recommend signing up for the patient portal called "MyChart".  Sign up information is provided on this After Visit Summary.  MyChart is used to connect with patients for Virtual Visits (Telemedicine).  Patients are  able to view lab/test results, encounter notes, upcoming appointments, etc.  Non-urgent messages can be sent to your provider as well.   To learn more about what you can do with MyChart, go to [email or web address].com.    Your next appointment:   1 month(s)  The format for your next appointment:   In Person  Provider:   You may see DR CHRISTOPHER END or one of the following Advanced Practice Providers on your designated Care Team:    Christopher Berge, NP  Ryan Dunn, PA-C  Jacquelyn Visser, PA-C  Cadence Furth, PA-C  Caitlin Walker, NP     Echocardiogram An echocardiogram is a test that uses sound waves (ultrasound) to produce images of the heart. Images from an echocardiogram can provide important information about:  Heart size and shape.  The size and thickness and movement of your heart's walls.  Heart muscle function and strength.  Heart valve function or if you have stenosis. Stenosis is when the heart valves are too narrow.  If blood is flowing backward through the heart valves (regurgitation).  A tumor or infectious growth around the heart valves.  Areas of heart muscle that are not working well because of poor blood flow or injury from a heart attack.  Aneurysm detection. An aneurysm is a weak or damaged part of an artery wall. The wall bulges out from the normal force of blood pumping through the body. Tell a health care provider about:  Any allergies you have.  All medicines you are taking, including vitamins, herbs, eye drops, creams, and   over-the-counter medicines.  Any blood disorders you have.  Any surgeries you have had.  Any medical conditions you have.  Whether you are pregnant or may be pregnant. What are the risks? Generally, this is a safe test. However, problems may occur, including an allergic reaction to dye (contrast) that may be used during the test. What happens before the test? No specific preparation is needed. You may eat and  drink normally. What happens during the test?  You will take off your clothes from the waist up and put on a hospital gown.  Electrodes or electrocardiogram (ECG)patches may be placed on your chest. The electrodes or patches are then connected to a device that monitors your heart rate and rhythm.  You will lie down on a table for an ultrasound exam. A gel will be applied to your chest to help sound waves pass through your skin.  A handheld device, called a transducer, will be pressed against your chest and moved over your heart. The transducer produces sound waves that travel to your heart and bounce back (or "echo" back) to the transducer. These sound waves will be captured in real-time and changed into images of your heart that can be viewed on a video monitor. The images will be recorded on a computer and reviewed by your health care provider.  You may be asked to change positions or hold your breath for a short time. This makes it easier to get different views or better views of your heart.  In some cases, you may receive contrast through an IV in one of your veins. This can improve the quality of the pictures from your heart. The procedure may vary among health care providers and hospitals.   What can I expect after the test? You may return to your normal, everyday life, including diet, activities, and medicines, unless your health care provider tells you not to do that. Follow these instructions at home:  It is up to you to get the results of your test. Ask your health care provider, or the department that is doing the test, when your results will be ready.  Keep all follow-up visits. This is important. Summary  An echocardiogram is a test that uses sound waves (ultrasound) to produce images of the heart.  Images from an echocardiogram can provide important information about the size and shape of your heart, heart muscle function, heart valve function, and other possible heart  problems.  You do not need to do anything to prepare before this test. You may eat and drink normally.  After the echocardiogram is completed, you may return to your normal, everyday life, unless your health care provider tells you not to do that. This information is not intended to replace advice given to you by your health care provider. Make sure you discuss any questions you have with your health care provider. Document Revised: 10/05/2019 Document Reviewed: 10/05/2019 Elsevier Patient Education  2021 [employer].

## 2020-05-12 ENCOUNTER — Encounter: Payer: Self-pay | Admitting: Internal Medicine

## 2020-05-12 DIAGNOSIS — E785 Hyperlipidemia, unspecified: Secondary | ICD-10-CM | POA: Insufficient documentation

## 2020-05-12 DIAGNOSIS — Q796 Ehlers-Danlos syndrome, unspecified: Secondary | ICD-10-CM | POA: Insufficient documentation

## 2020-05-16 ENCOUNTER — Encounter: Payer: Self-pay | Admitting: Urology

## 2020-05-16 ENCOUNTER — Other Ambulatory Visit: Payer: Self-pay | Admitting: *Deleted

## 2020-05-16 DIAGNOSIS — N200 Calculus of kidney: Secondary | ICD-10-CM

## 2020-05-16 NOTE — Progress Notes (Signed)
05/17/2020 4:40 PM   [name] Aug 27, 1980 [id number]  Referring provider: [name], PA-C [address] [place],  [place] [address]  Chief Complaint  Patient presents with  . Nephrolithiasis   Urological history: 1. Nephrolithiasis -ESWL x 4 andURS x 1 -Stone analysis shows 5% calcium oxalate dihydrate, 45% calcium oxalate monohydrate, and 50% calcium phosphate -CT renal stone 10/2019 bilateral nephrolithiasis -KUB 05/17/2020 no stones  2. Intermediate risk hematuria -former smoker -CT renal stone 10/2019 bilateral nephrolithiasis -cysto 10/2019 NED - UA 11-30 RBC's    HPI: [name] is a 40 y.o. female with nephrolithiasis who presents today for follow up.   She had some left-sided flank pain which has subsided with pain medication.  She has not had any passage of fragments.  She feels at this time is more likely musculoskeletal in nature.  Patient denies any modifying or aggravating factors.  Patient denies any gross hematuria, dysuria or suprapubic/flank pain.  Patient denies any fevers, chills, nausea or vomiting.   KUB bilateral renal stones  PMH: Past Medical History:  Diagnosis Date  . Acute pyelonephritis 06/29/2012  . Anxiety   . Arthritis   . Asthma    AS A CHILD  . Complication of anesthesia    TROUBLE URINATING IN THE PAST  . Gallstones   . GERD (gastroesophageal reflux disease)   . History of kidney stones   . Kidney stones    bilateral  . Migraine   . Pelvic pain in female   . PONV (postoperative nausea and vomiting)    WITH HYSTERECTOMY ONLY  . Pyelonephritis 2015  . Renal mass 2017   right. considered a normal cyst on kidney.  . Tobacco abuse     Surgical History: Past Surgical History:  Procedure Laterality Date  . ABDOMINAL HYSTERECTOMY  2006  . ANKLE ARTHROSCOPY Left 2017  . ANKLE ARTHROSCOPY Left 11/24/2015  . APPENDECTOMY  07/26/13  . CAST APPLICATION  12/25/2011   Procedure: CAST  APPLICATION;  Surgeon: Javier Docker, MD;  Location: [facility];  Service: Orthopedics;  Laterality: Left;  . CHOLECYSTECTOMY  03/11/2011   Procedure: LAPAROSCOPIC CHOLECYSTECTOMY WITH INTRAOPERATIVE CHOLANGIOGRAM;  Surgeon: Atilano Ina, MD;  Location[facility];  Service: General;  Laterality: N/A;  . CHOLECYSTECTOMY, LAPAROSCOPIC    . CYSTOSCOPY/URETEROSCOPY/HOLMIUM LASER/STENT PLACEMENT Left 06/18/2016   Procedure: CYSTOSCOPY/URETEROSCOPY/HOLMIUM LASER/STENT PLACEMENT;  Surgeon: Vanna Scotland, MD;  Location: [facility] ORS;  Service: Urology;  Laterality: Left;  . LAPAROSCOPIC OVARIAN CYSTECTOMY Right 09/02/2017   Procedure: LAPAROSCOPIC OVARIAN CYSTECTOMY;  Surgeon: Nadara Mustard, MD;  Location: [facility] ORS;  Service: Gynecology;  Laterality: Right;  . LITHOTRIPSY     x 4  . ORIF ANKLE FRACTURE  12/25/2011   Procedure: OPEN REDUCTION INTERNAL FIXATION (ORIF) ANKLE FRACTURE;  Surgeon: Javier Docker, MD;  Location: [facility];  Service: Orthopedics;  Laterality: Left;  . TONSILLECTOMY      Home Medications:  Allergies as of 05/17/2020      Reactions   Compazine Other (See Comments)   Dystonia   Metoclopramide Other (See Comments)   dystonia      Medication List       Accurate as of May 17, 2020 11:59 PM. If you have any questions, ask your nurse or doctor.        ALPRAZolam 1 MG tablet Commonly known as: XANAX TAKE 1/2 TO 1 TABLET BY MOUTH TWICE DAILY AS NEEDED FOR ANXIETY   BOTOX IJ Inject 1  each as directed every 3 (three) months. Alternating with trigger point inj   gabapentin 800 MG tablet Commonly known as: NEURONTIN Take 800 mg by mouth 4 (four) times daily.   metroNIDAZOLE 0.75 % vaginal gel Commonly known as: METROGEL Use one applicator intravaginally after sexual intercourse   multivitamin with minerals Tabs tablet Take 1 tablet by mouth daily.   omeprazole 20 MG capsule Commonly known as: PRILOSEC TAKE 1 CAPSULE BY MOUTH ONCE DAILY   Oxycodone HCl 10 MG Tabs Take  by mouth.   polyethylene glycol 17 g packet Commonly known as: MIRALAX / GLYCOLAX Take 17 g by mouth every other day.   rizatriptan 10 MG tablet Commonly known as: MAXALT Take 1 tablet (10 mg total) by mouth as needed for migraine. May repeat in 2 hours if needed   valACYclovir 500 MG tablet Commonly known as: VALTREX Take 2 tablets (1,000 mg total) by mouth 2 (two) times daily as needed (cold sores).   Vitamin D (Ergocalciferol) 1.25 MG (50000 UNIT) Caps capsule Commonly known as: DRISDOL TAKE 1 CAPSULE (50,000 UNITS) BY MOUTH EVERY 7 DAYS       Allergies:  Allergies  Allergen Reactions  . Compazine Other (See Comments)    Dystonia  . Metoclopramide Other (See Comments)    dystonia    Family History: Family History  Problem Relation Age of Onset  . Diabetes Mother   . Diabetes Father   . Heart failure Father   . Hypertension Father   . Pulmonary fibrosis Father        Secondary to amiodarone  . Nephrolithiasis Father   . Arrhythmia Father        Suspected ventricular tachycardia/fibrillation  . Other Brother        Hypermobile joints  . Kidney cancer Neg Hx   . Bladder Cancer Neg Hx     Social History:  reports that she quit smoking about 5 years ago. Her smoking use included cigarettes. She started smoking about 20 years ago. She has a 7.50 pack-year smoking history. She has never used smokeless tobacco. She reports previous alcohol use. She reports that she does not use drugs.  ROS: Pertinent ROS in HPI  Physical Exam: BP 130/80   Pulse 72   Ht 5\' 5"  (1.651 m)   Wt 239 lb (108.4 kg)   BMI 39.77 kg/m   Constitutional:  Well nourished. Alert and oriented, No acute distress. HEENT: [place] AT, mask in place.  Trachea midline Cardiovascular: No clubbing, cyanosis, or edema. Respiratory: Normal respiratory effort, no increased work of breathing. Neurologic: Grossly intact, no focal deficits, moving all 4 extremities. Psychiatric: Normal mood and affect.    Laboratory Data: Urinalysis Component     Latest Ref Rng & Units 05/17/2020  Specific Gravity, UA     1.005 - 1.030 1.020  pH, UA     5.0 - 7.5 7.0  Color, UA     Yellow Yellow  Appearance Ur     Clear Clear  Leukocytes,UA     Negative Negative  Protein,UA     Negative/Trace Negative  Glucose, UA     Negative Negative  Ketones, UA     Negative Negative  RBC, UA     Negative 2+ (A)  Bilirubin, UA     Negative Negative  Urobilinogen, Ur     0.2 - 1.0 mg/dL 0.2  Nitrite, UA     Negative Negative  Microscopic Examination      See below:  Component     Latest Ref Rng & Units 05/17/2020  WBC, UA     0 - 5 /hpf None seen  RBC     0 - 2 /hpf 11-30 (A)  Epithelial Cells (non renal)     0 - 10 /hpf 0-10  Mucus, UA     Not Estab. Present (A)  Bacteria, UA     None seen/Few Few   I have reviewed the labs.   Pertinent Imaging: CLINICAL DATA:  Kidney stones.  EXAM: ABDOMEN - 1 VIEW  COMPARISON:  October 08, 2019.  FINDINGS: The bowel gas pattern is normal.  Stable bilateral nephrolithiasis.  IMPRESSION: Stable bilateral nephrolithiasis. No evidence of bowel obstruction or ileus.   Electronically Signed   By: Lupita Raider M.D.   On: 05/18/2020 14:47 I have independently reviewed the films.  See HPI.   Assessment & Plan:    1. Nephrolithiasis -no stones seen on KUB  2. High risk hematuria -Patient's urinalysis results were not available to me at the time of the visit, but they have returned with 11-30 RBCs.  This would place her now at the high risk hematuria group which recommends that she undergo a CT urogram.  I will contact her via MyChart regarding these findings and this recommendation.  I will also send the urine for culture to rule out infection as a causative agent for the microscopic hematuria.  Return for pending urine culture results.  These notes generated with voice recognition software. I apologize for typographical  errors.  Michiel Cowboy, PA-C  [facility] [address]  [address] [place], [place] [address] [phone]

## 2020-05-17 ENCOUNTER — Ambulatory Visit
Admission: RE | Admit: 2020-05-17 | Discharge: 2020-05-17 | Disposition: A | Discharge status: Home / Self Care | Payer: BC Managed Care – PPO | Source: Ambulatory Visit | Attending: Urology | Admitting: Urology

## 2020-05-17 ENCOUNTER — Other Ambulatory Visit: Payer: Self-pay

## 2020-05-17 ENCOUNTER — Encounter: Payer: Self-pay | Admitting: Urology

## 2020-05-17 ENCOUNTER — Ambulatory Visit: Payer: BC Managed Care – PPO | Admitting: Urology

## 2020-05-17 VITALS — BP 130/80 | HR 72 | Ht 65.0 in | Wt 239.0 lb

## 2020-05-17 DIAGNOSIS — R319 Hematuria, unspecified: Secondary | ICD-10-CM

## 2020-05-17 DIAGNOSIS — N200 Calculus of kidney: Secondary | ICD-10-CM | POA: Diagnosis not present

## 2020-05-17 DIAGNOSIS — R3129 Other microscopic hematuria: Secondary | ICD-10-CM

## 2020-05-18 LAB — MICROSCOPIC EXAMINATION: WBC, UA: NONE SEEN /hpf (ref 0–5)

## 2020-05-18 LAB — URINALYSIS, COMPLETE
Bilirubin, UA: NEGATIVE
Glucose, UA: NEGATIVE
Ketones, UA: NEGATIVE
Leukocytes,UA: NEGATIVE
Nitrite, UA: NEGATIVE
Protein,UA: NEGATIVE
Specific Gravity, UA: 1.02 (ref 1.005–1.030)
Urobilinogen, Ur: 0.2 mg/dL (ref 0.2–1.0)
pH, UA: 7 (ref 5.0–7.5)

## 2020-05-20 LAB — CULTURE, URINE COMPREHENSIVE

## 2020-05-22 ENCOUNTER — Other Ambulatory Visit: Payer: Self-pay | Admitting: Urology

## 2020-05-22 DIAGNOSIS — R3129 Other microscopic hematuria: Secondary | ICD-10-CM

## 2020-05-22 NOTE — Progress Notes (Signed)
Orders are in for CT urogram.

## 2020-05-26 ENCOUNTER — Ambulatory Visit (INDEPENDENT_AMBULATORY_CARE_PROVIDER_SITE_OTHER): Payer: BC Managed Care – PPO

## 2020-05-26 ENCOUNTER — Other Ambulatory Visit: Payer: Self-pay

## 2020-05-26 DIAGNOSIS — Q796 Ehlers-Danlos syndrome, unspecified: Secondary | ICD-10-CM

## 2020-05-26 LAB — ECHOCARDIOGRAM COMPLETE
AR max vel: 2.59 cm2
AV Area VTI: 3.03 cm2
AV Area mean vel: 2.18 cm2
AV Mean grad: 2 mmHg
AV Peak grad: 4.2 mmHg
Ao pk vel: 1.02 m/s
Area-P 1/2: 2.95 cm2
Calc EF: 54.8 %
S' Lateral: 3.3 cm
Single Plane A2C EF: 55.1 %
Single Plane A4C EF: 51.1 %

## 2020-06-12 ENCOUNTER — Ambulatory Visit (INDEPENDENT_AMBULATORY_CARE_PROVIDER_SITE_OTHER): Payer: BC Managed Care – PPO | Admitting: Medical

## 2020-06-12 ENCOUNTER — Ambulatory Visit (INDEPENDENT_AMBULATORY_CARE_PROVIDER_SITE_OTHER): Payer: BC Managed Care – PPO

## 2020-06-12 ENCOUNTER — Encounter: Payer: Self-pay | Admitting: Medical

## 2020-06-12 ENCOUNTER — Other Ambulatory Visit: Payer: Self-pay

## 2020-06-12 VITALS — BP 110/90 | HR 92 | Ht 67.0 in | Wt 239.5 lb

## 2020-06-12 DIAGNOSIS — R000 Tachycardia, unspecified: Secondary | ICD-10-CM

## 2020-06-12 DIAGNOSIS — Q796 Ehlers-Danlos syndrome, unspecified: Secondary | ICD-10-CM | POA: Diagnosis not present

## 2020-06-12 DIAGNOSIS — R002 Palpitations: Secondary | ICD-10-CM | POA: Diagnosis not present

## 2020-06-12 DIAGNOSIS — R42 Dizziness and giddiness: Secondary | ICD-10-CM | POA: Diagnosis not present

## 2020-06-12 DIAGNOSIS — E785 Hyperlipidemia, unspecified: Secondary | ICD-10-CM | POA: Diagnosis not present

## 2020-06-12 NOTE — Progress Notes (Signed)
Cardiology Office Note:    Date:  06/12/2020   ID:  [name], DOB [birth year], MRN [id number]  PCP:  [name]  [facility] Cardiologist:  No primary care provider on file.  [facility] Electrophysiologist:  None   Referring MD: [name]*   Chief Complaint: 1 month follow-up  History of Present Illness:    [name] is a 40 y.o. female with a hx of arthritis, migraine headaches, GERD, kidney stones presents for follow-up for possible Ehlers-Danlos Syndrome.  Patient was last seen 05/10/2020 for suspected Ehlers-Danlos syndrome, undergoing work-up by PCP and orthopedist while waiting for genetic testing.  He was asymptomatic from a cardiac standpoint.  An echo was ordered.  Might need cross-sectional imaging of the aorta to ensure exclusion of aneurysms.  Encouraged patient to see neurology.  Echo showed LVEF 60 to 65%, grade 1 diastolic dysfunction, mild MR normal aortic measurements.  Today, the patient reports she has been overall feeling normal. Still waiting to get into genetics, she has waited a year and still has another year to go. She has chronic pain and sees pain psychiatrist. She started making adjustments herself to try and manage symptoms. Denies chest pain or SOB. Only time she feels anything is when she lays down and hears palpitations. This occurs every night. Sometimes keeps her from sleeping. Described as a fluttering. Also if she stands up too fast she has some lightheadedness. Denies lower leg edema, orthopnea, pnd.    Past Medical History:  Diagnosis Date  . Acute pyelonephritis 06/29/2012  . Anxiety   . Arthritis   . Asthma    AS A CHILD  . Complication of anesthesia    TROUBLE URINATING IN THE PAST  . Gallstones   . GERD (gastroesophageal reflux disease)   . History of kidney stones   . Kidney stones    bilateral  . Migraine   . Pelvic pain in female   . PONV (postoperative nausea and vomiting)     WITH HYSTERECTOMY ONLY  . Pyelonephritis 2015  . Renal mass 2017   right. considered a normal cyst on kidney.  . Tobacco abuse     Past Surgical History:  Procedure Laterality Date  . ABDOMINAL HYSTERECTOMY  2006  . ANKLE ARTHROSCOPY Left 2017  . ANKLE ARTHROSCOPY Left 11/24/2015  . APPENDECTOMY  07/26/13  . CAST APPLICATION  12/25/2011   Procedure: CAST APPLICATION;  Surgeon: Javier Docker, MD;  Location: [facility];  Service: Orthopedics;  Laterality: Left;  . CHOLECYSTECTOMY  03/11/2011   Procedure: LAPAROSCOPIC CHOLECYSTECTOMY WITH INTRAOPERATIVE CHOLANGIOGRAM;  Surgeon: Atilano Ina, MD;  Location: [facility] OR;  Service: General;  Laterality: N/A;  . CHOLECYSTECTOMY, LAPAROSCOPIC    . CYSTOSCOPY/URETEROSCOPY/HOLMIUM LASER/STENT PLACEMENT Left 06/18/2016   Procedure: CYSTOSCOPY/URETEROSCOPY/HOLMIUM LASER/STENT PLACEMENT;  Surgeon: Vanna Scotland, MD;  Location: [facility] ORS;  Service: Urology;  Laterality: Left;  . LAPAROSCOPIC OVARIAN CYSTECTOMY Right 09/02/2017   Procedure: LAPAROSCOPIC OVARIAN CYSTECTOMY;  Surgeon: Nadara Mustard, MD;  Location: [facility] ORS;  Service: Gynecology;  Laterality: Right;  . LITHOTRIPSY     x 4  . ORIF ANKLE FRACTURE  12/25/2011   Procedure: OPEN REDUCTION INTERNAL FIXATION (ORIF) ANKLE FRACTURE;  Surgeon: Javier Docker, MD;  Location: [facility];  Service: Orthopedics;  Laterality: Left;  . TONSILLECTOMY      Current Medications: Current Meds  Medication Sig  . ALPRAZolam (XANAX) 1 MG tablet TAKE 1/2 TO 1 TABLET BY MOUTH TWICE DAILY AS  NEEDED FOR ANXIETY  . gabapentin (NEURONTIN) 800 MG tablet Take 800 mg by mouth 4 (four) times daily.  . metroNIDAZOLE (METROGEL) 0.75 % vaginal gel Use one applicator intravaginally after sexual intercourse  . Multiple Vitamin (MULTIVITAMIN WITH MINERALS) TABS tablet Take 1 tablet by mouth daily.  Marland Kitchen omeprazole (PRILOSEC) 20 MG capsule TAKE 1 CAPSULE BY MOUTH ONCE DAILY  . OnabotulinumtoxinA (BOTOX IJ) Inject 1 each as directed  every 3 (three) months. Alternating with trigger point inj  . Oxycodone HCl 10 MG TABS Take by mouth.  . polyethylene glycol (MIRALAX / GLYCOLAX) packet Take 17 g by mouth every other day.   . rizatriptan (MAXALT) 10 MG tablet Take 1 tablet (10 mg total) by mouth as needed for migraine. May repeat in 2 hours if needed  . valACYclovir (VALTREX) 500 MG tablet Take 2 tablets (1,000 mg total) by mouth 2 (two) times daily as needed (cold sores).  . Vitamin D, Ergocalciferol, (DRISDOL) 1.25 MG (50000 UNIT) CAPS capsule TAKE 1 CAPSULE (50,000 UNITS) BY MOUTH EVERY 7 DAYS     Allergies:   Compazine and Metoclopramide   Social History   Socioeconomic History  . Marital status: Married    Spouse name: [name]  . Number of children: Not on file  . Years of education: Not on file  . Highest education level: Not on file  Occupational History  . Not on file  Tobacco Use  . Smoking status: Former Smoker    Packs/day: 0.50    Years: 15.00    Pack years: 7.50    Types: Cigarettes    Start date: 11/03/1999    Quit date: 09/26/2014    Years since quitting: 5.7  . Smokeless tobacco: Never Used  Vaping Use  . Vaping Use: Never used  Substance and Sexual Activity  . Alcohol use: Not Currently    Alcohol/week: 0.0 standard drinks  . Drug use: No  . Sexual activity: Yes    Birth control/protection: Surgical    Comment: Hysterectomy   Other Topics Concern  . Not on file  Social History Narrative  . Not on file   Social Determinants of Health   Financial Resource Strain: Not on file  Food Insecurity: Not on file  Transportation Needs: Not on file  Physical Activity: Not on file  Stress: Not on file  Social Connections: Not on file     Family History: The patient's *family history includes Arrhythmia in her father; Diabetes in her father and mother; Heart failure in her father; Hypertension in her father; Nephrolithiasis in her father; Other in her brother; Pulmonary fibrosis in her father.  There is no history of Kidney cancer or Bladder Cancer.  ROS:   Please see the history of present illness.     All other systems reviewed and are negative.  EKGs/Labs/Other Studies Reviewed:    The following studies were reviewed today:  Echo 05/26/20 1. Left ventricular ejection fraction, by estimation, is 60 to 65%. The  left ventricle has normal function. The left ventricle has no regional  wall motion abnormalities. Left ventricular diastolic parameters are  consistent with Grade I diastolic  dysfunction (impaired relaxation).  2. Right ventricular systolic function is normal. The right ventricular  size is normal.  3. The mitral valve is normal in structure. Mild mitral valve  regurgitation. No evidence of mitral stenosis.   EKG:  EKG is  ordered today.  The ekg ordered today demonstrates NSR, 92bpm, no change  Recent Labs: No results  found for requested labs within last 8760 hours.  Recent Lipid Panel    Component Value Date/Time   CHOL 227 (H) 11/25/2018 1404   TRIG 143 11/25/2018 1404   HDL 64 11/25/2018 1404   CHOLHDL 3.5 11/25/2018 1404   LDLCALC 138 (H) 11/25/2018 1404     Physical Exam:    VS:  BP 110/90 (BP Location: Left Arm, Patient Position: Sitting, Cuff Size: Large)   Pulse 92   Ht 5\' 7"  (1.702 m)   Wt 239 lb 8 oz (108.6 kg)   SpO2 98%   BMI 37.51 kg/m     Wt Readings from Last 3 Encounters:  06/12/20 239 lb 8 oz (108.6 kg)  05/17/20 239 lb (108.4 kg)  05/10/20 239 lb 4 oz (108.5 kg)     GEN:  Well nourished, well developed in no acute distress HEENT: Normal NECK: No JVD; No carotid bruits LYMPHATICS: No lymphadenopathy CARDIAC: RRR, no murmurs, rubs, gallops RESPIRATORY:  Clear to auscultation without rales, wheezing or rhonchi  ABDOMEN: Soft, non-tender, non-distended MUSCULOSKELETAL:  No edema; No deformity  SKIN: Warm and dry NEUROLOGIC:  Alert and oriented x 3 PSYCHIATRIC:  Normal affect   ASSESSMENT:    1. Ehlers-Danlos  syndrome   2. Hyperlipidemia, unspecified hyperlipidemia type   3. Heart rate fast   4. Dizziness on standing    PLAN:    In order of problems listed above:  Possible Ehlers-Danlos syndrome Still awaiting appointment for genetic testing, she has a year left to wait. Echo reviewed which shows normal EF and no aortic abnormalities. She denies chest, SOB, LLe, orthopnea, pnd. Dr. 05/12/20 suggested possible further imaging of aorta, will check with him regarding further work-up at this time.   Hyperlipidemia LDL 112. Lifestyle changes encoruaged  Palpitations She experiences heart fluttering daily, mainly at night when she lays down. EKG shows NSR heart rate 92bpm. No associated symptoms. Check  TSH. Will get a 7 day heart monitor and see patient back after this.  Dizziness Reports dizziness upon standing. BP wnl. Not on antihypertensives at baseline. Orthostatics negative, although did have some dizziness upon standing and was mildlyl tachycardic. Recommend good hydration.    Disposition: Follow up after heart monitor with APP/MD   Signed, Jos Cygan Okey Dupre, PA-C  06/12/2020 3:03 PM    [facility]

## 2020-06-12 NOTE — Patient Instructions (Addendum)
Medication Instructions:  Your physician recommends that you continue on your current medications as directed. Please refer to the Current Medication list given to you today.  *If you need a refill on your cardiac medications before your next appointment, please call your pharmacy*   Lab Work: None ordered  If you have labs (blood work) drawn today and your tests are completely normal, you will receive your results only by: Marland Kitchen MyChart Message (if you have MyChart) OR . A paper copy in the mail If you have any lab test that is abnormal or we need to change your treatment, we will call you to review the results.   Testing/Procedures: Christena Deem- Long Term Monitor Instructions   Your physician has requested you wear your ZIO patch monitor 7 days.   Do not shower for the first 24 hours.  You may shower after the first 24 hours.   Press button if you feel a symptom. You will hear a small click.  Record Date, Time and Symptom in the Patient Log Book.   When you are ready to remove patch, follow instructions on last 2 pages of Patient Log Book.  Stick patch monitor onto last page of Patient Log Book.   Place Patient Log Book in [place] box.  Use locking tab on box and tape box closed securely.  The [employer] and [employer] has [employer] on it.  Please place in mailbox as soon as possible.  Your physician should have your test results approximately 7 days after the monitor has been mailed back to [facility].   Call [facility] Customer Care at [phone] if you have questions regarding your ZIO XT patch monitor.  Call them immediately if you see an orange light blinking on your monitor.   If your monitor falls off in less than 4 days contact our Monitor department at [phone].  If your monitor becomes loose or falls off after 4 days call [employer] at [phone] for suggestions on securing your monitor.   3   Follow-Up: At [facility], you and your health needs are our  priority.  As part of our continuing mission to provide you with exceptional heart care, we have created designated Provider Care Teams.  These Care Teams include your primary Cardiologist (physician) and Advanced Practice Providers (APPs -  Physician Assistants and Nurse Practitioners) who all work together to provide you with the care you need, when you need it.  We recommend signing up for the patient portal called "MyChart".  Sign up information is provided on this After Visit Summary.  MyChart is used to connect with patients for Virtual Visits (Telemedicine).  Patients are able to view lab/test results, encounter notes, upcoming appointments, etc.  Non-urgent messages can be sent to your provider as well.   To learn more about what you can do with MyChart, go to [email or web address].    Your next appointment:   2 month(s)  The format for your next appointment:   In Person  Provider:   You may see Yvonne Kendall, MD, or one of the following Advanced Practice Providers on your designated Care Team:    Nicolasa Ducking, NP  Eula Listen, PA-C  Marisue Ivan, PA-C  Cadence Mona, [place]  Gillian Shields, NP    Other Instructions

## 2020-06-13 LAB — TSH: TSH: 1.61 u[IU]/mL (ref 0.450–4.500)

## 2020-06-19 ENCOUNTER — Telehealth: Payer: Self-pay | Admitting: Urology

## 2020-06-19 DIAGNOSIS — R000 Tachycardia, unspecified: Secondary | ICD-10-CM | POA: Diagnosis not present

## 2020-06-19 NOTE — Telephone Encounter (Signed)
Would you call Mrs. Wyly and let her know we have been trying to reach her to schedule the CT scan for her?

## 2020-06-23 DIAGNOSIS — R000 Tachycardia, unspecified: Secondary | ICD-10-CM | POA: Diagnosis not present

## 2020-07-06 ENCOUNTER — Telehealth: Payer: Self-pay | Admitting: *Deleted

## 2020-07-06 NOTE — Telephone Encounter (Signed)
Left voicemail message to call back for review of results.  

## 2020-07-06 NOTE — Telephone Encounter (Signed)
-----   Message from Cadence David Stall, PA-C sent at 07/06/2020  9:48 AM EDT ----- Heart monitor showed predominately NSR, rare PACs/PVCs, no significant arrythmias. Triggered events correspond to SR.

## 2020-07-06 NOTE — Telephone Encounter (Signed)
Attempted to call pt to review results. No answer. Left message on vm with normal results below.  Per DPR ok to leave detailed vm. Asked pt to contact office with any questions.

## 2020-07-28 DIAGNOSIS — M47817 Spondylosis without myelopathy or radiculopathy, lumbosacral region: Secondary | ICD-10-CM | POA: Diagnosis not present

## 2020-07-28 DIAGNOSIS — Z5181 Encounter for therapeutic drug level monitoring: Secondary | ICD-10-CM | POA: Diagnosis not present

## 2020-07-28 DIAGNOSIS — F418 Other specified anxiety disorders: Secondary | ICD-10-CM | POA: Diagnosis not present

## 2020-07-28 DIAGNOSIS — Z79899 Other long term (current) drug therapy: Secondary | ICD-10-CM | POA: Diagnosis not present

## 2020-07-28 DIAGNOSIS — M5416 Radiculopathy, lumbar region: Secondary | ICD-10-CM | POA: Diagnosis not present

## 2020-08-29 ENCOUNTER — Ambulatory Visit: Payer: BC Managed Care – PPO | Admitting: Medical

## 2020-08-29 NOTE — Progress Notes (Deleted)
Cardiology Office Note:    Date:  08/29/2020   ID:  [name], DOB 29-Dec-1980, MRN [id number]  PCP:  [name]  [facility] Cardiologist:  None  [facility] Electrophysiologist:  None   Referring MD: [name]*   Chief Complaint: 2 month follow-up  History of Present Illness:    [name] is a 40 y.o. 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   Patient was seen 05/10/20 for suspected Ehlors-Danlos syndrome, undergoing work-up by PCP. She has been waiting for genetic testing. She was asymptomatic from a cardiac standpoint. Echo was ordered which showed LVEF 60-65%, G1DD, mild MR, normal aortic measurements.   Last seen 06/12/20 and continued to feel overall well. Noted palpitations and heart monitor was ordered. Also had some dizziness of standing, negative orthostatics in the office. Per Dr. Okey Dupre recommendations, further imaging of aorta was deferred.   Heart monitor 06/12/20 showed predominately NSR with rare PVCs and PACs, no significant arrhythmia.   Today,    Past Medical History:  Diagnosis Date   Acute pyelonephritis 06/29/2012   Anxiety    Arthritis    Asthma    AS A CHILD   Complication of anesthesia    TROUBLE URINATING IN THE PAST   Gallstones    GERD (gastroesophageal reflux disease)    History of kidney stones    Kidney stones    bilateral   Migraine    Pelvic pain in female    PONV (postoperative nausea and vomiting)    WITH HYSTERECTOMY ONLY   Pyelonephritis 2015   Renal mass 2017   right. considered a normal cyst on kidney.   Tobacco abuse     Past Surgical History:  Procedure Laterality Date   ABDOMINAL HYSTERECTOMY  2006   ANKLE ARTHROSCOPY Left 2017   ANKLE ARTHROSCOPY Left 11/24/2015   APPENDECTOMY  07/26/13   CAST APPLICATION  12/25/2011   Procedure: CAST APPLICATION;  Surgeon: Javier Docker, MD;  Location: [facility];  Service:  Orthopedics;  Laterality: Left;   CHOLECYSTECTOMY  03/11/2011   Procedure: LAPAROSCOPIC CHOLECYSTECTOMY WITH INTRAOPERATIVE CHOLANGIOGRAM;  Surgeon: Atilano Ina, MD;  Location: [facility] OR;  Service: General;  Laterality: N/A;   CHOLECYSTECTOMY, LAPAROSCOPIC     CYSTOSCOPY/URETEROSCOPY/HOLMIUM LASER/STENT PLACEMENT Left 06/18/2016   Procedure: CYSTOSCOPY/URETEROSCOPY/HOLMIUM LASER/STENT PLACEMENT;  Surgeon: Vanna Scotland, MD;  Location: [facility] ORS;  Service: Urology;  Laterality: Left;   LAPAROSCOPIC OVARIAN CYSTECTOMY Right 09/02/2017   Procedure: LAPAROSCOPIC OVARIAN CYSTECTOMY;  Surgeon: Nadara Mustard, MD;  Location: [facility] ORS;  Service: Gynecology;  Laterality: Right;   LITHOTRIPSY     x 4   ORIF ANKLE FRACTURE  12/25/2011   Procedure: OPEN REDUCTION INTERNAL FIXATION (ORIF) ANKLE FRACTURE;  Surgeon: Javier Docker, MD;  Location: [facility];  Service: Orthopedics;  Laterality: Left;   TONSILLECTOMY      Current Medications: No outpatient medications have been marked as taking for the 08/29/20 encounter (Appointment) with [name], [name] H, PA-C.     Allergies:   Compazine and Metoclopramide   Social History   Socioeconomic History   Marital status: Married    Spouse name: [name]   Number of children: Not on file   Years of education: Not on file   Highest education level: Not on file  Occupational History   Not on file  Tobacco Use   Smoking status: Former    Packs/day: 0.50  Years: 15.00    Pack years: 7.50    Types: Cigarettes    Start date: 11/03/1999    Quit date: 09/26/2014    Years since quitting: 5.9   Smokeless tobacco: Never  Vaping Use   Vaping Use: Never used  Substance and Sexual Activity   Alcohol use: Not Currently    Alcohol/week: 0.0 standard drinks   Drug use: No   Sexual activity: Yes    Birth control/protection: Surgical    Comment: Hysterectomy   Other Topics Concern   Not on file  Social History Narrative   Not on file   Social Determinants of Health    Financial Resource Strain: Not on file  Food Insecurity: Not on file  Transportation Needs: Not on file  Physical Activity: Not on file  Stress: Not on file  Social Connections: Not on file     Family History: The patient's ***family history includes Arrhythmia in her father; Diabetes in her father and mother; Heart failure in her father; Hypertension in her father; Nephrolithiasis in her father; Other in her brother; Pulmonary fibrosis in her father. There is no history of Kidney cancer or Bladder Cancer.  ROS:   Please see the history of present illness.    *** All other systems reviewed and are negative.  EKGs/Labs/Other Studies Reviewed:    The following studies were reviewed today: ***  EKG:  EKG is *** ordered today.  The ekg ordered today demonstrates ***  Recent Labs: 06/12/2020: TSH 1.610  Recent Lipid Panel    Component Value Date/Time   CHOL 227 (H) 11/25/2018 1404   TRIG 143 11/25/2018 1404   HDL 64 11/25/2018 1404   CHOLHDL 3.5 11/25/2018 1404   LDLCALC 138 (H) 11/25/2018 1404     Risk Assessment/Calculations:   {Does this patient have ATRIAL FIBRILLATION?:[phone]}   Physical Exam:    VS:  There were no vitals taken for this visit.    Wt Readings from Last 3 Encounters:  06/12/20 239 lb 8 oz (108.6 kg)  05/17/20 239 lb (108.4 kg)  05/10/20 239 lb 4 oz (108.5 kg)     GEN: *** Well nourished, well developed in no acute distress HEENT: Normal NECK: No JVD; No carotid bruits LYMPHATICS: No lymphadenopathy CARDIAC: ***RRR, no murmurs, rubs, gallops RESPIRATORY:  Clear to auscultation without rales, wheezing or rhonchi  ABDOMEN: Soft, non-tender, non-distended MUSCULOSKELETAL:  No edema; No deformity  SKIN: Warm and dry NEUROLOGIC:  Alert and oriented x 3 PSYCHIATRIC:  Normal affect   ASSESSMENT:    No diagnosis found. PLAN:    In order of problems listed above:  Possible Ehlers-Danlos syndrome  HLD LDL 112. Continue lifestyle  modifications  Palpitations Prior TSH normal. Heart monitor with NSR and rare PACS, PVCs, no significant arrythmia  Dizziness  Disposition: Follow up {follow up:15908} with ***   Shared Decision Making/Informed Consent   {Are you ordering a CV Procedure (e.g. stress test, cath, DCCV, TEE, etc)?   Press F2        :[id number]}    Signed, Daisha Filosa David Stall, PA-C  08/29/2020 9:15 AM    [facility]

## 2020-08-30 ENCOUNTER — Other Ambulatory Visit: Payer: Self-pay | Admitting: *Deleted

## 2020-08-30 ENCOUNTER — Encounter: Payer: Self-pay | Admitting: Medical

## 2020-08-30 DIAGNOSIS — R3129 Other microscopic hematuria: Secondary | ICD-10-CM

## 2020-09-01 ENCOUNTER — Other Ambulatory Visit: Payer: Self-pay | Admitting: Physician Assistant

## 2020-09-01 DIAGNOSIS — F5101 Primary insomnia: Secondary | ICD-10-CM

## 2020-09-01 DIAGNOSIS — F411 Generalized anxiety disorder: Secondary | ICD-10-CM

## 2020-09-07 DIAGNOSIS — M791 Myalgia, unspecified site: Secondary | ICD-10-CM | POA: Diagnosis not present

## 2020-09-07 DIAGNOSIS — M47817 Spondylosis without myelopathy or radiculopathy, lumbosacral region: Secondary | ICD-10-CM | POA: Diagnosis not present

## 2020-09-07 DIAGNOSIS — M5416 Radiculopathy, lumbar region: Secondary | ICD-10-CM | POA: Diagnosis not present

## 2020-09-07 DIAGNOSIS — Z79899 Other long term (current) drug therapy: Secondary | ICD-10-CM | POA: Diagnosis not present

## 2020-09-18 ENCOUNTER — Ambulatory Visit: Admission: RE | Admit: 2020-09-18 | Payer: BC Managed Care – PPO | Source: Ambulatory Visit

## 2020-09-20 ENCOUNTER — Other Ambulatory Visit: Payer: Self-pay

## 2020-09-20 ENCOUNTER — Ambulatory Visit
Admission: RE | Admit: 2020-09-20 | Discharge: 2020-09-20 | Disposition: A | Discharge status: Home / Self Care | Payer: BC Managed Care – PPO | Source: Ambulatory Visit | Attending: Urology | Admitting: Urology

## 2020-09-20 DIAGNOSIS — R3129 Other microscopic hematuria: Secondary | ICD-10-CM

## 2020-09-20 DIAGNOSIS — N200 Calculus of kidney: Secondary | ICD-10-CM | POA: Diagnosis not present

## 2020-09-20 DIAGNOSIS — N289 Disorder of kidney and ureter, unspecified: Secondary | ICD-10-CM | POA: Diagnosis not present

## 2020-09-20 DIAGNOSIS — K753 Granulomatous hepatitis, not elsewhere classified: Secondary | ICD-10-CM | POA: Diagnosis not present

## 2020-09-20 MED ORDER — IOHEXOL 350 MG/ML SOLN
150.0000 mL | Freq: Once | INTRAVENOUS | Status: AC | PRN
  Administered 2020-09-20: 150 mL via INTRAVENOUS

## 2020-09-25 DIAGNOSIS — M47817 Spondylosis without myelopathy or radiculopathy, lumbosacral region: Secondary | ICD-10-CM | POA: Diagnosis not present

## 2020-09-25 DIAGNOSIS — M461 Sacroiliitis, not elsewhere classified: Secondary | ICD-10-CM | POA: Diagnosis not present

## 2020-09-27 DIAGNOSIS — M47817 Spondylosis without myelopathy or radiculopathy, lumbosacral region: Secondary | ICD-10-CM | POA: Diagnosis not present

## 2020-09-27 DIAGNOSIS — F418 Other specified anxiety disorders: Secondary | ICD-10-CM | POA: Diagnosis not present

## 2020-09-27 DIAGNOSIS — Z79899 Other long term (current) drug therapy: Secondary | ICD-10-CM | POA: Diagnosis not present

## 2020-09-27 DIAGNOSIS — M5416 Radiculopathy, lumbar region: Secondary | ICD-10-CM | POA: Diagnosis not present

## 2020-10-09 DIAGNOSIS — M792 Neuralgia and neuritis, unspecified: Secondary | ICD-10-CM | POA: Diagnosis not present

## 2020-10-09 DIAGNOSIS — M65872 Other synovitis and tenosynovitis, left ankle and foot: Secondary | ICD-10-CM | POA: Diagnosis not present

## 2020-10-09 DIAGNOSIS — M5416 Radiculopathy, lumbar region: Secondary | ICD-10-CM | POA: Diagnosis not present

## 2020-10-09 DIAGNOSIS — S82852D Displaced trimalleolar fracture of left lower leg, subsequent encounter for closed fracture with routine healing: Secondary | ICD-10-CM | POA: Diagnosis not present

## 2020-10-12 ENCOUNTER — Other Ambulatory Visit: Payer: Self-pay | Admitting: Orthopedic Surgery

## 2020-10-12 DIAGNOSIS — S82852S Displaced trimalleolar fracture of left lower leg, sequela: Secondary | ICD-10-CM

## 2020-10-20 ENCOUNTER — Ambulatory Visit
Admission: RE | Admit: 2020-10-20 | Discharge: 2020-10-20 | Disposition: A | Discharge status: Home / Self Care | Payer: BC Managed Care – PPO | Source: Ambulatory Visit | Attending: Orthopedic Surgery | Admitting: Orthopedic Surgery

## 2020-10-20 DIAGNOSIS — S82852S Displaced trimalleolar fracture of left lower leg, sequela: Secondary | ICD-10-CM | POA: Diagnosis not present

## 2020-10-20 DIAGNOSIS — M24172 Other articular cartilage disorders, left ankle: Secondary | ICD-10-CM | POA: Diagnosis not present

## 2020-10-20 DIAGNOSIS — T8484XA Pain due to internal orthopedic prosthetic devices, implants and grafts, initial encounter: Secondary | ICD-10-CM | POA: Diagnosis not present

## 2020-10-20 DIAGNOSIS — M25472 Effusion, left ankle: Secondary | ICD-10-CM | POA: Insufficient documentation

## 2020-10-20 DIAGNOSIS — Y793 Surgical instruments, materials and orthopedic devices (including sutures) associated with adverse incidents: Secondary | ICD-10-CM | POA: Diagnosis not present

## 2020-10-20 DIAGNOSIS — M7989 Other specified soft tissue disorders: Secondary | ICD-10-CM | POA: Diagnosis not present

## 2020-10-23 DIAGNOSIS — S82852D Displaced trimalleolar fracture of left lower leg, subsequent encounter for closed fracture with routine healing: Secondary | ICD-10-CM | POA: Diagnosis not present

## 2020-11-06 DIAGNOSIS — M545 Low back pain, unspecified: Secondary | ICD-10-CM | POA: Diagnosis not present

## 2020-11-06 DIAGNOSIS — S39012A Strain of muscle, fascia and tendon of lower back, initial encounter: Secondary | ICD-10-CM | POA: Diagnosis not present

## 2020-11-06 DIAGNOSIS — M5416 Radiculopathy, lumbar region: Secondary | ICD-10-CM | POA: Diagnosis not present

## 2020-11-10 ENCOUNTER — Other Ambulatory Visit: Payer: Self-pay | Admitting: Psychiatry

## 2020-11-10 DIAGNOSIS — M5416 Radiculopathy, lumbar region: Secondary | ICD-10-CM

## 2020-11-16 ENCOUNTER — Other Ambulatory Visit: Payer: Self-pay

## 2020-11-16 ENCOUNTER — Ambulatory Visit
Admission: RE | Admit: 2020-11-16 | Discharge: 2020-11-16 | Disposition: A | Discharge status: Home / Self Care | Payer: BC Managed Care – PPO | Source: Ambulatory Visit | Attending: Psychiatry | Admitting: Psychiatry

## 2020-11-16 DIAGNOSIS — M5416 Radiculopathy, lumbar region: Secondary | ICD-10-CM

## 2020-11-16 DIAGNOSIS — M545 Low back pain, unspecified: Secondary | ICD-10-CM | POA: Diagnosis not present

## 2020-11-21 NOTE — Progress Notes (Signed)
11/22/2020 7:08 PM   [name] 1980-09-13 [id number]  Referring provider: [name], PA-C [address] [place],  [place] [address]  Chief Complaint  Patient presents with   Nephrolithiasis    Urological history: 1. Nephrolithiasis -ESWL x 4 and URS x 1 -Stone analysis shows 5% calcium oxalate dihydrate, 45% calcium oxalate monohydrate, and 50% calcium phosphate -CT urogram 08/2020 - No hydronephrosis. Small nonobstructive bilateral renal stones measuring up to 4 mm in the lower pole on the right and 3 mm in the lower pole on the left  2. Intermediate risk hematuria -former smoker -CT renal stone 10/2019 bilateral nephrolithiasis -cysto 10/2019 NED -CTU 08/2020 - Bilateral nonobstructive nephrolithiasis -no reports of gross heme -UA negative for micro heme   HPI: [name] is a 40 y.o. female with nephrolithiasis who presents today for a 6 month follow up.   She has not urinary complaints at this visit.  Patient denies any modifying or aggravating factors.  Patient denies any gross hematuria, dysuria or suprapubic/flank pain.  Patient denies any fevers, chills, nausea or vomiting.    UA negative for micro heme  KUB bilateral nephrolithiasis   PMH: Past Medical History:  Diagnosis Date   Acute pyelonephritis 06/29/2012   Anxiety    Arthritis    Asthma    AS A CHILD   Complication of anesthesia    TROUBLE URINATING IN THE PAST   Gallstones    GERD (gastroesophageal reflux disease)    History of kidney stones    Kidney stones    bilateral   Migraine    Pelvic pain in female    PONV (postoperative nausea and vomiting)    WITH HYSTERECTOMY ONLY   Pyelonephritis 2015   Renal mass 2017   right. considered a normal cyst on kidney.   Tobacco abuse     Surgical History: Past Surgical History:  Procedure Laterality Date   ABDOMINAL HYSTERECTOMY  2006   ANKLE ARTHROSCOPY Left 2017   ANKLE ARTHROSCOPY Left 11/24/2015    APPENDECTOMY  07/26/13   CAST APPLICATION  12/25/2011   Procedure: CAST APPLICATION;  Surgeon: Javier Docker, MD;  Location: [facility];  Service: Orthopedics;  Laterality: Left;   CHOLECYSTECTOMY  03/11/2011   Procedure: LAPAROSCOPIC CHOLECYSTECTOMY WITH INTRAOPERATIVE CHOLANGIOGRAM;  Surgeon: Atilano Ina, MD;  Location: [facility] OR;  Service: General;  Laterality: N/A;   CHOLECYSTECTOMY, LAPAROSCOPIC     CYSTOSCOPY/URETEROSCOPY/HOLMIUM LASER/STENT PLACEMENT Left 06/18/2016   Procedure: CYSTOSCOPY/URETEROSCOPY/HOLMIUM LASER/STENT PLACEMENT;  Surgeon: Vanna Scotland, MD;  Location: [facility];  Service: Urology;  Laterality: Left;   LAPAROSCOPIC OVARIAN CYSTECTOMY Right 09/02/2017   Procedure: LAPAROSCOPIC OVARIAN CYSTECTOMY;  Surgeon: Nadara Mustard, MD;  Location: [facility];  Service: Gynecology;  Laterality: Right;   LITHOTRIPSY     x 4   ORIF ANKLE FRACTURE  12/25/2011   Procedure: OPEN REDUCTION INTERNAL FIXATION (ORIF) ANKLE FRACTURE;  Surgeon: Javier Docker, MD;  Location: [facility];  Service: Orthopedics;  Laterality: Left;   TONSILLECTOMY      Home Medications:  Allergies as of 11/22/2020       Reactions   Compazine Other (See Comments)   Dystonia   Metoclopramide Other (See Comments)   dystonia        Medication List        Accurate as of November 22, 2020 11:59 PM. If you have any questions, ask your nurse or doctor.  ALPRAZolam 1 MG tablet Commonly known as: XANAX TAKE 1/2 TO 1 TABLET BY MOUTH TWICE DAILY AS NEEDED FOR ANXIETY   BOTOX IJ Inject 1 each as directed every 3 (three) months. Alternating with trigger point inj   gabapentin 800 MG tablet Commonly known as: NEURONTIN Take 800 mg by mouth 4 (four) times daily.   metroNIDAZOLE 0.75 % vaginal gel Commonly known as: METROGEL Use one applicator intravaginally after sexual intercourse   multivitamin with minerals Tabs tablet Take 1 tablet by mouth daily.   omeprazole 20 MG capsule Commonly known as:  PRILOSEC TAKE 1 CAPSULE BY MOUTH ONCE DAILY   Oxycodone HCl 10 MG Tabs Take by mouth.   polyethylene glycol 17 g packet Commonly known as: MIRALAX / GLYCOLAX Take 17 g by mouth every other day.   rizatriptan 10 MG tablet Commonly known as: MAXALT Take 1 tablet (10 mg total) by mouth as needed for migraine. May repeat in 2 hours if needed   valACYclovir 500 MG tablet Commonly known as: VALTREX Take 2 tablets (1,000 mg total) by mouth 2 (two) times daily as needed (cold sores).   Vitamin D (Ergocalciferol) 1.25 MG (50000 UNIT) Caps capsule Commonly known as: DRISDOL TAKE 1 CAPSULE (50,000 UNITS) BY MOUTH EVERY 7 DAYS        Allergies:  Allergies  Allergen Reactions   Compazine Other (See Comments)    Dystonia   Metoclopramide Other (See Comments)    dystonia    Family History: Family History  Problem Relation Age of Onset   Diabetes Mother    Diabetes Father    Heart failure Father    Hypertension Father    Pulmonary fibrosis Father        Secondary to amiodarone   Nephrolithiasis Father    Arrhythmia Father        Suspected ventricular tachycardia/fibrillation   Other Brother        Hypermobile joints   Kidney cancer Neg Hx    Bladder Cancer Neg Hx     Social History:  reports that she quit smoking about 6 years ago. Her smoking use included cigarettes. She started smoking about 21 years ago. She has a 7.50 pack-year smoking history. She has never used smokeless tobacco. She reports that she does not currently use alcohol. She reports that she does not use drugs.  ROS: Pertinent ROS in HPI  Physical Exam: BP 115/82   Pulse 99   Ht 5\' 7"  (1.702 m)   Wt 230 lb (104.3 kg)   BMI 36.02 kg/m   Constitutional:  Well nourished. Alert and oriented, No acute distress. HEENT: [place] AT, mask in place.  Trachea midline Cardiovascular: No clubbing, cyanosis, or edema. Respiratory: Normal respiratory effort, no increased work of breathing. Neurologic: Grossly intact,  no focal deficits, moving all 4 extremities. Psychiatric: Normal mood and affect.    Laboratory Data: Urinalysis Component     Latest Ref Rng & Units 11/22/2020  Specific Gravity, UA     1.005 - 1.030 1.010  pH, UA     5.0 - 7.5 6.0  Color, UA     Yellow Yellow  Appearance Ur     Clear Clear  Leukocytes,UA     Negative Negative  Protein,UA     Negative/Trace Negative  Glucose, UA     Negative Negative  Ketones, UA     Negative Negative  RBC, UA     Negative Trace (A)  Bilirubin, UA     Negative Negative  Urobilinogen, Ur     0.2 - 1.0 mg/dL 0.2  Nitrite, UA     Negative Negative  Microscopic Examination      See below:   Component     Latest Ref Rng & Units 11/22/2020  WBC, UA     0 - 5 /hpf 0-5  RBC     0 - 2 /hpf 0-2  Epithelial Cells (non renal)     0 - 10 /hpf 0-10  Bacteria, UA     None seen/Few None seen  I have reviewed the labs.   Pertinent Imaging: Narrative & Impression  CLINICAL DATA:  One year of microscopic hematuria. History of renal stones.   EXAM: CT ABDOMEN AND PELVIS WITHOUT AND WITH CONTRAST   TECHNIQUE: Multidetector CT imaging of the abdomen and pelvis was performed following the standard protocol before and following the bolus administration of intravenous contrast.   CONTRAST:  OMNIPAQUE IOHEXOL 350 MG/ML SOLN   COMPARISON:  CT November 10, 2019   FINDINGS: Lower chest: Scarring versus atelectasis in the anterior right lower lobe and lingula.   Hepatobiliary: Calcified hepatic granuloma. No suspicious hepatic lesion. Gallbladder surgically absent. No biliary ductal dilation.   Pancreas: Within normal limits.   Spleen: Within normal limits.   Adrenals/Urinary Tract: Bilateral adrenal glands are unremarkable.   No hydronephrosis. Small nonobstructive bilateral renal stones measuring up to 4 mm in the lower pole on the right and 3 mm in the lower pole on the left. Hypodense subcentimeter left renal lesion which  are too small to accurately characterize but statistically likely to represent renal cysts. No solid enhancing renal masses.   Symmetric enhancement and excretion of contrast from the bilateral kidneys. No suspicious filling defect visualized within the opacified portions of the collecting systems or ureters on delayed imaging.   Urinary bladder is grossly unremarkable without suspicious asymmetric wall thickening or intraluminal filling defects.   Stomach/Bowel: Stomach is grossly unremarkable for degree of distension. Normal positioning of the duodenum/ligament of [place]. No pathologic dilation of small bowel. Radiopaque enteric tablets in the large and small bowel. The appendix appears surgically absent. Terminal ileum is within normal limits. No suspicious colonic wall thickening or mass like lesions visualized.   Vascular/Lymphatic: No abdominal aortic aneurysm. No pathologically enlarged abdominal or pelvic lymph nodes.   Reproductive: Hysterectomy. No suspicious right adnexal mass. Simple appearing 4.1 cm left ovarian cyst.   Other: No abdominal wall hernia or abnormality. No abdominopelvic ascites.   Musculoskeletal: No acute or significant osseous findings.   IMPRESSION: 1. Bilateral nonobstructive nephrolithiasis. 2. No solid enhancing renal masses. 3. Simple appearing 4.1 cm left ovarian cyst. No follow-up imaging recommended. Note: This recommendation does not apply to premenarchal patients and to those with increased risk (genetic, family history, elevated tumor markers or other high-risk factors) of ovarian cancer. Reference: JACR 2020 Feb; 17(2):248-254     Electronically Signed   By: Maudry Mayhew MD   On: 09/21/2020 10:21  I have independently reviewed the films.  See HPI.   Assessment & Plan:    1. Nephrolithiasis -KUB with bilateral nephrolithiasis  2. High risk hematuria -CT Urogram with bilateral nephrolithiasis 08/2020 -cysto NED 10/2019 -no  reports of gross heme -UA negative for micro heme  3. Left ovarian cyst -refer to gynecology   Return in about 1 year (around 11/22/2021) for KUB and UA .  These notes generated with voice recognition software. I apologize for typographical errors.  Michiel Cowboy, PA-C    [facility] [address] [place], [place] [address] [phone]

## 2020-11-22 ENCOUNTER — Ambulatory Visit
Admission: RE | Admit: 2020-11-22 | Discharge: 2020-11-22 | Disposition: A | Discharge status: Home / Self Care | Payer: BC Managed Care – PPO | Source: Ambulatory Visit | Attending: Urology | Admitting: Urology

## 2020-11-22 ENCOUNTER — Ambulatory Visit (INDEPENDENT_AMBULATORY_CARE_PROVIDER_SITE_OTHER): Payer: BC Managed Care – PPO | Admitting: Urology

## 2020-11-22 ENCOUNTER — Other Ambulatory Visit: Payer: Self-pay

## 2020-11-22 ENCOUNTER — Encounter: Payer: Self-pay | Admitting: Urology

## 2020-11-22 VITALS — BP 115/82 | HR 99 | Ht 67.0 in | Wt 230.0 lb

## 2020-11-22 DIAGNOSIS — N200 Calculus of kidney: Secondary | ICD-10-CM

## 2020-11-22 DIAGNOSIS — N83209 Unspecified ovarian cyst, unspecified side: Secondary | ICD-10-CM | POA: Diagnosis not present

## 2020-11-22 DIAGNOSIS — Z9049 Acquired absence of other specified parts of digestive tract: Secondary | ICD-10-CM | POA: Diagnosis not present

## 2020-11-23 LAB — URINALYSIS, COMPLETE
Bilirubin, UA: NEGATIVE
Glucose, UA: NEGATIVE
Ketones, UA: NEGATIVE
Leukocytes,UA: NEGATIVE
Nitrite, UA: NEGATIVE
Protein,UA: NEGATIVE
Specific Gravity, UA: 1.01 (ref 1.005–1.030)
Urobilinogen, Ur: 0.2 mg/dL (ref 0.2–1.0)
pH, UA: 6 (ref 5.0–7.5)

## 2020-11-23 LAB — MICROSCOPIC EXAMINATION: Bacteria, UA: NONE SEEN

## 2020-11-29 DIAGNOSIS — Z79899 Other long term (current) drug therapy: Secondary | ICD-10-CM | POA: Diagnosis not present

## 2020-11-29 DIAGNOSIS — M5416 Radiculopathy, lumbar region: Secondary | ICD-10-CM | POA: Diagnosis not present

## 2020-11-29 DIAGNOSIS — M47817 Spondylosis without myelopathy or radiculopathy, lumbosacral region: Secondary | ICD-10-CM | POA: Diagnosis not present

## 2020-11-29 DIAGNOSIS — F418 Other specified anxiety disorders: Secondary | ICD-10-CM | POA: Diagnosis not present

## 2020-12-04 ENCOUNTER — Telehealth: Payer: Self-pay

## 2020-12-04 NOTE — Telephone Encounter (Signed)
Copied from CRM [id number]. Topic: Appointment Scheduling - Scheduling Inquiry for Clinic >> Dec 04, 2020  2:45 PM Pawlus, Maxine Glenn A wrote: Reason for CRM: Pt wanted to schedule a Physical with Merita Norton, please call back.

## 2020-12-05 ENCOUNTER — Other Ambulatory Visit: Payer: Self-pay | Admitting: Family Medicine

## 2020-12-05 DIAGNOSIS — F5101 Primary insomnia: Secondary | ICD-10-CM

## 2020-12-05 DIAGNOSIS — F411 Generalized anxiety disorder: Secondary | ICD-10-CM

## 2020-12-21 ENCOUNTER — Other Ambulatory Visit: Payer: Self-pay

## 2020-12-21 ENCOUNTER — Ambulatory Visit (INDEPENDENT_AMBULATORY_CARE_PROVIDER_SITE_OTHER): Payer: BC Managed Care – PPO | Admitting: Physician Assistant

## 2020-12-21 ENCOUNTER — Encounter: Payer: Self-pay | Admitting: Physician Assistant

## 2020-12-21 VITALS — BP 98/71 | HR 88 | Temp 97.9°F | Resp 16 | Ht 67.0 in | Wt 241.5 lb

## 2020-12-21 DIAGNOSIS — G43009 Migraine without aura, not intractable, without status migrainosus: Secondary | ICD-10-CM | POA: Diagnosis not present

## 2020-12-21 DIAGNOSIS — Z6837 Body mass index (BMI) 37.0-37.9, adult: Secondary | ICD-10-CM

## 2020-12-21 DIAGNOSIS — Z0000 Encounter for general adult medical examination without abnormal findings: Secondary | ICD-10-CM

## 2020-12-21 DIAGNOSIS — Z1231 Encounter for screening mammogram for malignant neoplasm of breast: Secondary | ICD-10-CM

## 2020-12-21 DIAGNOSIS — K640 First degree hemorrhoids: Secondary | ICD-10-CM

## 2020-12-21 DIAGNOSIS — E559 Vitamin D deficiency, unspecified: Secondary | ICD-10-CM

## 2020-12-21 DIAGNOSIS — E782 Mixed hyperlipidemia: Secondary | ICD-10-CM | POA: Diagnosis not present

## 2020-12-21 DIAGNOSIS — F411 Generalized anxiety disorder: Secondary | ICD-10-CM

## 2020-12-21 MED ORDER — AIMOVIG 70 MG/ML ~~LOC~~ SOAJ: 1.1200 mL | SUBCUTANEOUS | 6 refills | Status: DC

## 2020-12-21 NOTE — Assessment & Plan Note (Addendum)
Continue with OTC management including preparation H, witch hazel, and Miralax.  Will check CBC for anemia.

## 2020-12-21 NOTE — Assessment & Plan Note (Signed)
Pt feels much improved, takes only 1 xanax nightly, down from 2-3. Discussed need to reevalate use of medication if she feels she needs to take more.

## 2020-12-21 NOTE — Assessment & Plan Note (Addendum)
Used to follow with neurology but became too costly. We discussed preventative options, pt tried high dose topamax, made her feel tired and caused memory issues, lower doses did help with less side effects. Tried Amitriptyline and caused too much fatigue. Unable to take propranolol d/t low BP. 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  Continue with Maxalt, no more than twice weekly, for abortive purposes.  If pt is able to pick up medication, f/u 3 mo.  If unable, we will try Topamax at a low dose, 25 mg.

## 2020-12-21 NOTE — Progress Notes (Signed)
Complete physical exam   Patient: [name]   DOB: 1980-06-16   40 y.o. Female  MRN: [id number] Visit Date: 12/21/2020  Today's healthcare provider: Alfredia Ferguson, PA-C   Chief Complaint  Patient presents with   Annual Exam   Subjective     HPI  [name] is a 40 y.o. female who presents today for a complete physical exam. She reports consuming a general diet. The patient does not participate in regular exercise at present. She generally feels fairly well. She reports sleeping fairly well. She does have additional problems to discuss today, patient reports concerns of bleeding hemorrhoids for the past year, patient reports history of constipation and states that she takes Miralax daily as well as topically using preparation H. 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 Reports chronic migraines range from 2-3 days a month or 15 days a month, she uses Maxalt. Has tried several preventative medications in the past: Topamax, amitriptyline, Botox, trigger injections, without success.  Takes Xanax once a day for anxiety, down from 2-3 times a day.  Past Medical History:  Diagnosis Date   Acute pyelonephritis 06/29/2012   Anxiety    Arthritis    Asthma    AS A CHILD   Complication of anesthesia    TROUBLE URINATING IN THE PAST   Gallstones    GERD (gastroesophageal reflux disease)    History of kidney stones    Kidney stones    bilateral   Migraine    Pelvic pain in female    PONV (postoperative nausea and vomiting)    WITH HYSTERECTOMY ONLY   Pyelonephritis 2015   Renal mass 2017   right. considered a normal cyst on kidney.   Tobacco abuse    Past Surgical History:  Procedure Laterality Date   ABDOMINAL HYSTERECTOMY  2006   ANKLE ARTHROSCOPY Left 2017   ANKLE ARTHROSCOPY Left 11/24/2015   APPENDECTOMY  07/26/13   CAST APPLICATION  12/25/2011   Procedure: CAST APPLICATION;  Surgeon: Javier Docker, MD;  Location: [facility]  ORS;  Service: Orthopedics;  Laterality: Left;   CHOLECYSTECTOMY  03/11/2011   Procedure: LAPAROSCOPIC CHOLECYSTECTOMY WITH INTRAOPERATIVE CHOLANGIOGRAM;  Surgeon: Atilano Ina, MD;  Location: [facility] OR;  Service: General;  Laterality: N/A;   CHOLECYSTECTOMY, LAPAROSCOPIC     CYSTOSCOPY/URETEROSCOPY/HOLMIUM LASER/STENT PLACEMENT Left 06/18/2016   Procedure: CYSTOSCOPY/URETEROSCOPY/HOLMIUM LASER/STENT PLACEMENT;  Surgeon: Vanna Scotland, MD;  Location: [facility] ORS;  Service: Urology;  Laterality: Left;   LAPAROSCOPIC OVARIAN CYSTECTOMY Right 09/02/2017   Procedure: LAPAROSCOPIC OVARIAN CYSTECTOMY;  Surgeon: Nadara Mustard, MD;  Location: [facility] ORS;  Service: Gynecology;  Laterality: Right;   LITHOTRIPSY     x 4   ORIF ANKLE FRACTURE  12/25/2011   Procedure: OPEN REDUCTION INTERNAL FIXATION (ORIF) ANKLE FRACTURE;  Surgeon: Javier Docker, MD;  Location: [facility] ORS;  Service: Orthopedics;  Laterality: Left;   TONSILLECTOMY     Social History   Socioeconomic History   Marital status: Married    Spouse name: [name]   Number of children: Not on file   Years of education: Not on file   Highest education level: Not on file  Occupational History   Not on file  Tobacco Use   Smoking status: Former    Packs/day: 0.50    Years: 15.00    Pack years: 7.50    Types: Cigarettes    Start date: 11/03/1999    Quit date: 09/26/2014    Years  since quitting: 6.2   Smokeless tobacco: Never  Vaping Use   Vaping Use: Never used  Substance and Sexual Activity   Alcohol use: Not Currently    Alcohol/week: 0.0 standard drinks   Drug use: No   Sexual activity: Yes    Birth control/protection: Surgical    Comment: Hysterectomy   Other Topics Concern   Not on file  Social History Narrative   Not on file   Social Determinants of Health   Financial Resource Strain: Not on file  Food Insecurity: Not on file  Transportation Needs: Not on file  Physical Activity: Not on file  Stress: Not on file  Social  Connections: Not on file  Intimate Partner Violence: Not on file   Family Status  Relation Name Status   Mother  Deceased   Father  Deceased   Sister  Alive   Brother  Alive   Brother  Alive   Brother  Alive   Neg Hx  (Not Specified)   Family History  Problem Relation Age of Onset   Diabetes Mother    Diabetes Father    Heart failure Father    Hypertension Father    Pulmonary fibrosis Father        Secondary to amiodarone   Nephrolithiasis Father    Arrhythmia Father        Suspected ventricular tachycardia/fibrillation   Other Brother        Hypermobile joints   Kidney cancer Neg Hx    Bladder Cancer Neg Hx    Allergies  Allergen Reactions   Compazine Other (See Comments)    Dystonia   Metoclopramide Other (See Comments)    dystonia    Patient Care Team: [name] as PCP - General (Family Medicine) Alba Cory, MD as Attending Physician (Family Medicine) Earline Mayotte, MD (General Surgery)   Medications: Outpatient Medications Prior to Visit  Medication Sig   ALPRAZolam (XANAX) 1 MG tablet TAKE 1/2 TO 1 TABLET BY MOUTH TWICE DAILY AS NEEDED FOR ANXIETY   gabapentin (NEURONTIN) 800 MG tablet Take 800 mg by mouth 4 (four) times daily.   methylphenidate (RITALIN) 5 MG tablet Take 5 mg by mouth daily.   Multiple Vitamin (MULTIVITAMIN WITH MINERALS) TABS tablet Take 1 tablet by mouth daily.   omeprazole (PRILOSEC) 20 MG capsule TAKE 1 CAPSULE BY MOUTH ONCE DAILY   Oxycodone HCl 10 MG TABS Take by mouth.   polyethylene glycol (MIRALAX / GLYCOLAX) packet Take 17 g by mouth every other day.    rizatriptan (MAXALT) 10 MG tablet Take 1 tablet (10 mg total) by mouth as needed for migraine. May repeat in 2 hours if needed   Vitamin D, Ergocalciferol, (DRISDOL) 1.25 MG (50000 UNIT) CAPS capsule TAKE 1 CAPSULE (50,000 UNITS) BY MOUTH EVERY 7 DAYS   [DISCONTINUED] OnabotulinumtoxinA (BOTOX IJ) Inject 1 each as directed every 3 (three) months.  Alternating with trigger point inj   valACYclovir (VALTREX) 500 MG tablet Take 2 tablets (1,000 mg total) by mouth 2 (two) times daily as needed (cold sores). (Patient not taking: Reported on 12/21/2020)   [DISCONTINUED] metroNIDAZOLE (METROGEL) 0.75 % vaginal gel Use one applicator intravaginally after sexual intercourse (Patient not taking: Reported on 12/21/2020)   No facility-administered medications prior to visit.    Review of Systems  Constitutional: Negative.  Negative for fatigue, fever and unexpected weight change.  Eyes: Negative.   Respiratory:  Negative for shortness of breath.   Cardiovascular: Negative.  Negative for  chest pain.  Gastrointestinal:  Positive for anal bleeding, blood in stool and rectal pain.  Endocrine: Negative.   Genitourinary:  Positive for pelvic pain and vaginal pain. Negative for dysuria and menstrual problem.  Musculoskeletal: Negative.   Skin: Negative.   Allergic/Immunologic: Negative.   Neurological: Negative.  Negative for dizziness.  Hematological: Negative.   Psychiatric/Behavioral: Negative.      Objective    BP 98/71   Pulse 88   Temp 97.9 F (36.6 C) (Temporal)   Resp 16   Ht 5\' 7"  (1.702 m)   Wt 241 lb 8 oz (109.5 kg)   SpO2 98%   BMI 37.82 kg/m    Physical Exam Constitutional:      General: She is awake.     Appearance: She is well-developed.  HENT:     Head: Normocephalic.     Right Ear: Tympanic membrane normal.     Left Ear: Tympanic membrane normal.  Eyes:     Conjunctiva/sclera: Conjunctivae normal.     Pupils: Pupils are equal, round, and reactive to light.  Neck:     Thyroid: No thyroid mass or thyromegaly.  Cardiovascular:     Rate and Rhythm: Normal rate and regular rhythm.     Heart sounds: Normal heart sounds.  Pulmonary:     Effort: Pulmonary effort is normal.     Breath sounds: Normal breath sounds.  Chest:     Comments: Bilateral breasts without masses, skin changes, nipple discharge, nipple  inversion or ulceration. Abdominal:     Palpations: Abdomen is soft.     Tenderness: There is no abdominal tenderness.  Genitourinary:    Comments: Small non thrombosed hemorrhoids, not actively bleeding. Musculoskeletal:     Right lower leg: No swelling.     Left lower leg: No swelling.  Lymphadenopathy:     Cervical: No cervical adenopathy.  Skin:    General: Skin is warm.  Neurological:     Mental Status: She is alert and oriented to person, place, and time.  Psychiatric:        Attention and Perception: Attention normal.        Mood and Affect: Mood normal.        Speech: Speech normal.        Behavior: Behavior is cooperative.     Last depression screening scores PHQ 2/9 Scores 05/08/2020 12/13/2019 11/25/2018  PHQ - 2 Score 2 1 0  PHQ- 9 Score 6 5 -   Last fall risk screening Fall Risk  05/08/2020  Falls in the past year? 0  Number falls in past yr: 0  Injury with Fall? 0  Risk for fall due to : No Fall Risks  Follow up Falls evaluation completed   Last Audit-C alcohol use screening Alcohol Use Disorder Test (AUDIT) 05/08/2020  1. How often do you have a drink containing alcohol? 0  2. How many drinks containing alcohol do you have on a typical day when you are drinking? 0  3. How often do you have six or more drinks on one occasion? 0  AUDIT-C Score 0  Alcohol Brief Interventions/Follow-up AUDIT Score <7 follow-up not indicated   A score of 3 or more in women, and 4 or more in men indicates increased risk for alcohol abuse, EXCEPT if all of the points are from question 1   No results found for any visits on 12/21/20.  Assessment & Plan    Routine Health Maintenance and Physical Exam   Immunization History  Administered Date(s) Administered   Influenza-Unspecified 01/03/2015, 10/14/2017   Tdap 03/22/2011    Health Maintenance  Topic Date Due   COVID-19 Vaccine (1) Never done   Pneumococcal Vaccine 34-84 Years old (1 - PCV) Never done   Hepatitis C  Screening  Never done   INFLUENZA VACCINE  05/25/2021 (Originally 09/25/2020)   TETANUS/TDAP  03/21/2021   HIV Screening  Completed   HPV VACCINES  Aged Out   PAP SMEAR-Modifier  Discontinued    Discussed health benefits of physical activity, and encouraged her to engage in regular exercise appropriate for her age and condition.  Problem List Items Addressed This Visit       Cardiovascular and Mediastinum   Migraine without aura or status migrainosus    Used to follow with neurology but became too costly. We discussed preventative options, pt tried high dose topamax, made her feel tired and caused memory issues, lower doses did help with less side effects. Tried Amitriptyline and caused too much fatigue. Unable to take propranolol d/t low BP. 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  Continue with Maxalt, no more than twice weekly, for abortive purposes.  If pt is able to pick up medication, f/u 3 mo.  If unable, we will try Topamax at a low dose, 25 mg.      Relevant Medications   Erenumab-aooe (AIMOVIG) 70 MG/ML SOAJ   Grade I hemorrhoids    Continue with OTC management including preparation H, witch hazel, and Miralax.  Will check CBC for anemia.       Relevant Orders   CBC w/Diff/Platelet     Other   Generalized anxiety disorder    Pt feels much improved, takes only 1 xanax nightly, down from 2-3. Discussed need to reevalate use of medication if she feels she needs to take more.       Hyperlipidemia   Relevant Orders   Lipid Profile   Other Visit Diagnoses     Encounter for annual physical exam    -  Primary   Relevant Orders   Lipid Profile   Comprehensive Metabolic Panel (CMET)   Encounter for screening mammogram for malignant neoplasm of breast       Relevant Orders   MS DIGITAL SCREENING TOMO BILATERAL   Vitamin D deficiency       Relevant Orders   Vitamin D (25  hydroxy)   BMI 37.0-37.9, adult       Relevant Orders   Lipid Profile   Comprehensive Metabolic Panel (CMET)        Return in about 3 months (around 03/23/2021) for migraines.     I, Alfredia Ferguson, PA-C have reviewed all documentation for this visit. The documentation on  12/21/2020 or the exam, diagnosis, procedures, and orders are all accurate and complete.    Alfredia Ferguson, PA-C  [facility] [phone] (phone) [phone] (fax)  [facility]

## 2020-12-22 ENCOUNTER — Ambulatory Visit: Payer: BC Managed Care – PPO | Admitting: Obstetrics & Gynecology

## 2020-12-28 ENCOUNTER — Encounter: Payer: Self-pay | Admitting: Physician Assistant

## 2020-12-28 DIAGNOSIS — M5136 Other intervertebral disc degeneration, lumbar region: Secondary | ICD-10-CM | POA: Diagnosis not present

## 2021-01-08 ENCOUNTER — Ambulatory Visit: Payer: BC Managed Care – PPO | Admitting: Obstetrics & Gynecology

## 2021-01-26 DIAGNOSIS — M5416 Radiculopathy, lumbar region: Secondary | ICD-10-CM | POA: Diagnosis not present

## 2021-01-26 DIAGNOSIS — F418 Other specified anxiety disorders: Secondary | ICD-10-CM | POA: Diagnosis not present

## 2021-01-26 DIAGNOSIS — M47817 Spondylosis without myelopathy or radiculopathy, lumbosacral region: Secondary | ICD-10-CM | POA: Diagnosis not present

## 2021-01-26 DIAGNOSIS — Z79899 Other long term (current) drug therapy: Secondary | ICD-10-CM | POA: Diagnosis not present

## 2021-01-30 ENCOUNTER — Encounter: Payer: Self-pay | Admitting: Urology

## 2021-02-13 ENCOUNTER — Encounter: Payer: Self-pay | Admitting: Obstetrics & Gynecology

## 2021-02-13 ENCOUNTER — Other Ambulatory Visit: Payer: Self-pay

## 2021-02-13 ENCOUNTER — Ambulatory Visit: Payer: BC Managed Care – PPO | Admitting: Obstetrics & Gynecology

## 2021-02-13 VITALS — BP 120/80 | Ht 67.0 in | Wt 236.0 lb

## 2021-02-13 DIAGNOSIS — N83202 Unspecified ovarian cyst, left side: Secondary | ICD-10-CM

## 2021-02-13 DIAGNOSIS — R1032 Left lower quadrant pain: Secondary | ICD-10-CM

## 2021-02-13 NOTE — Patient Instructions (Signed)
Transvaginal Ultrasound A transvaginal ultrasound, also called an endovaginal ultrasound, is a test that uses sound waves to take pictures of the female genital tract. The pictures are taken with a device, called a transducer, that is placed in the vagina. This test may be done to: Check for problems with your pregnancy. Check your developing baby. Check for anything abnormal in the uterus or ovaries. Find out why you have pelvic pain or bleeding. Tell a health care provider about: Any allergies you have. All medicines you are taking, including vitamins, herbs, eye drops, creams, and over-the-counter medicines. Any bleeding problems you have. Any surgeries you have had. Any medical conditions you have. Whether you are pregnant or may be pregnant. Whether you are having your menstrual period. What are the risks? This is a safe procedure. There are no known risks or complications of having this test. What happens before the procedure? This procedure needs to be done when your bladder is empty. Follow your health care provider's instructions about drinking fluids and emptying your bladder before the test. What happens during the procedure?  You will empty your bladder before the procedure. You will undress from the waist down. You will put on a gown or a wrap to cover yourself while you get ready for the exam. You will lie on your back on an exam table. You will place your feet into foot rests (stirrups). A drape will be placed over your abdomen and your legs. A health care provider will cover the transducer with a germ-free (sterile) cover. A gel will be put on the transducer. The gel helps transmit the sound waves and prevents irritation of your vagina. The health care provider will insert the transducer into your vagina to get images. These will be displayed on a monitor that looks like a small television screen. The transducer will be removed when the procedure is complete. The procedure  may vary among health care providers and hospitals. What can I expect after the procedure? It is up to you to get the results of your procedure. Ask your health care provider, or the department that is doing the procedure, when your results will be ready. Keep all follow-up visits. This is important. Summary A transvaginal ultrasound, also called an endovaginal ultrasound, is a test that uses sound waves to take pictures of the female genital tract. This is a safe procedure. There are no known risks associated with this test. The procedure needs to be done when your bladder is empty. Follow your health care provider's instructions about drinking fluids and emptying your bladder before the test. During the procedure, you will undress from the waist down and lie down on an exam table. A health care provider will insert a transducer into your vagina to obtain images. Ask your health care provider, or the department that is doing the procedure, when your results will be ready. This information is not intended to replace advice given to you by your health care provider. Make sure you discuss any questions you have with your health care provider. Document Revised: 10/25/2020 Document Reviewed: 05/12/2020 Elsevier Patient Education  2022 [employer].

## 2021-02-13 NOTE — Progress Notes (Signed)
HPI: Patient is a 40 y.o. [id number] who LMP was No LMP recorded. Patient has had a hysterectomy., presents today for a problem visit.  She complains of recent findings of Left ovarion cyst by CT - Pelvis several months ago.  Pt has had symptoms of  mild LLQ pain, comes and goes.  SHe also has IBS and joing pains, and so often is unclear as to the cause of her pain. . She has had prior RSO for cyst 2020, and also has had prior hysterectomy many years ago.  PMHx: She  has a past medical history of Acute pyelonephritis (06/29/2012), Anxiety, Arthritis, Asthma, Complication of anesthesia, Ehlers-Danlos syndrome, Gallstones, GERD (gastroesophageal reflux disease), History of kidney stones, Kidney stones, Migraine, Pelvic pain in female, PONV (postoperative nausea and vomiting), Pyelonephritis (2015), Renal mass (2017), and Tobacco abuse. Also,  has a past surgical history that includes Abdominal hysterectomy (2006); Tonsillectomy; Lithotripsy; Cholecystectomy (03/11/2011); Cholecystectomy, laparoscopic; ORIF ankle fracture (12/25/2011); Cast application (12/25/2011); Appendectomy (07/26/13); Ankle arthroscopy (Left, 2017); Ankle arthroscopy (Left, 11/24/2015); Cystoscopy/ureteroscopy/holmium laser/stent placement (Left, 06/18/2016); and Laparoscopic ovarian cystectomy (Right, 09/02/2017)., family history includes Arrhythmia in her father; Diabetes in her father and mother; Heart failure in her father; Hypertension in her father; Nephrolithiasis in her father; Other in her brother; Pulmonary fibrosis in her father.,  reports that she quit smoking about 6 years ago. Her smoking use included cigarettes. She started smoking about 21 years ago. She has a 7.50 pack-year smoking history. She has never used smokeless tobacco. She reports that she does not currently use alcohol. She reports that she does not use drugs.  She has a current medication list which includes the following prescription(s): alprazolam, gabapentin,  methylphenidate, multivitamin with minerals, omeprazole, oxycodone hcl, polyethylene glycol, rizatriptan, valacyclovir, vitamin d (ergocalciferol), and aimovig. Also, is allergic to compazine and metoclopramide.  Review of Systems  Constitutional:  Positive for malaise/fatigue. Negative for chills and fever.  HENT:  Negative for congestion, sinus pain and sore throat.   Eyes:  Negative for blurred vision and pain.  Respiratory:  Negative for cough and wheezing.   Cardiovascular:  Negative for chest pain and leg swelling.  Gastrointestinal:  Positive for abdominal pain and constipation. Negative for diarrhea, heartburn, nausea and vomiting.  Genitourinary:  Positive for frequency. Negative for dysuria, hematuria and urgency.  Musculoskeletal:  Positive for joint pain. Negative for back pain, myalgias and neck pain.  Skin:  Negative for itching and rash.  Neurological:  Positive for tingling, weakness and headaches. Negative for dizziness and tremors.  Endo/Heme/Allergies:  Bruises/bleeds easily.  Psychiatric/Behavioral:  Negative for depression. The patient is not nervous/anxious and does not have insomnia.    Objective: BP 120/80    Ht 5\' 7"  (1.702 m)    Wt 236 lb (107 kg)    BMI 36.96 kg/m  Physical Exam Constitutional:      General: She is not in acute distress.    Appearance: She is well-developed.  Genitourinary:     Bladder and urethral meatus normal.     Genitourinary Comments: Cuff intact/ no lesions Absent uterus and cervix No mass in L adnexa area palpated     Vaginal cuff intact.    No vaginal erythema or bleeding.      Right Adnexa: not tender and no mass present.    Left Adnexa: not tender and no mass present.    Cervix is absent.     Uterus is absent.     Pelvic exam was performed with patient in  the lithotomy position.  HENT:     Head: Normocephalic and atraumatic.     Nose: Nose normal.  Abdominal:     General: There is no distension.     Palpations: Abdomen is  soft.     Tenderness: There is no abdominal tenderness.  Musculoskeletal:        General: Normal range of motion.  Neurological:     Mental Status: She is alert and oriented to person, place, and time.     Cranial Nerves: No cranial nerve deficit.  Skin:    General: Skin is warm and dry.  Psychiatric:        Attention and Perception: Attention normal.        Mood and Affect: Mood normal.        Speech: Speech normal.        Behavior: Behavior normal.        Cognition and Memory: Cognition normal.        Judgment: Judgment normal.    ASSESSMENT/PLAN:    Problem List Items Addressed This Visit   None Visit Diagnoses     LLQ pain    -  Primary   Relevant Orders   US PELVIC COMPLETE WITH TRANSVAGINAL   Left ovarian cyst       Relevant Orders   US PELVIC COMPLETE WITH TRANSVAGINAL     Plan [place] and follow up Options based on cyst size and characteristics discussed    Cystectomy if >6 cm or if concerning features (to avoid torsion, pain, or test for diease process or cancer)  A total of 30 minutes were spent face-to-face with the patient as well as preparation, review, communication, and documentation during this encounter.    Barnett Applebaum, MD, [facility], [facility] 02/13/2021  10:48 AM

## 2021-02-21 ENCOUNTER — Other Ambulatory Visit: Payer: Self-pay | Admitting: Family Medicine

## 2021-02-21 DIAGNOSIS — F5101 Primary insomnia: Secondary | ICD-10-CM

## 2021-02-21 DIAGNOSIS — F411 Generalized anxiety disorder: Secondary | ICD-10-CM

## 2021-02-22 MED ORDER — ALPRAZOLAM 1 MG PO TABS: 0.5000 mg | ORAL_TABLET | Freq: Two times a day (BID) | ORAL | 0 refills | Status: DC | PRN

## 2021-03-28 DIAGNOSIS — M47817 Spondylosis without myelopathy or radiculopathy, lumbosacral region: Secondary | ICD-10-CM | POA: Diagnosis not present

## 2021-03-28 DIAGNOSIS — M5416 Radiculopathy, lumbar region: Secondary | ICD-10-CM | POA: Diagnosis not present

## 2021-03-28 DIAGNOSIS — Z79899 Other long term (current) drug therapy: Secondary | ICD-10-CM | POA: Diagnosis not present

## 2021-03-28 DIAGNOSIS — F418 Other specified anxiety disorders: Secondary | ICD-10-CM | POA: Diagnosis not present

## 2021-03-29 ENCOUNTER — Ambulatory Visit: Payer: BC Managed Care – PPO | Admitting: Physician Assistant

## 2021-04-06 ENCOUNTER — Telehealth (INDEPENDENT_AMBULATORY_CARE_PROVIDER_SITE_OTHER): Payer: BC Managed Care – PPO | Admitting: Family Medicine

## 2021-04-06 ENCOUNTER — Other Ambulatory Visit: Payer: Self-pay

## 2021-04-06 DIAGNOSIS — J329 Chronic sinusitis, unspecified: Secondary | ICD-10-CM

## 2021-04-06 MED ORDER — AZITHROMYCIN 250 MG PO TABS: ORAL_TABLET | ORAL | 0 refills | Status: AC

## 2021-04-06 NOTE — Progress Notes (Signed)
MyChart Video Visit    Virtual Visit via Video Note   This visit type was conducted due to national recommendations for restrictions regarding the COVID-19 Pandemic (e.g. social distancing) in an effort to limit this patient's exposure and mitigate transmission in our community. This patient is at least at moderate risk for complications without adequate follow up. This format is felt to be most appropriate for this patient at this time. Physical exam was limited by quality of the video and audio technology used for the visit.   Patient location: home Provider location: [facility]  I discussed the limitations of evaluation and management by telemedicine and the availability of in person appointments. The patient expressed understanding and agreed to proceed.  Patient: [name]   DOB: 07-17-1980   41 y.o. Female  MRN: [id number] Visit Date: 04/06/2021  Today's healthcare provider: Mila Merry, MD   Chief Complaint  Patient presents with   Cough   Subjective    Cough This is a new problem. Episode onset: 2 weeks ago. The cough is Productive of sputum. Associated symptoms include chills, ear congestion, a fever (100.8), hemoptysis, postnasal drip, shortness of breath (at night) and wheezing (at night). Pertinent negatives include no rhinorrhea. Treatments tried: NyQuil, DayQuil, and Mucinx. The treatment provided no relief.   Patient states she had a COVID infection 1 month ago that progressed into pneumonia. Patient recovered from that infection, but then started to have a cough a congestion 2 weeks ago. Had a couple of negative covid test since last week.    Medications: Outpatient Medications Prior to Visit  Medication Sig   ALPRAZolam (XANAX) 1 MG tablet Take 0.5-1 tablets (0.5-1 mg total) by mouth 2 (two) times daily as needed. for anxiety   gabapentin (NEURONTIN) 800 MG tablet Take 800 mg by mouth 4 (four) times daily.   methylphenidate (RITALIN) 5 MG tablet Take 5 mg by  mouth daily.   Multiple Vitamin (MULTIVITAMIN WITH MINERALS) TABS tablet Take 1 tablet by mouth daily.   omeprazole (PRILOSEC) 20 MG capsule TAKE 1 CAPSULE BY MOUTH ONCE DAILY   Oxycodone HCl 10 MG TABS Take by mouth.   polyethylene glycol (MIRALAX / GLYCOLAX) packet Take 17 g by mouth every other day.    rizatriptan (MAXALT) 10 MG tablet Take 1 tablet (10 mg total) by mouth as needed for migraine. May repeat in 2 hours if needed   valACYclovir (VALTREX) 500 MG tablet Take 2 tablets (1,000 mg total) by mouth 2 (two) times daily as needed (cold sores).   Vitamin D, Ergocalciferol, (DRISDOL) 1.25 MG (50000 UNIT) CAPS capsule TAKE 1 CAPSULE (50,000 UNITS) BY MOUTH EVERY 7 DAYS   Erenumab-aooe (AIMOVIG) 70 MG/ML SOAJ Inject 1.12 mLs into the skin every 30 (thirty) days. (Patient not taking: Reported on 02/13/2021)   No facility-administered medications prior to visit.    Review of Systems  Constitutional:  Positive for chills and fever (100.8).  HENT:  Positive for postnasal drip, sinus pressure and sinus pain. Negative for rhinorrhea.   Respiratory:  Positive for cough, hemoptysis, shortness of breath (at night) and wheezing (at night).      Objective    There were no vitals taken for this visit.   Physical Exam   Awake, alert, oriented x 3. In no apparent distress   Assessment & Plan     1. Sinusitis, unspecified chronicity, unspecified location  - azithromycin (ZITHROMAX) 250 MG tablet; Take 2 tablets on day 1, then 1 tablet daily  on days 2 through 5  Dispense: 6 tablet; Refill: 0  Recommend  OTC saline spray or irrigation.  Call if symptoms change or if not rapidly improving.         I discussed the assessment and treatment plan with the patient. The patient was provided an opportunity to ask questions and all were answered. The patient agreed with the plan and demonstrated an understanding of the instructions.   The patient was advised to call back or seek an in-person  evaluation if the symptoms worsen or if the condition fails to improve as anticipated.  I provided 9 minutes of non-face-to-face time during this encounter.  The entirety of the information documented in the History of Present Illness, Review of Systems and Physical Exam were personally obtained by me. Portions of this information were initially documented by the CMA and reviewed by me for thoroughness and accuracy.    Mila Merry, MD [facility] [phone] (phone) [phone] (fax)  [facility]

## 2021-04-12 ENCOUNTER — Other Ambulatory Visit: Payer: Self-pay | Admitting: Obstetrics & Gynecology

## 2021-04-12 DIAGNOSIS — R1032 Left lower quadrant pain: Secondary | ICD-10-CM

## 2021-04-12 DIAGNOSIS — N83202 Unspecified ovarian cyst, left side: Secondary | ICD-10-CM

## 2021-05-14 ENCOUNTER — Other Ambulatory Visit: Payer: Self-pay | Admitting: Physician Assistant

## 2021-05-14 DIAGNOSIS — G43009 Migraine without aura, not intractable, without status migrainosus: Secondary | ICD-10-CM

## 2021-06-13 DIAGNOSIS — Z79899 Other long term (current) drug therapy: Secondary | ICD-10-CM | POA: Diagnosis not present

## 2021-06-13 DIAGNOSIS — F418 Other specified anxiety disorders: Secondary | ICD-10-CM | POA: Diagnosis not present

## 2021-06-13 DIAGNOSIS — M5416 Radiculopathy, lumbar region: Secondary | ICD-10-CM | POA: Diagnosis not present

## 2021-06-13 DIAGNOSIS — M47817 Spondylosis without myelopathy or radiculopathy, lumbosacral region: Secondary | ICD-10-CM | POA: Diagnosis not present

## 2021-07-27 ENCOUNTER — Telehealth: Payer: Self-pay | Admitting: Physician Assistant

## 2021-07-27 ENCOUNTER — Other Ambulatory Visit: Payer: Self-pay | Admitting: Family Medicine

## 2021-07-27 DIAGNOSIS — F5101 Primary insomnia: Secondary | ICD-10-CM

## 2021-07-27 DIAGNOSIS — F411 Generalized anxiety disorder: Secondary | ICD-10-CM

## 2021-07-27 NOTE — Telephone Encounter (Signed)
Requested medication (s) are due for refill today- yes  Requested medication (s) are on the active medication list -yes  Future visit scheduled -no  Last refill: 02/12/21 #60  Notes to clinic: non delegated Rx  Requested Prescriptions  Pending Prescriptions Disp Refills   ALPRAZolam (XANAX) 1 MG tablet [Pharmacy Med Name: ALPRAZOLAM 1 MG TAB] 60 tablet     Sig: TAKE 1/2 TO 1 TABLET BY MOUTH 2 TIMES DAILY AS NEEDED FOR ANXIETY.     Not Delegated - Psychiatry: Anxiolytics/Hypnotics 2 Failed - 07/27/2021  3:01 PM      Failed - This refill cannot be delegated      Failed - Urine Drug Screen completed in last 360 days      Passed - Patient is not pregnant      Passed - Valid encounter within last 6 months    Recent Outpatient Visits           3 months ago Sinusitis, unspecified chronicity, unspecified location   [facility] Malva Limes, MD   7 months ago Encounter for annual physical exam   [facility] Edwards, [place], PA-C   1 year ago Migraine without aura and without status migrainosus, not intractable   [facility], [place], [place]   1 year ago Atelectasis   [facility] [place], [place], [place]   1 year ago Class 2 severe obesity due to excess calories with serious comorbidity and body mass index (BMI) of 36.0 to 36.9 in adult [facility])   [facility] [place], [place], [place]       Future Appointments             In 3 months McGowan, Elana Alm [facility]                Requested Prescriptions  Pending Prescriptions Disp Refills   ALPRAZolam (XANAX) 1 MG tablet [Pharmacy Med Name: ALPRAZOLAM 1 MG TAB] 60 tablet     Sig: TAKE 1/2 TO 1 TABLET BY MOUTH 2 TIMES DAILY AS NEEDED FOR ANXIETY.     Not Delegated - Psychiatry: Anxiolytics/Hypnotics 2 Failed - 07/27/2021  3:01 PM      Failed - This refill cannot be delegated      Failed - Urine Drug Screen  completed in last 360 days      Passed - Patient is not pregnant      Passed - Valid encounter within last 6 months    Recent Outpatient Visits           3 months ago Sinusitis, unspecified chronicity, unspecified location   [facility] Malva Limes, MD   7 months ago Encounter for annual physical exam   [facility] Edwards, [place], PA-C   1 year ago Migraine without aura and without status migrainosus, not intractable   [facility], [place], [place]   1 year ago Atelectasis   [facility] [place], [place], [place]   1 year ago Class 2 severe obesity due to excess calories with serious comorbidity and body mass index (BMI) of 36.0 to 36.9 in adult [facility])   [facility] [place], [place], [place]       Future Appointments             In 3 months McGowan, Elana Alm [facility]

## 2021-07-27 NOTE — Telephone Encounter (Signed)
[facility] faxed refill request for the following medications:  Vitamin D, Ergocalciferol, (DRISDOL) 1.25 MG (50000 UNIT) CAPS capsule   Please advise.

## 2021-08-02 ENCOUNTER — Encounter (INDEPENDENT_AMBULATORY_CARE_PROVIDER_SITE_OTHER): Payer: BC Managed Care – PPO | Admitting: Physician Assistant

## 2021-08-02 DIAGNOSIS — E559 Vitamin D deficiency, unspecified: Secondary | ICD-10-CM | POA: Diagnosis not present

## 2021-08-02 NOTE — Telephone Encounter (Addendum)
aPlease see the MyChart message reply(ies) for my assessment and plan.    This patient gave consent for this Medical Advice Message and is aware that it may result in a bill to [employer], as well as the possibility of receiving a bill for a co-payment or deductible. They are an established patient, but are not seeking medical advice exclusively about a problem treated during an in person or video visit in the last seven days. I did not recommend an in person or video visit within seven days of my reply.    I spent a total of 5 minutes cumulative time within 7 days through [employer].  Alfredia Ferguson, PA-C

## 2021-09-07 DIAGNOSIS — M5416 Radiculopathy, lumbar region: Secondary | ICD-10-CM | POA: Diagnosis not present

## 2021-09-07 DIAGNOSIS — Z5181 Encounter for therapeutic drug level monitoring: Secondary | ICD-10-CM | POA: Diagnosis not present

## 2021-09-07 DIAGNOSIS — M7711 Lateral epicondylitis, right elbow: Secondary | ICD-10-CM | POA: Diagnosis not present

## 2021-09-07 DIAGNOSIS — M47817 Spondylosis without myelopathy or radiculopathy, lumbosacral region: Secondary | ICD-10-CM | POA: Diagnosis not present

## 2021-09-07 DIAGNOSIS — Z79891 Long term (current) use of opiate analgesic: Secondary | ICD-10-CM | POA: Diagnosis not present

## 2021-09-07 DIAGNOSIS — Z79899 Other long term (current) drug therapy: Secondary | ICD-10-CM | POA: Diagnosis not present

## 2021-09-21 ENCOUNTER — Other Ambulatory Visit: Payer: Self-pay | Admitting: Family Medicine

## 2021-09-21 DIAGNOSIS — F5101 Primary insomnia: Secondary | ICD-10-CM

## 2021-09-21 DIAGNOSIS — F411 Generalized anxiety disorder: Secondary | ICD-10-CM

## 2021-09-21 MED ORDER — ALPRAZOLAM 1 MG PO TABS: 0.5000 mg | ORAL_TABLET | Freq: Every evening | ORAL | 0 refills | Status: DC | PRN

## 2021-09-21 NOTE — Telephone Encounter (Signed)
Please review. Last visit (VV) 04/06/21.  KP

## 2021-09-21 NOTE — Telephone Encounter (Signed)
Called pt left VM that refill was sent in. Pts name was stated on VM.  KP

## 2021-10-18 NOTE — Progress Notes (Signed)
Vivien Rota DeSanto,acting as a Neurosurgeon for [employer], PA-C.,have documented all relevant documentation on the behalf of Debera Lat, PA-C,as directed by  [employer], PA-C while in the presence of [employer], PA-C.    Established patient visit   Patient: [name]   DOB: 06-06-1980   41 y.o. Female  MRN: [id number] Visit Date: 10/22/2021  Today's healthcare provider: Debera Lat, PA-C   CC : Vit d deficiency, med refill for anxiety  Subjective    HPI  Patient needs to have her Vitamin D medication refilled.  She currently has been taking 4000 units daily.  She has been out of her 50,000 units.  She has not had her level checked in several years but states it is chronically low.  Patient is also here for refill on her Alprazolam.  She states she began taking it for sleep and takes it now for anxiety. She states she uses it 2-3 times per week at most. Tried Lexapro, Paxil.  Uses [facility] Q week, Q other week Vit d level was 31.5 2 years ago  Medications: Outpatient Medications Prior to Visit  Medication Sig   ALPRAZolam (XANAX) 1 MG tablet Take 0.5 tablets (0.5 mg total) by mouth at bedtime as needed for anxiety or sleep.   gabapentin (NEURONTIN) 800 MG tablet Take 800 mg by mouth 4 (four) times daily.   methylphenidate (RITALIN) 5 MG tablet Take 5 mg by mouth daily.   Multiple Vitamin (MULTIVITAMIN WITH MINERALS) TABS tablet Take 1 tablet by mouth daily.   omeprazole (PRILOSEC) 20 MG capsule TAKE 1 CAPSULE BY MOUTH ONCE DAILY   Oxycodone HCl 10 MG TABS Take by mouth.   polyethylene glycol (MIRALAX / GLYCOLAX) packet Take 17 g by mouth every other day.    rizatriptan (MAXALT) 10 MG tablet TAKE 1 TABLET AS NEEDED FOR MIGRAINE. MAY REPEAT IN TWO HOURS IF NEEDED   valACYclovir (VALTREX) 500 MG tablet Take 2 tablets (1,000 mg total) by mouth 2 (two) times daily as needed (cold sores).   Vitamin D, Ergocalciferol, (DRISDOL) 1.25 MG (50000 UNIT) CAPS  capsule TAKE 1 CAPSULE (50,000 UNITS) BY MOUTH EVERY 7 DAYS   [DISCONTINUED] Erenumab-aooe (AIMOVIG) 70 MG/ML SOAJ Inject 1.12 mLs into the skin every 30 (thirty) days. (Patient not taking: Reported on 02/13/2021)   No facility-administered medications prior to visit.    Review of Systems  Constitutional:  Positive for fatigue. Negative for fever.  Respiratory:  Negative for shortness of breath and wheezing.   Cardiovascular:  Positive for palpitations. Negative for chest pain.  Psychiatric/Behavioral:  Positive for decreased concentration. Negative for agitation, behavioral problems, confusion, dysphoric mood, hallucinations, self-injury, sleep disturbance and suicidal ideas. The patient is nervous/anxious. The patient is not hyperactive.        Objective    BP 114/76 (BP Location: Right Arm, Patient Position: Sitting, Cuff Size: Normal)   Pulse 88   Temp 98.2 F (36.8 C) (Oral)   Wt 243 lb (110.2 kg)   SpO2 98%   BMI 38.06 kg/m      10/22/2021    1:14 PM 05/08/2020    9:58 AM 12/13/2019    9:52 AM 11/25/2018    1:35 PM 11/20/2017    2:26 PM  Depression screen PHQ 2/9  Decreased Interest 1 1 1  0 1  Down, Depressed, Hopeless 0 1 0 0 0  PHQ - 2 Score 1 2 1  0 1  Altered sleeping 1 1 1   1  Tired, decreased energy 1 1 2  2   Change in appetite 0 1 0  0  Feeling bad or failure about yourself  0 0 0  0  Trouble concentrating 0 1 1  0  Moving slowly or fidgety/restless 0 0 0  0  Suicidal thoughts 0 0 0  0  PHQ-9 Score 3 6 5  4   Difficult doing work/chores Not difficult at all Somewhat difficult Somewhat difficult  Somewhat difficult      10/22/2021    1:21 PM 02/02/2015    5:26 PM  GAD 7 : Generalized Anxiety Score  Nervous, Anxious, on Edge 2 3  Control/stop worrying 1 3  Worry too much - different things 2 3  Trouble relaxing 1 3  Restless 2 3  Easily annoyed or irritable 2 3  Afraid - awful might happen 1 3  Total GAD 7 Score 11 21  Anxiety Difficulty Very difficult  Very difficult        Physical Exam Constitutional:      General: She is not in acute distress.    Appearance: Normal appearance.  HENT:     Head: Normocephalic.  Pulmonary:     Effort: Pulmonary effort is normal. No respiratory distress.  Neurological:     General: No focal deficit present.     Mental Status: She is alert and oriented to person, place, and time. Mental status is at baseline.     Cranial Nerves: No cranial nerve deficit.     Sensory: No sensory deficit.     Motor: No weakness.     Coordination: Coordination normal.     Gait: Gait normal.     Deep Tendon Reflexes: Reflexes normal.  Psychiatric:        Behavior: Behavior normal.        Thought Content: Thought content normal.        Judgment: Judgment normal.       No results found for any visits on 10/22/21.  Assessment & Plan     1. Avitaminosis D  - CBC with Differential/Platelet - Comprehensive metabolic panel - VITAMIN D 25 Hydroxy (Vit-D Deficiency, Fractures) - Lipid panel  2. BMI 37.0-37.9, adult Work up - CBC with Differential/Platelet - Comprehensive metabolic panel - VITAMIN D 25 Hydroxy (Vit-D Deficiency, Fractures) - Lipid panel Weight loss of 5% of pt's current weight via healthy diet and daily exercise encouraged.   3. Mixed hyperlipidemia  - Lipid panel  4. Generalized anxiety disorder  - Comprehensive metabolic panel - VITAMIN D 25 Hydroxy (Vit-D Deficiency, Fractures) - ALPRAZolam (XANAX) 1 MG tablet; Take 0.5 tablets (0.5 mg total) by mouth at bedtime as needed for anxiety or sleep.  Dispense: 5 tablet; Refill: 0  5. Primary insomnia  - ALPRAZolam (XANAX) 1 MG tablet; Take 0.5 tablets (0.5 mg total) by mouth at bedtime as needed for anxiety or sleep.  Dispense: 5 tablet; Refill: 0  Fu with PCP The patient was advised to call back or seek an in-person evaluation if the symptoms worsen or if the condition fails to improve as anticipated.  I discussed the assessment  and treatment plan with the patient. The patient was provided an opportunity to ask questions and all were answered. The patient agreed with the plan and demonstrated an understanding of the instructions.  The entirety of the information documented in the History of Present Illness, Review of Systems and Physical Exam were personally obtained by me. Portions of this information were initially documented by  the CMA and reviewed by me for thoroughness and accuracy.  Portions of this note were created using dictation software and may contain typographical errors.          Total encounter time more than 30 minutes  Greater than 50% was spent in counseling and coordination of care with the patient   Cherlynn Polo  [facility] [phone] (phone) [phone] (fax)  [facility]

## 2021-10-22 ENCOUNTER — Ambulatory Visit: Payer: BC Managed Care – PPO | Admitting: Physician Assistant

## 2021-10-22 VITALS — BP 114/76 | HR 88 | Temp 98.2°F | Wt 243.0 lb

## 2021-10-22 DIAGNOSIS — F411 Generalized anxiety disorder: Secondary | ICD-10-CM | POA: Diagnosis not present

## 2021-10-22 DIAGNOSIS — F5101 Primary insomnia: Secondary | ICD-10-CM

## 2021-10-22 DIAGNOSIS — Z6837 Body mass index (BMI) 37.0-37.9, adult: Secondary | ICD-10-CM

## 2021-10-22 DIAGNOSIS — E782 Mixed hyperlipidemia: Secondary | ICD-10-CM | POA: Diagnosis not present

## 2021-10-22 DIAGNOSIS — E559 Vitamin D deficiency, unspecified: Secondary | ICD-10-CM | POA: Diagnosis not present

## 2021-10-22 MED ORDER — ALPRAZOLAM 1 MG PO TABS: 0.5000 mg | ORAL_TABLET | Freq: Every evening | ORAL | 0 refills | Status: DC | PRN

## 2021-10-30 ENCOUNTER — Ambulatory Visit: Payer: BC Managed Care – PPO | Admitting: Urology

## 2021-10-30 ENCOUNTER — Encounter: Payer: Self-pay | Admitting: Urology

## 2021-10-30 ENCOUNTER — Ambulatory Visit
Admission: RE | Admit: 2021-10-30 | Discharge: 2021-10-30 | Disposition: A | Discharge status: Home / Self Care | Payer: BC Managed Care – PPO | Attending: Urology | Admitting: Urology

## 2021-10-30 ENCOUNTER — Ambulatory Visit
Admission: RE | Admit: 2021-10-30 | Discharge: 2021-10-30 | Disposition: A | Discharge status: Home / Self Care | Payer: BC Managed Care – PPO | Source: Ambulatory Visit | Attending: Urology | Admitting: Urology

## 2021-10-30 ENCOUNTER — Other Ambulatory Visit: Payer: Self-pay | Admitting: Family Medicine

## 2021-10-30 VITALS — BP 114/80 | HR 89 | Ht 67.0 in | Wt 240.0 lb

## 2021-10-30 DIAGNOSIS — R109 Unspecified abdominal pain: Secondary | ICD-10-CM | POA: Insufficient documentation

## 2021-10-30 DIAGNOSIS — R10A0 Flank pain, unspecified side: Secondary | ICD-10-CM

## 2021-10-30 DIAGNOSIS — N200 Calculus of kidney: Secondary | ICD-10-CM

## 2021-10-30 DIAGNOSIS — N83202 Unspecified ovarian cyst, left side: Secondary | ICD-10-CM

## 2021-10-30 DIAGNOSIS — Z9049 Acquired absence of other specified parts of digestive tract: Secondary | ICD-10-CM | POA: Diagnosis not present

## 2021-10-30 DIAGNOSIS — R3129 Other microscopic hematuria: Secondary | ICD-10-CM | POA: Insufficient documentation

## 2021-10-30 DIAGNOSIS — Z90710 Acquired absence of both cervix and uterus: Secondary | ICD-10-CM | POA: Diagnosis not present

## 2021-10-30 LAB — MICROSCOPIC EXAMINATION

## 2021-10-30 LAB — URINALYSIS, COMPLETE
Bilirubin, UA: NEGATIVE
Glucose, UA: NEGATIVE
Ketones, UA: NEGATIVE
Leukocytes,UA: NEGATIVE
Nitrite, UA: NEGATIVE
Protein,UA: NEGATIVE
Specific Gravity, UA: 1.02 (ref 1.005–1.030)
Urobilinogen, Ur: 0.2 mg/dL (ref 0.2–1.0)
pH, UA: 6 (ref 5.0–7.5)

## 2021-10-30 MED ORDER — SULFAMETHOXAZOLE-TRIMETHOPRIM 800-160 MG PO TABS: 1.0000 | ORAL_TABLET | Freq: Two times a day (BID) | ORAL | 0 refills | Status: DC

## 2021-10-30 NOTE — Progress Notes (Signed)
10/30/2021 4:10 PM   [name] [birth year] [id number]  Referring provider: Alfredia Ferguson, PA-C [address] [place],  [place] [address]  Urological history: 1. Nephrolithiasis -ESWL x 4 and URS x 1 -Stone analysis shows 5% calcium oxalate dihydrate, 45% calcium oxalate monohydrate, and 50% calcium phosphate -CT urogram 08/2020 - No hydronephrosis. Small nonobstructive bilateral renal stones measuring up to 4 mm in the lower pole on the right and 3 mm in the lower pole on the left  2. Intermediate risk hematuria -former smoker -CT renal stone 10/2019 bilateral nephrolithiasis -cysto 10/2019 NED -CTU 08/2020 - Bilateral nonobstructive nephrolithiasis -no reports of gross heme -UA 11-30 RBC's   Chief Complaint  Patient presents with   Nephrolithiasis     HPI: [name] is a 41 y.o. female who presents today for an urgent visit for possible kidney stone.   A week and a half ago, she was up all night with cramping.  This was self-limiting.  The cramping returned over the weekend.   The cramping was located on the left side.  She voided pink tinged urine as well.  She has been nauseated.  Patient denies any modifying or aggravating factors.  Patient denies any gross hematuria, dysuria or suprapubic/flank pain.  Patient denies any fevers, chills or vomiting.    She feels this is similar to previous stones episodes.    UA 11-30 RBC's  KUB bilateral nephrolithiasis.    CT renal stone study (10/2021) 5 stones in the left kidney and 3 in the right, largest stone measuring 4 mm.  Left ovarian cyst.   PMH: Past Medical History:  Diagnosis Date   Acute pyelonephritis 06/29/2012   Anxiety    Arthritis    Asthma    AS A CHILD   Complication of anesthesia    TROUBLE URINATING IN THE PAST   Ehlers-Danlos syndrome    Gallstones    GERD (gastroesophageal reflux disease)    History of kidney stones    Kidney stones    bilateral   Migraine    Pelvic  pain in female    PONV (postoperative nausea and vomiting)    WITH HYSTERECTOMY ONLY   Pyelonephritis 2015   Renal mass 2017   right. considered a normal cyst on kidney.   Tobacco abuse     Surgical History: Past Surgical History:  Procedure Laterality Date   ABDOMINAL HYSTERECTOMY  2006   ANKLE ARTHROSCOPY Left 2017   ANKLE ARTHROSCOPY Left 11/24/2015   APPENDECTOMY  07/26/13   CAST APPLICATION  12/25/2011   Procedure: CAST APPLICATION;  Surgeon: Javier Docker, MD;  Location: [facility];  Service: Orthopedics;  Laterality: Left;   CHOLECYSTECTOMY  03/11/2011   Procedure: LAPAROSCOPIC CHOLECYSTECTOMY WITH INTRAOPERATIVE CHOLANGIOGRAM;  Surgeon: Atilano Ina, MD;  Location: [facility];  Service: General;  Laterality: N/A;   CHOLECYSTECTOMY, LAPAROSCOPIC     CYSTOSCOPY/URETEROSCOPY/HOLMIUM LASER/STENT PLACEMENT Left 06/18/2016   Procedure: CYSTOSCOPY/URETEROSCOPY/HOLMIUM LASER/STENT PLACEMENT;  Surgeon: Vanna Scotland, MD;  Location: [facility] ORS;  Service: Urology;  Laterality: Left;   LAPAROSCOPIC OVARIAN CYSTECTOMY Right 09/02/2017   Procedure: LAPAROSCOPIC OVARIAN CYSTECTOMY;  Surgeon: Nadara Mustard, MD;  Location: [facility];  Service: Gynecology;  Laterality: Right;   LITHOTRIPSY     x 4   ORIF ANKLE FRACTURE  12/25/2011   Procedure: OPEN REDUCTION INTERNAL FIXATION (ORIF) ANKLE FRACTURE;  Surgeon: Javier Docker, MD;  Location: [facility];  Service: Orthopedics;  Laterality: Left;   TONSILLECTOMY  Home Medications:  Allergies as of 10/30/2021       Reactions   Compazine Other (See Comments)   Dystonia   Metoclopramide Other (See Comments)   dystonia        Medication List        Accurate as of October 30, 2021  4:10 PM. If you have any questions, ask your nurse or doctor.          ALPRAZolam 1 MG tablet Commonly known as: XANAX Take 0.5 tablets (0.5 mg total) by mouth at bedtime as needed for anxiety or sleep.   gabapentin 800 MG tablet Commonly known as:  NEURONTIN Take 800 mg by mouth 4 (four) times daily.   methylphenidate 5 MG tablet Commonly known as: RITALIN Take 5 mg by mouth daily.   multivitamin with minerals Tabs tablet Take 1 tablet by mouth daily.   omeprazole 20 MG capsule Commonly known as: PRILOSEC TAKE 1 CAPSULE BY MOUTH ONCE DAILY   Oxycodone HCl 10 MG Tabs Take by mouth.   polyethylene glycol 17 g packet Commonly known as: MIRALAX / GLYCOLAX Take 17 g by mouth every other day.   rizatriptan 10 MG tablet Commonly known as: MAXALT TAKE 1 TABLET AS NEEDED FOR MIGRAINE. MAY REPEAT IN TWO HOURS IF NEEDED   sulfamethoxazole-trimethoprim 800-160 MG tablet Commonly known as: BACTRIM DS Take 1 tablet by mouth every 12 (twelve) hours. Started by: Michiel Cowboy, PA-C   valACYclovir 500 MG tablet Commonly known as: VALTREX Take 2 tablets (1,000 mg total) by mouth 2 (two) times daily as needed (cold sores).   Vitamin D (Ergocalciferol) 1.25 MG (50000 UNIT) Caps capsule Commonly known as: DRISDOL TAKE 1 CAPSULE (50,000 UNITS) BY MOUTH EVERY 7 DAYS        Allergies:  Allergies  Allergen Reactions   Compazine Other (See Comments)    Dystonia   Metoclopramide Other (See Comments)    dystonia    Family History: Family History  Problem Relation Age of Onset   Diabetes Mother    Diabetes Father    Heart failure Father    Hypertension Father    Pulmonary fibrosis Father        Secondary to amiodarone   Nephrolithiasis Father    Arrhythmia Father        Suspected ventricular tachycardia/fibrillation   Other Brother        Hypermobile joints   Kidney cancer Neg Hx    Bladder Cancer Neg Hx     Social History:  reports that she quit smoking about 7 years ago. Her smoking use included cigarettes. She started smoking about 22 years ago. She has a 7.50 pack-year smoking history. She has never used smokeless tobacco. She reports that she does not currently use alcohol. She reports that she does not use  drugs.  ROS: Pertinent ROS in HPI  Physical Exam: BP 114/80   Pulse 89   Ht 5\' 7"  (1.702 m)   Wt 240 lb (108.9 kg)   BMI 37.59 kg/m   Constitutional:  Well nourished. Alert and oriented, No acute distress. HEENT: [place] AT, moist mucus membranes.  Trachea midline Cardiovascular: No clubbing, cyanosis, or edema. Respiratory: Normal respiratory effort, no increased work of breathing. Neurologic: Grossly intact, no focal deficits, moving all 4 extremities. Psychiatric: Normal mood and affect.    Laboratory Data: Urinalysis See Epic and HPI. I have reviewed the labs.   Pertinent Imaging: CLINICAL DATA:  Flank pain.  Kidney stone suspected   EXAM: CT  ABDOMEN AND PELVIS WITHOUT CONTRAST   TECHNIQUE: Multidetector CT imaging of the abdomen and pelvis was performed following the standard protocol without IV contrast.   RADIATION DOSE REDUCTION: This exam was performed according to the departmental dose-optimization program which includes automated exposure control, adjustment of the mA and/or kV according to patient size and/or use of iterative reconstruction technique.   COMPARISON:  None Available.   FINDINGS: Lower chest: Lung bases are clear.   Hepatobiliary: No focal hepatic lesion. Postcholecystectomy. No biliary dilatation.   Pancreas: Pancreas is normal. No ductal dilatation. No pancreatic inflammation.   Spleen: Normal spleen   Adrenals/urinary tract: Adrenal glands normal. Three nonobstructing calculi in the RIGHT kidney measuring 1-3 mm. RIGHT ureterolithiasis or obstructive uropathy.   Four nonobstructing calculi in the LEFT kidney ranging size from 2-4 mm. No LEFT ureterolithiasis or obstructive uropathy. No bladder calculi.   Stomach/Bowel: Stomach, small-bowel and cecum normal. Post appendectomy. The colon and rectosigmoid colon are normal.   Vascular/Lymphatic: Abdominal aorta is normal caliber with atherosclerotic calcification. There is no  retroperitoneal or periportal lymphadenopathy. No pelvic lymphadenopathy.   Reproductive: Post hysterectomy. Interval decrease in size of cystic lesion associated with the LEFT adnexa. Residual complex measures 3.9 by 2.6 cm with a central round density measuring 15 mm. Previously there was a 4 cm cyst associated with the LEFT ovary. RIGHT ovary not identified   Other: No free fluid.   Musculoskeletal: No aggressive osseous lesion.   IMPRESSION: 1. Bilateral nephrolithiasis. No ureterolithiasis or obstructive uropathy. 2. No bladder calculi. 3. Interval decrease in size of cystic complex associated with the LEFT adnexa. Post hysterectomy anatomy. 4. Post appendectomy.  No complicating features. 5. No bowel inflammation or infection identified.     Electronically Signed   By: Genevive Bi M.D.   On: 10/30/2021 15:52 I have independently reviewed the films.  See HPI.   Assessment & Plan:    1. Left flank pain -CT renal study order STAT today- bilateral nephrolithiasis w/o hydro or ureteral stones -? Of pain secondary to ovarian cyst vs UTI -KUB with bilateral nephrolithiasis -UA w/ micro heme -urine culture  -start Septra empirically while awaiting cultures   2. High risk hematuria -CT Urogram with bilateral nephrolithiasis 08/2020 -cysto NED 10/2019 -reports of gross heme -UA 11-30 RBC's   3. Left ovarian cyst -CT renal stone study (10/2021) - Interval decrease in size of cystic lesion associated with the LEFT adnexa. Residual complex measures 3.9 by 2.6 cm with a central round density measuring 15 mm.  Previously there was a 4 cm cyst associated with the LEFT ovary. RIGHT ovary not identified  -refer to gynecology   Return for refer to gynecology for ovarian cyst .  These notes generated with voice recognition software. I apologize for typographical errors.  Cloretta Ned  [facility] [address]  [address] [place], [place] [address] [phone]

## 2021-11-03 LAB — CULTURE, URINE COMPREHENSIVE

## 2021-11-05 ENCOUNTER — Telehealth: Payer: Self-pay

## 2021-11-06 ENCOUNTER — Ambulatory Visit: Payer: BC Managed Care – PPO | Admitting: Physician Assistant

## 2021-11-06 ENCOUNTER — Encounter: Payer: Self-pay | Admitting: Physician Assistant

## 2021-11-06 VITALS — BP 121/80 | HR 81 | Ht 67.0 in | Wt 237.7 lb

## 2021-11-06 DIAGNOSIS — R232 Flushing: Secondary | ICD-10-CM | POA: Insufficient documentation

## 2021-11-06 DIAGNOSIS — N83202 Unspecified ovarian cyst, left side: Secondary | ICD-10-CM | POA: Diagnosis not present

## 2021-11-06 DIAGNOSIS — F419 Anxiety disorder, unspecified: Secondary | ICD-10-CM

## 2021-11-06 DIAGNOSIS — E782 Mixed hyperlipidemia: Secondary | ICD-10-CM | POA: Diagnosis not present

## 2021-11-06 DIAGNOSIS — E559 Vitamin D deficiency, unspecified: Secondary | ICD-10-CM

## 2021-11-06 DIAGNOSIS — K2900 Acute gastritis without bleeding: Secondary | ICD-10-CM | POA: Diagnosis not present

## 2021-11-06 MED ORDER — ALPRAZOLAM 0.5 MG PO TABS: 0.5000 mg | ORAL_TABLET | Freq: Two times a day (BID) | ORAL | 3 refills | Status: DC | PRN

## 2021-11-06 NOTE — Assessment & Plan Note (Signed)
May be peri-menopausal Will check labs, tsh/t4

## 2021-11-06 NOTE — Progress Notes (Signed)
I,Sha'taria Tyson,acting as a Neurosurgeon for [employer], PA-C.,have documented all relevant documentation on the behalf of Alfredia Ferguson, PA-C,as directed by  Alfredia Ferguson, PA-C while in the presence of Alfredia Ferguson, PA-C.   Established patient visit   Patient: [name]   DOB: August 07, 1980   41 y.o. Female  MRN: [id number] Visit Date: 11/06/2021  Today's healthcare provider: Alfredia Ferguson, PA-C   Cc. Ct scan questions, anxiety f/u  Subjective    HPI   [name] is a 41 y/o female who presents today to discuss her recent CT with urology for kidney stones.  She also endorses 2 weeks of nausea/vomiting, bloating, and continued left sided flank pain. Nausea/vomiting has resolved but she still feels like she cannot eat much 2/2 bloating and fullness. Reports more diarrhea than constipation. Denies blood in stool.   Anxiety --reports history of multiple failed anxiety/depressant medications --takes xanax 0.5 mg prn, ~15 / month for high levels of anxiety/sleep.  --She is in therapy around 1-2 times a month.   Unsure if she is in menopause or near, s/p hysterectomy with R oophorectomy. Feeling mood swings, hot flashes.   Medications: Outpatient Medications Prior to Visit  Medication Sig   gabapentin (NEURONTIN) 800 MG tablet Take 800 mg by mouth 4 (four) times daily.   methylphenidate (RITALIN) 5 MG tablet Take 5 mg by mouth daily.   Multiple Vitamin (MULTIVITAMIN WITH MINERALS) TABS tablet Take 1 tablet by mouth daily.   omeprazole (PRILOSEC) 20 MG capsule TAKE 1 CAPSULE BY MOUTH ONCE DAILY   Oxycodone HCl 10 MG TABS Take by mouth.   polyethylene glycol (MIRALAX / GLYCOLAX) packet Take 17 g by mouth every other day.    rizatriptan (MAXALT) 10 MG tablet TAKE 1 TABLET AS NEEDED FOR MIGRAINE. MAY REPEAT IN TWO HOURS IF NEEDED   sulfamethoxazole-trimethoprim (BACTRIM DS) 800-160 MG tablet Take 1 tablet by mouth every 12 (twelve) hours.   valACYclovir (VALTREX) 500 MG  tablet Take 2 tablets (1,000 mg total) by mouth 2 (two) times daily as needed (cold sores).   Vitamin D, Ergocalciferol, (DRISDOL) 1.25 MG (50000 UNIT) CAPS capsule TAKE 1 CAPSULE (50,000 UNITS) BY MOUTH EVERY 7 DAYS   [DISCONTINUED] ALPRAZolam (XANAX) 1 MG tablet Take 0.5 tablets (0.5 mg total) by mouth at bedtime as needed for anxiety or sleep.   No facility-administered medications prior to visit.    Review of Systems  Constitutional:  Negative for fatigue and fever.  Respiratory:  Negative for cough and shortness of breath.   Cardiovascular:  Negative for chest pain and leg swelling.  Gastrointestinal:  Positive for abdominal pain, diarrhea and nausea.  Genitourinary:  Positive for pelvic pain.  Neurological:  Negative for dizziness and headaches.      Objective    Blood pressure 121/80, pulse 81, height 5\' 7"  (1.702 m), weight 237 lb 11.2 oz (107.8 kg), SpO2 100 %.   Physical Exam Constitutional:      General: She is awake.     Appearance: She is well-developed.  HENT:     Head: Normocephalic.  Eyes:     Conjunctiva/sclera: Conjunctivae normal.  Cardiovascular:     Rate and Rhythm: Normal rate and regular rhythm.     Heart sounds: Normal heart sounds.  Pulmonary:     Effort: Pulmonary effort is normal.     Breath sounds: Normal breath sounds.  Abdominal:     General: Abdomen is protuberant.     Palpations: Abdomen is soft.  Tenderness: There is abdominal tenderness in the left lower quadrant. There is no guarding or rebound.  Skin:    General: Skin is warm.  Neurological:     Mental Status: She is alert and oriented to person, place, and time.  Psychiatric:        Attention and Perception: Attention normal.        Mood and Affect: Mood normal.        Speech: Speech normal.        Behavior: Behavior is cooperative.     No results found for any visits on 11/06/21.  Assessment & Plan     Problem List Items Addressed This Visit       Cardiovascular and  Mediastinum   Hot flashes    May be peri-menopausal Will check labs, tsh/t4      Relevant Orders   CBC w/Diff/Platelet   Comprehensive Metabolic Panel (CMET)   TSH + free T4     Digestive   Acute gastritis without hemorrhage    May be source of GI symptoms -- encouraged bland diet, increase hydration. Pt is compliant with omeprazole  If symptoms continue return to office        Endocrine   Cyst of ovary - Primary    Seen on CT renal stone study L ovary, appears smaller than previous on CT but may be source of continued pain Refer to new gyn as her last MD has left In the meantime ordered repeat pelvic ultrasound      Relevant Orders   US Pelvis Complete   Ambulatory referral to Gynecology     Other   Hyperlipidemia    Historically elevated fasting today, repeat      Relevant Orders   Comprehensive Metabolic Panel (CMET)   Lipid Profile   Anxiety     xanax 0.5 mg prn severe anxiety pt reports previous failure of several (not with this office) anxiety/depressant medications Pt in therapy Advised as long as she keep her use to <15 a month I am comfortable with prn benzo  Pt aware and agrees         Relevant Medications   ALPRAZolam (XANAX) 0.5 MG tablet   Other Visit Diagnoses     Avitaminosis D       Relevant Orders   VITAMIN D 25 Hydroxy (Vit-D Deficiency, Fractures)        Return in about 6 weeks (around 12/18/2021) for CPE.      I, Alfredia Ferguson, PA-C have reviewed all documentation for this visit. The documentation on  11/06/2021  for the exam, diagnosis, procedures, and orders are all accurate and complete.  Alfredia Ferguson, PA-C [facility] [address] [place], [place], [address] Office: [phone] Fax: [phone]   [facility]

## 2021-11-06 NOTE — Assessment & Plan Note (Addendum)
Seen on CT renal stone study L ovary, appears smaller than previous on CT but may be source of continued pain Refer to new gyn as her last MD has left In the meantime ordered repeat pelvic ultrasound

## 2021-11-06 NOTE — Assessment & Plan Note (Addendum)
  xanax 0.5 mg prn severe anxiety pt reports previous failure of several (not with this office) anxiety/depressant medications Pt in therapy Advised as long as she keep her use to <15 a month I am comfortable with prn benzo  Pt aware and agrees

## 2021-11-06 NOTE — Telephone Encounter (Signed)
Patient seen in office

## 2021-11-06 NOTE — Assessment & Plan Note (Addendum)
May be source of GI symptoms -- encouraged bland diet, increase hydration. Pt is compliant with omeprazole  If symptoms continue return to office

## 2021-11-06 NOTE — Assessment & Plan Note (Signed)
Historically elevated fasting today, repeat

## 2021-11-07 ENCOUNTER — Other Ambulatory Visit: Payer: Self-pay | Admitting: Physician Assistant

## 2021-11-07 DIAGNOSIS — N83202 Unspecified ovarian cyst, left side: Secondary | ICD-10-CM

## 2021-11-07 LAB — COMPREHENSIVE METABOLIC PANEL
ALT: 13 IU/L (ref 0–32)
AST: 11 IU/L (ref 0–40)
Albumin/Globulin Ratio: 1.6 (ref 1.2–2.2)
Albumin: 4.1 g/dL (ref 3.9–4.9)
Alkaline Phosphatase: 118 IU/L (ref 44–121)
BUN/Creatinine Ratio: 10 (ref 9–23)
BUN: 7 mg/dL (ref 6–24)
Bilirubin Total: <0.2 mg/dL (ref 0.0–1.2)
CO2: 26 mmol/L (ref 20–29)
Calcium: 9.4 mg/dL (ref 8.7–10.2)
Chloride: 102 mmol/L (ref 96–106)
Creatinine, Ser: 0.73 mg/dL (ref 0.57–1.00)
Globulin, Total: 2.6 g/dL (ref 1.5–4.5)
Glucose: 89 mg/dL (ref 70–99)
Potassium: 4.3 mmol/L (ref 3.5–5.2)
Sodium: 139 mmol/L (ref 134–144)
Total Protein: 6.7 g/dL (ref 6.0–8.5)
eGFR: 105 mL/min/{1.73_m2} (ref >59)

## 2021-11-07 LAB — CBC WITH DIFFERENTIAL/PLATELET
Basophils Absolute: 0.1 10*3/uL (ref 0.0–0.2)
Basos: 1 %
EOS (ABSOLUTE): 0.2 10*3/uL (ref 0.0–0.4)
Eos: 2 %
Hematocrit: 42.8 % (ref 34.0–46.6)
Hemoglobin: 14 g/dL (ref 11.1–15.9)
Immature Grans (Abs): 0 10*3/uL (ref 0.0–0.1)
Immature Granulocytes: 0 %
Lymphocytes Absolute: 3 10*3/uL (ref 0.7–3.1)
Lymphs: 29 %
MCH: 29.8 pg (ref 26.6–33.0)
MCHC: 32.7 g/dL (ref 31.5–35.7)
MCV: 91 fL (ref 79–97)
Monocytes Absolute: 0.6 10*3/uL (ref 0.1–0.9)
Monocytes: 6 %
Neutrophils Absolute: 6.6 10*3/uL (ref 1.4–7.0)
Neutrophils: 62 %
Platelets: 299 10*3/uL (ref 150–450)
RBC: 4.7 x10E6/uL (ref 3.77–5.28)
RDW: 13 % (ref 11.7–15.4)
WBC: 10.5 10*3/uL (ref 3.4–10.8)

## 2021-11-07 LAB — TSH+FREE T4
Free T4: 1.05 ng/dL (ref 0.82–1.77)
TSH: 1.5 u[IU]/mL (ref 0.450–4.500)

## 2021-11-07 LAB — LIPID PANEL
Chol/HDL Ratio: 5.3 ratio — ABNORMAL HIGH (ref 0.0–4.4)
Cholesterol, Total: 211 mg/dL — ABNORMAL HIGH (ref 100–199)
HDL: 40 mg/dL (ref >39)
LDL Chol Calc (NIH): 127 mg/dL — ABNORMAL HIGH (ref 0–99)
Triglycerides: 247 mg/dL — ABNORMAL HIGH (ref 0–149)
VLDL Cholesterol Cal: 44 mg/dL — ABNORMAL HIGH (ref 5–40)

## 2021-11-07 LAB — VITAMIN D 25 HYDROXY (VIT D DEFICIENCY, FRACTURES): Vit D, 25-Hydroxy: 19.5 ng/mL — ABNORMAL LOW (ref 30.0–100.0)

## 2021-11-09 DIAGNOSIS — H16211 Exposure keratoconjunctivitis, right eye: Secondary | ICD-10-CM | POA: Diagnosis not present

## 2021-11-09 DIAGNOSIS — H11441 Conjunctival cysts, right eye: Secondary | ICD-10-CM | POA: Diagnosis not present

## 2021-11-12 ENCOUNTER — Ambulatory Visit
Admission: RE | Admit: 2021-11-12 | Discharge: 2021-11-12 | Disposition: A | Discharge status: Home / Self Care | Payer: BC Managed Care – PPO | Source: Ambulatory Visit | Attending: Physician Assistant | Admitting: Physician Assistant

## 2021-11-12 DIAGNOSIS — Z90710 Acquired absence of both cervix and uterus: Secondary | ICD-10-CM | POA: Diagnosis not present

## 2021-11-12 DIAGNOSIS — N83202 Unspecified ovarian cyst, left side: Secondary | ICD-10-CM | POA: Insufficient documentation

## 2021-11-21 ENCOUNTER — Other Ambulatory Visit: Payer: Self-pay

## 2021-11-21 DIAGNOSIS — N200 Calculus of kidney: Secondary | ICD-10-CM

## 2021-11-21 NOTE — Progress Notes (Deleted)
11/22/2021 9:16 PM   [name] [name] [birth year] [id number]  Referring provider: [name], PA-C [address] [place],  [place] [address]  Urological history: 1. Nephrolithiasis -ESWL x 4 and URS x 1 -Stone analysis shows 5% calcium oxalate dihydrate, 45% calcium oxalate monohydrate, and 50% calcium phosphate -CT renal stone study (10/2021) - Bilateral nephrolithiasis  2. Intermediate risk hematuria -former smoker -CT renal stone 10/2019 bilateral nephrolithiasis -cysto 10/2019 NED -CTU 08/2020 - Bilateral nonobstructive nephrolithiasis -no reports of gross heme -UA ***  No chief complaint on file.    HPI: [name] is a 41 y.o. female who presents today for a yearly visit.    UA ***   PMH: Past Medical History:  Diagnosis Date   Acute pyelonephritis 06/29/2012   Anxiety    Arthritis    Asthma    AS A CHILD   Complication of anesthesia    TROUBLE URINATING IN THE PAST   Ehlers-Danlos syndrome    Gallstones    GERD (gastroesophageal reflux disease)    History of kidney stones    Kidney stones    bilateral   Migraine    Pelvic pain in female    PONV (postoperative nausea and vomiting)    WITH HYSTERECTOMY ONLY   Pyelonephritis 2015   Renal mass 2017   right. considered a normal cyst on kidney.   Tobacco abuse     Surgical History: Past Surgical History:  Procedure Laterality Date   ABDOMINAL HYSTERECTOMY  2006   ANKLE ARTHROSCOPY Left 2017   ANKLE ARTHROSCOPY Left 11/24/2015   APPENDECTOMY  07/26/13   CAST APPLICATION  12/25/2011   Procedure: CAST APPLICATION;  Surgeon: Javier Docker, MD;  [facility];  Service: Orthopedics;  Laterality: Left;   CHOLECYSTECTOMY  03/11/2011   Procedure: LAPAROSCOPIC CHOLECYSTECTOMY WITH INTRAOPERATIVE CHOLANGIOGRAM;  Surgeon: Atilano Ina, MD;  Location: [facility];  Service: General;  Laterality: N/A;   CHOLECYSTECTOMY, LAPAROSCOPIC     CYSTOSCOPY/URETEROSCOPY/HOLMIUM  LASER/STENT PLACEMENT Left 06/18/2016   Procedure: CYSTOSCOPY/URETEROSCOPY/HOLMIUM LASER/STENT PLACEMENT;  Surgeon: Vanna Scotland, MD;  Location: [facility];  Service: Urology;  Laterality: Left;   LAPAROSCOPIC OVARIAN CYSTECTOMY Right 09/02/2017   Procedure: LAPAROSCOPIC OVARIAN CYSTECTOMY;  Surgeon: Nadara Mustard, MD;  Location: [facility];  Service: Gynecology;  Laterality: Right;   LITHOTRIPSY     x 4   ORIF ANKLE FRACTURE  12/25/2011   Procedure: OPEN REDUCTION INTERNAL FIXATION (ORIF) ANKLE FRACTURE;  Surgeon: Javier Docker, MD;  Location: [facility];  Service: Orthopedics;  Laterality: Left;   TONSILLECTOMY      Home Medications:  Allergies as of 11/22/2021       Reactions   Compazine Other (See Comments)   Dystonia   Metoclopramide Other (See Comments)   dystonia        Medication List        Accurate as of November 21, 2021  9:16 PM. If you have any questions, ask your nurse or doctor.          ALPRAZolam 0.5 MG tablet Commonly known as: XANAX Take 1 tablet (0.5 mg total) by mouth 2 (two) times daily as needed for anxiety.   gabapentin 800 MG tablet Commonly known as: NEURONTIN Take 800 mg by mouth 4 (four) times daily.   methylphenidate 5 MG tablet Commonly known as: RITALIN Take 5 mg by mouth daily.   multivitamin with minerals Tabs tablet Take 1 tablet by mouth daily.   omeprazole  20 MG capsule Commonly known as: PRILOSEC TAKE 1 CAPSULE BY MOUTH ONCE DAILY   Oxycodone HCl 10 MG Tabs Take by mouth.   polyethylene glycol 17 g packet Commonly known as: MIRALAX / GLYCOLAX Take 17 g by mouth every other day.   rizatriptan 10 MG tablet Commonly known as: MAXALT TAKE 1 TABLET AS NEEDED FOR MIGRAINE. MAY REPEAT IN TWO HOURS IF NEEDED   sulfamethoxazole-trimethoprim 800-160 MG tablet Commonly known as: BACTRIM DS Take 1 tablet by mouth every 12 (twelve) hours.   valACYclovir 500 MG tablet Commonly known as: VALTREX Take 2 tablets (1,000 mg total) by  mouth 2 (two) times daily as needed (cold sores).   Vitamin D (Ergocalciferol) 1.25 MG (50000 UNIT) Caps capsule Commonly known as: DRISDOL TAKE 1 CAPSULE (50,000 UNITS) BY MOUTH EVERY 7 DAYS        Allergies:  Allergies  Allergen Reactions   Compazine Other (See Comments)    Dystonia   Metoclopramide Other (See Comments)    dystonia    Family History: Family History  Problem Relation Age of Onset   Diabetes Mother    Diabetes Father    Heart failure Father    Hypertension Father    Pulmonary fibrosis Father        Secondary to amiodarone   Nephrolithiasis Father    Arrhythmia Father        Suspected ventricular tachycardia/fibrillation   Other Brother        Hypermobile joints   Kidney cancer Neg Hx    Bladder Cancer Neg Hx     Social History:  reports that she quit smoking about 7 years ago. Her smoking use included cigarettes. She started smoking about 22 years ago. She has a 7.50 pack-year smoking history. She has never used smokeless tobacco. She reports that she does not currently use alcohol. She reports that she does not use drugs.  ROS: Pertinent ROS in HPI  Physical Exam: There were no vitals taken for this visit.  Constitutional:  Well nourished. Alert and oriented, No acute distress. HEENT: [place] AT, moist mucus membranes.  Trachea midline, no masses. Cardiovascular: No clubbing, cyanosis, or edema. Respiratory: Normal respiratory effort, no increased work of breathing. GI: Abdomen is soft, non tender, non distended, no abdominal masses. Liver and spleen not palpable.  No hernias appreciated.  Stool sample for occult testing is not indicated.   GU: No CVA tenderness.  No bladder fullness or masses.  *** external genitalia, *** pubic hair distribution, no lesions.  Normal urethral meatus, no lesions, no prolapse, no discharge.   No urethral masses, tenderness and/or tenderness. No bladder fullness, tenderness or masses. *** vagina mucosa, *** estrogen effect,  no discharge, no lesions, *** pelvic support, *** cystocele and *** rectocele noted.  No cervical motion tenderness.  Uterus is freely mobile and non-fixed.  No adnexal/parametria masses or tenderness noted.  Anus and perineum are without rashes or lesions.   ***  Skin: No rashes, bruises or suspicious lesions. Lymph: No cervical or inguinal adenopathy. Neurologic: Grossly intact, no focal deficits, moving all 4 extremities. Psychiatric: Normal mood and affect.    Laboratory Data:    Latest Ref Rng & Units 11/06/2021   11:24 AM 06/08/2019   12:49 PM 03/29/2019    1:11 PM  CMP  Glucose 70 - 99 mg/dL 89  80  81   BUN 6 - 24 mg/dL 7  8  7    Creatinine 0.57 - 1.00 mg/dL 0.73  0.55  0.67   Sodium 134 - 144 mmol/L 139  138  140   Potassium 3.5 - 5.2 mmol/L 4.3  3.9  3.9   Chloride 96 - 106 mmol/L 102  104  101   CO2 20 - 29 mmol/L 26  24  22    Calcium 8.7 - 10.2 mg/dL 9.4  9.2  9.5   Total Protein 6.0 - 8.5 g/dL 6.7   6.5   Total Bilirubin 0.0 - 1.2 mg/dL   0.2   Alkaline Phos 44 - 121 IU/L 118   85   AST 0 - 40 IU/L 11   15   ALT 0 - 32 IU/L 13   13        Latest Ref Rng & Units 11/06/2021   11:24 AM 06/08/2019   12:49 PM 03/29/2019    1:11 PM  CBC  WBC 3.4 - 10.8 x10E3/uL 10.5  11.3  10.8   Hemoglobin 11.1 - 15.9 g/dL 05/27/2019  97.9  89.2   Hematocrit 34.0 - 46.6 % 42.8  41.2  41.2   Platelets 150 - 450 x10E3/uL 299  247  257     Urinalysis See Epic and HPI. I have reviewed the labs.   Pertinent Imaging: *** I have independently reviewed the films.  See HPI.   Assessment & Plan:    1. Left flank pain -CT renal study order STAT today- bilateral nephrolithiasis w/o hydro or ureteral stones -? Of pain secondary to ovarian cyst vs UTI -KUB with bilateral nephrolithiasis -UA w/ micro heme -urine culture  -start Septra empirically while awaiting cultures   2. High risk hematuria -CT Urogram with bilateral nephrolithiasis 08/2020 -cysto NED 10/2019 -reports of gross  heme -UA 11-30 RBC's   3. Left ovarian cyst -CT renal stone study (10/2021) - Interval decrease in size of cystic lesion associated with the LEFT adnexa. Residual complex measures 3.9 by 2.6 cm with a central round density measuring 15 mm.  Previously there was a 4 cm cyst associated with the LEFT ovary. RIGHT ovary not identified  -refer to gynecology   No follow-ups on file.  These notes generated with voice recognition software. I apologize for typographical errors.  11/2021  [facility] [address]  [address] [place], [place] [phone]

## 2021-11-22 ENCOUNTER — Ambulatory Visit: Payer: BC Managed Care – PPO | Admitting: Urology

## 2021-11-22 DIAGNOSIS — N200 Calculus of kidney: Secondary | ICD-10-CM

## 2021-11-22 DIAGNOSIS — R319 Hematuria, unspecified: Secondary | ICD-10-CM

## 2021-12-03 ENCOUNTER — Telehealth: Payer: Self-pay | Admitting: Urology

## 2021-12-03 NOTE — Telephone Encounter (Signed)
Since her pelvic ultrasound has shown resolution of her ovarian cyst, I would like to check her urine again in three months to make sure the hematuria has resolved as well.

## 2021-12-04 NOTE — Telephone Encounter (Signed)
LMOM for pt to return call to set up lab appt for UA drop off.

## 2021-12-07 DIAGNOSIS — M25551 Pain in right hip: Secondary | ICD-10-CM | POA: Diagnosis not present

## 2021-12-07 DIAGNOSIS — G894 Chronic pain syndrome: Secondary | ICD-10-CM | POA: Diagnosis not present

## 2021-12-07 DIAGNOSIS — M25552 Pain in left hip: Secondary | ICD-10-CM | POA: Diagnosis not present

## 2021-12-07 DIAGNOSIS — M47817 Spondylosis without myelopathy or radiculopathy, lumbosacral region: Secondary | ICD-10-CM | POA: Diagnosis not present

## 2021-12-07 DIAGNOSIS — H11441 Conjunctival cysts, right eye: Secondary | ICD-10-CM | POA: Diagnosis not present

## 2021-12-10 ENCOUNTER — Telehealth: Payer: Self-pay

## 2021-12-10 NOTE — Telephone Encounter (Signed)
LMOM for pt to return call to set up appt. Sending mychart msg.

## 2021-12-10 NOTE — Telephone Encounter (Signed)
-----   Message from Nori Riis, PA-C sent at 12/08/2021  7:06 PM EDT ----- Would you have [name] come in for a recheck on her urine now that she has had her pelvic ultrasound and it shows that the ovarian cyst has resolved?  We need to make sure the microscopic blood in the urine has resolved.

## 2021-12-12 ENCOUNTER — Other Ambulatory Visit: Payer: Self-pay

## 2021-12-12 DIAGNOSIS — R3129 Other microscopic hematuria: Secondary | ICD-10-CM

## 2021-12-13 ENCOUNTER — Other Ambulatory Visit: Payer: BC Managed Care – PPO

## 2021-12-13 DIAGNOSIS — R3129 Other microscopic hematuria: Secondary | ICD-10-CM

## 2021-12-13 LAB — MICROSCOPIC EXAMINATION

## 2021-12-13 LAB — URINALYSIS, COMPLETE
Bilirubin, UA: NEGATIVE
Glucose, UA: NEGATIVE
Ketones, UA: NEGATIVE
Leukocytes,UA: NEGATIVE
Nitrite, UA: NEGATIVE
Specific Gravity, UA: 1.02 (ref 1.005–1.030)
Urobilinogen, Ur: 0.2 mg/dL (ref 0.2–1.0)
pH, UA: 7 (ref 5.0–7.5)

## 2021-12-14 ENCOUNTER — Telehealth: Payer: Self-pay | Admitting: Family Medicine

## 2021-12-14 NOTE — Telephone Encounter (Signed)
Patient notified and voiced understanding, appointment has been scheduled.

## 2021-12-14 NOTE — Telephone Encounter (Signed)
-----   Message from Nori Riis, PA-C sent at 12/13/2021  5:25 PM EDT ----- Please let [name] know that she still had microscopic hematuria in her urine, but this seems to be a common occurrence with her at this time.  Right now we will continue to monitor.  I like to see her in 6 months with KUB and repeat UA.

## 2021-12-25 NOTE — Progress Notes (Unsigned)
I,Sha'taria Tyson,acting as a Education administrator for [employer], PA-C.,have documented all relevant documentation on the behalf of Mikey Kirschner, PA-C,as directed by  Mikey Kirschner, PA-C while in the presence of Mikey Kirschner, PA-C.   Complete physical exam   Patient: [name]   DOB: 1980/10/07   41 y.o. Female  MRN: [id number] Visit Date: 12/26/2021  Today's healthcare provider: Mikey Kirschner, PA-C   No chief complaint on file.  Subjective    [name] is a 41 y.o. female who presents today for a complete physical exam.  She reports consuming a {diet types:17450} diet. {Exercise:19826} She generally feels {well/fairly well/poorly:18703}. She reports sleeping {well/fairly well/poorly:18703}. She {does/does not:200015} have additional problems to discuss today.  HPI  ***  Past Medical History:  Diagnosis Date   Acute pyelonephritis 06/29/2012   Anxiety    Arthritis    Asthma    AS A CHILD   Complication of anesthesia    TROUBLE URINATING IN THE PAST   Ehlers-Danlos syndrome    Gallstones    GERD (gastroesophageal reflux disease)    History of kidney stones    Kidney stones    bilateral   Migraine    Pelvic pain in female    PONV (postoperative nausea and vomiting)    WITH HYSTERECTOMY ONLY   Pyelonephritis 2015   Renal mass 2017   right. considered a normal cyst on kidney.   Tobacco abuse    Past Surgical History:  Procedure Laterality Date   ABDOMINAL HYSTERECTOMY  2006   ANKLE ARTHROSCOPY Left 2017   ANKLE ARTHROSCOPY Left 11/24/2015   APPENDECTOMY  4/0/98   CAST APPLICATION  11/91/4782   Procedure: CAST APPLICATION;  Surgeon: Johnn Hai, MD;  Location: [facility];  Service: Orthopedics;  Laterality: Left;   CHOLECYSTECTOMY  03/11/2011   Procedure: LAPAROSCOPIC CHOLECYSTECTOMY WITH INTRAOPERATIVE CHOLANGIOGRAM;  Surgeon: Gayland Curry, MD;  Location: [place];  Service: General;  Laterality: N/A;   CHOLECYSTECTOMY, LAPAROSCOPIC      CYSTOSCOPY/URETEROSCOPY/HOLMIUM LASER/STENT PLACEMENT Left 06/18/2016   Procedure: CYSTOSCOPY/URETEROSCOPY/HOLMIUM LASER/STENT PLACEMENT;  Surgeon: Hollice Espy, MD;  Location: [facility];  Service: Urology;  Laterality: Left;   LAPAROSCOPIC OVARIAN CYSTECTOMY Right 09/02/2017   Procedure: LAPAROSCOPIC OVARIAN CYSTECTOMY;  Surgeon: Gae Dry, MD;  Location: [facility];  Service: Gynecology;  Laterality: Right;   LITHOTRIPSY     x 4   ORIF ANKLE FRACTURE  12/25/2011   Procedure: OPEN REDUCTION INTERNAL FIXATION (ORIF) ANKLE FRACTURE;  Surgeon: Johnn Hai, MD;  Location: [facility];  Service: Orthopedics;  Laterality: Left;   TONSILLECTOMY     Social History   Socioeconomic History   Marital status: Married    Spouse name: Mia Creek   Number of children: Not on file   Years of education: Not on file   Highest education level: Not on file  Occupational History   Not on file  Tobacco Use   Smoking status: Former    Packs/day: 0.50    Years: 15.00    Total pack years: 7.50    Types: Cigarettes    Start date: 11/03/1999    Quit date: 09/26/2014    Years since quitting: 7.2   Smokeless tobacco: Never  Vaping Use   Vaping Use: Never used  Substance and Sexual Activity   Alcohol use: Not Currently    Alcohol/week: 0.0 standard drinks of alcohol   Drug use: No   Sexual activity: Yes    Birth control/protection: Surgical  Comment: Hysterectomy   Other Topics Concern   Not on file  Social History Narrative   Not on file   Social Determinants of Health   Financial Resource Strain: Not on file  Food Insecurity: Not on file  Transportation Needs: Not on file  Physical Activity: Not on file  Stress: Not on file  Social Connections: Not on file  Intimate Partner Violence: Not on file   Family Status  Relation Name Status   Mother  Deceased   Father  Deceased   Sister  Alive   Brother  Alive   Brother  Alive   Brother  Alive   Neg Hx  (Not Specified)   Family History   Problem Relation Age of Onset   Diabetes Mother    Diabetes Father    Heart failure Father    Hypertension Father    Pulmonary fibrosis Father        Secondary to amiodarone   Nephrolithiasis Father    Arrhythmia Father        Suspected ventricular tachycardia/fibrillation   Other Brother        Hypermobile joints   Kidney cancer Neg Hx    Bladder Cancer Neg Hx    Allergies  Allergen Reactions   Compazine Other (See Comments)    Dystonia   Metoclopramide Other (See Comments)    dystonia    Patient Care Team: Alfredia Ferguson, PA-C as PCP - General (Physician Assistant) Alba Cory, MD as Attending Physician (Family Medicine) Earline Mayotte, MD (General Surgery)   Medications: Outpatient Medications Prior to Visit  Medication Sig   gabapentin (NEURONTIN) 800 MG tablet Take 800 mg by mouth 4 (four) times daily.   methylphenidate (RITALIN) 5 MG tablet Take 5 mg by mouth daily.   Multiple Vitamin (MULTIVITAMIN WITH MINERALS) TABS tablet Take 1 tablet by mouth daily.   omeprazole (PRILOSEC) 20 MG capsule TAKE 1 CAPSULE BY MOUTH ONCE DAILY   Oxycodone HCl 10 MG TABS Take by mouth.   polyethylene glycol (MIRALAX / GLYCOLAX) packet Take 17 g by mouth every other day.    rizatriptan (MAXALT) 10 MG tablet TAKE 1 TABLET AS NEEDED FOR MIGRAINE. MAY REPEAT IN TWO HOURS IF NEEDED   sulfamethoxazole-trimethoprim (BACTRIM DS) 800-160 MG tablet Take 1 tablet by mouth every 12 (twelve) hours.   valACYclovir (VALTREX) 500 MG tablet Take 2 tablets (1,000 mg total) by mouth 2 (two) times daily as needed (cold sores).   Vitamin D, Ergocalciferol, (DRISDOL) 1.25 MG (50000 UNIT) CAPS capsule TAKE 1 CAPSULE (50,000 UNITS) BY MOUTH EVERY 7 DAYS   No facility-administered medications prior to visit.    Review of Systems  {Labs  Heme  Chem  Endocrine  Serology  Results Review (optional):23779}  Objective    There were no vitals taken for this visit. {Show previous vital signs  (optional):23777}   Physical Exam  ***  Last depression screening scores    11/06/2021   10:47 AM 10/22/2021    1:14 PM 05/08/2020    9:58 AM  PHQ 2/9 Scores  PHQ - 2 Score 1 1 2   PHQ- 9 Score 3 3 6    Last fall risk screening    11/06/2021   10:47 AM  Fall Risk   Falls in the past year? 0  Number falls in past yr: 0  Injury with Fall? 0  Risk for fall due to : No Fall Risks   Last Audit-C alcohol use screening    11/06/2021  10:47 AM  Alcohol Use Disorder Test (AUDIT)  1. How often do you have a drink containing alcohol? 1  2. How many drinks containing alcohol do you have on a typical day when you are drinking? 0  3. How often do you have six or more drinks on one occasion? 0  AUDIT-C Score 1   A score of 3 or more in women, and 4 or more in men indicates increased risk for alcohol abuse, EXCEPT if all of the points are from question 1   No results found for any visits on 12/26/21.  Assessment & Plan    Routine Health Maintenance and Physical Exam  Exercise Activities and Dietary recommendations  Goals   None     Immunization History  Administered Date(s) Administered   Influenza-Unspecified 01/03/2015, 10/14/2017   Tdap 03/22/2011    Health Maintenance  Topic Date Due   COVID-19 Vaccine (1) Never done   Hepatitis C Screening  Never done   MAMMOGRAM  08/18/2019   TETANUS/TDAP  03/21/2021   INFLUENZA VACCINE  05/26/2022 (Originally 09/25/2021)   HIV Screening  Completed   HPV VACCINES  Aged Out   PAP SMEAR-Modifier  Discontinued    Discussed health benefits of physical activity, and encouraged her to engage in regular exercise appropriate for her age and condition.  ***  No follow-ups on file.     {provider attestation***:1}   Alfredia Ferguson, PA-C  [facility] [phone] (phone) [phone] (fax)  [facility]

## 2021-12-26 ENCOUNTER — Encounter: Payer: Self-pay | Admitting: Physician Assistant

## 2021-12-26 ENCOUNTER — Ambulatory Visit (INDEPENDENT_AMBULATORY_CARE_PROVIDER_SITE_OTHER): Payer: BC Managed Care – PPO | Admitting: Physician Assistant

## 2021-12-26 VITALS — Ht 67.0 in | Wt 234.5 lb

## 2021-12-26 DIAGNOSIS — E785 Hyperlipidemia, unspecified: Secondary | ICD-10-CM

## 2021-12-26 DIAGNOSIS — R1310 Dysphagia, unspecified: Secondary | ICD-10-CM | POA: Diagnosis not present

## 2021-12-26 DIAGNOSIS — K5909 Other constipation: Secondary | ICD-10-CM

## 2021-12-26 DIAGNOSIS — Z23 Encounter for immunization: Secondary | ICD-10-CM | POA: Diagnosis not present

## 2021-12-26 DIAGNOSIS — Q796 Ehlers-Danlos syndrome, unspecified: Secondary | ICD-10-CM

## 2021-12-26 DIAGNOSIS — Z0000 Encounter for general adult medical examination without abnormal findings: Secondary | ICD-10-CM

## 2021-12-26 DIAGNOSIS — E559 Vitamin D deficiency, unspecified: Secondary | ICD-10-CM

## 2021-12-26 DIAGNOSIS — Z1231 Encounter for screening mammogram for malignant neoplasm of breast: Secondary | ICD-10-CM

## 2021-12-26 MED ORDER — VITAMIN D (ERGOCALCIFEROL) 1.25 MG (50000 UNIT) PO CAPS: ORAL_CAPSULE | ORAL | 3 refills | Status: DC

## 2021-12-26 NOTE — Patient Instructions (Signed)
Please contact [phone] to schedule your mammogram. They will ask which location you prefer to be seen in. You have two options listed below.  1) [facility] located at [address] [place], [place] [address] 2) [facility] [place] located at [address] [place], [place] [address]  Please feel free to contact us if you have any further questions or concerns  

## 2021-12-26 NOTE — Assessment & Plan Note (Signed)
Ref to gyn w/ other concerns of dysphagia, and h/o EDS

## 2021-12-26 NOTE — Assessment & Plan Note (Signed)
Reviewed recently labs, low level HLD, advised diet/exercise. The 10-year ASCVD risk score (Arnett DK, et al., 2019) is: 4.4%

## 2022-01-10 DIAGNOSIS — M533 Sacrococcygeal disorders, not elsewhere classified: Secondary | ICD-10-CM | POA: Diagnosis not present

## 2022-01-10 DIAGNOSIS — G894 Chronic pain syndrome: Secondary | ICD-10-CM | POA: Diagnosis not present

## 2022-01-10 DIAGNOSIS — M47817 Spondylosis without myelopathy or radiculopathy, lumbosacral region: Secondary | ICD-10-CM | POA: Diagnosis not present

## 2022-02-13 DIAGNOSIS — M25572 Pain in left ankle and joints of left foot: Secondary | ICD-10-CM | POA: Diagnosis not present

## 2022-02-13 DIAGNOSIS — M47817 Spondylosis without myelopathy or radiculopathy, lumbosacral region: Secondary | ICD-10-CM | POA: Diagnosis not present

## 2022-02-13 DIAGNOSIS — M5416 Radiculopathy, lumbar region: Secondary | ICD-10-CM | POA: Diagnosis not present

## 2022-02-13 DIAGNOSIS — G894 Chronic pain syndrome: Secondary | ICD-10-CM | POA: Diagnosis not present

## 2022-02-27 ENCOUNTER — Ambulatory Visit
Admission: RE | Admit: 2022-02-27 | Discharge: 2022-02-27 | Disposition: A | Discharge status: Home / Self Care | Payer: BC Managed Care – PPO | Source: Ambulatory Visit | Attending: Physician Assistant | Admitting: Physician Assistant

## 2022-02-27 DIAGNOSIS — Z1231 Encounter for screening mammogram for malignant neoplasm of breast: Secondary | ICD-10-CM | POA: Insufficient documentation

## 2022-04-18 ENCOUNTER — Encounter: Payer: Self-pay | Admitting: Physician Assistant

## 2022-04-18 ENCOUNTER — Other Ambulatory Visit: Payer: Self-pay | Admitting: Physician Assistant

## 2022-04-18 DIAGNOSIS — F419 Anxiety disorder, unspecified: Secondary | ICD-10-CM

## 2022-04-18 MED ORDER — ALPRAZOLAM 0.5 MG PO TABS: 0.5000 mg | ORAL_TABLET | Freq: Two times a day (BID) | ORAL | 3 refills | Status: AC | PRN

## 2022-04-18 NOTE — Telephone Encounter (Signed)
Please advise 

## 2022-04-29 ENCOUNTER — Other Ambulatory Visit: Payer: Self-pay

## 2022-04-29 ENCOUNTER — Encounter: Payer: Self-pay | Admitting: Physician Assistant

## 2022-04-29 ENCOUNTER — Ambulatory Visit (INDEPENDENT_AMBULATORY_CARE_PROVIDER_SITE_OTHER): Payer: 59 | Admitting: Gastroenterology

## 2022-04-29 ENCOUNTER — Encounter: Payer: Self-pay | Admitting: Gastroenterology

## 2022-04-29 VITALS — BP 136/88 | HR 80 | Temp 98.7°F | Ht 67.0 in | Wt 236.0 lb

## 2022-04-29 DIAGNOSIS — R1319 Other dysphagia: Secondary | ICD-10-CM | POA: Diagnosis not present

## 2022-04-29 MED ORDER — OMEPRAZOLE 40 MG PO CPDR: 40.0000 mg | DELAYED_RELEASE_CAPSULE | Freq: Every day | ORAL | 3 refills | Status: DC

## 2022-04-29 NOTE — Patient Instructions (Addendum)
We will put you on our recall list. Therefore, we will send you a letter to remind you to give [place] a call to schedule a follow up appointment with Dr. Vicente Males.  Please contact Specialty Scheduling at [phone] to schedule your modified barium swallow. To prep for this, please eat a regular breakfast and a light lunch.  Please purchase a wedge pillow to help you with your gastroesophageal reflux disease.    Food Choices for Gastroesophageal Reflux Disease, Adult When you have gastroesophageal reflux disease (GERD), the foods you eat and your eating habits are very important. Choosing the right foods can help ease your discomfort. Think about working with a food expert (dietitian) to help you make good choices. What are tips for following this plan? Reading food labels Look for foods that are low in saturated fat. Foods that may help with your symptoms include: Foods that have less than 5% of daily value (DV) of fat. Foods that have 0 grams of trans fat. Cooking Do not fry your food. Cook your food by baking, steaming, grilling, or broiling. These are all methods that do not need a lot of fat for cooking. To add flavor, try to use herbs that are low in spice and acidity. Meal planning  Choose healthy foods that are low in fat, such as: Fruits and vegetables. Whole grains. Low-fat dairy products. Lean meats, fish, and poultry. Eat small meals often instead of eating 3 large meals each day. Eat your meals slowly in a place where you are relaxed. Avoid bending over or lying down until 2-3 hours after eating. Limit high-fat foods such as fatty meats or fried foods. Limit your intake of fatty foods, such as oils, butter, and shortening. Avoid the following as told by your doctor: Foods that cause symptoms. These may be different for different people. Keep a food diary to keep track of foods that cause symptoms. Alcohol. Drinking a lot of liquid with meals. Eating meals during the 2-3 hours  before bed. Lifestyle Stay at a healthy weight. Ask your doctor what weight is healthy for you. If you need to lose weight, work with your doctor to do so safely. Exercise for at least 30 minutes on 5 or more days each week, or as told by your doctor. Wear loose-fitting clothes. Do not smoke or use any products that contain nicotine or tobacco. If you need help quitting, ask your doctor. Sleep with the head of your bed higher than your feet. Use a wedge under the mattress or blocks under the bed frame to raise the head of the bed. Chew sugar-free gum after meals. What foods should eat?  Eat a healthy, well-balanced diet of fruits, vegetables, whole grains, low-fat dairy products, lean meats, fish, and poultry. Each person is different. Foods that may cause symptoms in one person may not cause any symptoms in another person. Work with your doctor to find foods that are safe for you. The items listed above may not be a complete list of what you can eat and drink. Contact a food expert for more options. What foods should I avoid? Limiting some of these foods may help in managing the symptoms of GERD. Everyone is different. Talk with a food expert or your doctor to help you find the exact foods to avoid, if any. Fruits Any fruits prepared with added fat. Any fruits that cause symptoms. For some people, this may include citrus fruits, such as oranges, grapefruit, pineapple, and lemons. Vegetables Deep-fried vegetables. [place] fries.  Any vegetables prepared with added fat. Any vegetables that cause symptoms. For some people, this may include tomatoes and tomato products, chili peppers, onions and garlic, and horseradish. Grains Pastries or quick breads with added fat. Meats and other proteins High-fat meats, such as fatty beef or pork, hot dogs, ribs, ham, sausage, salami, and bacon. Fried meat or protein, including fried fish and fried chicken. Nuts and nut butters, in large amounts. Dairy Whole  milk and chocolate milk. Sour cream. Cream. Ice cream. Cream cheese. Milkshakes. Fats and oils Butter. Margarine. Shortening. Ghee. Beverages Coffee and tea, with or without caffeine. Carbonated beverages. Sodas. Energy drinks. Fruit juice made with acidic fruits, such as orange or grapefruit. Tomato juice. Alcoholic drinks. Sweets and desserts Chocolate and cocoa. Donuts. Seasonings and condiments Pepper. Peppermint and spearmint. Added salt. Any condiments, herbs, or seasonings that cause symptoms. For some people, this may include curry, hot sauce, or vinegar-based salad dressings. The items listed above may not be a complete list of what you should not eat and drink. Contact a food expert for more options. Questions to ask your doctor Diet and lifestyle changes are often the first steps that are taken to manage symptoms of GERD. If diet and lifestyle changes do not help, talk with your doctor about taking medicines. Where to find more information International Foundation for Gastrointestinal Disorders: aboutgerd.org Summary When you have GERD, food and lifestyle choices are very important in easing your symptoms. Eat small meals often instead of 3 large meals a day. Eat your meals slowly and in a place where you are relaxed. Avoid bending over or lying down until 2-3 hours after eating. Limit high-fat foods such as fatty meats or fried foods. This information is not intended to replace advice given to you by your health care provider. Make sure you discuss any questions you have with your health care provider. Document Revised: 08/23/2019 Document Reviewed: 08/23/2019 Elsevier Patient Education  [place].

## 2022-04-29 NOTE — Progress Notes (Signed)
Sophia Bellows MD, MRCP(U.K) [address]  [place]  [place], [place] [address]  Main: [phone]  Fax: [phone]   Gastroenterology Consultation  Referring Provider:     Mikey Kirschner, PA-C Primary Care Physician:  Sophia Kirschner, PA-C Primary Gastroenterologist:  Dr. Jonathon Hall  Reason for Consultation:    Dysphagia        HPI:   [name] is a 42 y.o. y/o female referred for consultation & management  by . Sophia Kirschner, PA-C.     She has a history of Erler Danlos syndrome and presents to me with complaints of difficulty swallowing..  She states that she was recently diagnosed with Ehlers-Danlos syndrome and she has always had difficulty swallowing since many years but is gradually got worse can affect liquids as well as solids.  She may regurgitate at times and she may cough during meals at times.  She also complains of early satiety and feels full very easily does complain of heartburn on Prilosec 20 mg a day with symptoms not very well-controlled.  She also complains of chronic constipation which she takes MiraLAX on and off.  Denies any weight loss. Past Medical History:  Diagnosis Date   Acute pyelonephritis 06/29/2012   Anxiety    Arthritis    Asthma    AS A CHILD   Complication of anesthesia    TROUBLE URINATING IN THE PAST   Ehlers-Danlos syndrome    Gallstones    GERD (gastroesophageal reflux disease)    History of kidney stones    Kidney stones    bilateral   Migraine    Pelvic pain in female    PONV (postoperative nausea and vomiting)    WITH HYSTERECTOMY ONLY   Pyelonephritis 2015   Renal mass 2017   right. considered a normal cyst on kidney.   Tobacco abuse     Past Surgical History:  Procedure Laterality Date   ABDOMINAL HYSTERECTOMY  2006   ANKLE ARTHROSCOPY Left 2017   ANKLE ARTHROSCOPY Left 11/24/2015   APPENDECTOMY  [id number]   CAST APPLICATION  [id number]   Procedure: CAST APPLICATION;  Surgeon: Johnn Hai, MD;   Location: [facility];  Service: Orthopedics;  Laterality: Left;   CHOLECYSTECTOMY  03/11/2011   Procedure: LAPAROSCOPIC CHOLECYSTECTOMY WITH INTRAOPERATIVE CHOLANGIOGRAM;  Surgeon: Gayland Curry, MD;  Location: [facility];  Service: General;  Laterality: N/A;   CHOLECYSTECTOMY, LAPAROSCOPIC     CYSTOSCOPY/URETEROSCOPY/HOLMIUM LASER/STENT PLACEMENT Left 06/18/2016   Procedure: CYSTOSCOPY/URETEROSCOPY/HOLMIUM LASER/STENT PLACEMENT;  Surgeon: Hollice Espy, MD;  Location: [facility];  Service: Urology;  Laterality: Left;   LAPAROSCOPIC OVARIAN CYSTECTOMY Right 09/02/2017   Procedure: LAPAROSCOPIC OVARIAN CYSTECTOMY;  Surgeon: Gae Dry, MD;  Location: [facility];  Service: Gynecology;  Laterality: Right;   LITHOTRIPSY     x 4   ORIF ANKLE FRACTURE  12/25/2011   Procedure: OPEN REDUCTION INTERNAL FIXATION (ORIF) ANKLE FRACTURE;  Surgeon: Johnn Hai, MD;  Location: [facility];  Service: Orthopedics;  Laterality: Left;   TONSILLECTOMY      Prior to Admission medications   Medication Sig Start Date End Date Taking? Authorizing Provider  ALPRAZolam Duanne Moron) 0.5 MG tablet Take 1 tablet (0.5 mg total) by mouth 2 (two) times daily as needed for anxiety. 04/18/22 05/18/22  Sophia Kirschner, PA-C  baclofen (LIORESAL) 20 MG tablet Take 20 mg by mouth 3 (three) times daily. 12/13/21   [provider]  gabapentin (NEURONTIN) 800 MG tablet Take 800 mg by mouth 4 (  four) times daily. 05/04/20   [provider]  methylphenidate (RITALIN) 5 MG tablet Take 5 mg by mouth daily.    [provider]  Multiple Vitamin (MULTIVITAMIN WITH MINERALS) TABS tablet Take 1 tablet by mouth daily.    [provider]  omeprazole (PRILOSEC) 20 MG capsule TAKE 1 CAPSULE BY MOUTH ONCE DAILY 07/20/19   Mar Daring, PA-C  Oxycodone HCl 20 MG TABS Take 1 tablet by mouth 4 (four) times daily as needed. 02/06/22   [provider]  polyethylene glycol (MIRALAX / GLYCOLAX) packet Take 17 g by mouth  every other day.     [provider]  rizatriptan (MAXALT) 10 MG tablet TAKE 1 TABLET AS NEEDED FOR MIGRAINE. MAY REPEAT IN TWO HOURS IF NEEDED 05/17/21   Drubel, Ria Comment, PA-C  valACYclovir (VALTREX) 500 MG tablet Take 2 tablets (1,000 mg total) by mouth 2 (two) times daily as needed (cold sores). 03/11/18   Mar Daring, PA-C  Vitamin D, Ergocalciferol, (DRISDOL) 1.25 MG (50000 UNIT) CAPS capsule TAKE 1 CAPSULE (50,000 UNITS) BY MOUTH EVERY 7 DAYS 12/26/21   Sophia Kirschner, PA-C    Family History  Problem Relation Age of Onset   Diabetes Mother    Diabetes Father    Heart failure Father    Hypertension Father    Pulmonary fibrosis Father        Secondary to amiodarone   Nephrolithiasis Father    Arrhythmia Father        Suspected ventricular tachycardia/fibrillation   Other Brother        Hypermobile joints   Kidney cancer Neg Hx    Bladder Cancer Neg Hx      Social History   Tobacco Use   Smoking status: Every Day    Packs/day: 0.50    Years: 15.00    Total pack years: 7.50    Types: Cigarettes    Start date: 11/03/1999    Last attempt to quit: 09/26/2014    Years since quitting: 7.5   Smokeless tobacco: Never   Tobacco comments:    Patient started smoking again in the past year  Vaping Use   Vaping Use: Never used  Substance Use Topics   Alcohol use: Not Currently    Alcohol/week: 0.0 standard drinks of alcohol   Drug use: No    Allergies as of 04/29/2022 - Review Complete 04/29/2022  Allergen Reaction Noted   Compazine Other (See Comments) 01/19/2011   Metoclopramide Other (See Comments)     Review of Systems:    All systems reviewed and negative except where noted in HPI.   Physical Exam:  BP (!) 128/94   Pulse (!) 103   Temp 98.7 F (37.1 C) (Oral)   Ht '5\' 7"'$  (1.702 m)   Wt 236 lb (107 kg)   BMI 36.96 kg/m  No LMP recorded. Patient has had a hysterectomy. Psych:  Alert and cooperative. Normal mood and affect. General:   Alert,   Well-developed, well-nourished, pleasant and cooperative in NAD Head:  Normocephalic and atraumatic. Eyes:  Sclera clear, no icterus.   Conjunctiva pink. Ears:  Normal auditory acuity. Neurologic:  Alert and oriented x3;  grossly normal neurologically. Psych:  Alert and cooperative. Normal mood and affect.  Imaging Studies: No results found.  Assessment and Plan:   NAHIMA Hall is a 42 y.o. y/o female has been referred for dysphagia and recent diagnosis of Ehlers-Danlos syndrome.  She has symptoms suggestive of reflux as well as  transfer dysphagia.  I explained to her that Ehlers-Danlos syndrome can affect the collagen and may affect articulation and coordination of movements which affect the process of swallowing.  It can affect the entire GI tract may cause gastroparesis which in turn may cause early satiety.  I also do believe that she may have acid reflux as part of the problem or is a secondary issue.  Plan 1.  Modified barium swallow to rule out transfer dysphagia 2.  Barium swallow with tablet to evaluate the esophagus 3.  EGD to evaluate for early satiety and take biopsies to rule out eosinophilic esophagitis. 4.  Commence on Prilosec 40 mg once a day to treat any reflux, GERD patient information provided, discussed about the use of a wedge pillow timing of meals.  If no better with the above interventions will obtain esophageal manometric pH studies. 5.  I also suggested that she follow-up with a specialist with Ehlers-Danlos syndrome at [facility] as it has various other associations.   I have discussed alternative options, risks & benefits,  which include, but are not limited to, bleeding, infection, perforation,respiratory complication & drug reaction.  The patient agrees with this plan & written consent will be obtained.     Follow up in 3-4 months   Dr Sophia Bellows MD,MRCP(U.K)

## 2022-05-02 ENCOUNTER — Other Ambulatory Visit: Payer: Self-pay | Admitting: Physician Assistant

## 2022-05-02 DIAGNOSIS — R1310 Dysphagia, unspecified: Secondary | ICD-10-CM

## 2022-05-02 DIAGNOSIS — Q796 Ehlers-Danlos syndrome, unspecified: Secondary | ICD-10-CM

## 2022-05-02 DIAGNOSIS — G43009 Migraine without aura, not intractable, without status migrainosus: Secondary | ICD-10-CM

## 2022-05-03 ENCOUNTER — Other Ambulatory Visit: Payer: Self-pay | Admitting: Physician Assistant

## 2022-05-03 DIAGNOSIS — G43009 Migraine without aura, not intractable, without status migrainosus: Secondary | ICD-10-CM

## 2022-05-06 ENCOUNTER — Ambulatory Visit
Admission: RE | Admit: 2022-05-06 | Discharge: 2022-05-06 | Disposition: A | Discharge status: Home / Self Care | Payer: 59 | Source: Ambulatory Visit | Attending: Gastroenterology | Admitting: Gastroenterology

## 2022-05-06 DIAGNOSIS — R1319 Other dysphagia: Secondary | ICD-10-CM | POA: Diagnosis present

## 2022-05-13 ENCOUNTER — Ambulatory Visit
Admission: RE | Admit: 2022-05-13 | Discharge: 2022-05-13 | Disposition: A | Discharge status: Home / Self Care | Payer: 59 | Source: Ambulatory Visit | Attending: Gastroenterology | Admitting: Gastroenterology

## 2022-05-13 DIAGNOSIS — R1319 Other dysphagia: Secondary | ICD-10-CM

## 2022-05-13 NOTE — Progress Notes (Signed)
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

## 2022-05-13 NOTE — Progress Notes (Signed)
Modified Barium Swallow Study  Patient Details  Name: [name] MRN: [id number] Date of Birth: 02-07-1981  Today's Date: 05/13/2022  Modified Barium Swallow completed.  Full report located under Chart Review in the Imaging Section.  History of Present Illness Patient is a 42 y.o female referred by MD for MBS due to patient complaints of difficulty swallowing, "rule out transfer dysphagia". PMH includes recent diagnosis of Ehlers-Danlos Syndrome, GERD, anxiety, and arthritis. Per 04/29/2022 gastroenterology progress report, patient reports difficulty swallowing for many years with gradual worsening, can affect liquids as well as solids. Reports regurgitation and cough during meals at times. No weight loss or recent history of pneumonia. Barium Swallow on 05/06/2022 showed: 1. Narrowed gastroesophageal junction, through which a 13 mm barium tablet would not pass. 2. Esophageal dysmotility. 3. Small volume gastroesophageal reflux.   Clinical Impression Patient presents with functional oropharyngeal swallowing. Oral stage is characterized by adequate lingual control and transfer, though brief hesitation is noted with thicker consistencies which is felt to be anticipatory of reduced esophageal clearance given that it is inconsistent and does not occur with any observable weakness. Tongue movement during initiation is brisk, with trace lingual coating of residue with thicker consistencies cleared with second swallow. Pharyngeal stage is noted for adequate hyolaryngeal excursion, epiglottic deflection, pharyngeal stripping wave and tongue base retraction, with no abnormal residue remaining in the pharynx. There is no laryngeal penetration or aspiration. Retention is noted in the lower cervical-middle esophagus with thicker consistencies in the lateral projection. When viewed in anterior-posterior view, an esophageal sweep revealed standing barium throughout the middle and distal esophagus. With barium  tablet, pt was noted to tilt into head extension vs lingually transferring tablet to pharynx. Recommend pt continue current diet with esophageal precautions, no further skilled ST needs identified. Factors that may increase risk of adverse event in presence of aspiration ([place] 2021): Respiratory or GI disease  Swallow Evaluation Recommendations Recommendations: PO diet PO Diet Recommendation: Regular;Thin liquids (Level 0) Liquid Administration via: Cup;Straw Medication Administration: Whole meds with liquid (can do whole in puree to aid transit and follow with liquids) Supervision: Patient able to self-feed Swallowing strategies  : Slow rate;Small bites/sips Postural changes: Position pt fully upright for meals;Stay upright 30-60 min after meals Oral care recommendations: Oral care BID (2x/day)     Deneise Lever, [place], [place] Speech-Language Pathologist [phone]  Aliene Altes 05/13/2022,6:06 PM

## 2022-05-15 ENCOUNTER — Ambulatory Visit
Admission: RE | Admit: 2022-05-15 | Discharge: 2022-05-15 | Disposition: A | Discharge status: Home / Self Care | Payer: 59 | Attending: Gastroenterology | Admitting: Gastroenterology

## 2022-05-15 ENCOUNTER — Ambulatory Visit: Payer: 59 | Admitting: Anesthesiology

## 2022-05-15 ENCOUNTER — Encounter: Payer: Self-pay | Admitting: Gastroenterology

## 2022-05-15 ENCOUNTER — Encounter
Admission: RE | Disposition: A | Discharge status: Home / Self Care | Payer: Self-pay | Source: Home / Self Care | Attending: Gastroenterology

## 2022-05-15 DIAGNOSIS — F17210 Nicotine dependence, cigarettes, uncomplicated: Secondary | ICD-10-CM | POA: Diagnosis not present

## 2022-05-15 DIAGNOSIS — K224 Dyskinesia of esophagus: Secondary | ICD-10-CM | POA: Insufficient documentation

## 2022-05-15 DIAGNOSIS — R1319 Other dysphagia: Secondary | ICD-10-CM | POA: Diagnosis not present

## 2022-05-15 DIAGNOSIS — J45909 Unspecified asthma, uncomplicated: Secondary | ICD-10-CM | POA: Diagnosis not present

## 2022-05-15 DIAGNOSIS — K2100 Gastro-esophageal reflux disease with esophagitis, without bleeding: Secondary | ICD-10-CM | POA: Insufficient documentation

## 2022-05-15 HISTORY — PX: ESOPHAGOGASTRODUODENOSCOPY (EGD) WITH PROPOFOL: SHX5813

## 2022-05-15 SURGERY — ESOPHAGOGASTRODUODENOSCOPY (EGD) WITH PROPOFOL
Anesthesia: General

## 2022-05-15 MED ORDER — LIDOCAINE HCL (CARDIAC) PF 100 MG/5ML IV SOSY
PREFILLED_SYRINGE | INTRAVENOUS | Status: DC | PRN
  Administered 2022-05-15: 50 mg via INTRAVENOUS

## 2022-05-15 MED ORDER — SODIUM CHLORIDE 0.9 % IV SOLN: INTRAVENOUS | Status: DC

## 2022-05-15 MED ORDER — PROPOFOL 10 MG/ML IV BOLUS
INTRAVENOUS | Status: DC | PRN
  Administered 2022-05-15: 150 ug/kg/min via INTRAVENOUS
  Administered 2022-05-15: 40 mg via INTRAVENOUS

## 2022-05-15 MED ORDER — DEXMEDETOMIDINE HCL IN NACL 200 MCG/50ML IV SOLN
INTRAVENOUS | Status: DC | PRN
  Administered 2022-05-15: 4 ug via INTRAVENOUS

## 2022-05-15 NOTE — Anesthesia Preprocedure Evaluation (Signed)
Anesthesia Evaluation  Patient identified by MRN, date of birth, ID band Patient awake    Reviewed: Allergy & Precautions, H&P , NPO status , Patient's Chart, lab work & pertinent test results, reviewed documented beta blocker date and time   History of Anesthesia Complications (+) PONV and history of anesthetic complications  Airway Mallampati: I  TM Distance: >3 FB Neck ROM: full    Dental  (+) Dental Advidsory Given, Teeth Intact, Missing   Pulmonary neg shortness of breath, asthma (as a child) , neg recent URI, Current Smoker, former smoker          Cardiovascular Exercise Tolerance: Good negative cardio ROS      Neuro/Psych  PSYCHIATRIC DISORDERS Anxiety     negative neurological ROS     GI/Hepatic Neg liver ROS,GERD  ,,  Endo/Other  negative endocrine ROS    Renal/GU Renal disease (kidney stones)  negative genitourinary   Musculoskeletal   Abdominal   Peds  Hematology negative hematology ROS (+)   Anesthesia Other Findings Past Medical History: 06/29/2012: Acute pyelonephritis No date: Anxiety No date: Arthritis No date: Asthma     Comment:  AS A CHILD No date: Complication of anesthesia     Comment:  TROUBLE URINATING IN THE PAST No date: Ehlers-Danlos syndrome No date: Gallstones No date: GERD (gastroesophageal reflux disease) No date: History of kidney stones No date: Kidney stones     Comment:  bilateral No date: Migraine No date: Pelvic pain in female No date: PONV (postoperative nausea and vomiting)     Comment:  WITH HYSTERECTOMY ONLY 2015: Pyelonephritis 2017: Renal mass     Comment:  right. considered a normal cyst on kidney. No date: Tobacco abuse   Reproductive/Obstetrics negative OB ROS                             Anesthesia Physical Anesthesia Plan  ASA: 2  Anesthesia Plan: General   Post-op Pain Management:    Induction: Intravenous  PONV Risk  Score and Plan: 4 or greater and Propofol infusion, TIVA and Treatment may vary due to age or medical condition  Airway Management Planned: Natural Airway and Nasal Cannula  Additional Equipment:   Intra-op Plan:   Post-operative Plan:   Informed Consent: I have reviewed the patients History and Physical, chart, labs and discussed the procedure including the risks, benefits and alternatives for the proposed anesthesia with the patient or authorized representative who has indicated his/her understanding and acceptance.     Dental Advisory Given  Plan Discussed with: Anesthesiologist, CRNA and Surgeon  Anesthesia Plan Comments:         Anesthesia Quick Evaluation

## 2022-05-15 NOTE — Transfer of Care (Signed)
Immediate Anesthesia Transfer of Care Note  Patient: BRIER MELGAR  Procedure(s) Performed: ESOPHAGOGASTRODUODENOSCOPY (EGD) WITH PROPOFOL  Patient Location: PACU  Anesthesia Type:General  Level of Consciousness: drowsy  Airway & Oxygen Therapy: Patient Spontanous Breathing  Post-op Assessment: Report given to RN and Post -op Vital signs reviewed and stable  Post vital signs: stable  Last Vitals:  Vitals Value Taken Time  BP 103/83 05/15/22 1311  Temp 36.2 C 05/15/22 1251  Pulse 83 05/15/22 1313  Resp 22 05/15/22 1313  SpO2 97 % 05/15/22 1313  Vitals shown include unvalidated device data.  Last Pain:  Vitals:   05/15/22 1311  TempSrc:   PainSc: 0-No pain         Complications: There were no known notable events for this encounter.

## 2022-05-15 NOTE — H&P (Signed)
Jonathon Bellows, MD [address], [place], [place], [place], [address] [address], [place], [place], [place], [address] Phone: [phone]  Fax: [phone]  Primary Care Physician:  Mikey Kirschner, PA-C   Pre-Procedure History & Physical: HPI:  MEKHIA CASAL is a 42 y.o. female is here for an endoscopy    Past Medical History:  Diagnosis Date   Acute pyelonephritis 06/29/2012   Anxiety    Arthritis    Asthma    AS A CHILD   Complication of anesthesia    TROUBLE URINATING IN THE PAST   Ehlers-Danlos syndrome    Gallstones    GERD (gastroesophageal reflux disease)    History of kidney stones    Kidney stones    bilateral   Migraine    Pelvic pain in female    PONV (postoperative nausea and vomiting)    WITH HYSTERECTOMY ONLY   Pyelonephritis 2015   Renal mass 2017   right. considered a normal cyst on kidney.   Tobacco abuse     Past Surgical History:  Procedure Laterality Date   ABDOMINAL HYSTERECTOMY  2006   ANKLE ARTHROSCOPY Left 2017   ANKLE ARTHROSCOPY Left 11/24/2015   APPENDECTOMY  [id number]   CAST APPLICATION  [id number]   Procedure: CAST APPLICATION;  Surgeon: Johnn Hai, MD;  Location: [facility];  Service: Orthopedics;  Laterality: Left;   CHOLECYSTECTOMY  03/11/2011   Procedure: LAPAROSCOPIC CHOLECYSTECTOMY WITH INTRAOPERATIVE CHOLANGIOGRAM;  Surgeon: Gayland Curry, MD;  Location: [facility];  Service: General;  Laterality: N/A;   CHOLECYSTECTOMY, LAPAROSCOPIC     CYSTOSCOPY/URETEROSCOPY/HOLMIUM LASER/STENT PLACEMENT Left 06/18/2016   Procedure: CYSTOSCOPY/URETEROSCOPY/HOLMIUM LASER/STENT PLACEMENT;  Surgeon: Hollice Espy, MD;  Location: [facility];  Service: Urology;  Laterality: Left;   LAPAROSCOPIC OVARIAN CYSTECTOMY Right 09/02/2017   Procedure: LAPAROSCOPIC OVARIAN CYSTECTOMY;  Surgeon: Gae Dry, MD;  Location: [facility];  Service: Gynecology;  Laterality: Right;   LITHOTRIPSY     x 4   ORIF ANKLE FRACTURE  12/25/2011   Procedure: OPEN  REDUCTION INTERNAL FIXATION (ORIF) ANKLE FRACTURE;  Surgeon: Johnn Hai, MD;  Location: [facility];  Service: Orthopedics;  Laterality: Left;   TONSILLECTOMY      Prior to Admission medications   Medication Sig Start Date End Date Taking? Authorizing Provider  omeprazole (PRILOSEC) 40 MG capsule Take 1 capsule (40 mg total) by mouth daily. 04/29/22  Yes Jonathon Bellows, MD  Oxycodone HCl 20 MG TABS Take 1 tablet by mouth 4 (four) times daily as needed. 02/06/22  Yes [provider]  ALPRAZolam Duanne Moron) 0.5 MG tablet Take 1 tablet (0.5 mg total) by mouth 2 (two) times daily as needed for anxiety. 04/18/22 05/18/22  Mikey Kirschner, PA-C  baclofen (LIORESAL) 20 MG tablet Take 20 mg by mouth 3 (three) times daily. 12/13/21   [provider]  gabapentin (NEURONTIN) 800 MG tablet Take 800 mg by mouth 4 (four) times daily. 05/04/20   [provider]  methylphenidate (RITALIN) 5 MG tablet Take 5 mg by mouth daily.    [provider]  Multiple Vitamin (MULTIVITAMIN WITH MINERALS) TABS tablet Take 1 tablet by mouth daily.    [provider]  polyethylene glycol (MIRALAX / GLYCOLAX) packet Take 17 g by mouth every other day.     [provider]  rizatriptan (MAXALT) 10 MG tablet TAKE ONE TABLET AS NEEDED FOR MIGRAINE. MAY REPEAT IN TWO HOURS IF NEEDED 05/03/22   Thedore Mins, Ria Comment, PA-C  valACYclovir (VALTREX)  500 MG tablet Take 2 tablets (1,000 mg total) by mouth 2 (two) times daily as needed (cold sores). 03/11/18   Mar Daring, PA-C  Vitamin D, Ergocalciferol, (DRISDOL) 1.25 MG (50000 UNIT) CAPS capsule TAKE 1 CAPSULE (50,000 UNITS) BY MOUTH EVERY 7 DAYS 12/26/21   Mikey Kirschner, PA-C    Allergies as of 04/29/2022 - Review Complete 04/29/2022  Allergen Reaction Noted   Compazine Other (See Comments) 01/19/2011   Metoclopramide Other (See Comments)     Family History  Problem Relation Age of Onset   Diabetes Mother    Diabetes Father    Heart  failure Father    Hypertension Father    Pulmonary fibrosis Father        Secondary to amiodarone   Nephrolithiasis Father    Arrhythmia Father        Suspected ventricular tachycardia/fibrillation   Other Brother        Hypermobile joints   Kidney cancer Neg Hx    Bladder Cancer Neg Hx     Social History   Socioeconomic History   Marital status: Married    Spouse name: Mia Creek   Number of children: Not on file   Years of education: Not on file   Highest education level: Not on file  Occupational History   Not on file  Tobacco Use   Smoking status: Every Day    Packs/day: 0.50    Years: 15.00    Additional pack years: 0.00    Total pack years: 7.50    Types: Cigarettes    Start date: 11/03/1999    Last attempt to quit: 09/26/2014    Years since quitting: 7.6   Smokeless tobacco: Never   Tobacco comments:    Patient started smoking again in the past year  Vaping Use   Vaping Use: Never used  Substance and Sexual Activity   Alcohol use: Not Currently    Alcohol/week: 0.0 standard drinks of alcohol   Drug use: No   Sexual activity: Yes    Birth control/protection: Surgical    Comment: Hysterectomy   Other Topics Concern   Not on file  Social History Narrative   Not on file   Social Determinants of Health   Financial Resource Strain: Not on file  Food Insecurity: Not on file  Transportation Needs: Not on file  Physical Activity: Not on file  Stress: Not on file  Social Connections: Not on file  Intimate Partner Violence: Not on file    Review of Systems: See HPI, otherwise negative ROS  Physical Exam: BP (!) 129/98   Pulse 89   Temp 97.8 F (36.6 C) (Temporal)   Resp 18   Ht 5\' 7"  (1.702 m)   Wt 105.7 kg   SpO2 99%   BMI 36.49 kg/m  General:   Alert,  pleasant and cooperative in NAD Head:  Normocephalic and atraumatic. Neck:  Supple; no masses or thyromegaly. Lungs:  Clear throughout to auscultation, normal respiratory effort.    Heart:  +S1, +S2,  Regular rate and rhythm, No edema. Abdomen:  Soft, nontender and nondistended. Normal bowel sounds, without guarding, and without rebound.   Neurologic:  Alert and  oriented x4;  grossly normal neurologically.  Impression/Plan: BREANAH ALEXANDRA is here for an endoscopy  to be performed for  evaluation of dysphagia    Risks, benefits, limitations, and alternatives regarding endoscopy have been reviewed with the patient.  Questions have been answered.  All parties agreeable.   Bailey Mech  Vicente Males, MD  05/15/2022, 11:53 AM

## 2022-05-15 NOTE — Op Note (Signed)
[facility] Gastroenterology Patient Name: [name] Procedure Date: 05/15/2022 12:39 PM MRN: [id number] Account #: [id number] Date of Birth: 01-Mar-1980 Admit Type: Outpatient Age: 42 Room: [facility] ENDO ROOM 3 Gender: Female Note Status: Finalized Instrument Name: Upper Endoscope [phone] Procedure:             Upper GI endoscopy Indications:           Dysphagia Providers:             Jonathon Bellows MD, MD Medicines:             Monitored Anesthesia Care Complications:         No immediate complications. Procedure:             Pre-Anesthesia Assessment:                        - Prior to the procedure, a History and Physical was                         performed, and patient medications, allergies and                         sensitivities were reviewed. The patient's tolerance                         of previous anesthesia was reviewed.                        - The risks and benefits of the procedure and the                         sedation options and risks were discussed with the                         patient. All questions were answered and informed                         consent was obtained.                        - ASA Grade Assessment: II - A patient with mild                         systemic disease.                        After obtaining informed consent, the endoscope was                         passed under direct vision. Throughout the procedure,                         the patient's blood pressure, pulse, and oxygen                         saturations were monitored continuously. The Endoscope                         was introduced through the mouth, and advanced to the  third part of duodenum. The upper GI endoscopy was                         accomplished with ease. The patient tolerated the                         procedure well. Findings:      The examined duodenum was normal.      The stomach was normal.      The  cardia and gastric fundus were normal on retroflexion.      The examined esophagus was normal. A TTS dilator was passed through the       scope. Dilation with a 15-16.5-18 mm balloon dilator was performed to 18       mm. The dilation site was examined and showed no change. Biopsies were       taken with a cold forceps for histology.      Abnormal motility was noted in the esophagus. There is a decrease in       motility of the esophageal body. The distal esophagus/lower esophageal       sphincter is open. Impression:            - Normal examined duodenum.                        - Normal stomach.                        - Normal esophagus. Dilated. Biopsied.                        - Abnormal esophageal motility. Recommendation:        - Discharge patient to home (with escort).                        - Resume previous diet.                        - Continue present medications.                        - Await pathology results.                        - Return to my office as previously scheduled. Procedure Code(s):     --- Professional ---                        [phone], Esophagogastroduodenoscopy, flexible,                         transoral; with transendoscopic balloon dilation of                         esophagus (less than 30 mm diameter)                        [id number], 59, Esophagogastroduodenoscopy, flexible,                         transoral; with biopsy, single or multiple Diagnosis Code(s):     --- Professional ---  K22.4, Dyskinesia of esophagus                        R13.10, Dysphagia, unspecified CPT copyright 2022 American Medical Association. All rights reserved. The codes documented in this report are preliminary and upon coder review may  be revised to meet current compliance requirements. Jonathon Bellows, MD Jonathon Bellows MD, MD 05/15/2022 12:52:33 PM This report has been signed electronically. Number of Addenda: 0 Note Initiated On: 05/15/2022 12:39  PM Estimated Blood Loss:  Estimated blood loss: none.      [facility]

## 2022-05-15 NOTE — Anesthesia Postprocedure Evaluation (Signed)
Anesthesia Post Note  Patient: [name]  Procedure(s) Performed: ESOPHAGOGASTRODUODENOSCOPY (EGD) WITH PROPOFOL  Patient location during evaluation: Endoscopy Anesthesia Type: General Level of consciousness: awake and alert Pain management: pain level controlled Vital Signs Assessment: post-procedure vital signs reviewed and stable Respiratory status: spontaneous breathing, nonlabored ventilation, respiratory function stable and patient connected to nasal cannula oxygen Cardiovascular status: blood pressure returned to baseline and stable Postop Assessment: no apparent nausea or vomiting Anesthetic complications: no   There were no known notable events for this encounter.   Last Vitals:  Vitals:   05/15/22 1150 05/15/22 1251  BP: (!) 129/98 116/84  Pulse: 89 88  Resp: 18 (!) 27  Temp: 36.6 C (!) 36.2 C  SpO2: 99% 95%    Last Pain:  Vitals:   05/15/22 1311  TempSrc:   PainSc: 0-No pain                 Martha Clan

## 2022-05-16 ENCOUNTER — Encounter: Payer: Self-pay | Admitting: Gastroenterology

## 2022-05-16 NOTE — Anesthesia Postprocedure Evaluation (Signed)
Anesthesia Post Note  Patient: [name]  Procedure(s) Performed: ESOPHAGOGASTRODUODENOSCOPY (EGD) WITH PROPOFOL  Patient location during evaluation: Endoscopy Anesthesia Type: General Level of consciousness: awake and alert Pain management: pain level controlled Vital Signs Assessment: post-procedure vital signs reviewed and stable Respiratory status: spontaneous breathing, nonlabored ventilation, respiratory function stable and patient connected to nasal cannula oxygen Cardiovascular status: blood pressure returned to baseline and stable Postop Assessment: no apparent nausea or vomiting Anesthetic complications: no   There were no known notable events for this encounter.   Last Vitals:  Vitals:   05/15/22 1150 05/15/22 1251  BP: (!) 129/98 116/84  Pulse: 89 88  Resp: 18 (!) 27  Temp: 36.6 C (!) 36.2 C  SpO2: 99% 95%    Last Pain:  Vitals:   05/15/22 1311  TempSrc:   PainSc: 0-No pain                 Martha Clan

## 2022-05-17 LAB — SURGICAL PATHOLOGY

## 2022-05-20 ENCOUNTER — Encounter: Payer: Self-pay | Admitting: Gastroenterology

## 2022-05-20 NOTE — Telephone Encounter (Signed)
Less likely related to the procedure although may have been exposed to other sick people while in the hospital. Yes follow up with family doctor.

## 2022-05-24 ENCOUNTER — Ambulatory Visit: Payer: 59 | Admitting: Diagnostic Neuroimaging

## 2022-05-24 ENCOUNTER — Encounter: Payer: Self-pay | Admitting: Physician Assistant

## 2022-05-24 ENCOUNTER — Encounter: Payer: Self-pay | Admitting: Diagnostic Neuroimaging

## 2022-05-24 VITALS — BP 123/85 | HR 82 | Ht 67.0 in | Wt 232.0 lb

## 2022-05-24 DIAGNOSIS — R519 Headache, unspecified: Secondary | ICD-10-CM

## 2022-05-24 MED ORDER — NURTEC 75 MG PO TBDP: 75.0000 mg | ORAL_TABLET | Freq: Every day | ORAL | 6 refills | Status: DC | PRN

## 2022-05-24 NOTE — Progress Notes (Addendum)
[facility]  PATIENT: [name] DOB: June 18, 1980  REFERRING CLINICIAN: Mikey Kirschner, PA-C HISTORY FROM: patient  REASON FOR VISIT: new consult   HISTORICAL  CHIEF COMPLAINT:  Chief Complaint  Patient presents with   New Patient (Initial Visit)    Internal referral for migraines, dysphagia, hx of EDS    HISTORY OF PRESENT ILLNESS:   42 year old female here for evaluation of headaches.  Patient has had multiple chronic issues over many years including joint pain, dizziness, constipation, dysphagia, fatigue.  She had great evaluations over the years.  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  She has been evaluated by cardiology and GI in the past.  In terms of headaches she has had migraine type headaches since age 15 years old.  She describes one-sided pressure and throbbing sensation with brain fog, vertigo, sensitive to light and nausea.  She was having over 15 headaches per month but now having about 5 to 7/month.  She has tried right medications with headache and wellness under the past including Botox injections, trigger point ejections, amitriptyline, tizanidine, triptans and topiramate.  Aimovig was prescribed but could not be covered by insurance in the past.  Lately having some worsening headaches.  Headaches worse in the early morning when she wakes up.  They seem to improve when she sits up or stands up, worse when she lays down.  Also with excessive daytime fatigue.  Has never had a sleep study.   REVIEW OF SYSTEMS: Full 14 system review of systems performed and negative with exception of: as per HPI.  ALLERGIES: Allergies  Allergen Reactions   Compazine Other (See Comments)    Dystonia   Metoclopramide Other (See Comments)    dystonia    HOME MEDICATIONS: Outpatient Medications Prior to Visit  Medication Sig Dispense Refill   cyclobenzaprine (FLEXERIL) 10 MG tablet Take 10 mg  by mouth 3 (three) times daily as needed for muscle spasms.     gabapentin (NEURONTIN) 800 MG tablet Take 800 mg by mouth 4 (four) times daily.     methylphenidate (RITALIN) 5 MG tablet Take 5 mg by mouth daily.     Multiple Vitamin (MULTIVITAMIN WITH MINERALS) TABS tablet Take 1 tablet by mouth daily.     omeprazole (PRILOSEC) 40 MG capsule Take 1 capsule (40 mg total) by mouth daily. 90 capsule 3   Oxycodone HCl 20 MG TABS Take 1 tablet by mouth 4 (four) times daily as needed.     polyethylene glycol (MIRALAX / GLYCOLAX) packet Take 17 g by mouth every other day.      rizatriptan (MAXALT) 10 MG tablet TAKE ONE TABLET AS NEEDED FOR MIGRAINE. MAY REPEAT IN TWO HOURS IF NEEDED 10 tablet 5   valACYclovir (VALTREX) 500 MG tablet Take 2 tablets (1,000 mg total) by mouth 2 (two) times daily as needed (cold sores). 20 tablet 5   Vitamin D, Ergocalciferol, (DRISDOL) 1.25 MG (50000 UNIT) CAPS capsule TAKE 1 CAPSULE (50,000 UNITS) BY MOUTH EVERY 7 DAYS 12 capsule 3   baclofen (LIORESAL) 20 MG tablet Take 20 mg by mouth 3 (three) times daily.     No facility-administered medications prior to visit.    PAST MEDICAL HISTORY: Past Medical History:  Diagnosis Date   Acute pyelonephritis 06/29/2012   Anxiety    Arthritis    Asthma    AS A CHILD   Complication of anesthesia    TROUBLE URINATING IN THE PAST   Ehlers-Danlos syndrome  Gallstones    GERD (gastroesophageal reflux disease)    History of kidney stones    Kidney stones    bilateral   Migraine    Pelvic pain in female    PONV (postoperative nausea and vomiting)    WITH HYSTERECTOMY ONLY   Pyelonephritis 2015   Renal mass 2017   right. considered a normal cyst on kidney.   Tobacco abuse     PAST SURGICAL HISTORY: Past Surgical History:  Procedure Laterality Date   ABDOMINAL HYSTERECTOMY  2006   ANKLE ARTHROSCOPY Left 2017   ANKLE ARTHROSCOPY Left 11/24/2015   APPENDECTOMY  [id number]   CAST APPLICATION  [id number]   Procedure:  CAST APPLICATION;  Surgeon: Johnn Hai, MD;  Location: [facility];  Service: Orthopedics;  Laterality: Left;   CHOLECYSTECTOMY  03/11/2011   Procedure: LAPAROSCOPIC CHOLECYSTECTOMY WITH INTRAOPERATIVE CHOLANGIOGRAM;  Surgeon: Gayland Curry, MD;  Location: [place];  Service: General;  Laterality: N/A;   CHOLECYSTECTOMY, LAPAROSCOPIC     CYSTOSCOPY/URETEROSCOPY/HOLMIUM LASER/STENT PLACEMENT Left 06/18/2016   Procedure: CYSTOSCOPY/URETEROSCOPY/HOLMIUM LASER/STENT PLACEMENT;  Surgeon: Hollice Espy, MD;  Location: [facility];  Service: Urology;  Laterality: Left;   ESOPHAGOGASTRODUODENOSCOPY (EGD) WITH PROPOFOL N/A 05/15/2022   Procedure: ESOPHAGOGASTRODUODENOSCOPY (EGD) WITH PROPOFOL;  Surgeon: Jonathon Bellows, MD;  Location: [facility] ENDOSCOPY;  Service: Gastroenterology;  Laterality: N/A;   LAPAROSCOPIC OVARIAN CYSTECTOMY Right 09/02/2017   Procedure: LAPAROSCOPIC OVARIAN CYSTECTOMY;  Surgeon: Gae Dry, MD;  Location: [facility];  Service: Gynecology;  Laterality: Right;   LITHOTRIPSY     x 4   ORIF ANKLE FRACTURE  12/25/2011   Procedure: OPEN REDUCTION INTERNAL FIXATION (ORIF) ANKLE FRACTURE;  Surgeon: Johnn Hai, MD;  Location: [facility];  Service: Orthopedics;  Laterality: Left;   TONSILLECTOMY      FAMILY HISTORY: Family History  Problem Relation Age of Onset   Diabetes Mother    Diabetes Father    Heart failure Father    Hypertension Father    Pulmonary fibrosis Father        Secondary to amiodarone   Nephrolithiasis Father    Arrhythmia Father        Suspected ventricular tachycardia/fibrillation   Other Brother        Hypermobile joints   Kidney cancer Neg Hx    Bladder Cancer Neg Hx     SOCIAL HISTORY: Social History   Socioeconomic History   Marital status: Married    Spouse name: Mia Creek   Number of children: Not on file   Years of education: Not on file   Highest education level: Not on file  Occupational History   Not on file  Tobacco Use   Smoking status: Every Day     Packs/day: 0.50    Years: 15.00    Additional pack years: 0.00    Total pack years: 7.50    Types: Cigarettes    Start date: 11/03/1999    Last attempt to quit: 09/26/2014    Years since quitting: 7.6   Smokeless tobacco: Never   Tobacco comments:    Patient started smoking again in the past year  Vaping Use   Vaping Use: Never used  Substance and Sexual Activity   Alcohol use: Not Currently    Alcohol/week: 0.0 standard drinks of alcohol   Drug use: No   Sexual activity: Yes    Birth control/protection: Surgical    Comment: Hysterectomy   Other Topics Concern   Not on file  Social History Narrative   Not  on file   Social Determinants of Health   Financial Resource Strain: Not on file  Food Insecurity: Not on file  Transportation Needs: Not on file  Physical Activity: Not on file  Stress: Not on file  Social Connections: Not on file  Intimate Partner Violence: Not on file     PHYSICAL EXAM  GENERAL EXAM/CONSTITUTIONAL: Vitals:  Vitals:   05/24/22 0900  BP: 123/85  Pulse: 82  Weight: 232 lb (105.2 kg)  Height: 5\' 7"  (1.702 m)   Body mass index is 36.34 kg/m. Wt Readings from Last 3 Encounters:  05/24/22 232 lb (105.2 kg)  05/15/22 233 lb (105.7 kg)  04/29/22 236 lb (107 kg)   Patient is in no distress; well developed, nourished and groomed; neck is supple  CARDIOVASCULAR: Examination of carotid arteries is normal; no carotid bruits Regular rate and rhythm, no murmurs Examination of peripheral vascular system by observation and palpation is normal  EYES: Ophthalmoscopic exam of optic discs and posterior segments is normal; no papilledema or hemorrhages No results found.  MUSCULOSKELETAL: Gait, strength, tone, movements noted in Neurologic exam below  NEUROLOGIC: MENTAL STATUS:      No data to display         awake, alert, oriented to person, place and time recent and remote memory intact normal attention and concentration language fluent,  comprehension intact, naming intact fund of knowledge appropriate  CRANIAL NERVE:  2nd - no papilledema on fundoscopic exam 2nd, 3rd, 4th, 6th - pupils equal and reactive to light, visual fields full to confrontation, extraocular muscles intact, no nystagmus 5th - facial sensation symmetric 7th - facial strength symmetric 8th - hearing intact 9th - palate elevates symmetrically, uvula midline 11th - shoulder shrug symmetric 12th - tongue protrusion midline  MOTOR:  normal bulk and tone, full strength in the BUE, BLE  SENSORY:  normal and symmetric to light touch, temperature, vibration  COORDINATION:  finger-nose-finger, fine finger movements normal  REFLEXES:  deep tendon reflexes present and symmetric  GAIT/STATION:  narrow based gait     DIAGNOSTIC DATA (LABS, IMAGING, TESTING) - I reviewed patient records, labs, notes, testing and imaging myself where available.  Lab Results  Component Value Date   WBC 10.5 11/06/2021   HGB 14.0 11/06/2021   HCT 42.8 11/06/2021   MCV 91 11/06/2021   PLT 299 11/06/2021      Component Value Date/Time   NA 139 11/06/2021 1124   NA 138 06/14/2014 1557   K 4.3 11/06/2021 1124   K 3.6 06/14/2014 1557   CL 102 11/06/2021 1124   CL 112 (H) 06/14/2014 1557   CO2 26 11/06/2021 1124   CO2 23 06/14/2014 1557   GLUCOSE 89 11/06/2021 1124   GLUCOSE 80 06/08/2019 1249   GLUCOSE 85 06/14/2014 1557   BUN 7 11/06/2021 1124   BUN 12 06/14/2014 1557   CREATININE 0.73 11/06/2021 1124   CREATININE 0.74 06/14/2014 1557   CALCIUM 9.4 11/06/2021 1124   CALCIUM 9.6 06/14/2014 1557   PROT 6.7 11/06/2021 1124   PROT 7.7 06/14/2014 1557   ALBUMIN 4.1 11/06/2021 1124   ALBUMIN 4.5 06/14/2014 1557   AST 11 11/06/2021 1124   AST 13 (L) 06/14/2014 1557   ALT 13 11/06/2021 1124   ALT 10 (L) 06/14/2014 1557   ALKPHOS 118 11/06/2021 1124   ALKPHOS 81 06/14/2014 1557   BILITOT <0.2 11/06/2021 1124   BILITOT 0.7 06/14/2014 1557   GFRNONAA >60  06/08/2019 1249   GFRNONAA >60  06/14/2014 1557   GFRAA >60 06/08/2019 1249   GFRAA >60 06/14/2014 1557   Lab Results  Component Value Date   CHOL 211 (H) 11/06/2021   HDL 40 11/06/2021   LDLCALC 127 (H) 11/06/2021   TRIG 247 (H) 11/06/2021   CHOLHDL 5.3 (H) 11/06/2021   Lab Results  Component Value Date   HGBA1C 5.1 11/25/2018   No results found for: "VITAMINB12" Lab Results  Component Value Date   TSH 1.500 11/06/2021    09/14/09 MRI brain - No acute infarct.  - Scattered mild nonspecific white matter type changes as discussed above.     ASSESSMENT AND PLAN  42 y.o. year old female here with:  Meds tried: Botox injections, trigger point injections, amitriptyline, tizanidine, triptans and topiramate.  Dx:  1. Worsening headaches     PLAN:  WORSENING HEADACHES (positional; worse laying down; wake up headaches) - check MRI brain, MRA head (ehlers danlos screening) - follow up with ophthalmology; if papilledema, then check LP (consider IIH) - check sleep study (fatigue, wake up headaches)  Migraine with aura   MIGRAINE TREATMENT PLAN:  MIGRAINE PREVENTION  LIFESTYLE CHANGES -Stop or avoid smoking -Decrease or avoid caffeine / alcohol -Eat and sleep on a regular schedule -Exercise several times per week - start rimegepant (Nurtec) 75mg  every other day  MIGRAINE RESCUE  - ibuprofen, tylenol as needed - continue rizatriptan (Maxalt) 10mg  as needed for breakthrough headache; may repeat x 1 after 2 hours; max 2 tabs per day or 8 per month  Orders Placed This Encounter  Procedures   MR BRAIN W WO CONTRAST   MR [place]   Ambulatory referral to Sleep Studies   Meds ordered this encounter  Medications   Rimegepant Sulfate (NURTEC) 75 MG TBDP    Sig: Take 1 tablet (75 mg total) by mouth daily as needed.    Dispense:  8 tablet    Refill:  6   Return in about 5 months (around 10/24/2022) for MyChart visit (15 min).    Penni Bombard,  MD [id number], [id number] AM Certified in Neurology, Neurophysiology and Neuroimaging  [facility] [address] [place], [place] [address] [phone]

## 2022-05-24 NOTE — Patient Instructions (Addendum)
  WORSENING HEADACHES (positional; worse laying down; wake up headaches) - check MRI brain - follow up with ophthalmology; if papilledema, then check LP - check sleep study (fatigue, wake up headaches)  Migraine with aura   MIGRAINE TREATMENT PLAN:  MIGRAINE PREVENTION  LIFESTYLE CHANGES -Stop or avoid smoking -Decrease or avoid caffeine / alcohol -Eat and sleep on a regular schedule -Exercise several times per week - start rimegepant (Nurtec) 75mg  every other day  MIGRAINE RESCUE  - ibuprofen, tylenol as needed - continue rizatriptan (Maxalt) 10mg  as needed for breakthrough headache; may repeat x 1 after 2 hours; max 2 tabs per day or 8 per month

## 2022-05-27 ENCOUNTER — Other Ambulatory Visit: Payer: Self-pay | Admitting: Physician Assistant

## 2022-05-28 ENCOUNTER — Telehealth: Payer: Self-pay | Admitting: Diagnostic Neuroimaging

## 2022-05-28 NOTE — Telephone Encounter (Signed)
MRI brain [employer] auth: [id number] exp. 05/28/22-07/12/22  MRA head [employer] auth: [id number] exp. 05/28/22-07/12/22  Sent to [place] they will schedule her at [facility] like requested. [id number]

## 2022-05-30 ENCOUNTER — Telehealth: Payer: Self-pay

## 2022-05-30 ENCOUNTER — Other Ambulatory Visit: Payer: Self-pay | Admitting: Physician Assistant

## 2022-05-30 DIAGNOSIS — Q796 Ehlers-Danlos syndrome, unspecified: Secondary | ICD-10-CM

## 2022-05-30 NOTE — Telephone Encounter (Signed)
PA sen tto [facility]/[employer]/Nurtec Key: [id number] Waiting for resposne

## 2022-05-31 ENCOUNTER — Encounter: Payer: Self-pay | Admitting: Family Medicine

## 2022-05-31 ENCOUNTER — Ambulatory Visit: Payer: 59 | Admitting: Family Medicine

## 2022-05-31 VITALS — BP 130/80 | Ht 67.0 in | Wt 232.0 lb

## 2022-05-31 DIAGNOSIS — Q796 Ehlers-Danlos syndrome, unspecified: Secondary | ICD-10-CM

## 2022-05-31 NOTE — Progress Notes (Signed)
  [name] - 42 y.o. female MRN [id number]  Date of birth: [birth year]  SUBJECTIVE:  Including CC & ROS.  No chief complaint on file.   [name] is a 42 y.o. female that is presenting with multiple joint pains and questions regarding hypermobility.  Her symptoms are acute on chronic in nature.  She reports multiple joints causing pain.  She has had previous imaging that does not demonstrated any pathological changes.    Review of Systems See HPI   HISTORY: Past Medical, Surgical, Social, and Family History Reviewed & Updated per EMR.   Pertinent Historical Findings include:  Past Medical History:  Diagnosis Date   Acute pyelonephritis 06/29/2012   Anxiety    Arthritis    Asthma    AS A CHILD   Complication of anesthesia    TROUBLE URINATING IN THE PAST   Ehlers-Danlos syndrome    Gallstones    GERD (gastroesophageal reflux disease)    History of kidney stones    Kidney stones    bilateral   Migraine    Pelvic pain in female    PONV (postoperative nausea and vomiting)    WITH HYSTERECTOMY ONLY   Pyelonephritis 2015   Renal mass 2017   right. considered a normal cyst on kidney.   Tobacco abuse     Past Surgical History:  Procedure Laterality Date   ABDOMINAL HYSTERECTOMY  2006   ANKLE ARTHROSCOPY Left 2017   ANKLE ARTHROSCOPY Left 11/24/2015   APPENDECTOMY  07/26/13   CAST APPLICATION  12/25/2011   Procedure: CAST APPLICATION;  Surgeon: Javier Docker, MD;  Location: [facility];  Service: Orthopedics;  Laterality: Left;   CHOLECYSTECTOMY  03/11/2011   Procedure: LAPAROSCOPIC CHOLECYSTECTOMY WITH INTRAOPERATIVE CHOLANGIOGRAM;  Surgeon: Atilano Ina, MD;  Location: [facility] OR;  Service: General;  Laterality: N/A;   CHOLECYSTECTOMY, LAPAROSCOPIC     CYSTOSCOPY/URETEROSCOPY/HOLMIUM LASER/STENT PLACEMENT Left 06/18/2016   Procedure: CYSTOSCOPY/URETEROSCOPY/HOLMIUM LASER/STENT PLACEMENT;  Surgeon: Vanna Scotland, MD;  Location: [facility];  Service: Urology;   Laterality: Left;   ESOPHAGOGASTRODUODENOSCOPY (EGD) WITH PROPOFOL N/A 05/15/2022   Procedure: ESOPHAGOGASTRODUODENOSCOPY (EGD) WITH PROPOFOL;  Surgeon: Wyline Mood, MD;  Location: [facility] ENDOSCOPY;  Service: Gastroenterology;  Laterality: N/A;   LAPAROSCOPIC OVARIAN CYSTECTOMY Right 09/02/2017   Procedure: LAPAROSCOPIC OVARIAN CYSTECTOMY;  Surgeon: Nadara Mustard, MD;  Location: [facility];  Service: Gynecology;  Laterality: Right;   LITHOTRIPSY     x 4   ORIF ANKLE FRACTURE  12/25/2011   Procedure: OPEN REDUCTION INTERNAL FIXATION (ORIF) ANKLE FRACTURE;  Surgeon: Javier Docker, MD;  Location: [facility];  Service: Orthopedics;  Laterality: Left;   TONSILLECTOMY       PHYSICAL EXAM:  VS: BP 130/80 (BP Location: Left Arm, Patient Position: Sitting)   Ht 5\' 7"  (1.702 m)   Wt 232 lb (105.2 kg)   BMI 36.34 kg/m  Physical Exam Gen: NAD, alert, cooperative with exam, well-appearing MSK:  Neurovascularly intact       ASSESSMENT & PLAN:   Ehlers-Danlos syndrome Beighton score 8/9.  This is contributing to her multiple joint pain and instability.  She also has GI issues and intermittent headaches.  Has been evaluated by neurologist and cardiologist. -Counseled on home exercise therapy and supportive care. -Could consider physical therapy -Consider naltrexone for chronic pain.

## 2022-05-31 NOTE — Assessment & Plan Note (Signed)
Beighton score 8/9.  This is contributing to her multiple joint pain and instability.  She also has GI issues and intermittent headaches.  Has been evaluated by neurologist and cardiologist. -Counseled on home exercise therapy and supportive care. -Could consider physical therapy -Consider naltrexone for chronic pain.

## 2022-05-31 NOTE — Patient Instructions (Signed)
Nice to meet you Please check out body braid  You can consider body helix for the ankles  You can look up Laray Anger and her resources  We could make custom orthotics in the future  Please send me a message in MyChart with any questions or updates.  Please see me back as needed.   --Dr. Jordan Likes

## 2022-06-06 ENCOUNTER — Other Ambulatory Visit: Payer: Self-pay | Admitting: *Deleted

## 2022-06-06 DIAGNOSIS — Q796 Ehlers-Danlos syndrome, unspecified: Secondary | ICD-10-CM

## 2022-06-07 ENCOUNTER — Telehealth: Payer: Self-pay | Admitting: Family Medicine

## 2022-06-07 LAB — TSH: TSH: 1.54 u[IU]/mL (ref 0.450–4.500)

## 2022-06-07 LAB — FERRITIN: Ferritin: 181 ng/mL — ABNORMAL HIGH (ref 15–150)

## 2022-06-07 NOTE — Telephone Encounter (Signed)
Pt informed of below.  

## 2022-06-07 NOTE — Telephone Encounter (Signed)
Left VM for patient. If she calls back please have her speak with a nurse/CMA and inform that her TSH was normal and her ferritin was mildly elevated.  This could be a nonspecific inflammatory marker and would also stop taking any oral iron if she is..   If any questions then please take the best time and phone number to call and I will try to call her back.   Myra Rude, MD [facility] 06/07/2022, 10:58 AM

## 2022-06-10 ENCOUNTER — Encounter: Payer: Self-pay | Admitting: *Deleted

## 2022-06-17 NOTE — Progress Notes (Unsigned)
10/30/2021 4:10 PM   [name] 1980-02-26 [id number]  Referring provider: Alfredia Ferguson, PA-C [address] [place],  [place] [address]  Urological history: 1. Nephrolithiasis -ESWL x 4 and URS x 1 -Stone analysis shows 5% calcium oxalate dihydrate, 45% calcium oxalate monohydrate, and 50% calcium phosphate -CT renal stone study (10/2021) - three non obstructing calculi in the RIGHT kidney measuring 1-3 mm. RIGHT ureterolithiasis or obstructive uropathy - four nonobstructing calculi in the LEFT kidney ranging size from 2-4 mm.   2. Intermediate risk hematuria -former smoker -cysto 10/2019 NED -CTU 08/2020 - Bilateral nonobstructive nephrolithiasis  Chief Complaint  Patient presents with   Nephrolithiasis     HPI: [name] is a 42 y.o. female who presents today yearly follow up.   UA ***  KUB ***   PMH: Past Medical History:  Diagnosis Date   Acute pyelonephritis 06/29/2012   Anxiety    Arthritis    Asthma    AS A CHILD   Complication of anesthesia    TROUBLE URINATING IN THE PAST   Ehlers-Danlos syndrome    Gallstones    GERD (gastroesophageal reflux disease)    History of kidney stones    Kidney stones    bilateral   Migraine    Pelvic pain in female    PONV (postoperative nausea and vomiting)    WITH HYSTERECTOMY ONLY   Pyelonephritis 2015   Renal mass 2017   right. considered a normal cyst on kidney.   Tobacco abuse     Surgical History: Past Surgical History:  Procedure Laterality Date   ABDOMINAL HYSTERECTOMY  2006   ANKLE ARTHROSCOPY Left 2017   ANKLE ARTHROSCOPY Left 11/24/2015   APPENDECTOMY  07/26/13   CAST APPLICATION  12/25/2011   Procedure: CAST APPLICATION;  Surgeon: Javier Docker, MD;  Location: [facility];  Service: Orthopedics;  Laterality: Left;   CHOLECYSTECTOMY  03/11/2011   Procedure: LAPAROSCOPIC CHOLECYSTECTOMY WITH INTRAOPERATIVE CHOLANGIOGRAM;  Surgeon: Atilano Ina, MD;  [facility];   Service: General;  Laterality: N/A;   CHOLECYSTECTOMY, LAPAROSCOPIC     CYSTOSCOPY/URETEROSCOPY/HOLMIUM LASER/STENT PLACEMENT Left 06/18/2016   Procedure: CYSTOSCOPY/URETEROSCOPY/HOLMIUM LASER/STENT PLACEMENT;  Surgeon: Vanna Scotland, MD;  Location: [facility] ORS;  Service: Urology;  Laterality: Left;   LAPAROSCOPIC OVARIAN CYSTECTOMY Right 09/02/2017   Procedure: LAPAROSCOPIC OVARIAN CYSTECTOMY;  Surgeon: Nadara Mustard, MD;  Location: [facility] ORS;  Service: Gynecology;  Laterality: Right;   LITHOTRIPSY     x 4   ORIF ANKLE FRACTURE  12/25/2011   Procedure: OPEN REDUCTION INTERNAL FIXATION (ORIF) ANKLE FRACTURE;  Surgeon: Javier Docker, MD;  Location: [facility];  Service: Orthopedics;  Laterality: Left;   TONSILLECTOMY      Home Medications:  Allergies as of 10/30/2021       Reactions   Compazine Other (See Comments)   Dystonia   Metoclopramide Other (See Comments)   dystonia        Medication List        Accurate as of October 30, 2021  4:10 PM. If you have any questions, ask your nurse or doctor.          ALPRAZolam 1 MG tablet Commonly known as: XANAX Take 0.5 tablets (0.5 mg total) by mouth at bedtime as needed for anxiety or sleep.   gabapentin 800 MG tablet Commonly known as: NEURONTIN Take 800 mg by mouth 4 (four) times daily.   methylphenidate 5 MG tablet Commonly known as: RITALIN Take 5 mg  by mouth daily.   multivitamin with minerals Tabs tablet Take 1 tablet by mouth daily.   omeprazole 20 MG capsule Commonly known as: PRILOSEC TAKE 1 CAPSULE BY MOUTH ONCE DAILY   Oxycodone HCl 10 MG Tabs Take by mouth.   polyethylene glycol 17 g packet Commonly known as: MIRALAX / GLYCOLAX Take 17 g by mouth every other day.   rizatriptan 10 MG tablet Commonly known as: MAXALT TAKE 1 TABLET AS NEEDED FOR MIGRAINE. MAY REPEAT IN TWO HOURS IF NEEDED   sulfamethoxazole-trimethoprim 800-160 MG tablet Commonly known as: BACTRIM DS Take 1 tablet by mouth every 12  (twelve) hours. Started by: Michiel Cowboy, PA-C   valACYclovir 500 MG tablet Commonly known as: VALTREX Take 2 tablets (1,000 mg total) by mouth 2 (two) times daily as needed (cold sores).   Vitamin D (Ergocalciferol) 1.25 MG (50000 UNIT) Caps capsule Commonly known as: DRISDOL TAKE 1 CAPSULE (50,000 UNITS) BY MOUTH EVERY 7 DAYS        Allergies:  Allergies  Allergen Reactions   Compazine Other (See Comments)    Dystonia   Metoclopramide Other (See Comments)    dystonia    Family History: Family History  Problem Relation Age of Onset   Diabetes Mother    Diabetes Father    Heart failure Father    Hypertension Father    Pulmonary fibrosis Father        Secondary to amiodarone   Nephrolithiasis Father    Arrhythmia Father        Suspected ventricular tachycardia/fibrillation   Other Brother        Hypermobile joints   Kidney cancer Neg Hx    Bladder Cancer Neg Hx     Social History:  reports that she quit smoking about 7 years ago. Her smoking use included cigarettes. She started smoking about 22 years ago. She has a 7.50 pack-year smoking history. She has never used smokeless tobacco. She reports that she does not currently use alcohol. She reports that she does not use drugs.  ROS: Pertinent ROS in HPI  Physical Exam: BP 114/80   Pulse 89   Ht 5\' 7"  (1.702 m)   Wt 240 lb (108.9 kg)   BMI 37.59 kg/m   Constitutional:  Well nourished. Alert and oriented, No acute distress. HEENT: [place] AT, moist mucus membranes.  Trachea midline, no masses. Cardiovascular: No clubbing, cyanosis, or edema. Respiratory: Normal respiratory effort, no increased work of breathing. GU: No CVA tenderness.  No bladder fullness or masses. Vulvovaginal atrophy w/ pallor, loss of rugae, introital retraction, excoriations.  Vulvar thinning, fusion of labia, clitoral hood retraction, prominent urethral meatus.   *** external genitalia, *** pubic hair distribution, no lesions.  Normal  urethral meatus, no lesions, no prolapse, no discharge.   No urethral masses, tenderness and/or tenderness. No bladder fullness, tenderness or masses. *** vagina mucosa, *** estrogen effect, no discharge, no lesions, *** pelvic support, *** cystocele and *** rectocele noted.  No cervical motion tenderness.  Uterus is freely mobile and non-fixed.  No adnexal/parametria masses or tenderness noted.  Anus and perineum are without rashes or lesions.   ***  Neurologic: Grossly intact, no focal deficits, moving all 4 extremities. Psychiatric: Normal mood and affect.    Laboratory Data: Urinalysis See Epic and HPI. I have reviewed the labs.   Pertinent Imaging: *** I have independently reviewed the films.  See HPI.  Radiologist interpretation still pending  Assessment & Plan:    1. High risk  hematuria -CT Urogram with bilateral nephrolithiasis 08/2020 -cysto NED 10/2019 -reports of gross heme -UA 11-30 RBC's   2.  Nephrolithiasis *** Return for refer to gynecology for ovarian cyst .  These notes generated with voice recognition software. I apologize for typographical errors.  Cloretta Ned  [facility] [address]  [address] [place], [place] [address] [phone]

## 2022-06-18 ENCOUNTER — Ambulatory Visit
Admission: RE | Admit: 2022-06-18 | Discharge: 2022-06-18 | Disposition: A | Discharge status: Home / Self Care | Payer: 59 | Attending: Urology | Admitting: Urology

## 2022-06-18 ENCOUNTER — Ambulatory Visit: Payer: 59 | Admitting: Urology

## 2022-06-18 ENCOUNTER — Ambulatory Visit
Admission: RE | Admit: 2022-06-18 | Discharge: 2022-06-18 | Disposition: A | Discharge status: Home / Self Care | Payer: 59 | Source: Ambulatory Visit | Attending: Urology | Admitting: Urology

## 2022-06-18 ENCOUNTER — Encounter: Payer: Self-pay | Admitting: Urology

## 2022-06-18 VITALS — BP 130/84 | HR 94 | Ht 67.0 in | Wt 230.0 lb

## 2022-06-18 DIAGNOSIS — N200 Calculus of kidney: Secondary | ICD-10-CM

## 2022-06-18 DIAGNOSIS — R3129 Other microscopic hematuria: Secondary | ICD-10-CM

## 2022-06-18 DIAGNOSIS — R102 Pelvic and perineal pain: Secondary | ICD-10-CM | POA: Diagnosis not present

## 2022-06-18 DIAGNOSIS — R319 Hematuria, unspecified: Secondary | ICD-10-CM

## 2022-06-18 LAB — MICROSCOPIC EXAMINATION

## 2022-06-18 LAB — URINALYSIS, COMPLETE
Bilirubin, UA: NEGATIVE
Glucose, UA: NEGATIVE
Ketones, UA: NEGATIVE
Leukocytes,UA: NEGATIVE
Nitrite, UA: NEGATIVE
Protein,UA: NEGATIVE
Specific Gravity, UA: 1.015 (ref 1.005–1.030)
Urobilinogen, Ur: 0.2 mg/dL (ref 0.2–1.0)
pH, UA: 6 (ref 5.0–7.5)

## 2022-06-20 ENCOUNTER — Other Ambulatory Visit: Payer: 59

## 2022-06-22 LAB — CULTURE, URINE COMPREHENSIVE

## 2022-07-01 ENCOUNTER — Ambulatory Visit: Payer: 59 | Admitting: Physician Assistant

## 2022-07-01 ENCOUNTER — Ambulatory Visit: Payer: Self-pay

## 2022-07-01 ENCOUNTER — Encounter: Payer: Self-pay | Admitting: Physician Assistant

## 2022-07-01 VITALS — BP 110/78 | HR 85 | Temp 97.9°F | Resp 12 | Ht 67.0 in | Wt 231.0 lb

## 2022-07-01 DIAGNOSIS — R1013 Epigastric pain: Secondary | ICD-10-CM | POA: Diagnosis not present

## 2022-07-01 MED ORDER — SUCRALFATE 1 G PO TABS: 1.0000 g | ORAL_TABLET | Freq: Three times a day (TID) | ORAL | 0 refills | Status: DC

## 2022-07-01 NOTE — Progress Notes (Signed)
I,Sulibeya S Dimas,acting as a Neurosurgeon for [employer], PA-C.,have documented all relevant documentation on the behalf of Alfredia Ferguson, PA-C,as directed by  Alfredia Ferguson, PA-C while in the presence of Alfredia Ferguson, PA-C.     Established patient visit   Patient: [name]   DOB: [birth year]   42 y.o. Female  MRN: [id number] Visit Date: 07/01/2022  Today's healthcare provider: Alfredia Ferguson, PA-C   Chief Complaint  Patient presents with   Abdominal Pain   Subjective    Abdominal Pain This is a new problem. Episode onset: 2 days. The onset quality is sudden. The problem occurs intermittently. The problem has been unchanged. The pain is located in the epigastric region. The pain is severe. The quality of the pain is colicky, burning and a sensation of fullness. The abdominal pain radiates to the periumbilical region. Associated symptoms include belching, constipation, hematuria and nausea. Pertinent negatives include no diarrhea, dysuria, fever or vomiting. Nothing aggravates the pain. The pain is relieved by Nothing. She has tried nothing for the symptoms.    Medications: Outpatient Medications Prior to Visit  Medication Sig   cyclobenzaprine (FLEXERIL) 10 MG tablet Take 10 mg by mouth 3 (three) times daily as needed for muscle spasms.   gabapentin (NEURONTIN) 800 MG tablet Take 800 mg by mouth 3 (three) times daily.   methylphenidate (RITALIN) 5 MG tablet Take 5 mg by mouth as needed.   Multiple Vitamin (MULTIVITAMIN WITH MINERALS) TABS tablet Take 1 tablet by mouth daily.   omeprazole (PRILOSEC) 40 MG capsule Take 1 capsule (40 mg total) by mouth daily.   Oxycodone HCl 20 MG TABS Take 1 tablet by mouth 4 (four) times daily as needed.   polyethylene glycol (MIRALAX / GLYCOLAX) packet Take 17 g by mouth every other day.    rizatriptan (MAXALT) 10 MG tablet TAKE ONE TABLET AS NEEDED FOR MIGRAINE. MAY REPEAT IN TWO HOURS IF NEEDED   valACYclovir (VALTREX) 500 MG tablet  Take 2 tablets (1,000 mg total) by mouth 2 (two) times daily as needed (cold sores).   Vitamin D, Ergocalciferol, (DRISDOL) 1.25 MG (50000 UNIT) CAPS capsule TAKE 1 CAPSULE (50,000 UNITS) BY MOUTH EVERY 7 DAYS   Rimegepant Sulfate (NURTEC) 75 MG TBDP Take 1 tablet (75 mg total) by mouth daily as needed. (Patient not taking: Reported on 07/01/2022)   No facility-administered medications prior to visit.    Review of Systems  Constitutional:  Positive for appetite change. Negative for chills, fatigue and fever.  Respiratory:  Negative for cough, chest tightness and shortness of breath.   Cardiovascular:  Negative for chest pain.  Gastrointestinal:  Positive for abdominal distention, abdominal pain, constipation and nausea. Negative for blood in stool, diarrhea and vomiting.  Genitourinary:  Positive for hematuria. Negative for dysuria and flank pain.     Objective    BP 110/78 (BP Location: Right Arm, Patient Position: Sitting, Cuff Size: Large)   Pulse 85   Temp 97.9 F (36.6 C) (Temporal)   Resp 12   Ht 5\' 7"  (1.702 m)   Wt 231 lb (104.8 kg)   SpO2 100%   BMI 36.18 kg/m  BP Readings from Last 3 Encounters:  07/01/22 110/78  06/18/22 130/84  05/31/22 130/80   Wt Readings from Last 3 Encounters:  07/01/22 231 lb (104.8 kg)  06/18/22 230 lb (104.3 kg)  05/31/22 232 lb (105.2 kg)    Physical Exam Vitals reviewed.  Constitutional:      Appearance: She is not  ill-appearing.  HENT:     Head: Normocephalic.  Eyes:     Conjunctiva/sclera: Conjunctivae normal.  Cardiovascular:     Rate and Rhythm: Normal rate.  Pulmonary:     Effort: Pulmonary effort is normal. No respiratory distress.  Abdominal:     General: There is no distension.     Palpations: Abdomen is soft.     Tenderness: There is abdominal tenderness in the epigastric area. There is no guarding.  Neurological:     General: No focal deficit present.     Mental Status: She is alert and oriented to person, place, and  time.  Psychiatric:        Mood and Affect: Mood normal.        Behavior: Behavior normal.      No results found for any visits on 07/01/22.  Assessment & Plan     1. Epigastric pain Cont prilosec 40 mg  Add carafate up to 4 times a day. Will check cmp, lipase, targeted US r/o pancreatitis  Advised fluids, bland diet .  - Comprehensive Metabolic Panel (CMET) - Lipase - US Abdomen Limited; Future - sucralfate (CARAFATE) 1 g tablet; Take 1 tablet (1 g total) by mouth 4 (four) times daily -  with meals and at bedtime.  Dispense: 28 tablet; Refill: 0   2. Micro hematuria Being followed by uro  Return if symptoms worsen or fail to improve.      I, Alfredia Ferguson, PA-C have reviewed all documentation for this visit. The documentation on  07/01/22   for the exam, diagnosis, procedures, and orders are all accurate and complete.  Alfredia Ferguson, PA-C [facility] [address] [place], [place], [address] Office: [phone] Fax: [phone]   [facility]

## 2022-07-01 NOTE — Telephone Encounter (Signed)
  Chief Complaint: Abdominal pain Symptoms: pain Frequency: 2 days Pertinent Negatives: Patient denies fever Disposition: [] ED /[] Urgent Care (no appt availability in office) / [x] Appointment(In office/virtual)/ []  [place] [facility]/ [] Home Care/ [] Refused Recommended Disposition /[] [facility] [facility]/ []  Follow-up with PCP Additional Notes: Pt also has nausea. Pt states that pain starts after eating.   Reason for Disposition  [1] MILD-MODERATE pain AND [2] constant AND [3] present > 2 hours  Answer Assessment - Initial Assessment Questions 1. LOCATION: "Where does it hurt?"      Below the rib cage center and right 2. RADIATION: "Does the pain shoot anywhere else?" (e.g., chest, back)     Goes to lower abdomen 3. ONSET: "When did the pain begin?" (e.g., minutes, hours or days ago)      2 days - Saturday after a meal 4. SUDDEN: "Gradual or sudden onset?"     Sudden - 45 minutes after eating - burning -  5. PATTERN "Does the pain come and go, or is it constant?"    - If it comes and goes: "How long does it last?" "Do you have pain now?"     (Note: Comes and goes means the pain is intermittent. It goes away completely between bouts.)    - If constant: "Is it getting better, staying the same, or getting worse?"      (Note: Constant means the pain never goes away completely; most serious pain is constant and gets worse.)      Comes and goes 6. SEVERITY: "How bad is the pain?"  (e.g., Scale 1-10; mild, moderate, or severe)    - MILD (1-3): Doesn't interfere with normal activities, abdomen soft and not tender to touch.     - MODERATE (4-7): Interferes with normal activities or awakens from sleep, abdomen tender to touch.     - SEVERE (8-10): Excruciating pain, doubled over, unable to do any normal activities.       8/10 7. RECURRENT SYMPTOM: "Have you ever had this type of stomach pain before?" If Yes, ask: "When was the last time?" and "What happened that time?"       8. CAUSE:  "What do you think is causing the stomach pain?"      9. RELIEVING/AGGRAVATING FACTORS: "What makes it better or worse?" (e.g., antacids, bending or twisting motion, bowel movement)     Eating makes it worse. 10. OTHER SYMPTOMS: "Do you have any other symptoms?" (e.g., back pain, diarrhea, fever, urination pain, vomiting)  Protocols used: Abdominal Pain - Female-A-AH

## 2022-07-02 LAB — COMPREHENSIVE METABOLIC PANEL
ALT: 13 IU/L (ref 0–32)
AST: 15 IU/L (ref 0–40)
Albumin/Globulin Ratio: 1.7 (ref 1.2–2.2)
Albumin: 4.3 g/dL (ref 3.9–4.9)
Alkaline Phosphatase: 118 IU/L (ref 44–121)
BUN/Creatinine Ratio: 12 (ref 9–23)
BUN: 8 mg/dL (ref 6–24)
Bilirubin Total: 0.3 mg/dL (ref 0.0–1.2)
CO2: 25 mmol/L (ref 20–29)
Calcium: 9.7 mg/dL (ref 8.7–10.2)
Chloride: 102 mmol/L (ref 96–106)
Creatinine, Ser: 0.66 mg/dL (ref 0.57–1.00)
Globulin, Total: 2.5 g/dL (ref 1.5–4.5)
Glucose: 84 mg/dL (ref 70–99)
Potassium: 4.8 mmol/L (ref 3.5–5.2)
Sodium: 140 mmol/L (ref 134–144)
Total Protein: 6.8 g/dL (ref 6.0–8.5)
eGFR: 111 mL/min/{1.73_m2} (ref >59)

## 2022-07-02 LAB — LIPASE: Lipase: 22 U/L (ref 14–72)

## 2022-07-04 ENCOUNTER — Inpatient Hospital Stay: Admission: RE | Admit: 2022-07-04 | Payer: 59 | Source: Ambulatory Visit

## 2022-07-04 ENCOUNTER — Other Ambulatory Visit: Payer: Self-pay | Admitting: Physician Assistant

## 2022-07-04 DIAGNOSIS — R1013 Epigastric pain: Secondary | ICD-10-CM

## 2022-07-08 ENCOUNTER — Ambulatory Visit
Admission: RE | Admit: 2022-07-08 | Discharge: 2022-07-08 | Disposition: A | Discharge status: Home / Self Care | Payer: 59 | Source: Ambulatory Visit | Attending: Physician Assistant | Admitting: Physician Assistant

## 2022-07-08 DIAGNOSIS — R1013 Epigastric pain: Secondary | ICD-10-CM | POA: Diagnosis present

## 2022-07-11 ENCOUNTER — Other Ambulatory Visit: Payer: Self-pay | Admitting: Family Medicine

## 2022-07-11 ENCOUNTER — Institutional Professional Consult (permissible substitution): Payer: 59 | Admitting: Neurology

## 2022-07-11 DIAGNOSIS — N200 Calculus of kidney: Secondary | ICD-10-CM

## 2022-07-11 NOTE — Telephone Encounter (Signed)
Michiel Cowboy A, PA-C  You58 minutes ago (10:03 AM)    Because of her Ehlers-Danlos, she is also at an increase risk of vascular issues.  I would have her reach out to  her PCP or her Ehlers-Danlos specialist to see if they need to do a CT or ultrasound to look at her vessels to make sure she is not having an aneurysm.     McGowan, Shannon A, PA-C  You1 hour ago (9:58 AM)    We can get another KUB.  Please put in the orders.      Patient notified and order for KUB is in. She will get x-ray then reach out to PCP after.

## 2022-07-12 ENCOUNTER — Other Ambulatory Visit: Payer: 59

## 2022-07-15 ENCOUNTER — Ambulatory Visit
Admission: RE | Admit: 2022-07-15 | Discharge: 2022-07-15 | Disposition: A | Discharge status: Home / Self Care | Payer: 59 | Attending: Urology | Admitting: Urology

## 2022-07-15 ENCOUNTER — Ambulatory Visit
Admission: RE | Admit: 2022-07-15 | Discharge: 2022-07-15 | Disposition: A | Discharge status: Home / Self Care | Payer: 59 | Source: Ambulatory Visit | Attending: Urology | Admitting: Urology

## 2022-07-15 DIAGNOSIS — N200 Calculus of kidney: Secondary | ICD-10-CM | POA: Diagnosis present

## 2022-07-16 ENCOUNTER — Ambulatory Visit: Payer: 59 | Admitting: Urology

## 2022-07-16 VITALS — BP 120/83 | HR 101 | Temp 98.0°F | Ht 67.0 in | Wt 231.0 lb

## 2022-07-16 DIAGNOSIS — N200 Calculus of kidney: Secondary | ICD-10-CM | POA: Diagnosis not present

## 2022-07-16 DIAGNOSIS — R102 Pelvic and perineal pain: Secondary | ICD-10-CM | POA: Diagnosis not present

## 2022-07-16 DIAGNOSIS — R310 Gross hematuria: Secondary | ICD-10-CM | POA: Diagnosis not present

## 2022-07-16 LAB — URINALYSIS, COMPLETE
Bilirubin, UA: NEGATIVE
Glucose, UA: NEGATIVE
Ketones, UA: NEGATIVE
Leukocytes,UA: NEGATIVE
Nitrite, UA: NEGATIVE
Protein,UA: NEGATIVE
Specific Gravity, UA: 1.01 (ref 1.005–1.030)
Urobilinogen, Ur: 0.2 mg/dL (ref 0.2–1.0)
pH, UA: 5.5 (ref 5.0–7.5)

## 2022-07-16 LAB — MICROSCOPIC EXAMINATION: RBC, Urine: >30 /hpf — AB (ref 0–2)

## 2022-07-16 NOTE — Progress Notes (Unsigned)
07/16/2022 3:53 PM   [name] 1981/01/04 [id number]  Referring provider: Alfredia Ferguson, PA-C [address] [place],  [place] [address]  Urological history: 1. Nephrolithiasis -ESWL x 4 and URS x 1 -Stone analysis shows 5% calcium oxalate dihydrate, 45% calcium oxalate monohydrate, and 50% calcium phosphate -CT renal stone study (10/2021) - three non obstructing calculi in the RIGHT kidney measuring 1-3 mm. RIGHT ureterolithiasis or obstructive uropathy - four nonobstructing calculi in the LEFT kidney ranging size from 2-4 mm.   2. Intermediate risk hematuria -former smoker -cysto 10/2019 NED -CTU 08/2020 - Bilateral nonobstructive nephrolithiasis  Chief Complaint  Patient presents with   Follow-up   Nephrolithiasis     HPI: [name] is a 42 y.o. female who presents today gross hematuria and pain.   At her visit on 06/18/2022, she was experiencing a heavy feeling in her pelvic area for the last several months.  She felt like she was having a menstrual cycle even though she no longer has a uterus.  She was experiencing urgency, but when she sat to void, she needed to strain to relieve herself.  She denied any vaginal bulge.  She also mentioned that her daughter is experiencing the same phenomenon.  She also experiences stress incontinence when she has upper respiratory infection and with the cough or sneezing.  She was having left back pain, but she feels it is secondary to her EDS.  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  KUB bilateral nephrolithiasis w/ possible right proximal stone.  She was not having right flank pain, so she decided to come back at a later time for a repeat KUB.    She has since contacted the office stating she had pain and  she had an Xray yesterday.  It did not show any ureteral calculi.    She messaged this morning stating that the pain is now greatly increased and she is having gross hematuria.   The pain started in the left flank area and it has now radiated down to the left lower quadrant.  It is crampy in nature.  She has been experiencing intense nausea.  Patient denies any modifying or aggravating factors.  Patient denies any dysuria or suprapubic/flank pain.  Patient denies any fevers, chills or vomiting.    UA pink slightly cloudy, specific gravity 1.010, 3+ blood, pH 5.5, 3+ blood, pH 5.5, 0-5 WBCs, greater than 30 RBCs, 0-10 epithelial cells and a few bacteria.  PMH: Past Medical History:  Diagnosis Date   Acute pyelonephritis 06/29/2012   Anxiety    Arthritis    Asthma    AS A CHILD   Complication of anesthesia    TROUBLE URINATING IN THE PAST   Ehlers-Danlos syndrome    Gallstones    GERD (gastroesophageal reflux disease)    History of kidney stones    Kidney stones    bilateral   Migraine    Pelvic pain in female    PONV (postoperative nausea and vomiting)    WITH HYSTERECTOMY ONLY   Pyelonephritis 2015   Renal mass 2017   right. considered a normal cyst on kidney.   Tobacco abuse     Surgical History: Past Surgical History:  Procedure Laterality Date   ABDOMINAL HYSTERECTOMY  2006  ANKLE ARTHROSCOPY Left 2017   ANKLE ARTHROSCOPY Left 11/24/2015   APPENDECTOMY  07/26/13   CAST APPLICATION  12/25/2011   Procedure: CAST APPLICATION;  Surgeon: Javier Docker, MD;  Location: [facility];  Service: Orthopedics;  Laterality: Left;   CHOLECYSTECTOMY  03/11/2011   Procedure: LAPAROSCOPIC CHOLECYSTECTOMY WITH INTRAOPERATIVE CHOLANGIOGRAM;  Surgeon: Atilano Ina, MD;  Location: [facility] OR;  Service: General;  Laterality: N/A;   CHOLECYSTECTOMY, LAPAROSCOPIC     CYSTOSCOPY/URETEROSCOPY/HOLMIUM LASER/STENT PLACEMENT Left 06/18/2016   Procedure: CYSTOSCOPY/URETEROSCOPY/HOLMIUM LASER/STENT PLACEMENT;   Surgeon: Vanna Scotland, MD;  Location: [facility];  Service: Urology;  Laterality: Left;   ESOPHAGOGASTRODUODENOSCOPY (EGD) WITH PROPOFOL N/A 05/15/2022   Procedure: ESOPHAGOGASTRODUODENOSCOPY (EGD) WITH PROPOFOL;  Surgeon: Wyline Mood, MD;  [facility] END[facility];  Service: Gastroenterology;  Laterality: N/A;   LAPAROSCOPIC OVARIAN CYSTECTOMY Right 09/02/2017   Procedure: LAPAROSCOPIC OVARIAN CYSTECTOMY;  Surgeon: Nadara Mustard, MD;  Location: [facility] ORS;  Service: Gynecology;  Laterality: Right;   LITHOTRIPSY     x 4   ORIF ANKLE FRACTURE  12/25/2011   Procedure: OPEN REDUCTION INTERNAL FIXATION (ORIF) ANKLE FRACTURE;  Surgeon: Javier Docker, MD;  Location: [facility];  Service: Orthopedics;  Laterality: Left;   TONSILLECTOMY      Home Medications:  Allergies as of 07/16/2022       Reactions   Compazine Other (See Comments)   Dystonia   Metoclopramide Other (See Comments)   dystonia        Medication List        Accurate as of Jul 16, 2022 11:59 PM. If you have any questions, ask your nurse or doctor.          STOP taking these medications    Nurtec 75 MG Tbdp Generic drug: Rimegepant Sulfate Stopped by: Kairi Tufo, PA-C       TAKE these medications    cyclobenzaprine 10 MG tablet Commonly known as: FLEXERIL Take 10 mg by mouth 3 (three) times daily as needed for muscle spasms.   gabapentin 800 MG tablet Commonly known as: NEURONTIN Take 800 mg by mouth 3 (three) times daily.   methylphenidate 5 MG tablet Commonly known as: RITALIN Take 5 mg by mouth as needed.   multivitamin with minerals Tabs tablet Take 1 tablet by mouth daily.   omeprazole 40 MG capsule Commonly known as: PRILOSEC Take 1 capsule (40 mg total) by mouth daily.   Oxycodone HCl 20 MG Tabs Take 1 tablet by mouth 4 (four) times daily as needed.   polyethylene glycol 17 g packet Commonly known as: MIRALAX / GLYCOLAX Take 17 g by mouth every other day.   rizatriptan 10 MG  tablet Commonly known as: MAXALT TAKE ONE TABLET AS NEEDED FOR MIGRAINE. MAY REPEAT IN TWO HOURS IF NEEDED   sucralfate 1 g tablet Commonly known as: Carafate Take 1 tablet (1 g total) by mouth 4 (four) times daily -  with meals and at bedtime.   valACYclovir 500 MG tablet Commonly known as: VALTREX Take 2 tablets (1,000 mg total) by mouth 2 (two) times daily as needed (cold sores).   Vitamin D (Ergocalciferol) 1.25 MG (50000 UNIT) Caps capsule Commonly known as: DRISDOL TAKE 1 CAPSULE (50,000 UNITS) BY MOUTH EVERY 7 DAYS        Allergies:  Allergies  Allergen Reactions   Compazine Other (See Comments)    Dystonia   Metoclopramide Other (See Comments)    dystonia    Family History: Family History  Problem Relation Age  of Onset   Diabetes Mother    Diabetes Father    Heart failure Father    Hypertension Father    Pulmonary fibrosis Father        Secondary to amiodarone   Nephrolithiasis Father    Arrhythmia Father        Suspected ventricular tachycardia/fibrillation   Other Brother        Hypermobile joints   Kidney cancer Neg Hx    Bladder Cancer Neg Hx     Social History:  reports that she has been smoking cigarettes. She started smoking about 22 years ago. She has a 7.50 pack-year smoking history. She has never used smokeless tobacco. She reports that she does not currently use alcohol. She reports that she does not use drugs.  ROS: Pertinent ROS in HPI  Physical Exam: BP 120/83   Pulse (!) 101   Temp 98 F (36.7 C)   Ht 5\' 7"  (1.702 m)   Wt 231 lb (104.8 kg)   BMI 36.18 kg/m   Constitutional:  Well nourished. Alert and oriented, No acute distress. HEENT: [place] AT, moist mucus membranes.  Trachea midline Cardiovascular: No clubbing, cyanosis, or edema. Respiratory: Normal respiratory effort, no increased work of breathing. Neurologic: Grossly intact, no focal deficits, moving all 4 extremities. Psychiatric: Normal mood and affect.    Laboratory  Data: Urinalysis See Epic and HPI. I have reviewed the labs.   Pertinent Imaging: KUB bilateral stones w/ possible right proximal stone I have independently reviewed the films.  See HPI.  Radiologist interpretation still pending  Assessment & Plan:    1. High risk hematuria -CT Urogram with bilateral nephrolithiasis 08/2020 -cysto NED 10/2019 -no reports of gross heme -UA > 30 RBC's  -urine culture pending -She continues to be symptomatic, with gross hematuria and her pain is on the left side, I will go ahead and repeat a CT urogram for further evaluation tomorrow and will call her with results   2.  Nephrolithiasis -bilateral nephrolithiasis on today's KUB w/ possible right proximal stone -she is currently not having right flank pain -CT urogram is pending  3. Pelvic pain/LLQ pain -CT urogram is pending  Return for pending CT urogram results .  These notes generated with voice recognition software. I apologize for typographical errors.  Cloretta Ned  [facility] [address]  [address] [place], [place] [address] [phone]

## 2022-07-17 ENCOUNTER — Ambulatory Visit
Admission: RE | Admit: 2022-07-17 | Discharge: 2022-07-17 | Disposition: A | Discharge status: Home / Self Care | Payer: 59 | Source: Ambulatory Visit | Attending: Urology | Admitting: Urology

## 2022-07-17 ENCOUNTER — Other Ambulatory Visit: Payer: 59 | Admitting: Urology

## 2022-07-17 DIAGNOSIS — R310 Gross hematuria: Secondary | ICD-10-CM | POA: Diagnosis present

## 2022-07-17 DIAGNOSIS — N200 Calculus of kidney: Secondary | ICD-10-CM | POA: Insufficient documentation

## 2022-07-17 MED ORDER — IOHEXOL 300 MG/ML  SOLN
100.0000 mL | Freq: Once | INTRAMUSCULAR | Status: AC | PRN
  Administered 2022-07-17: 100 mL via INTRAVENOUS

## 2022-07-18 ENCOUNTER — Encounter: Payer: Self-pay | Admitting: Urology

## 2022-07-19 LAB — CULTURE, URINE COMPREHENSIVE

## 2022-07-22 NOTE — Telephone Encounter (Signed)
Pharmacy Patient Advocate Encounter  Received notification from [employer] that the request for prior authorization for Nurtec 75MG  dispersible tablets has been denied due to see below.      Please be advised we currently do not have a Pharmacist to review denials, therefore you will need to process appeals accordingly as needed. Thanks for your support at this time.   You may fax [phone], to appeal.

## 2022-07-23 ENCOUNTER — Encounter: Payer: Self-pay | Admitting: Urology

## 2022-07-23 ENCOUNTER — Ambulatory Visit (INDEPENDENT_AMBULATORY_CARE_PROVIDER_SITE_OTHER): Payer: 59 | Admitting: Urology

## 2022-07-23 VITALS — BP 135/66 | HR 94 | Ht 67.0 in | Wt 230.0 lb

## 2022-07-23 DIAGNOSIS — R3129 Other microscopic hematuria: Secondary | ICD-10-CM

## 2022-07-23 DIAGNOSIS — N200 Calculus of kidney: Secondary | ICD-10-CM

## 2022-07-23 LAB — MICROSCOPIC EXAMINATION

## 2022-07-23 LAB — URINALYSIS, COMPLETE
Bilirubin, UA: NEGATIVE
Glucose, UA: NEGATIVE
Ketones, UA: NEGATIVE
Leukocytes,UA: NEGATIVE
Nitrite, UA: NEGATIVE
Protein,UA: NEGATIVE
Specific Gravity, UA: 1.015 (ref 1.005–1.030)
Urobilinogen, Ur: 0.2 mg/dL (ref 0.2–1.0)
pH, UA: 7 (ref 5.0–7.5)

## 2022-07-23 NOTE — Progress Notes (Signed)
   07/23/22  CC:  Chief Complaint  Patient presents with   Cysto    HPI: 42 year old female with history of recurrent kidney stones who presents today for cystoscopy for hematuria evaluation.  She did have a CT urogram on 07/17/2022 which showed bilateral stone burden up to 5 mm, all nonobstructing.  She does report that a few days after her CT scan, she passed a very small stone.  She not have any pain or pressure since then.  Blood pressure 135/66, pulse 94, height 5\' 7"  (1.702 m), weight 230 lb (104.3 kg). NED. A&Ox3.   No respiratory distress   Abd soft, NT, ND Normal external genitalia with patent urethral meatus  Cystoscopy Procedure Note  Patient identification was confirmed, informed consent was obtained, and patient was prepped using Betadine solution.  Lidocaine jelly was administered per urethral meatus.    Procedure: - Flexible cystoscope introduced, without any difficulty.   - Thorough search of the bladder revealed:    normal urethral meatus    normal urothelium    no stones    no ulcers     no tumors    no urethral polyps    no trabeculation  - Ureteral orifices were normal in position and appearance.  Post-Procedure: - Patient tolerated the procedure well  Assessment/ Plan:  1. Microscopic hematuria Likely related to recent stone event  Cystoscopy is unremarkable - Urinalysis, Complete  2. Nephrolithiasis Stones relatively small, nonobstructing  Continue stone prevention techniques as previously discussed  Follow-up in 1 year with KUB - Abdomen 1 view (KUB); Future   Vanna Scotland, MD

## 2022-08-19 ENCOUNTER — Ambulatory Visit (INDEPENDENT_AMBULATORY_CARE_PROVIDER_SITE_OTHER): Payer: 59 | Admitting: Neurology

## 2022-08-19 ENCOUNTER — Encounter: Payer: Self-pay | Admitting: Neurology

## 2022-08-19 VITALS — BP 115/78 | HR 81 | Ht 67.0 in | Wt 233.0 lb

## 2022-08-19 DIAGNOSIS — R519 Headache, unspecified: Secondary | ICD-10-CM

## 2022-08-19 DIAGNOSIS — Z9189 Other specified personal risk factors, not elsewhere classified: Secondary | ICD-10-CM | POA: Diagnosis not present

## 2022-08-19 DIAGNOSIS — G4719 Other hypersomnia: Secondary | ICD-10-CM | POA: Diagnosis not present

## 2022-08-19 DIAGNOSIS — G4700 Insomnia, unspecified: Secondary | ICD-10-CM | POA: Diagnosis not present

## 2022-08-19 DIAGNOSIS — R0683 Snoring: Secondary | ICD-10-CM | POA: Diagnosis not present

## 2022-08-19 DIAGNOSIS — E669 Obesity, unspecified: Secondary | ICD-10-CM

## 2022-08-19 NOTE — Patient Instructions (Signed)

## 2022-08-19 NOTE — Progress Notes (Signed)
Subjective:    Patient ID: [name] is a 42 y.o. female.  HPI    Sophia Foley, MD, PhD [facility] [address], [address] [address] [place], [place] [address]  Dear [name],  I saw your patient, [name], upon your kind request in my sleep clinic today for initial consultation of her sleep disorder, in particular, concern for underlying obstructive sleep apnea.  The patient is unaccompanied today.  As you know, [name] is a 42 year old female with an underlying medical history of migraine headaches, arthritis, asthma, Ehlers-Danlos syndrome, reflux disease, history of kidney stones, with status post multiple lithotripsy procedures, cholelithiasis with status post cholecystectomy, left ankle fracture with status post ORIF, appendectomy, tonsillectomy, smoking, and obesity, who reports snoring and excessive daytime somnolence as well as morning headaches.  She has had fatigue for years.  Her Epworth sleepiness score is 11 out of 24, fatigue severity score is 63 out of 63.  I reviewed your office note from 05/24/2022.  Of note, she is on several medications including several potentially sedating medications such as oxycodone as needed, Flexeril, high-dose gabapentin, and she also takes Ritalin.  Bedtime is generally around 10:30 PM and rise time around 5:30 AM.  She does nap occasionally when she has time.  She lives with her husband and her 49 year old daughter.  Her 44 year old son is married and on his own.  They have 1 cat in the household normally but currently her daughter and daughter's significant other have 3 dogs and 1 cat also.  Patient has a TV on in the bedroom at night and it tends to be on, she may put it on a 1 hour sleep timer.  She had a tonsillectomy at age 76.  Her weight has been more or less stable.  She has nocturnal reflux symptoms and coughing at night.  She drinks caffeine in the form of coffee, usually 1 cup in the morning, alcohol  infrequently, on special occasions, she smokes about half a pack per day and is working on smoking cessation.  She works as a Print production planner for a [employer]. She has some difficulty going to sleep and staying asleep.  Her Past Medical History Is Significant For: Past Medical History:  Diagnosis Date   Acute pyelonephritis 06/29/2012   Anxiety    Arthritis    Asthma    AS A CHILD   Complication of anesthesia    TROUBLE URINATING IN THE PAST   Ehlers-Danlos syndrome    Gallstones    GERD (gastroesophageal reflux disease)    History of kidney stones    Kidney stones    bilateral   Migraine    Pelvic pain in female    PONV (postoperative nausea and vomiting)    WITH HYSTERECTOMY ONLY   Pyelonephritis 2015   Renal mass 2017   right. considered a normal cyst on kidney.   Tobacco abuse     Her Past Surgical History Is Significant For: Past Surgical History:  Procedure Laterality Date   ABDOMINAL HYSTERECTOMY  2006   ANKLE ARTHROSCOPY Left 2017   ANKLE ARTHROSCOPY Left 11/24/2015   APPENDECTOMY  07/26/13   CAST APPLICATION  12/25/2011   Procedure: CAST APPLICATION;  Surgeon: Javier Docker, MD;  Location: [facility];  Service: Orthopedics;  Laterality: Left;   CHOLECYSTECTOMY  03/11/2011   Procedure: LAPAROSCOPIC CHOLECYSTECTOMY WITH INTRAOPERATIVE CHOLANGIOGRAM;  Surgeon: Atilano Ina, MD;  Location: [facility] OR;  Service: General;  Laterality: N/A;  CHOLECYSTECTOMY, LAPAROSCOPIC     CYSTOSCOPY/URETEROSCOPY/HOLMIUM LASER/STENT PLACEMENT Left 06/18/2016   Procedure: CYSTOSCOPY/URETEROSCOPY/HOLMIUM LASER/STENT PLACEMENT;  Surgeon: Vanna Scotland, MD;  Location: [facility] ORS;  Service: Urology;  Laterality: Left;   ESOPHAGOGASTRODUODENOSCOPY (EGD) WITH PROPOFOL N/A 05/15/2022   Procedure: ESOPHAGOGASTRODUODENOSCOPY (EGD) WITH PROPOFOL;  Surgeon: Wyline Mood, MD;  Location: [facility] ENDOSCOPY;  Service: Gastroenterology;  Laterality: N/A;   LAPAROSCOPIC OVARIAN CYSTECTOMY Right 09/02/2017    Procedure: LAPAROSCOPIC OVARIAN CYSTECTOMY;  Surgeon: Nadara Mustard, MD;  Location: [facility] ORS;  Service: Gynecology;  Laterality: Right;   LITHOTRIPSY     x 4   ORIF ANKLE FRACTURE  12/25/2011   Procedure: OPEN REDUCTION INTERNAL FIXATION (ORIF) ANKLE FRACTURE;  Surgeon: Javier Docker, MD;  Location: [facility];  Service: Orthopedics;  Laterality: Left;   TONSILLECTOMY      Her Family History Is Significant For: Family History  Problem Relation Age of Onset   Diabetes Mother    Diabetes Father    Heart failure Father    Hypertension Father    Pulmonary fibrosis Father        Secondary to amiodarone   Nephrolithiasis Father    Arrhythmia Father        Suspected ventricular tachycardia/fibrillation   Other Brother        Hypermobile joints   Kidney cancer Neg Hx    Bladder Cancer Neg Hx     Her Social History Is Significant For: Social History   Socioeconomic History   Marital status: Married    Spouse name: [name]   Number of children: Not on file   Years of education: Not on file   Highest education level: Not on file  Occupational History   Not on file  Tobacco Use   Smoking status: Every Day    Packs/day: 0.50    Years: 15.00    Additional pack years: 0.00    Total pack years: 7.50    Types: Cigarettes    Start date: 11/03/1999    Last attempt to quit: 09/26/2014    Years since quitting: 7.9   Smokeless tobacco: Never   Tobacco comments:    Patient started smoking again in the past year  Vaping Use   Vaping Use: Never used  Substance and Sexual Activity   Alcohol use: Not Currently    Alcohol/week: 0.0 standard drinks of alcohol   Drug use: No   Sexual activity: Yes    Birth control/protection: Surgical    Comment: Hysterectomy   Other Topics Concern   Not on file  Social History Narrative   Not on file   Social Determinants of Health   Financial Resource Strain: Not on file  Food Insecurity: Not on file  Transportation Needs: Not on file  Physical  Activity: Not on file  Stress: Not on file  Social Connections: Not on file    Her Allergies Are:  Allergies  Allergen Reactions   Compazine Other (See Comments)    Dystonia   Metoclopramide Other (See Comments)    dystonia  :   Her Current Medications Are:  Outpatient Encounter Medications as of 08/19/2022  Medication Sig   cyclobenzaprine (FLEXERIL) 10 MG tablet Take 10 mg by mouth 3 (three) times daily as needed for muscle spasms.   gabapentin (NEURONTIN) 800 MG tablet Take 800 mg by mouth 3 (three) times daily.   methylphenidate (RITALIN) 5 MG tablet Take 5 mg by mouth as needed.   Multiple Vitamin (MULTIVITAMIN WITH MINERALS) TABS tablet Take 1 tablet  by mouth daily.   omeprazole (PRILOSEC) 40 MG capsule Take 1 capsule (40 mg total) by mouth daily.   Oxycodone HCl 20 MG TABS Take 1 tablet by mouth 4 (four) times daily as needed.   polyethylene glycol (MIRALAX / GLYCOLAX) packet Take 17 g by mouth every other day.    rizatriptan (MAXALT) 10 MG tablet TAKE ONE TABLET AS NEEDED FOR MIGRAINE. MAY REPEAT IN TWO HOURS IF NEEDED   sucralfate (CARAFATE) 1 g tablet Take 1 tablet (1 g total) by mouth 4 (four) times daily -  with meals and at bedtime.   valACYclovir (VALTREX) 500 MG tablet Take 2 tablets (1,000 mg total) by mouth 2 (two) times daily as needed (cold sores).   Vitamin D, Ergocalciferol, (DRISDOL) 1.25 MG (50000 UNIT) CAPS capsule TAKE 1 CAPSULE (50,000 UNITS) BY MOUTH EVERY 7 DAYS   No facility-administered encounter medications on file as of 08/19/2022.  :   Review of Systems:  Out of a complete 14 point review of systems, all are reviewed and negative with the exception of these symptoms as listed below:   Review of Systems  Neurological:        Pt here for sleep consult Pt states little snoring,fatigue,migraines  Pt denies hypertension ,sleep study,CPAP machine     ESS:11 FSS:63     Objective:  Neurological Exam  Physical Exam Physical Examination:    Vitals:   08/19/22 1224  BP: 115/78  Pulse: 81    General Examination: The patient is a very pleasant 42 y.o. female in no acute distress. She appears well-developed and well-nourished and well groomed.   HEENT: Normocephalic, atraumatic, pupils are equal, round and reactive to light, extraocular tracking is good without limitation to gaze excursion or nystagmus noted. Hearing is grossly intact. Face is symmetric with normal facial animation. Speech is clear with no dysarthria noted. There is no hypophonia. There is no lip, neck/head, jaw or voice tremor. Neck is supple with full range of passive and active motion. There are no carotid bruits on auscultation. Oropharynx exam reveals: mild mouth dryness, good dental hygiene and mild airway crowding, due to small airway entry, Mallampati is class I, tonsils absent, minimal overbite noted.  Tongue protrudes centrally and palate elevates symmetrically.  Neck circumference 14 three-quarter inches.   Chest: Clear to auscultation without wheezing, rhonchi or crackles noted.  Heart: S1+S2+0, regular and normal without murmurs, rubs or gallops noted.   Abdomen: Soft, non-tender and non-distended.  Extremities: There is no pitting edema in the distal lower extremities bilaterally.   Skin: Warm and dry without trophic changes noted.   Musculoskeletal: exam reveals no obvious joint deformities.   Neurologically:  Mental status: The patient is awake, alert and oriented in all 4 spheres. Her immediate and remote memory, attention, language skills and fund of knowledge are appropriate. There is no evidence of aphasia, agnosia, apraxia or anomia. Speech is clear with normal prosody and enunciation. Thought process is linear. Mood is normal and affect is normal.  Cranial nerves II - XII are as described above under HEENT exam.  Motor exam: Normal bulk, strength and tone is noted. There is no obvious action or resting tremor.  Fine motor skills and  coordination: grossly intact.  Cerebellar testing: No dysmetria or intention tremor. There is no truncal or gait ataxia.  Sensory exam: intact to light touch in the upper and lower extremities.  Gait, station and balance: She stands easily. No veering to one side is noted. No leaning  to one side is noted. Posture is age-appropriate and stance is narrow based. Gait shows normal stride length and normal pace. No problems turning are noted.   Assessment and plan:  In summary, [name] is a very pleasant 42 y.o.-year old female with an underlying medical history of migraine headaches, arthritis, asthma, Ehlers-Danlos syndrome, reflux disease, history of kidney stones, with status post multiple lithotripsy procedures, cholelithiasis with status post cholecystectomy, left ankle fracture with status post ORIF, appendectomy, tonsillectomy, smoking, and obesity, whose history and physical exam are concerning for sleep disordered breathing, particularly obstructive sleep apnea (OSA).  While a laboratory attended sleep study is typically considered "gold standard" for evaluation of sleep disordered breathing, the patient would prefer a home sleep test at this time.   I had a long chat with the patient about my findings and the diagnosis of sleep apnea, particularly OSA, its prognosis and treatment options. We talked about medical/conservative treatments, surgical interventions and non-pharmacological approaches for symptom control. I explained, in particular, the risks and ramifications of untreated moderate to severe OSA, especially with respect to developing cardiovascular disease down the road, including congestive heart failure (CHF), difficult to treat hypertension, cardiac arrhythmias (particularly A-fib), neurovascular complications including TIA, stroke and dementia. Even type 2 diabetes has, in part, been linked to untreated OSA. Symptoms of untreated OSA may include (but may not be limited to)  daytime sleepiness, nocturia (i.e. frequent nighttime urination), memory problems, mood irritability and suboptimally controlled or worsening mood disorder such as depression and/or anxiety, lack of energy, lack of motivation, physical discomfort, as well as recurrent headaches, especially morning or nocturnal headaches. We talked about the importance of maintaining a healthy lifestyle and striving for healthy weight.  The importance of complete smoking cessation was also addressed.  In addition, we talked about the importance of striving for and maintaining good sleep hygiene. I recommended a sleep study at this time. I outlined the differences between a laboratory attended sleep study which is considered more comprehensive and accurate over the option of a home sleep test (HST); the latter may lead to underestimation of sleep disordered breathing in some instances and does not help with diagnosing upper airway resistance syndrome and is not accurate enough to diagnose primary central sleep apnea typically. I outlined possible surgical and non-surgical treatment options of OSA, including the use of a positive airway pressure (PAP) device (i.e. CPAP, AutoPAP/APAP or BiPAP in certain circumstances), a custom-made dental device (aka oral appliance, which would require a referral to a specialist dentist or orthodontist typically, and is generally speaking not considered for patients with full dentures or edentulous state), upper airway surgical options, such as traditional UPPP (which is not considered a first-line treatment) or the Inspire device (hypoglossal nerve stimulator, which would involve a referral for consultation with an ENT surgeon, after careful selection, following inclusion criteria - also not first-line treatment). I explained the PAP treatment option to the patient in detail, as this is generally considered first-line treatment.  The patient indicated that she would be willing to try PAP therapy, if  the need arises. I explained the importance of being compliant with PAP treatment, not only for insurance purposes but primarily to improve patient's symptoms symptoms, and for the patient's long term health benefit, including to reduce Her cardiovascular risks longer-term.    We will pick up our discussion about the next steps and treatment options after testing.  We will keep her posted as to the test results by phone call  and/or MyChart messaging where possible.  We will plan to follow-up in sleep clinic accordingly as well.  I answered all her questions today and the patient was in agreement.   I encouraged her to call with any interim questions, concerns, problems or updates or email [place] through [place].  Generally speaking, sleep test authorizations may take up to 2 weeks, sometimes less, sometimes longer, the patient is encouraged to get in touch with [place] if they do not hear back from the sleep lab staff directly within the next 2 weeks.  Thank you very much for allowing me to participate in the care of this nice patient. If I can be of any further assistance to you please do not hesitate to call me at [phone].  Sincerely,   Star Age, MD, PhD

## 2022-08-28 ENCOUNTER — Other Ambulatory Visit: Payer: Self-pay | Admitting: Physician Assistant

## 2022-08-28 DIAGNOSIS — M47896 Other spondylosis, lumbar region: Secondary | ICD-10-CM

## 2022-08-30 ENCOUNTER — Other Ambulatory Visit: Payer: Self-pay | Admitting: Physician Assistant

## 2022-08-30 DIAGNOSIS — M47896 Other spondylosis, lumbar region: Secondary | ICD-10-CM

## 2022-10-07 ENCOUNTER — Encounter: Payer: Self-pay | Admitting: Physician Assistant

## 2022-10-07 ENCOUNTER — Other Ambulatory Visit: Payer: Self-pay

## 2022-10-07 ENCOUNTER — Encounter: Payer: Self-pay | Admitting: Gastroenterology

## 2022-10-07 ENCOUNTER — Ambulatory Visit (INDEPENDENT_AMBULATORY_CARE_PROVIDER_SITE_OTHER): Payer: 59 | Admitting: Gastroenterology

## 2022-10-07 VITALS — BP 116/83 | HR 88 | Temp 98.3°F | Wt 236.0 lb

## 2022-10-07 DIAGNOSIS — R1319 Other dysphagia: Secondary | ICD-10-CM

## 2022-10-07 DIAGNOSIS — F419 Anxiety disorder, unspecified: Secondary | ICD-10-CM

## 2022-10-07 DIAGNOSIS — K219 Gastro-esophageal reflux disease without esophagitis: Secondary | ICD-10-CM

## 2022-10-07 NOTE — Progress Notes (Signed)
Wyline Mood MD, MRCP(U.K) [address]  [address]  [place], [place] [address]  Main: [phone]  Fax: [phone]   Primary Care Physician: Alfredia Ferguson, PA-C  Primary Gastroenterologist:  Dr. Wyline Mood   Chief Complaint  Patient presents with   Dysphagia    HPI: [name] is a 42 y.o. female  Summary of history :  Initially referred and see in 04/2022 for dysphagia. She has a history of Erler Danlos syndrome . she has always had difficulty swallowing since many years but is gradually got worse can affect liquids as well as solids. She may regurgitate at times and she may cough during meals at times. She also complains of early satiety and feels full very easily does complain of heartburn on Prilosec 20 mg a day with symptoms not very well-controlled. She also complains of chronic constipation which she takes MiraLAX on and off. Denies any weight loss.   Interval history 04/2022-10/07/2022  05/06/2022: Barium swallow with barium tablet GE junction narrowed 13 mm barium tablet did not pass.  Esophageal dysmotility, small volume reflux.  05/13/2022 modified barium swallow no further skilled speech therapy needs identified.  No laryngeal penetration or aspiration noted.  There is some retention in the lower cervical middle esophagus with thicker consistencies.   3/20/204: EGD: Abnormal esophageal motility noted, no stricture but empirically dilated to 18 mm , bx of esophagus showed BENIGN SQUAMOUS MUCOSA WITH ACANTHOSIS, SPONGIOSIS, AND ACTIVE  ESOPHAGITIS   Since the endoscopy no significant change in her swallowing.  If she eats too fast feels like the food does not go down well.  She increased her Prilosec from 20 to 40 mg a day and has noted a significant improvement in her reflux symptoms such as regurgitation.  No other complaints.  Current Outpatient Medications  Medication Sig Dispense Refill   amphetamine-dextroamphetamine (ADDERALL) 5 MG tablet Take 1  tablet by mouth daily.     cyclobenzaprine (FLEXERIL) 10 MG tablet Take 10 mg by mouth 3 (three) times daily as needed for muscle spasms.     gabapentin (NEURONTIN) 800 MG tablet Take 800 mg by mouth 3 (three) times daily.     Multiple Vitamin (MULTIVITAMIN WITH MINERALS) TABS tablet Take 1 tablet by mouth daily.     omeprazole (PRILOSEC) 40 MG capsule Take 1 capsule (40 mg total) by mouth daily. 90 capsule 3   Oxycodone HCl 20 MG TABS Take 1 tablet by mouth 4 (four) times daily as needed.     polyethylene glycol (MIRALAX / GLYCOLAX) packet Take 17 g by mouth every other day.      rizatriptan (MAXALT) 10 MG tablet TAKE ONE TABLET AS NEEDED FOR MIGRAINE. MAY REPEAT IN TWO HOURS IF NEEDED 10 tablet 5   valACYclovir (VALTREX) 500 MG tablet Take 2 tablets (1,000 mg total) by mouth 2 (two) times daily as needed (cold sores). 20 tablet 5   Vitamin D, Ergocalciferol, (DRISDOL) 1.25 MG (50000 UNIT) CAPS capsule TAKE 1 CAPSULE (50,000 UNITS) BY MOUTH EVERY 7 DAYS 12 capsule 3   XTAMPZA ER 9 MG C12A Take 1 capsule by mouth 2 (two) times daily.     No current facility-administered medications for this visit.    Allergies as of 10/07/2022 - Review Complete 10/07/2022  Allergen Reaction Noted   Compazine Other (See Comments) 01/19/2011   Metoclopramide Other (See Comments)       ROS:  General: Negative for anorexia, weight loss, fever, chills, fatigue, weakness. ENT: Negative for  hoarseness, difficulty swallowing , nasal congestion. CV: Negative for chest pain, angina, palpitations, dyspnea on exertion, peripheral edema.  Respiratory: Negative for dyspnea at rest, dyspnea on exertion, cough, sputum, wheezing.  GI: See history of present illness. GU:  Negative for dysuria, hematuria, urinary incontinence, urinary frequency, nocturnal urination.  Endo: Negative for unusual weight change.    Physical Examination:   BP 116/83   Pulse 88   Temp 98.3 F (36.8 C) (Oral)   Wt 236 lb (107 kg)   BMI  36.96 kg/m   General: Well-nourished, well-developed in no acute distress.  Eyes: No icterus. Conjunctivae pink. Neuro: Alert and oriented x 3.  Grossly intact. Skin: Warm and dry, no jaundice.   Psych: Alert and cooperative, normal mood and affect.   Imaging Studies: No results found.  Assessment and Plan:   [name] is a 42 y.o. y/o female with a recent diagnosis of Ehlers-Danlos syndrome.  Clinically she had some symptoms of transfer dysphagia but none seen on modified barium swallow.  EGD demonstrated no clear stricture although her GE junction was dilated to 18 mm and the balloon was pulled through.  On biopsies esophagus no evidence of EOE.  There is some features suggestive of reflux related inflammation.  Decreased motility seen in the esophageal body.  Discussed to increase Prilosec to 40 mg twice a day to see if we can maximize treatment for reflux and see if it helps with her swallowing process if it does not help drop to 40 mg once a day.  Explained that we can do manometric testing but it would not change my management.  She does not have any evidence of achalasia.  There are no medications to improve esophageal motility.  Suggest to keep head of the bed elevated avoid eating large meals before bedtime using a wedge pillow losing weight as part of management for long-term acid reflux.  Discussed the risks versus benefits of long-term PPI use including but not limited to thinning of the bones increased risk of infection, kidney disease weighing the risk versus benefits aim is to put her on the lowest dose required for the shortness.  Of time.       Dr Wyline Mood  MD,MRCP [facility]) Follow up in 1 year

## 2022-10-07 NOTE — Patient Instructions (Addendum)

## 2022-10-08 MED ORDER — ALPRAZOLAM 0.5 MG PO TABS: 0.5000 mg | ORAL_TABLET | Freq: Two times a day (BID) | ORAL | 0 refills | Status: DC | PRN

## 2022-11-04 ENCOUNTER — Telehealth: Payer: 59 | Admitting: Diagnostic Neuroimaging

## 2022-11-11 ENCOUNTER — Other Ambulatory Visit: Payer: Self-pay | Admitting: Family Medicine

## 2022-11-11 DIAGNOSIS — F419 Anxiety disorder, unspecified: Secondary | ICD-10-CM

## 2022-11-15 ENCOUNTER — Other Ambulatory Visit: Payer: Self-pay | Admitting: Family Medicine

## 2022-11-15 DIAGNOSIS — F419 Anxiety disorder, unspecified: Secondary | ICD-10-CM

## 2022-11-16 MED ORDER — ALPRAZOLAM 0.5 MG PO TABS: 0.5000 mg | ORAL_TABLET | Freq: Two times a day (BID) | ORAL | 1 refills | Status: DC | PRN

## 2022-11-18 NOTE — Telephone Encounter (Signed)
Requested medication (s) are due for refill today:   Provider to review  Requested medication (s) are on the active medication list:   Yes  Future visit scheduled:   Yes 12/30/2022 with Alfredia Ferguson, PA-C   Last ordered: 11/16/2022 #15, 1 refill  Non delegated refill.  Also former pt of [employer].      Requested Prescriptions  Pending Prescriptions Disp Refills   ALPRAZolam (XANAX) 0.5 MG tablet [Pharmacy Med Name: ALPRAZOLAM 0.5MG  TABLET] 15 tablet 0    Sig: TAKE ONE TABLET (0.5 MG TOTAL) BY MOUTH TWO TIMES DAILY AS NEEDED FOR ANXIETY.     Not Delegated - Psychiatry: Anxiolytics/Hypnotics 2 Failed - 11/15/2022 12:40 PM      Failed - This refill cannot be delegated      Failed - Urine Drug Screen completed in last 360 days      Passed - Patient is not pregnant      Passed - Valid encounter within last 6 months    Recent Outpatient Visits           4 months ago Epigastric pain   [place] Alfredia Ferguson, PA-C   10 months ago Annual physical exam   [facility] Alfredia Ferguson, PA-C   1 year ago Cyst of left ovary   [place] [facility] Alfredia Ferguson, PA-C   1 year ago Avitaminosis D   [place] [place], [place], PA-C   1 year ago Sinusitis, unspecified chronicity, unspecified location   [facility] Malva Limes, MD       Future Appointments             In 1 month Ok Edwards, Lou [facility]   In 8 months McGowan, Elana [facility]

## 2022-12-04 ENCOUNTER — Encounter: Payer: Self-pay | Admitting: Gastroenterology

## 2022-12-04 ENCOUNTER — Other Ambulatory Visit: Payer: Self-pay

## 2022-12-04 MED ORDER — OMEPRAZOLE 40 MG PO CPDR: 40.0000 mg | DELAYED_RELEASE_CAPSULE | Freq: Two times a day (BID) | ORAL | 3 refills | Status: AC

## 2022-12-30 ENCOUNTER — Encounter: Payer: BC Managed Care – PPO | Admitting: Physician Assistant

## 2023-01-02 NOTE — Progress Notes (Signed)
Complete physical exam   Patient: [name]   DOB: [birth year]   42 y.o. Female  MRN: [id number] Visit Date: 01/03/2023  Today's healthcare provider: Alfredia Ferguson, PA-C   Chief Complaint  Patient presents with   Annual Exam    Would like to talk about a migraine medication- zofran   Subjective    [name] is a 42 y.o. female who presents today for a complete physical exam.  Discussed the use of AI scribe software for clinical note transcription with the patient, who gave verbal consent to proceed.  History of Present Illness   The patient, with a history of hypermobile Ehlers-Danlos Syndrome (EDS) and migraines, presents with multiple complaints.   The patient admits to smoking and expresses a desire to quit, acknowledging the habit as a crutch during stressful periods. The patient has successfully quit twice in the past but relapsed due to the stress of parental loss.  The patient is experiencing menopausal symptoms, including hot flashes and a non-existent sex drive. The patient has consulted a gynecologist and is awaiting lab results. The patient suspects menopause due to symptoms and the presence of only one ovary, which can accelerate the process.  The patient reports a painful lump on the right side of her throat that swells up at least twice a month. The swelling is accompanied by a general feeling of being unwell. Currently asymptomatic.  The patient experiences chest fluttering at least once a week, increasing in frequency, leading to a tight chest and near fainting spells. The spells last about thirty seconds and occur more frequently when sitting/lying down. These episodes feel different than hot flashes.  The patient's feet get extremely cold, causing discomfort. The cold is followed by a burning sensation, particularly in the toes. This has been occurring for the past two weeks.Denies similar severity of symptoms in her hands.   Past Medical History:   Diagnosis Date   Acute pyelonephritis 06/29/2012   Anxiety    Arthritis    Asthma    AS A CHILD   Complication of anesthesia    TROUBLE URINATING IN THE PAST   Ehlers-Danlos syndrome    Gallstones    GERD (gastroesophageal reflux disease)    History of kidney stones    Kidney stones    bilateral   Migraine    Pelvic pain in female    PONV (postoperative nausea and vomiting)    WITH HYSTERECTOMY ONLY   Pyelonephritis 2015   Renal mass 2017   right. considered a normal cyst on kidney.   Tobacco abuse    Past Surgical History:  Procedure Laterality Date   ABDOMINAL HYSTERECTOMY  2006   ANKLE ARTHROSCOPY Left 2017   ANKLE ARTHROSCOPY Left 11/24/2015   APPENDECTOMY  07/26/13   CAST APPLICATION  12/25/2011   Procedure: CAST APPLICATION;  Surgeon: Javier Docker, MD;  Location: [facility];  Service: Orthopedics;  Laterality: Left;   CHOLECYSTECTOMY  03/11/2011   Procedure: LAPAROSCOPIC CHOLECYSTECTOMY WITH INTRAOPERATIVE CHOLANGIOGRAM;  Surgeon: Atilano Ina, MD;  Location: [facility] OR;  Service: General;  Laterality: N/A;   CHOLECYSTECTOMY, LAPAROSCOPIC     CYSTOSCOPY/URETEROSCOPY/HOLMIUM LASER/STENT PLACEMENT Left 06/18/2016   Procedure: CYSTOSCOPY/URETEROSCOPY/HOLMIUM LASER/STENT PLACEMENT;  Surgeon: Vanna Scotland, MD;  Location: [facility];  Service: Urology;  Laterality: Left;   ESOPHAGOGASTRODUODENOSCOPY (EGD) WITH PROPOFOL N/A 05/15/2022   Procedure: ESOPHAGOGASTRODUODENOSCOPY (EGD) WITH PROPOFOL;  Surgeon: Wyline Mood, MD;  Location: [facility];  Service: Gastroenterology;  Laterality: N/A;   LAPAROSCOPIC OVARIAN  CYSTECTOMY Right 09/02/2017   Procedure: LAPAROSCOPIC OVARIAN CYSTECTOMY;  Surgeon: Nadara Mustard, MD;  Location: [facility];  Service: Gynecology;  Laterality: Right;   LITHOTRIPSY     x 4   ORIF ANKLE FRACTURE  12/25/2011   Procedure: OPEN REDUCTION INTERNAL FIXATION (ORIF) ANKLE FRACTURE;  Surgeon: Javier Docker, MD;  Location: [facility];  Service: Orthopedics;   Laterality: Left;   TONSILLECTOMY     Social History   Socioeconomic History   Marital status: Married    Spouse name: [name]   Number of children: Not on file   Years of education: Not on file   Highest education level: Not on file  Occupational History   Not on file  Tobacco Use   Smoking status: Every Day    Current packs/day: 0.50    Average packs/day: 0.5 packs/day for 16.7 years (8.3 ttl pk-yrs)    Types: Cigarettes    Start date: 11/03/1999    Last attempt to quit: 09/26/2014   Smokeless tobacco: Never   Tobacco comments:    Patient started smoking again in the past year  Vaping Use   Vaping status: Never Used  Substance and Sexual Activity   Alcohol use: Not Currently    Alcohol/week: 0.0 standard drinks of alcohol   Drug use: No   Sexual activity: Yes    Birth control/protection: Surgical    Comment: Hysterectomy   Other Topics Concern   Not on file  Social History Narrative   Not on file   Social Determinants of Health   Financial Resource Strain: Not on file  Food Insecurity: Not on file  Transportation Needs: Not on file  Physical Activity: Not on file  Stress: Not on file  Social Connections: Not on file  Intimate Partner Violence: Not on file   Family Status  Relation Name Status   Mother  Deceased   Father  Deceased   Sister  Alive   Brother  Alive   Brother  Alive   Brother  Alive   Neg Hx  (Not Specified)  No partnership data on file   Family History  Problem Relation Age of Onset   Diabetes Mother    Diabetes Father    Heart failure Father    Hypertension Father    Pulmonary fibrosis Father        Secondary to amiodarone   Nephrolithiasis Father    Arrhythmia Father        Suspected ventricular tachycardia/fibrillation   Other Brother        Hypermobile joints   Kidney cancer Neg Hx    Bladder Cancer Neg Hx    Allergies  Allergen Reactions   Compazine Other (See Comments)    Dystonia   Metoclopramide Other (See Comments)     dystonia    Patient Care Team: Alfredia Ferguson, PA-C as PCP - General (Physician Assistant) Alba Cory, MD as Attending Physician (Family Medicine) Earline Mayotte, MD (General Surgery)   Medications: Outpatient Medications Prior to Visit  Medication Sig   ALPRAZolam (XANAX) 0.5 MG tablet Take 1 tablet (0.5 mg total) by mouth 2 (two) times daily as needed for anxiety.   amphetamine-dextroamphetamine (ADDERALL) 5 MG tablet Take 1 tablet by mouth daily.   cyclobenzaprine (FLEXERIL) 10 MG tablet Take 10 mg by mouth 3 (three) times daily as needed for muscle spasms.   gabapentin (NEURONTIN) 800 MG tablet Take 800 mg by mouth 3 (three) times daily.   Multiple Vitamin (MULTIVITAMIN WITH MINERALS)  TABS tablet Take 1 tablet by mouth daily.   omeprazole (PRILOSEC) 40 MG capsule Take 1 capsule (40 mg total) by mouth 2 times daily at 12 noon and 4 pm.   Oxycodone HCl 20 MG TABS Take 1 tablet by mouth 4 (four) times daily as needed.   polyethylene glycol (MIRALAX / GLYCOLAX) packet Take 17 g by mouth every other day.    rizatriptan (MAXALT) 10 MG tablet TAKE ONE TABLET AS NEEDED FOR MIGRAINE. MAY REPEAT IN TWO HOURS IF NEEDED   valACYclovir (VALTREX) 500 MG tablet Take 2 tablets (1,000 mg total) by mouth 2 (two) times daily as needed (cold sores).   Vitamin D, Ergocalciferol, (DRISDOL) 1.25 MG (50000 UNIT) CAPS capsule TAKE 1 CAPSULE (50,000 UNITS) BY MOUTH EVERY 7 DAYS   XTAMPZA ER 9 MG C12A Take 1 capsule by mouth 2 (two) times daily.   No facility-administered medications prior to visit.    Review of Systems  Constitutional:  Positive for fatigue. Negative for fever.  Respiratory:  Negative for cough and shortness of breath.   Cardiovascular:  Positive for chest pain and palpitations. Negative for leg swelling.  Gastrointestinal:  Negative for abdominal pain.  Neurological:  Positive for numbness. Negative for dizziness and headaches.      Objective    BP (!) 134/91   Pulse 94    Temp 98.1 F (36.7 C)   Ht 5\' 7"  (1.702 m)   Wt 217 lb 8 oz (98.7 kg)   SpO2 95%   BMI 34.07 kg/m    Physical Exam Constitutional:      General: She is awake.     Appearance: She is well-developed. She is not ill-appearing.  HENT:     Head: Normocephalic.     Right Ear: Tympanic membrane normal.     Left Ear: Tympanic membrane normal.     Nose: Nose normal. No congestion or rhinorrhea.     Mouth/Throat:     Pharynx: No oropharyngeal exudate or posterior oropharyngeal erythema.  Eyes:     Conjunctiva/sclera: Conjunctivae normal.     Pupils: Pupils are equal, round, and reactive to light.  Neck:     Thyroid: No thyroid mass or thyromegaly.  Cardiovascular:     Rate and Rhythm: Normal rate and regular rhythm.     Pulses:          Dorsalis pedis pulses are 3+ on the right side and 3+ on the left side.       Posterior tibial pulses are 2+ on the right side and 2+ on the left side.     Heart sounds: Normal heart sounds.  Pulmonary:     Effort: Pulmonary effort is normal.     Breath sounds: Normal breath sounds.  Abdominal:     Palpations: Abdomen is soft.     Tenderness: There is no abdominal tenderness.  Musculoskeletal:     Right lower leg: No swelling. No edema.     Left lower leg: No swelling. No edema.  Feet:     Right foot:     Protective Sensation: 4 sites tested.  4 sites sensed.     Skin integrity: Skin integrity normal.     Toenail Condition: Right toenails are normal.     Left foot:     Protective Sensation: 4 sites tested.  4 sites sensed.     Skin integrity: Skin integrity normal.     Toenail Condition: Left toenails are normal.     Comments: Patient able  to sense all spots with the monofilament both bilaterally and equally there is dullness to monofilament testing on her plantar surface. Lymphadenopathy:     Cervical: No cervical adenopathy.  Skin:    General: Skin is warm.  Neurological:     Mental Status: She is alert and oriented to person, place, and  time.  Psychiatric:        Attention and Perception: Attention normal.        Mood and Affect: Mood normal.        Speech: Speech normal.        Behavior: Behavior normal. Behavior is cooperative.     Last depression screening scores    01/03/2023    8:47 AM 07/01/2022   10:08 AM 12/26/2021    9:12 AM  PHQ 2/9 Scores  PHQ - 2 Score 1 1 1   PHQ- 9 Score  7 6   Last fall risk screening    01/03/2023    8:47 AM  Fall Risk   Falls in the past year? 1  Number falls in past yr: 1  Injury with Fall? 0  Risk for fall due to : History of fall(s)  Follow up Falls evaluation completed   Last Audit-C alcohol use screening    07/01/2022   10:08 AM  Alcohol Use Disorder Test (AUDIT)  1. How often do you have a drink containing alcohol? 0  2. How many drinks containing alcohol do you have on a typical day when you are drinking? 0  3. How often do you have six or more drinks on one occasion? 0  AUDIT-C Score 0   A score of 3 or more in women, and 4 or more in men indicates increased risk for alcohol abuse, EXCEPT if all of the points are from question 1   No results found for any visits on 01/03/23.  Assessment & Plan    Routine Health Maintenance and Physical Exam  Exercise Activities and Dietary recommendations   --balanced diet high in fiber and protein, low in sugars, carbs, fats. --physical activity/exercise 20-30 minutes 3-5 times a week    Immunization History  Administered Date(s) Administered   Influenza-Unspecified 01/03/2015, 10/14/2017   Td 12/26/2021   Tdap 03/22/2011    Health Maintenance  Topic Date Due   Hepatitis C Screening  Never done   COVID-19 Vaccine (1 - 2023-24 season) Never done   INFLUENZA VACCINE  05/26/2023 (Originally 09/26/2022)   Cervical Cancer Screening (HPV/Pap Cotest)  01/03/2024 (Originally 06/10/2008)   MAMMOGRAM  02/28/2023   DTaP/Tdap/Td (3 - Td or Tdap) 12/27/2031   HIV Screening  Completed   HPV VACCINES  Aged Out    Discussed  health benefits of physical activity, and encouraged her to engage in regular exercise appropriate for her age and condition.  Problem List Items Addressed This Visit       Cardiovascular and Mediastinum   Migraine without aura or status migrainosus    Stable, managed with Maxalt as needed.  Prescribing Zofran as needed for nausea associated with migraines.      Relevant Medications   ondansetron (ZOFRAN) 4 MG tablet   Other Relevant Orders   CBC w/Diff   Vitamin B12   TSH   T4, free   IBC + Ferritin   Raynaud's disease without gangrene    Clinically diagnosing given this sensation in her feet. Foot exam today pulses are normal bilaterally.  On sensation testing she has slightly diminished sensation on both plantar surfaces.  For now advised patient to monitor to avoid her feet getting cold.  If this increases in frequency and severity we can consider referral to vascular.  Raynaud's disease can be a comorbidity of EDS      Relevant Orders   Comp Met (CMET)     Other   Hyperlipidemia    Repeat fasting lipids today      Relevant Orders   Lipid panel   Palpitations    New for patient.  Episodes seem significant.  Ordering ZIO monitor for the next 2 weeks to catch an episode.  Gave patient ED guidelines regarding length or severity of episode.  Will r/o thyroid disease, anemia, electrolyte abn Consider CT cardiac score in the future, she does have mild hyperlipidemia and family history of heart disease.      Relevant Orders   CBC w/Diff   Comp Met (CMET)   TSH   T4, free   IBC + Ferritin   LONG TERM MONITOR (3-14 DAYS)   Vitamin D deficiency    Patient manages with a weekly 5000 units supplement.  Repeat level today.      Relevant Orders   Vitamin D (25 hydroxy)   Elevated blood pressure reading in office without diagnosis of hypertension    Elevated in office today, but patient was having a hot flash.  Will monitor.  She is also smoking again, recommended  smoking cessation.      Tobacco use    Is contemplative, recommended smoking cessation.      Other Visit Diagnoses     Annual physical exam    -  Primary   Paresthesia of both lower extremities       Relevant Orders   Comp Met (CMET)   Vitamin B12   HgB A1c       Pt is not in need of pap smears, s/p tah. Return in about 6 months (around 07/03/2023), or if symptoms worsen or fail to improve, for chronic conditions; will f.u with records.     Alfredia Ferguson, PA-C  [facility] [phone] (phone) [phone] (fax)  [facility]

## 2023-01-03 ENCOUNTER — Ambulatory Visit (INDEPENDENT_AMBULATORY_CARE_PROVIDER_SITE_OTHER): Payer: 59 | Admitting: Physician Assistant

## 2023-01-03 ENCOUNTER — Ambulatory Visit: Payer: 59 | Attending: Physician Assistant

## 2023-01-03 ENCOUNTER — Encounter: Payer: Self-pay | Admitting: Physician Assistant

## 2023-01-03 VITALS — BP 134/91 | HR 94 | Temp 98.1°F | Ht 67.0 in | Wt 217.5 lb

## 2023-01-03 DIAGNOSIS — R202 Paresthesia of skin: Secondary | ICD-10-CM | POA: Diagnosis not present

## 2023-01-03 DIAGNOSIS — R002 Palpitations: Secondary | ICD-10-CM

## 2023-01-03 DIAGNOSIS — G43009 Migraine without aura, not intractable, without status migrainosus: Secondary | ICD-10-CM | POA: Diagnosis not present

## 2023-01-03 DIAGNOSIS — E782 Mixed hyperlipidemia: Secondary | ICD-10-CM | POA: Diagnosis not present

## 2023-01-03 DIAGNOSIS — Z0000 Encounter for general adult medical examination without abnormal findings: Secondary | ICD-10-CM

## 2023-01-03 DIAGNOSIS — E559 Vitamin D deficiency, unspecified: Secondary | ICD-10-CM

## 2023-01-03 DIAGNOSIS — I7300 Raynaud's syndrome without gangrene: Secondary | ICD-10-CM | POA: Diagnosis not present

## 2023-01-03 DIAGNOSIS — R030 Elevated blood-pressure reading, without diagnosis of hypertension: Secondary | ICD-10-CM

## 2023-01-03 DIAGNOSIS — Z720 Tobacco use: Secondary | ICD-10-CM

## 2023-01-03 LAB — COMPREHENSIVE METABOLIC PANEL
ALT: 16 U/L (ref 0–35)
AST: 15 U/L (ref 0–37)
Albumin: 4.3 g/dL (ref 3.5–5.2)
Alkaline Phosphatase: 99 U/L (ref 39–117)
BUN: 7 mg/dL (ref 6–23)
CO2: 31 meq/L (ref 19–32)
Calcium: 9.8 mg/dL (ref 8.4–10.5)
Chloride: 103 meq/L (ref 96–112)
Creatinine, Ser: 0.72 mg/dL (ref 0.40–1.20)
GFR: 101.59 mL/min (ref >60.00)
Glucose, Bld: 86 mg/dL (ref 70–99)
Potassium: 4.4 meq/L (ref 3.5–5.1)
Sodium: 140 meq/L (ref 135–145)
Total Bilirubin: 0.4 mg/dL (ref 0.2–1.2)
Total Protein: 7.1 g/dL (ref 6.0–8.3)

## 2023-01-03 LAB — LIPID PANEL
Cholesterol: 219 mg/dL — ABNORMAL HIGH (ref 0–200)
HDL: 47.4 mg/dL (ref >39.00)
LDL Cholesterol: 138 mg/dL — ABNORMAL HIGH (ref 0–99)
NonHDL: 171.77
Total CHOL/HDL Ratio: 5
Triglycerides: 167 mg/dL — ABNORMAL HIGH (ref 0.0–149.0)
VLDL: 33.4 mg/dL (ref 0.0–40.0)

## 2023-01-03 LAB — IBC + FERRITIN
Ferritin: 86.4 ng/mL (ref 10.0–291.0)
Iron: 95 ug/dL (ref 42–145)
Saturation Ratios: 32.2 % (ref 20.0–50.0)
TIBC: 295.4 ug/dL (ref 250.0–450.0)
Transferrin: 211 mg/dL — ABNORMAL LOW (ref 212.0–360.0)

## 2023-01-03 LAB — CBC WITH DIFFERENTIAL/PLATELET
Basophils Absolute: 0.1 10*3/uL (ref 0.0–0.1)
Basophils Relative: 0.9 % (ref 0.0–3.0)
Eosinophils Absolute: 0.1 10*3/uL (ref 0.0–0.7)
Eosinophils Relative: 1.6 % (ref 0.0–5.0)
HCT: 45 % (ref 36.0–46.0)
Hemoglobin: 15 g/dL (ref 12.0–15.0)
Lymphocytes Relative: 29.7 % (ref 12.0–46.0)
Lymphs Abs: 2.4 10*3/uL (ref 0.7–4.0)
MCHC: 33.4 g/dL (ref 30.0–36.0)
MCV: 93.2 fL (ref 78.0–100.0)
Monocytes Absolute: 0.4 10*3/uL (ref 0.1–1.0)
Monocytes Relative: 5.2 % (ref 3.0–12.0)
Neutro Abs: 5.1 10*3/uL (ref 1.4–7.7)
Neutrophils Relative %: 62.6 % (ref 43.0–77.0)
Platelets: 256 10*3/uL (ref 150.0–400.0)
RBC: 4.83 Mil/uL (ref 3.87–5.11)
RDW: 13.5 % (ref 11.5–15.5)
WBC: 8.1 10*3/uL (ref 4.0–10.5)

## 2023-01-03 LAB — VITAMIN D 25 HYDROXY (VIT D DEFICIENCY, FRACTURES): VITD: 40.82 ng/mL (ref 30.00–100.00)

## 2023-01-03 LAB — HEMOGLOBIN A1C: Hgb A1c MFr Bld: 5.2 % (ref 4.6–6.5)

## 2023-01-03 LAB — T4, FREE: Free T4: 0.83 ng/dL (ref 0.60–1.60)

## 2023-01-03 LAB — VITAMIN B12: Vitamin B-12: 317 pg/mL (ref 211–911)

## 2023-01-03 LAB — TSH: TSH: 0.78 u[IU]/mL (ref 0.35–5.50)

## 2023-01-03 MED ORDER — ONDANSETRON HCL 4 MG PO TABS: 4.0000 mg | ORAL_TABLET | Freq: Three times a day (TID) | ORAL | 1 refills | Status: DC | PRN

## 2023-01-03 NOTE — Assessment & Plan Note (Signed)
Elevated in office today, but patient was having a hot flash.  Will monitor.  She is also smoking again, recommended smoking cessation.

## 2023-01-03 NOTE — Assessment & Plan Note (Addendum)
New for patient.  Episodes seem significant.  Ordering ZIO monitor for the next 2 weeks to catch an episode.  Gave patient ED guidelines regarding length or severity of episode.  Will r/o thyroid disease, anemia, electrolyte abn Consider CT cardiac score in the future, she does have mild hyperlipidemia and family history of heart disease.

## 2023-01-03 NOTE — Assessment & Plan Note (Signed)
Stable, managed with Maxalt as needed.  Prescribing Zofran as needed for nausea associated with migraines.

## 2023-01-03 NOTE — Assessment & Plan Note (Signed)
Clinically diagnosing given this sensation in her feet. Foot exam today pulses are normal bilaterally.  On sensation testing she has slightly diminished sensation on both plantar surfaces.  For now advised patient to monitor to avoid her feet getting cold.  If this increases in frequency and severity we can consider referral to vascular.  Raynaud's disease can be a comorbidity of EDS

## 2023-01-03 NOTE — Progress Notes (Unsigned)
EP to read

## 2023-01-03 NOTE — Assessment & Plan Note (Signed)
Is contemplative, recommended smoking cessation.

## 2023-01-03 NOTE — Assessment & Plan Note (Signed)
Patient manages with a weekly 5000 units supplement.  Repeat level today.

## 2023-01-03 NOTE — Assessment & Plan Note (Signed)
Repeat fasting lipids today. 

## 2023-01-09 DIAGNOSIS — R002 Palpitations: Secondary | ICD-10-CM

## 2023-01-21 ENCOUNTER — Encounter: Payer: Self-pay | Admitting: Physician Assistant

## 2023-01-21 ENCOUNTER — Other Ambulatory Visit: Payer: Self-pay | Admitting: Physician Assistant

## 2023-01-21 DIAGNOSIS — R110 Nausea: Secondary | ICD-10-CM

## 2023-01-21 MED ORDER — SCOPOLAMINE 1 MG/3DAYS TD PT72: 1.0000 | MEDICATED_PATCH | TRANSDERMAL | 0 refills | Status: DC

## 2023-01-25 ENCOUNTER — Other Ambulatory Visit: Payer: Self-pay | Admitting: Physician Assistant

## 2023-01-25 DIAGNOSIS — E559 Vitamin D deficiency, unspecified: Secondary | ICD-10-CM

## 2023-01-27 ENCOUNTER — Encounter: Payer: Self-pay | Admitting: Physician Assistant

## 2023-01-28 ENCOUNTER — Other Ambulatory Visit: Payer: Self-pay | Admitting: Physician Assistant

## 2023-01-28 DIAGNOSIS — R110 Nausea: Secondary | ICD-10-CM

## 2023-01-28 DIAGNOSIS — Z1231 Encounter for screening mammogram for malignant neoplasm of breast: Secondary | ICD-10-CM

## 2023-01-28 MED ORDER — PROMETHAZINE HCL 12.5 MG PO TABS: 12.5000 mg | ORAL_TABLET | Freq: Three times a day (TID) | ORAL | 0 refills | Status: DC | PRN

## 2023-02-06 ENCOUNTER — Other Ambulatory Visit: Payer: Self-pay | Admitting: Family Medicine

## 2023-02-06 DIAGNOSIS — F419 Anxiety disorder, unspecified: Secondary | ICD-10-CM

## 2023-02-06 NOTE — Telephone Encounter (Signed)
Requested medication (s) are due for refill today - yes  Requested medication (s) are on the active medication list -yes  Future visit scheduled -no  Last refill: 11/16/22 #15 1RF  Notes to clinic: non delegated Rx  Requested Prescriptions  Pending Prescriptions Disp Refills   ALPRAZolam (XANAX) 0.5 MG tablet [Pharmacy Med Name: ALPRAZOLAM 0.5MG  TABLET] 15 tablet 1    Sig: TAKE ONE TABLET (0.5 MG TOTAL) BY MOUTH TWO (TWO) TIMES DAILY AS NEEDED FOR ANXIETY.     Not Delegated - Psychiatry: Anxiolytics/Hypnotics 2 Failed - 02/06/2023  8:45 AM      Failed - This refill cannot be delegated      Failed - Urine Drug Screen completed in last 360 days      Failed - Valid encounter within last 6 months    Recent Outpatient Visits           7 months ago Epigastric pain   [facility] Alfredia Ferguson, PA-C   1 year ago Annual physical exam   [facility] Alfredia Ferguson, PA-C   1 year ago Cyst of left ovary   [place] [facility] Alfredia Ferguson, PA-C   1 year ago Avitaminosis D   [facility] [facility] [place], [place], PA-C   1 year ago Sinusitis, unspecified chronicity, unspecified location   [facility] Sherrie Mustache, Demetrios Isaacs, MD       Future Appointments             In 5 months McGowan, Elana Alm [facility] - Patient is not pregnant         Requested Prescriptions  Pending Prescriptions Disp Refills   ALPRAZolam (XANAX) 0.5 MG tablet [Pharmacy Med Name: ALPRAZOLAM 0.5MG  TABLET] 15 tablet 1    Sig: TAKE ONE TABLET (0.5 MG TOTAL) BY MOUTH TWO (TWO) TIMES DAILY AS NEEDED FOR ANXIETY.     Not Delegated - Psychiatry: Anxiolytics/Hypnotics 2 Failed - 02/06/2023  8:45 AM      Failed - This refill cannot be delegated      Failed - Urine Drug Screen completed in last 360 days      Failed - Valid encounter within last 6  months    Recent Outpatient Visits           7 months ago Epigastric pain   [place] [facility] Alfredia Ferguson, PA-C   1 year ago Annual physical exam   [facility] Alfredia Ferguson, PA-C   1 year ago Cyst of left ovary    [facility] Alfredia Ferguson, PA-C   1 year ago Avitaminosis D    [facility] [place], [place], PA-C   1 year ago Sinusitis, unspecified chronicity, unspecified location   [facility] Sherrie Mustache, Demetrios Isaacs, MD       Future Appointments             In 5 months McGowan, Elana Alm [facility] - Patient is not pregnant

## 2023-02-20 ENCOUNTER — Other Ambulatory Visit: Payer: Self-pay | Admitting: Family Medicine

## 2023-02-20 ENCOUNTER — Encounter: Payer: Self-pay | Admitting: Physician Assistant

## 2023-02-20 DIAGNOSIS — F419 Anxiety disorder, unspecified: Secondary | ICD-10-CM

## 2023-02-20 MED ORDER — ALPRAZOLAM 0.5 MG PO TABS: 0.5000 mg | ORAL_TABLET | Freq: Two times a day (BID) | ORAL | 0 refills | Status: DC | PRN

## 2023-02-20 NOTE — Telephone Encounter (Signed)
Sending to DOD

## 2023-03-22 ENCOUNTER — Encounter: Payer: Self-pay | Admitting: Physician Assistant

## 2023-03-25 ENCOUNTER — Other Ambulatory Visit: Payer: Self-pay | Admitting: Physician Assistant

## 2023-03-25 DIAGNOSIS — I7300 Raynaud's syndrome without gangrene: Secondary | ICD-10-CM

## 2023-03-25 DIAGNOSIS — Q796 Ehlers-Danlos syndrome, unspecified: Secondary | ICD-10-CM

## 2023-04-02 ENCOUNTER — Ambulatory Visit
Admission: RE | Admit: 2023-04-02 | Discharge: 2023-04-02 | Disposition: A | Discharge status: Home / Self Care | Payer: 59 | Source: Ambulatory Visit | Attending: Physician Assistant | Admitting: Physician Assistant

## 2023-04-02 DIAGNOSIS — Z1231 Encounter for screening mammogram for malignant neoplasm of breast: Secondary | ICD-10-CM | POA: Insufficient documentation

## 2023-04-04 ENCOUNTER — Encounter: Payer: Self-pay | Admitting: Physician Assistant

## 2023-04-10 ENCOUNTER — Other Ambulatory Visit: Payer: Self-pay | Admitting: Family Medicine

## 2023-04-10 DIAGNOSIS — F419 Anxiety disorder, unspecified: Secondary | ICD-10-CM

## 2023-04-10 MED ORDER — ALPRAZOLAM 0.5 MG PO TABS: 0.5000 mg | ORAL_TABLET | Freq: Two times a day (BID) | ORAL | 0 refills | Status: DC | PRN

## 2023-04-10 NOTE — Telephone Encounter (Signed)
Requesting: alprazolam  Contract: None UDS: None Last Visit: 01/03/23 Next Visit: None Last Refill: 02/20/23 #15 and 0RF   Please Advise

## 2023-05-16 ENCOUNTER — Other Ambulatory Visit: Payer: Self-pay | Admitting: Physician Assistant

## 2023-05-16 DIAGNOSIS — F419 Anxiety disorder, unspecified: Secondary | ICD-10-CM

## 2023-05-16 MED ORDER — ALPRAZOLAM 0.5 MG PO TABS: 0.5000 mg | ORAL_TABLET | Freq: Two times a day (BID) | ORAL | 0 refills | Status: DC | PRN

## 2023-05-16 NOTE — Telephone Encounter (Signed)
 Requesting: alprazolam 0.5mg  Contract: No UDS: no Last Visit: 01/03/2023 Next Visit: Visit date not found Last Refill: 04/10/23  Please Advise

## 2023-06-26 ENCOUNTER — Other Ambulatory Visit: Payer: Self-pay | Admitting: Physician Assistant

## 2023-06-26 DIAGNOSIS — R110 Nausea: Secondary | ICD-10-CM

## 2023-06-26 DIAGNOSIS — F419 Anxiety disorder, unspecified: Secondary | ICD-10-CM

## 2023-06-26 DIAGNOSIS — G43009 Migraine without aura, not intractable, without status migrainosus: Secondary | ICD-10-CM

## 2023-06-26 MED ORDER — ONDANSETRON HCL 4 MG PO TABS: 4.0000 mg | ORAL_TABLET | Freq: Three times a day (TID) | ORAL | 1 refills | Status: DC | PRN

## 2023-06-26 MED ORDER — PROMETHAZINE HCL 12.5 MG PO TABS: 12.5000 mg | ORAL_TABLET | Freq: Three times a day (TID) | ORAL | 0 refills | Status: DC | PRN

## 2023-06-26 MED ORDER — ALPRAZOLAM 0.5 MG PO TABS
0.5000 mg | ORAL_TABLET | Freq: Two times a day (BID) | ORAL | 0 refills | Status: DC | PRN
Start: 2023-06-26 — End: 2023-07-29

## 2023-07-02 ENCOUNTER — Ambulatory Visit: Admitting: Urology

## 2023-07-02 ENCOUNTER — Telehealth: Payer: Self-pay

## 2023-07-02 ENCOUNTER — Ambulatory Visit
Admission: RE | Admit: 2023-07-02 | Discharge: 2023-07-02 | Disposition: A | Discharge status: Home / Self Care | Attending: Urology | Admitting: Urology

## 2023-07-02 ENCOUNTER — Ambulatory Visit
Admission: RE | Admit: 2023-07-02 | Discharge: 2023-07-02 | Disposition: A | Discharge status: Home / Self Care | Source: Ambulatory Visit | Attending: Urology

## 2023-07-02 ENCOUNTER — Encounter: Payer: Self-pay | Admitting: Urology

## 2023-07-02 VITALS — BP 112/75 | HR 102 | Ht 67.0 in | Wt 217.0 lb

## 2023-07-02 DIAGNOSIS — N3281 Overactive bladder: Secondary | ICD-10-CM

## 2023-07-02 DIAGNOSIS — N200 Calculus of kidney: Secondary | ICD-10-CM | POA: Insufficient documentation

## 2023-07-02 DIAGNOSIS — R319 Hematuria, unspecified: Secondary | ICD-10-CM | POA: Diagnosis not present

## 2023-07-02 DIAGNOSIS — N393 Stress incontinence (female) (male): Secondary | ICD-10-CM | POA: Diagnosis not present

## 2023-07-02 LAB — MICROSCOPIC EXAMINATION

## 2023-07-02 LAB — URINALYSIS, COMPLETE
Bilirubin, UA: NEGATIVE
Glucose, UA: NEGATIVE
Ketones, UA: NEGATIVE
Leukocytes,UA: NEGATIVE
Nitrite, UA: NEGATIVE
Protein,UA: NEGATIVE
Specific Gravity, UA: <1.005 — ABNORMAL LOW (ref 1.005–1.030)
Urobilinogen, Ur: 0.2 mg/dL (ref 0.2–1.0)
pH, UA: 6.5 (ref 5.0–7.5)

## 2023-07-02 NOTE — Patient Instructions (Signed)
 Urgent PC office-based treatment for overactive bladder Take back control of your life! Urgent PC is a non-drug, non-surgical option for overactive bladder and associated symptoms of urinary urgency, urinary frequency and urge incontinence  Urgent-PC- Since 2003, healthcare professionals have used the Urgent PC Neuromodulation System as an effective office treatment for men and women suffering from overactive bladder, a condition commonly referred to as OAB. Urgent PC is up to 80% effective, even after conservative measures and OAB drugs have failed. Plus, Urgent PC is very low risk, making it a great choice for people unable or unwilling to have more invasive procedures.  How does Urgent PC work?  The Urgent PC system delivers a specific type of neuromodulation called percutaneous tibial nerve stimulation (PTNS). During treatment, a small, slim needle electrode is inserted near your ankle. The needle electrode is then connected to the battery-powered stimulator. During your 30-minute treatment, mild impulses from the stimulator travel through the needle electrode, along your leg and to the nerves in your pelvis that control bladder function. This process is also referred to as neuromodulation.  What will I feel with Urgent PC therapy?  Because patients may experience the sensation of the Urgent PC therapy in different ways, it's difficult to say what the treatment would feel like to you. Patients often describe the sensation as "tingling" or "pulsating." Treatment is typically well-tolerated by patients. Urgent PC offers many different levels of stimulation, so your clinician will be able to adjust treatment to suit you as well as address any discomfort that you might experience during treatment.  How often will I need Urgent PC treatments? You will receive an initial series of 12 treatments scheduled about a week apart. If you respond, you will likely need a treatment about once per month to  maintain your improvements.  How soon will I see results with Urgent PC? Because Urgent PC gently modifies the signals to achieve bladder control, it usually takes 5-7 weeks for symptoms to change. However, patients respond at different rates. In a review of about 100 patients who had success with Urgent PC, symptoms improved anywhere between 2-12 weeks. For about 20% of these patients, the symptoms of urgency and/or urge incontinence didn't improve until after 8 weeks.1  There is no way to anticipate who will respond earlier, later or not at all. That's why it is important to receive the 12 recommended treatments before you and your physician evaluate whether this therapy is an appropriate and effective choice for you. We will contact your insurance, once we get this approved we can get you scheduled.   How can I receive treatment with Urgent PC? Urgent PC is an option for patients with OAB. If you think you have OAB, talk to your doctor, a urologist or urogynecologist. If you have OAB, the doctor will work with you to determine your own personal treatment plan which usually starts with behavior and diet modifications plus medications. Urgent PC is an excellent option if these options don't work or provide sufficient improvements. Treatment with Urgent PC is typically performed at the office of a urologist, urogynecologist or gynecologist. So, if you run out of options with your normal doctor, consider visiting one of these specialists.  Does insurance cover Urgent PC? Urgent PC treatment is reimbursed by Medicare across the [place] . Private insurance coverage varies by state. To see if your insurance company covers Urgent PC, use our Coverage Finder or talk to your Healthcare Provider.  Are there patients who should  not be treated with Urgent PC? Yes, these include: patients with pacemakers or implantable defibrillators, patients prone to excessive bleeding, patients with nerve damage that  could impact either percutaneous tibial nerve or pelvic floor function and patients who are pregnant or planning to become pregnant during the duration of the treatment.  What are the risks associated with Urgent PC? The risks associated with Urgent PC therapy are low. Most common side-effects are temporary and include mild pain or skin inflammation at or near the stimulation site.

## 2023-07-02 NOTE — Telephone Encounter (Signed)
 No PA is needed per insurance last 4 of reference number [id number] for the call. Co pay would be $60 each visit x 12 weeks.  Patient advised and will have to think about this du to the cost. FYI to [employer]

## 2023-07-02 NOTE — Telephone Encounter (Signed)
-----   Message from [employer] sent at 07/02/2023  9:52 AM EDT ----- Regarding: PTNS She wants to start PTNS.  Will you check with her insurance and get her scheduled if she is covered and she is agreeable to the copayments?

## 2023-07-02 NOTE — Progress Notes (Signed)
 07/02/2023 9:57 AM   [name] Mar 24, 1980 [id number]  Referring provider: Trenton Frock, PA-C [address] [place],  [place] [address]  Urological history: 1. Nephrolithiasis -Stone analysis shows 5% calcium oxalate dihydrate, 45% calcium oxalate monohydrate, and 50% calcium phosphate -ESWL x 4 and URS x 2 -CTU (2024)  -small bilateral stones  2. High risk hematuria -former smoker - work up (2021) -bilateral nephrolithiasis - CTU (2024) -bilateral nephrolithiasis - cysto (2024) - NED  Chief Complaint  Patient presents with   Nephrolithiasis    HPI: [name] is a 43 y.o. woman who presents today burning with urination and left groin pain.  Previous records reviewed.     The dysuria and the left lower quadrant pain have become more milder.  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  She will continue to treat her symptoms conservatively at this time.  Her bothersome issues today are urinary frequency, urgency and some stress urinary incontinence.  She was having episodes of nocturia, but she seems to address this with restricting fluid before bedtime.  She states that when she voids she feels like she voids decent amount but then she feels she has to return right back to the restroom and void small amounts.  She states the feeling is similar to when someone holds their urine for long periods of time before they void.  She does have issues with constipation as well due to her EDS, so she would like to avoid any OAB medications.  Patient denies any modifying or aggravating factors.  Patient denies any recent UTI's, gross hematuria, dysuria or suprapubic/flank pain.  Patient denies any fevers, chills, nausea or vomiting.    UA yellow clear, specific gravity less than 1.005, pH 6.5, trace heme, 0-5 WBCs, 3-10 RBCs, 0-10 epithelial cells and a few bacteria.  KUB 3 stones in the right kidney  with the largest being 5 mm, 3 stones in the left kidney largest being 3 mm   PMH: Past Medical History:  Diagnosis Date   Acute pyelonephritis 06/29/2012   Anxiety    Arthritis    Asthma    AS A CHILD   Complication of anesthesia    TROUBLE URINATING IN THE PAST   Ehlers-Danlos syndrome    Gallstones    GERD (gastroesophageal reflux disease)    History of kidney stones    Kidney stones    bilateral   Migraine    Pelvic pain in female    PONV (postoperative nausea and vomiting)    WITH HYSTERECTOMY ONLY   Pyelonephritis 2015   Renal mass 2017   right. considered a normal cyst on kidney.   Tobacco abuse     Surgical History: Past Surgical History:  Procedure Laterality Date   ABDOMINAL HYSTERECTOMY  2006   ANKLE ARTHROSCOPY Left 2017   ANKLE ARTHROSCOPY Left 11/24/2015   APPENDECTOMY  07/26/13   CAST APPLICATION  12/25/2011   Procedure: CAST APPLICATION;  Surgeon: Loel Ring, MD;  Location: [facility];  Service: Orthopedics;  Laterality: Left;   CHOLECYSTECTOMY  03/11/2011   Procedure: LAPAROSCOPIC CHOLECYSTECTOMY WITH INTRAOPERATIVE CHOLANGIOGRAM;  Surgeon: Fran Imus, MD;  Location: [facility] OR;  Service: General;  Laterality: N/A;   CHOLECYSTECTOMY, LAPAROSCOPIC     CYSTOSCOPY/URETEROSCOPY/HOLMIUM LASER/STENT PLACEMENT Left 06/18/2016   Procedure: CYSTOSCOPY/URETEROSCOPY/HOLMIUM LASER/STENT PLACEMENT;  Surgeon: Dustin Gimenez, MD;  Location: [facility] ORS;  Service: Urology;  Laterality: Left;   ESOPHAGOGASTRODUODENOSCOPY (EGD) WITH PROPOFOL  N/A 05/15/2022   Procedure: ESOPHAGOGASTRODUODENOSCOPY (EGD) WITH PROPOFOL ;  Surgeon: Luke Salaam, MD;  Location: [facility] ENDOSCOPY;  Service: Gastroenterology;  Laterality: N/A;   LAPAROSCOPIC OVARIAN CYSTECTOMY Right 09/02/2017   Procedure: LAPAROSCOPIC OVARIAN CYSTECTOMY;  Surgeon: Alben Alma, MD;  Location: [facility] ORS;  Service: Gynecology;  Laterality: Right;   LITHOTRIPSY     x 4   ORIF ANKLE FRACTURE  12/25/2011   Procedure: OPEN  REDUCTION INTERNAL FIXATION (ORIF) ANKLE FRACTURE;  Surgeon: Loel Ring, MD;  Location: [facility];  Service: Orthopedics;  Laterality: Left;   TONSILLECTOMY      Home Medications:  Allergies as of 07/02/2023       Reactions   Compazine Other (See Comments)   Dystonia   Metoclopramide  Other (See Comments)   dystonia        Medication List        Accurate as of Jul 02, 2023  9:57 AM. If you have any questions, ask your nurse or doctor.          STOP taking these medications    Oxycodone  HCl 20 MG Tabs Stopped by: Jamica Woodyard   scopolamine  1 MG/3DAYS Commonly known as: TRANSDERM-SCOP Stopped by: Graiden Henes   Xtampza  ER 9 MG C12a Generic drug: oxyCODONE  ER Stopped by: Cathleen Coach Daemyn Gariepy       TAKE these medications    ALPRAZolam  0.5 MG tablet Commonly known as: XANAX  Take 1 tablet (0.5 mg total) by mouth 2 (two) times daily as needed for anxiety.   amphetamine-dextroamphetamine 5 MG tablet Commonly known as: ADDERALL Take 1 tablet by mouth daily.   cyclobenzaprine  10 MG tablet Commonly known as: FLEXERIL  Take 10 mg by mouth 3 (three) times daily as needed for muscle spasms.   gabapentin  800 MG tablet Commonly known as: NEURONTIN  Take 800 mg by mouth 3 (three) times daily.   multivitamin with minerals Tabs tablet Take 1 tablet by mouth daily.   omeprazole  40 MG capsule Commonly known as: PRILOSEC Take 1 capsule (40 mg total) by mouth 2 times daily at 12 noon and 4 pm.   ondansetron  4 MG tablet Commonly known as: Zofran  Take 1 tablet (4 mg total) by mouth every 8 (eight) hours as needed for nausea or vomiting.   polyethylene glycol 17 g packet Commonly known as: MIRALAX  / GLYCOLAX  Take 17 g by mouth every other day.   promethazine  12.5 MG tablet Commonly known as: PHENERGAN  Take 1 tablet (12.5 mg total) by mouth every 8 (eight) hours as needed for nausea or vomiting.   rizatriptan  10 MG tablet Commonly known as: MAXALT  TAKE ONE TABLET AS  NEEDED FOR MIGRAINE. MAY REPEAT IN TWO HOURS IF NEEDED   valACYclovir  500 MG tablet Commonly known as: VALTREX  Take 2 tablets (1,000 mg total) by mouth 2 (two) times daily as needed (cold sores).   Vitamin D  (Ergocalciferol ) 1.25 MG (50000 UNIT) Caps capsule Commonly known as: DRISDOL  TAKE ONE CAPSULE BY MOUTH EVERY SEVEN DAYS        Allergies:  Allergies  Allergen Reactions   Compazine Other (See Comments)    Dystonia   Metoclopramide  Other (See Comments)    dystonia    Family History: Family History  Problem Relation Age of Onset   Diabetes Mother    Diabetes Father    Heart failure Father    Hypertension Father    Pulmonary fibrosis Father        Secondary to amiodarone  Nephrolithiasis Father    Arrhythmia Father        Suspected ventricular tachycardia/fibrillation   Other Brother        Hypermobile joints   Kidney cancer Neg Hx    Bladder Cancer Neg Hx     Social History:  reports that she has been smoking cigarettes. She started smoking about 23 years ago. She has a 8.6 pack-year smoking history. She has never used smokeless tobacco. She reports that she does not currently use alcohol. She reports that she does not use drugs.  ROS: Pertinent ROS in HPI  Physical Exam: BP 112/75   Pulse (!) 102   Ht 5\' 7"  (1.702 m)   Wt 217 lb (98.4 kg)   BMI 33.99 kg/m   Constitutional:  Well nourished. Alert and oriented, No acute distress. HEENT: [place] AT, moist mucus membranes.  Trachea midline Cardiovascular: No clubbing, cyanosis, or edema. Respiratory: Normal respiratory effort, no increased work of breathing. Neurologic: Grossly intact, no focal deficits, moving all 4 extremities. Psychiatric: Normal mood and affect.    Laboratory Data: Urinalysis See Epic and HPI. I have reviewed the labs.   Pertinent Imaging: KUB bilateral stones w/ bilateral nephrolithiasis, no findings concerning for ureteral stone I have independently reviewed the films.  See HPI.   Radiologist interpretation still pending  Assessment & Plan:    1. High risk hematuria - Workup last year just noted bilateral nephrolithiasis  2.  Nephrolithiasis - bilateral nephrolithiasis on today's KUB  - Her left lower quadrant pain and dysuria are much more milder today, show she will continue to monitor and manage conservatively  3. OAB - She is not wanting to try OAB medication as she does not want to worsen her issues with constipation - She is very interested in PTNS  - explained the PTNS provides treatment by indirectly providing electrical stimulation to the nerves responsible for bladder and pelvic floor function - a needle electrode generates an adjustable electrical pulse that travels to the sacral plexus via the tibial nerve which is located in the ankle, among other functions, the sacral nerve plexus regulates bladder and pelvic floor function - treatment protocol requires once-a-week treatments for 12 weeks, 30 minutes per session and many patients begin to see improvements by the 6th treatment. Patients who respond to treatment may require occasional treatments (~ once every 3 weeks) to sustain improvements. - PTNS is a low-risk procedure. The most common side-effects with PTNS treatment are temporary and minor, resulting from the placement of the needle electrode. They include minor bleeding, mild pain and skin inflammation and patients have seen up to an 80% success rate with this form of treatment  4. SUI - She will manage it at this time with pads as it occurs when she has URIs - Continue to monitor for worsening symptoms  Return for return pending PTNS PA .  These notes generated with voice recognition software. I apologize for typographical errors.  [name]  [facility] [address]  [address] [place], [place] [address] [phone]

## 2023-07-05 LAB — CULTURE, URINE COMPREHENSIVE

## 2023-07-08 ENCOUNTER — Other Ambulatory Visit: Payer: Self-pay | Admitting: Urology

## 2023-07-08 DIAGNOSIS — R102 Pelvic and perineal pain: Secondary | ICD-10-CM

## 2023-07-08 DIAGNOSIS — N393 Stress incontinence (female) (male): Secondary | ICD-10-CM

## 2023-07-08 DIAGNOSIS — N3281 Overactive bladder: Secondary | ICD-10-CM

## 2023-07-22 ENCOUNTER — Ambulatory Visit: Payer: Self-pay | Admitting: Urology

## 2023-07-29 ENCOUNTER — Other Ambulatory Visit: Payer: Self-pay | Admitting: Physician Assistant

## 2023-07-29 DIAGNOSIS — F419 Anxiety disorder, unspecified: Secondary | ICD-10-CM

## 2023-10-14 ENCOUNTER — Encounter: Payer: Self-pay | Admitting: Physician Assistant

## 2023-10-14 ENCOUNTER — Other Ambulatory Visit: Payer: Self-pay | Admitting: *Deleted

## 2023-10-14 DIAGNOSIS — R110 Nausea: Secondary | ICD-10-CM

## 2023-10-14 DIAGNOSIS — G43009 Migraine without aura, not intractable, without status migrainosus: Secondary | ICD-10-CM

## 2023-10-14 MED ORDER — ONDANSETRON HCL 4 MG PO TABS: 4.0000 mg | ORAL_TABLET | Freq: Three times a day (TID) | ORAL | 1 refills | Status: AC | PRN

## 2023-10-14 MED ORDER — PROMETHAZINE HCL 12.5 MG PO TABS: 12.5000 mg | ORAL_TABLET | Freq: Three times a day (TID) | ORAL | 0 refills | Status: AC | PRN

## 2023-11-04 ENCOUNTER — Other Ambulatory Visit: Payer: Self-pay | Admitting: Physician Assistant

## 2023-11-04 DIAGNOSIS — G43009 Migraine without aura, not intractable, without status migrainosus: Secondary | ICD-10-CM

## 2023-11-04 NOTE — Telephone Encounter (Unsigned)
 Copied from CRM #[id number]. Topic: Clinical - Medication Refill >> Nov 04, 2023  9:24 AM Mesmerise C wrote: Medication:  rizatriptan  (MAXALT ) 10 MG tablet   Has the patient contacted their pharmacy? Yes (Agent: If no, request that the patient contact the pharmacy for the refill. If patient does not wish to contact the pharmacy document the reason why and proceed with request.) (Agent: If yes, when and what did the pharmacy advise?)  This is the patient's preferred pharmacy:  [facility] - [place], [place] - [address] [place] [address] Phone: [phone] Fax: [phone]  Is this the correct pharmacy for this prescription? Yes If no, delete pharmacy and type the correct one.   Has the prescription been filled recently? No  Is the patient out of the medication? Yes  Has the patient been seen for an appointment in the last year OR does the patient have an upcoming appointment? Yes  Can we respond through MyChart? Yes  Agent: Please be advised that Rx refills may take up to 3 business days. We ask that you follow-up with your pharmacy.

## 2023-11-05 MED ORDER — RIZATRIPTAN BENZOATE 10 MG PO TABS
10.0000 mg | ORAL_TABLET | ORAL | 5 refills | Status: AC | PRN
Start: 2023-11-05 — End: ?

## 2024-02-27 ENCOUNTER — Ambulatory Visit: Admitting: Anesthesiology

## 2024-02-27 ENCOUNTER — Encounter
Admission: RE | Disposition: A | Discharge status: Home / Self Care | Payer: Self-pay | Source: Home / Self Care | Attending: Gastroenterology

## 2024-02-27 ENCOUNTER — Other Ambulatory Visit: Payer: Self-pay

## 2024-02-27 ENCOUNTER — Ambulatory Visit
Admission: RE | Admit: 2024-02-27 | Discharge: 2024-02-27 | Disposition: A | Discharge status: Home / Self Care | Attending: Gastroenterology | Admitting: Gastroenterology

## 2024-02-27 ENCOUNTER — Encounter: Payer: Self-pay | Admitting: Gastroenterology

## 2024-02-27 DIAGNOSIS — K219 Gastro-esophageal reflux disease without esophagitis: Secondary | ICD-10-CM | POA: Insufficient documentation

## 2024-02-27 DIAGNOSIS — Z1211 Encounter for screening for malignant neoplasm of colon: Secondary | ICD-10-CM | POA: Diagnosis present

## 2024-02-27 DIAGNOSIS — F17200 Nicotine dependence, unspecified, uncomplicated: Secondary | ICD-10-CM | POA: Diagnosis not present

## 2024-02-27 DIAGNOSIS — M1990 Unspecified osteoarthritis, unspecified site: Secondary | ICD-10-CM | POA: Insufficient documentation

## 2024-02-27 DIAGNOSIS — D122 Benign neoplasm of ascending colon: Secondary | ICD-10-CM | POA: Diagnosis not present

## 2024-02-27 DIAGNOSIS — K5730 Diverticulosis of large intestine without perforation or abscess without bleeding: Secondary | ICD-10-CM | POA: Diagnosis not present

## 2024-02-27 DIAGNOSIS — E66813 Obesity, class 3: Secondary | ICD-10-CM | POA: Insufficient documentation

## 2024-02-27 DIAGNOSIS — D123 Benign neoplasm of transverse colon: Secondary | ICD-10-CM | POA: Insufficient documentation

## 2024-02-27 DIAGNOSIS — Z6832 Body mass index (BMI) 32.0-32.9, adult: Secondary | ICD-10-CM | POA: Diagnosis not present

## 2024-02-27 HISTORY — PX: COLONOSCOPY: SHX5424

## 2024-02-27 HISTORY — PX: POLYPECTOMY: SHX149

## 2024-02-27 SURGERY — COLONOSCOPY
Anesthesia: General

## 2024-02-27 MED ORDER — SODIUM CHLORIDE 0.9 % IV SOLN: INTRAVENOUS | Status: DC

## 2024-02-27 MED ORDER — PROPOFOL 10 MG/ML IV BOLUS
INTRAVENOUS | Status: DC | PRN
  Administered 2024-02-27: 130 ug/kg/min via INTRAVENOUS
  Administered 2024-02-27: 100 mg via INTRAVENOUS

## 2024-02-27 MED ORDER — PROPOFOL 1000 MG/100ML IV EMUL
INTRAVENOUS | Status: AC
  Filled 2024-02-27: qty 100

## 2024-02-27 NOTE — Transfer of Care (Signed)
 Immediate Anesthesia Transfer of Care Note  Patient: [name]  Procedure(s) Performed: COLONOSCOPY POLYPECTOMY, INTESTINE  Patient Location: PACU and Endoscopy Unit  Anesthesia Type:General  Level of Consciousness: awake  Airway & Oxygen Therapy: Patient Spontanous Breathing  Post-op Assessment: Report given to RN  Post vital signs: Reviewed and stable  Last Vitals:  Vitals Value Taken Time  BP 114/83 02/27/24 08:07  Temp    Pulse 96 02/27/24 08:07  Resp 24 02/27/24 08:07  SpO2 100 % 02/27/24 08:07  Vitals shown include unfiled device data.  Last Pain:  Vitals:   02/27/24 0706  TempSrc: Tympanic  PainSc: 0-No pain         Complications: No notable events documented.

## 2024-02-27 NOTE — Op Note (Signed)
 [facility] Gastroenterology Patient Name: [name] Procedure Date: 02/27/2024 7:47 AM MRN: [id number] Account #: [id number] Date of Birth: 1980/08/20 Admit Type: Outpatient Age: 44 Room: [facility] ENDO ROOM 1 Gender: Female Note Status: Finalized Instrument Name: Colon Scope [id number] Procedure:             Colonoscopy Indications:           Screening for colorectal malignant neoplasm Providers:             Ruel Kung MD, MD Referring MD:          No Local Md, MD (Referring MD) Medicines:             Monitored Anesthesia Care Complications:         No immediate complications. Procedure:             Pre-Anesthesia Assessment:                        - Prior to the procedure, a History and Physical was                         performed, and patient medications, allergies and                         sensitivities were reviewed. The patient's tolerance                         of previous anesthesia was reviewed.                        - The risks and benefits of the procedure and the                         sedation options and risks were discussed with the                         patient. All questions were answered and informed                         consent was obtained.                        - ASA Grade Assessment: II - A patient with mild                         systemic disease.                        After obtaining informed consent, the colonoscope was                         passed under direct vision. Throughout the procedure,                         the patient's blood pressure, pulse, and oxygen                         saturations were monitored continuously. The                         Colonoscope  was introduced through the anus and                         advanced to the the cecum, identified by the                         appendiceal orifice. The colonoscopy was performed                         with ease. The patient tolerated the procedure well.                          The quality of the bowel preparation was excellent.                         The ileocecal valve, appendiceal orifice, and rectum                         were photographed. Findings:      The perianal and digital rectal examinations were normal.      Two sessile polyps were found in the transverse colon and ascending       colon. The polyps were 4 to 5 mm in size. These polyps were removed with       a cold snare. Resection and retrieval were complete.      Multiple medium-mouthed diverticula were found in the sigmoid colon.      The exam was otherwise without abnormality on direct and retroflexion       views. Impression:            - Two 4 to 5 mm polyps in the transverse colon and in                         the ascending colon, removed with a cold snare.                         Resected and retrieved.                        - Diverticulosis in the sigmoid colon.                        - The examination was otherwise normal on direct and                         retroflexion views. Recommendation:        - Discharge patient to home (with escort).                        - Resume previous diet.                        - Continue present medications.                        - Await pathology results.                        - Repeat colonoscopy for surveillance based on  pathology results. Procedure Code(s):     --- Professional ---                        [phone], Colonoscopy, flexible; with removal of                         tumor(s), polyp(s), or other lesion(s) by snare                         technique Diagnosis Code(s):     --- Professional ---                        Z12.11, Encounter for screening for malignant neoplasm                         of colon                        D12.3, Benign neoplasm of transverse colon (hepatic                         flexure or splenic flexure)                        D12.2, Benign neoplasm of ascending colon                         K57.30, Diverticulosis of large intestine without                         perforation or abscess without bleeding CPT copyright 2022 American [employer] Association. All rights reserved. The codes documented in this report are preliminary and upon coder review may  be revised to meet current compliance requirements. Ruel Kung, MD Ruel Kung MD, MD 02/27/2024 8:06:32 AM This report has been signed electronically. Number of Addenda: 0 Note Initiated On: 02/27/2024 7:47 AM Scope Withdrawal Time: 0 hours 10 minutes 19 seconds  Total Procedure Duration: 0 hours 12 minutes 41 seconds  Estimated Blood Loss:  Estimated blood loss: none.      [facility]

## 2024-02-27 NOTE — H&P (Signed)
 "                                                                                                                           Ruel Kung , MD [address], [address], [place], [place], [address] Phone: [phone] Fax: [phone]  Primary Care Physician:  [facility]   Pre-Procedure History & Physical: HPI:  [name] is a 44 y.o. female is here for an colonoscopy.   Past Medical History:  Diagnosis Date   Acute pyelonephritis 06/29/2012   Anxiety    Arthritis    Asthma    AS A CHILD   Complication of anesthesia    TROUBLE URINATING IN THE PAST   Ehlers-Danlos syndrome    Gallstones    GERD (gastroesophageal reflux disease)    History of kidney stones    Kidney stones    bilateral   Migraine    Pelvic pain in female    PONV (postoperative nausea and vomiting)    WITH HYSTERECTOMY ONLY   Pyelonephritis 2015   Renal mass 2017   right. considered a normal cyst on kidney.   Tobacco abuse     Past Surgical History:  Procedure Laterality Date   ABDOMINAL HYSTERECTOMY  2006   ANKLE ARTHROSCOPY Left 2017   ANKLE ARTHROSCOPY Left 11/24/2015   APPENDECTOMY  07/26/13   CAST APPLICATION  12/25/2011   Procedure: CAST APPLICATION;  Surgeon: Reyes JAYSON Billing, MD;  Location: [facility];  Service: Orthopedics;  Laterality: Left;   CHOLECYSTECTOMY  03/11/2011   Procedure: LAPAROSCOPIC CHOLECYSTECTOMY WITH INTRAOPERATIVE CHOLANGIOGRAM;  Surgeon: Camellia CHRISTELLA Blush, MD;  Location: [facility] OR;  Service: General;  Laterality: N/A;   CHOLECYSTECTOMY, LAPAROSCOPIC     CYSTOSCOPY/URETEROSCOPY/HOLMIUM LASER/STENT PLACEMENT Left 06/18/2016   Procedure: CYSTOSCOPY/URETEROSCOPY/HOLMIUM LASER/STENT PLACEMENT;  Surgeon: Rosina Riis, MD;  Location: [facility] ORS;  Service: Urology;  Laterality: Left;   ESOPHAGOGASTRODUODENOSCOPY (EGD) WITH PROPOFOL  N/A 05/15/2022   Procedure: ESOPHAGOGASTRODUODENOSCOPY (EGD) WITH PROPOFOL ;  Surgeon: Kung Ruel, MD;  Location: [facility] ENDOSCOPY;  Service:  Gastroenterology;  Laterality: N/A;   LAPAROSCOPIC OVARIAN CYSTECTOMY Right 09/02/2017   Procedure: LAPAROSCOPIC OVARIAN CYSTECTOMY;  Surgeon: Arloa Lamar SQUIBB, MD;  Location: [facility] ORS;  Service: Gynecology;  Laterality: Right;   LITHOTRIPSY     x 4   ORIF ANKLE FRACTURE  12/25/2011   Procedure: OPEN REDUCTION INTERNAL FIXATION (ORIF) ANKLE FRACTURE;  Surgeon: Reyes JAYSON Billing, MD;  Location: [facility];  Service: Orthopedics;  Laterality: Left;   TONSILLECTOMY      Prior to Admission medications  Medication Sig Start Date End Date Taking? Authorizing Provider  pantoprazole  (PROTONIX ) 40 MG tablet Take 40 mg by mouth daily.   Yes [provider]  ALPRAZolam  (XANAX ) 0.5 MG tablet TAKE ONE TABLET (0.5 MG TOTAL) BY MOUTH TWO TIMES DAILY AS NEEDED FOR ANXIETY. 07/29/23   Cyndi Shaver, PA-C  amphetamine-dextroamphetamine (ADDERALL) 5 MG tablet Take 1 tablet by mouth daily. 09/19/22   [provider]  cyclobenzaprine  (FLEXERIL ) 10 MG tablet Take 10 mg by mouth 3 (three) times daily as needed for muscle spasms.    [provider]  gabapentin  (NEURONTIN ) 800 MG tablet Take 800 mg by mouth 3 (three) times daily.    [provider]  Multiple Vitamin (MULTIVITAMIN WITH MINERALS) TABS tablet Take 1 tablet by mouth daily.    [provider]  omeprazole  (PRILOSEC) 40 MG capsule Take 1 capsule (40 mg total) by mouth 2 times daily at 12 noon and 4 pm. Patient not taking: Reported on 02/27/2024 12/04/22   Therisa Bi, MD  ondansetron  (ZOFRAN ) 4 MG tablet Take 1 tablet (4 mg total) by mouth every 8 (eight) hours as needed for nausea or vomiting. 10/14/23   Cyndi Shaver, PA-C  polyethylene glycol (MIRALAX  / GLYCOLAX ) packet Take 17 g by mouth every other day.     [provider]  promethazine  (PHENERGAN ) 12.5 MG tablet Take 1 tablet (12.5 mg total) by mouth every 8 (eight) hours as needed for nausea or vomiting. 10/14/23   Drubel, Shaver, PA-C  rizatriptan  (MAXALT )  10 MG tablet Take 1 tablet (10 mg total) by mouth as needed for migraine. May repeat in 2 hours if needed 11/05/23   Webb, Padonda B, FNP  valACYclovir  (VALTREX ) 500 MG tablet Take 2 tablets (1,000 mg total) by mouth 2 (two) times daily as needed (cold sores). 03/11/18   Vivienne Delon HERO, PA-C  Vitamin D , Ergocalciferol , (DRISDOL ) 1.25 MG (50000 UNIT) CAPS capsule TAKE ONE CAPSULE BY MOUTH EVERY SEVEN DAYS 01/28/23   Cyndi Shaver, PA-C    Allergies as of 02/25/2024 - Review Complete 07/02/2023  Allergen Reaction Noted   Compazine Other (See Comments) 01/19/2011   Metoclopramide  Other (See Comments)     Family History  Problem Relation Age of Onset   Diabetes Mother    Diabetes Father    Heart failure Father    Hypertension Father    Pulmonary fibrosis Father        Secondary to amiodarone   Nephrolithiasis Father    Arrhythmia Father        Suspected ventricular tachycardia/fibrillation   Other Brother        Hypermobile joints   Kidney cancer Neg Hx    Bladder Cancer Neg Hx     Social History   Socioeconomic History   Marital status: Married    Spouse name: Gustavo   Number of children: Not on file   Years of education: Not on file   Highest education level: Not on file  Occupational History   Not on file  Tobacco Use   Smoking status: Every Day    Current packs/day: 0.50    Average packs/day: 0.5 packs/day for 17.8 years (8.9 ttl pk-yrs)    Types: Cigarettes    Start date: 11/03/1999    Last attempt to quit: 09/26/2014   Smokeless tobacco: Never   Tobacco comments:    Patient started smoking again in the past year  Vaping Use   Vaping status: Never Used  Substance and Sexual Activity   Alcohol use: Not Currently    Alcohol/week: 0.0 standard drinks of alcohol   Drug use: No   Sexual activity: Yes    Birth control/protection: Surgical    Comment: Hysterectomy   Other Topics Concern   Not on file  Social History Narrative   Not on file   Social Drivers of  Health   Tobacco Use: High Risk (09/01/2023)   Received  from [facility]   Patient History    Smoking Tobacco Use: Every Day    Smokeless Tobacco Use: Never    Passive Exposure: Not on file  Financial Resource Strain: Low Risk  (02/24/2024)   [facility]   Overall Financial Resource Strain (CARDIA)    Difficulty of Paying Living Expenses: Not hard at all  Food Insecurity: No Food Insecurity (02/24/2024)   Received from [facility]   Epic    Within the past 12 months, you worried that your food would run out before you got the money to buy more.: Never true    Within the past 12 months, the food you bought just didn't last and you didn't have money to get more.: Never true  Transportation Needs: No Transportation Needs (02/24/2024)   Received from [facility] - Transportation    In the past 12 months, has lack of transportation kept you from medical appointments or from getting medications?: No    Lack of Transportation (Non-Medical): No  Physical Activity: Not on file  Stress: Not on file  Social Connections: Not on file  Intimate Partner Violence: Not on file  Depression (PHQ2-9): Low Risk (01/03/2023)   Depression (PHQ2-9)    PHQ-2 Score: 1  Alcohol Screen: Low Risk (07/01/2022)   Alcohol Screen    Last Alcohol Screening Score (AUDIT): 0  Housing: Low Risk  (02/24/2024)   Received from [facility]   Epic    In the last 12 months, was there a time when you were not able to pay the mortgage or rent on time?: No    In the past 12 months, how many times have you moved where you were living?: 0    At any time in the past 12 months, were you homeless or living in a shelter (including now)?: No  Utilities: Not At Risk (02/24/2024)   Received from [facility]   Epic    In the past 12 months has the electric, gas, oil, or water company threatened to shut off services in  your home?: No  Health Literacy: Not on file    Review of Systems: See HPI, otherwise negative ROS  Physical Exam: BP 127/80   Pulse 86   Temp (!) 96.4 F (35.8 C) (Tympanic)   Resp 20   Ht 5' 7 (1.702 m)   Wt 95.1 kg   SpO2 100%   BMI 32.83 kg/m  General:   Alert,  pleasant and cooperative in NAD Head:  Normocephalic and atraumatic. Neck:  Supple; no masses or thyromegaly. Lungs:  Clear throughout to auscultation, normal respiratory effort.    Heart:  +S1, +S2, Regular rate and rhythm, No edema. Abdomen:  Soft, nontender and nondistended. Normal bowel sounds, without guarding, and without rebound.   Neurologic:  Alert and  oriented x4;  grossly normal neurologically.  Impression/Plan: [name] is here for an colonoscopy to be performed for Screening colonoscopy average risk   Risks, benefits, limitations, and alternatives regarding  colonoscopy have been reviewed with the patient.  Questions have been answered.  All parties agreeable.   Ruel Kung, MD  02/27/2024, 7:46 AM  "

## 2024-02-27 NOTE — Anesthesia Preprocedure Evaluation (Signed)
 "                                  Anesthesia Evaluation  Patient identified by MRN, date of birth, ID band Patient awake    Reviewed: Allergy & Precautions, NPO status , Patient's Chart, lab work & pertinent test results  History of Anesthesia Complications (+) PONV and history of anesthetic complications  Airway Mallampati: II  TM Distance: >3 FB Neck ROM: Full    Dental  (+) Teeth Intact   Pulmonary neg pulmonary ROS, Current Smoker and Patient abstained from smoking.   Pulmonary exam normal  + decreased breath sounds      Cardiovascular Exercise Tolerance: Good negative cardio ROS Normal cardiovascular exam Rhythm:Regular Rate:Normal     Neuro/Psych   Anxiety     negative neurological ROS  negative psych ROS   GI/Hepatic negative GI ROS, Neg liver ROS,GERD  Medicated,,  Endo/Other  negative endocrine ROS  Class 3 obesity  Renal/GU negative Renal ROS  negative genitourinary   Musculoskeletal  (+) Arthritis ,    Abdominal  (+) + obese  Peds negative pediatric ROS (+)  Hematology negative hematology ROS (+)   Anesthesia Other Findings Past Medical History: 06/29/2012: Acute pyelonephritis No date: Anxiety No date: Arthritis No date: Asthma     Comment:  AS A CHILD No date: Complication of anesthesia     Comment:  TROUBLE URINATING IN THE PAST No date: Ehlers-Danlos syndrome No date: Gallstones No date: GERD (gastroesophageal reflux disease) No date: History of kidney stones No date: Kidney stones     Comment:  bilateral No date: Migraine No date: Pelvic pain in female No date: PONV (postoperative nausea and vomiting)     Comment:  WITH HYSTERECTOMY ONLY 2015: Pyelonephritis 2017: Renal mass     Comment:  right. considered a normal cyst on kidney. No date: Tobacco abuse  Past Surgical History: 2006: ABDOMINAL HYSTERECTOMY 2017: ANKLE ARTHROSCOPY; Left 11/24/2015: ANKLE ARTHROSCOPY; Left 07/26/13: APPENDECTOMY 12/25/2011: CAST  APPLICATION     Comment:  Procedure: CAST APPLICATION;  Surgeon: Reyes JAYSON Billing,               MD;  Location: [facility];  Service: Orthopedics;                Laterality: Left; 03/11/2011: CHOLECYSTECTOMY     Comment:  Procedure: LAPAROSCOPIC CHOLECYSTECTOMY WITH               INTRAOPERATIVE CHOLANGIOGRAM;  Surgeon: Camellia CHRISTELLA Blush,               MD;  Location: [facility] OR;  Service: General;  Laterality:               N/A; No date: CHOLECYSTECTOMY, LAPAROSCOPIC 06/18/2016: CYSTOSCOPY/URETEROSCOPY/HOLMIUM LASER/STENT PLACEMENT; Left     Comment:  Procedure: CYSTOSCOPY/URETEROSCOPY/HOLMIUM LASER/STENT               PLACEMENT;  Surgeon: Rosina Riis, MD;  Location: [facility]               ORS;  Service: Urology;  Laterality: Left; 05/15/2022: ESOPHAGOGASTRODUODENOSCOPY (EGD) WITH PROPOFOL ; N/A     Comment:  Procedure: ESOPHAGOGASTRODUODENOSCOPY (EGD) WITH               PROPOFOL ;  Surgeon: Therisa Bi, MD;  Location: [facility]               ENDOSCOPY;  Service: Gastroenterology;  Laterality: N/A; 09/02/2017: LAPAROSCOPIC OVARIAN CYSTECTOMY; Right     Comment:  Procedure: LAPAROSCOPIC OVARIAN CYSTECTOMY;  Surgeon:               Arloa Lamar SQUIBB, MD;  Location: [facility] ORS;  Service:               Gynecology;  Laterality: Right; No date: LITHOTRIPSY     Comment:  x 4 12/25/2011: ORIF ANKLE FRACTURE     Comment:  Procedure: OPEN REDUCTION INTERNAL FIXATION (ORIF) ANKLE              FRACTURE;  Surgeon: Reyes JAYSON Billing, MD;  Location: [facility]               ORS;  Service: Orthopedics;  Laterality: Left; No date: TONSILLECTOMY  BMI    Body Mass Index: 32.83 kg/m      Reproductive/Obstetrics negative OB ROS                              Anesthesia Physical Anesthesia Plan  ASA: 3  Anesthesia Plan: General   Post-op Pain Management:    Induction: Intravenous  PONV Risk Score and Plan: Propofol  infusion and TIVA  Airway Management Planned: Natural Airway and Nasal Cannula  Additional  Equipment:   Intra-op Plan:   Post-operative Plan:   Informed Consent: I have reviewed the patients History and Physical, chart, labs and discussed the procedure including the risks, benefits and alternatives for the proposed anesthesia with the patient or authorized representative who has indicated his/her understanding and acceptance.     Dental Advisory Given  Plan Discussed with: CRNA  Anesthesia Plan Comments:         Anesthesia Quick Evaluation  "

## 2024-02-27 NOTE — Anesthesia Postprocedure Evaluation (Signed)
"   Anesthesia Post Note  Patient: [name]  Procedure(s) Performed: COLONOSCOPY POLYPECTOMY, INTESTINE  Patient location during evaluation: PACU Anesthesia Type: General Level of consciousness: awake Pain management: pain level controlled Vital Signs Assessment: post-procedure vital signs reviewed and stable Respiratory status: spontaneous breathing Cardiovascular status: stable Anesthetic complications: no   No notable events documented.   Last Vitals:  Vitals:   02/27/24 0817 02/27/24 0827  BP: 109/71 103/72  Pulse: 89 90  Resp: 17 19  Temp:    SpO2: 100% 99%    Last Pain:  Vitals:   02/27/24 0827  TempSrc:   PainSc: 0-No pain                 VAN STAVEREN,Avari Gelles      "

## 2024-03-01 LAB — SURGICAL PATHOLOGY

## 2024-03-03 ENCOUNTER — Ambulatory Visit: Payer: Self-pay | Admitting: Gastroenterology
